# Patient Record
Sex: Female | Born: 1945 | Race: White | Hispanic: No | Marital: Married | Smoking: Former smoker
Health system: Southern US, Community
[De-identification: ages and names within clinical notes are randomized; demographics above are authoritative.]

## PROBLEM LIST (undated history)

## (undated) DIAGNOSIS — Z8639 Personal history of other endocrine, nutritional and metabolic disease: Secondary | ICD-10-CM

## (undated) DIAGNOSIS — N289 Disorder of kidney and ureter, unspecified: Secondary | ICD-10-CM

## (undated) DIAGNOSIS — I219 Acute myocardial infarction, unspecified: Secondary | ICD-10-CM

## (undated) DIAGNOSIS — E119 Type 2 diabetes mellitus without complications: Secondary | ICD-10-CM

## (undated) DIAGNOSIS — D649 Anemia, unspecified: Secondary | ICD-10-CM

## (undated) DIAGNOSIS — J45909 Unspecified asthma, uncomplicated: Secondary | ICD-10-CM

## (undated) DIAGNOSIS — I5022 Chronic systolic (congestive) heart failure: Secondary | ICD-10-CM

## (undated) DIAGNOSIS — Z8679 Personal history of other diseases of the circulatory system: Secondary | ICD-10-CM

## (undated) DIAGNOSIS — I2510 Atherosclerotic heart disease of native coronary artery without angina pectoris: Secondary | ICD-10-CM

## (undated) DIAGNOSIS — Z860100 Personal history of colon polyps, unspecified: Secondary | ICD-10-CM

## (undated) DIAGNOSIS — K069 Disorder of gingiva and edentulous alveolar ridge, unspecified: Secondary | ICD-10-CM

## (undated) DIAGNOSIS — I5042 Chronic combined systolic (congestive) and diastolic (congestive) heart failure: Secondary | ICD-10-CM

## (undated) DIAGNOSIS — K219 Gastro-esophageal reflux disease without esophagitis: Secondary | ICD-10-CM

## (undated) DIAGNOSIS — I255 Ischemic cardiomyopathy: Secondary | ICD-10-CM

## (undated) DIAGNOSIS — I10 Essential (primary) hypertension: Secondary | ICD-10-CM

## (undated) DIAGNOSIS — I739 Peripheral vascular disease, unspecified: Secondary | ICD-10-CM

## (undated) DIAGNOSIS — E785 Hyperlipidemia, unspecified: Secondary | ICD-10-CM

## (undated) DIAGNOSIS — E039 Hypothyroidism, unspecified: Secondary | ICD-10-CM

## (undated) HISTORY — DX: Anemia, unspecified: D64.9

## (undated) HISTORY — PX: PARATHYROIDECTOMY: SHX19

## (undated) HISTORY — DX: Chronic systolic (congestive) heart failure: I50.22

## (undated) HISTORY — DX: Personal history of colon polyps, unspecified: Z86.0100

## (undated) HISTORY — DX: Personal history of other endocrine, nutritional and metabolic disease: Z86.39

## (undated) HISTORY — DX: Peripheral vascular disease, unspecified: I73.9

## (undated) HISTORY — PX: TONSILLECTOMY: SUR1361

## (undated) HISTORY — PX: CARDIAC CATHETERIZATION: SHX172

## (undated) HISTORY — DX: Hyperlipidemia, unspecified: E78.5

## (undated) HISTORY — DX: Personal history of colonic polyps: Z86.010

## (undated) HISTORY — DX: Atherosclerotic heart disease of native coronary artery without angina pectoris: I25.10

## (undated) HISTORY — DX: Acute myocardial infarction, unspecified: I21.9

## (undated) HISTORY — DX: Gastro-esophageal reflux disease without esophagitis: K21.9

## (undated) HISTORY — DX: Disorder of gingiva and edentulous alveolar ridge, unspecified: K06.9

## (undated) HISTORY — PX: THYROIDECTOMY: SHX17

## (undated) HISTORY — DX: Essential (primary) hypertension: I10

## (undated) HISTORY — DX: Personal history of other diseases of the circulatory system: Z86.79

## (undated) HISTORY — PX: CORONARY ARTERY BYPASS GRAFT: SHX141

## (undated) HISTORY — DX: Type 2 diabetes mellitus without complications: E11.9

## (undated) HISTORY — DX: Hypothyroidism, unspecified: E03.9

## (undated) HISTORY — PX: CHOLECYSTECTOMY: SHX55

---

## 2016-03-04 ENCOUNTER — Encounter: Payer: Self-pay | Admitting: Cardiology

## 2016-03-04 ENCOUNTER — Telehealth: Payer: Self-pay | Admitting: Cardiology

## 2016-03-04 ENCOUNTER — Ambulatory Visit (INDEPENDENT_AMBULATORY_CARE_PROVIDER_SITE_OTHER): Payer: Medicare Other | Admitting: Cardiology

## 2016-03-04 ENCOUNTER — Other Ambulatory Visit: Payer: Self-pay | Admitting: Cardiology

## 2016-03-04 VITALS — BP 92/62 | HR 109 | Ht 63.0 in | Wt 172.0 lb

## 2016-03-04 DIAGNOSIS — I4819 Other persistent atrial fibrillation: Secondary | ICD-10-CM

## 2016-03-04 DIAGNOSIS — I739 Peripheral vascular disease, unspecified: Secondary | ICD-10-CM

## 2016-03-04 DIAGNOSIS — I2510 Atherosclerotic heart disease of native coronary artery without angina pectoris: Secondary | ICD-10-CM

## 2016-03-04 DIAGNOSIS — I481 Persistent atrial fibrillation: Secondary | ICD-10-CM

## 2016-03-04 DIAGNOSIS — I255 Ischemic cardiomyopathy: Secondary | ICD-10-CM | POA: Diagnosis not present

## 2016-03-04 DIAGNOSIS — I959 Hypotension, unspecified: Secondary | ICD-10-CM

## 2016-03-04 MED ORDER — AMIODARONE HCL 200 MG PO TABS: ORAL_TABLET | ORAL | 3 refills | Status: DC

## 2016-03-04 NOTE — Telephone Encounter (Signed)
Concerned that side effect was blindness,Dr Diona BrownerMcDowell said it was very remote.Pt agreed to take medication

## 2016-03-04 NOTE — Patient Instructions (Signed)
Your physician recommends that you schedule a follow-up appointment in: 3 weeks with Dr Diona BrownerMcDowell   STOP Norvasc   STOP Aspirin   START Amiodarone  200 mg twice a day for 2 weeks, THEN DECREASE to 200 mg daily      Your physician has requested that you have an echocardiogram. Echocardiography is a painless test that uses sound waves to create images of your heart. It provides your doctor with information about the size and shape of your heart and how well your heart's chambers and valves are working. This procedure takes approximately one hour. There are no restrictions for this procedure.     Thank you for choosing [facility] !

## 2016-03-04 NOTE — Progress Notes (Signed)
Cardiology Office Note  Date: 03/04/2016   ID: [name], DOB Dec 08, 1945, MRN [id number]  PCP: Juliette Alcide, MD  Consulting Cardiologist: Nona Dell, MD   Chief Complaint  Patient presents with  . Palpitations    History of Present Illness: [name] is a medically complex 70 y.o. female referred for cardiology consultation by Dr. Leandrew Koyanagi. She recently established care with him in late July having moved to [place] from [place] to be with family. I do not have complete cardiac records.  She states that over the last 4 or 5 days she has been experiencing palpitations in the mornings and has also noticed that her heart rate is faster than normal, generally around 90-100 range. She reports compliance with her medications. Most recent cardiac regimen includes Norvasc, Coreg, Lasix, and Eliquis. She states that her blood pressure typically runs on the low side.  She has a history of multivessel CAD status post CABG in 2003 after myocardial infarction, and what sounds like an ischemic cardiomyopathy that has been present for years. I do not have information about LVEF at this point. She tells me that she was evaluated for atrial flutter last October at which time she was successfully cardioverted. There was also some discussion about defibrillator placement, but she did not pursue this. CHADSVASC score is 6.  I reviewed her ECG today which shows atrial fibrillation at 102 bpm with PVCs versus aberrantly conducted complexes, also probable lateral infarct pattern and nonspecific ST-T wave changes.  Past Medical History:  Diagnosis Date  . CAD (coronary artery disease)    Multivessel status post CABG in 2003  . Chronic systolic heart failure (HCC)   . Essential hypertension   . GERD (gastroesophageal reflux disease)   . Gum disease   . History of atrial flutter    Cardioversion 2016 in [place]  . History of colonic polyps   . History of goiter   .  Hyperlipidemia   . Hypothyroidism   . Myocardial infarction (HCC)    2003  . Peripheral arterial disease (HCC)    Stent revascularization 2015 - details not clear  . Type 2 diabetes mellitus (HCC)     Past Surgical History:  Procedure Laterality Date  . CHOLECYSTECTOMY     2012  . CORONARY ARTERY BYPASS GRAFT     2003  . PARATHYROIDECTOMY     2015  . THYROIDECTOMY    . TONSILLECTOMY     1967    Current Outpatient Prescriptions  Medication Sig Dispense Refill  . apixaban (ELIQUIS) 5 MG TABS tablet Take 5 mg by mouth 2 (two) times daily.     . carvedilol (COREG) 12.5 MG tablet Take 12.5 mg by mouth 2 (two) times daily with a meal.    . furosemide (LASIX) 20 MG tablet Take 20 mg by mouth daily.     Marland Kitchen levothyroxine (SYNTHROID, LEVOTHROID) 88 MCG tablet Take 88 mcg by mouth daily before breakfast.     . omeprazole (PRILOSEC) 40 MG capsule Take 40 mg by mouth daily.     Marland Kitchen amiodarone (PACERONE) 200 MG tablet Take 200 mg twice a day for 2 weeks, THEN DECREASE to 200 mg daily 90 tablet 3  . nitroGLYCERIN (NITRODUR - DOSED IN MG/24 HR) 0.2 mg/hr patch nitroglycerin 0.2 mg/hr transdermal 24 hour patch transdermal     No current facility-administered medications for this visit.    Allergies:  Oxycodone and Pollen extract   Social History: The patient  reports  that she has quit smoking. Her smoking use included Cigarettes. She has never used smokeless tobacco. She reports that she does not drink alcohol or use drugs.   Family History: The patient's family history includes Heart attack in her father; Stroke in her mother.   ROS:  Please see the history of present illness. Otherwise, complete review of systems is positive for history of claudication, none recently.  All other systems are reviewed and negative.   Physical Exam: VS:  BP 92/62 (BP Location: Left Arm)   Pulse (!) 109   Ht 5\' 3"  (1.6 m)   Wt 172 lb (78 kg)   SpO2 97%   BMI 30.47 kg/m , BMI Body mass index is 30.47  kg/m.  Wt Readings from Last 3 Encounters:  03/04/16 172 lb (78 kg)    General: Overweight woman, appears comfortable at rest. HEENT: Conjunctiva and lids normal, oropharynx clear. Neck: Supple, no elevated JVP or carotid bruits, no thyromegaly. Lungs: Clear to auscultation, nonlabored breathing at rest. Cardiac: Irregularly irregular, no S3, soft systolic murmur, no pericardial rub. Abdomen: Soft, nontender, bowel sounds present, no guarding or rebound. Extremities: No pitting edema, distal pulses 1-2+. Skin: Warm and dry. Musculoskeletal: No kyphosis. Neuropsychiatric: Alert and oriented x3, affect grossly appropriate.  ECG: There is no old tracing for comparison.  Assessment and Plan:  1. Persistent atrial fibrillation, onset uncertain and potentially paroxysmal with symptoms over the last 4 or 5 days. She has a prior history of atrial flutter with cardioversion in October 2016 while in [place]. CHADSVASC score is 6 and she is on Eliquis. We discussed a number of treatment options, and at this point plan will be to initiate amiodarone at 200 mg twice daily for 2 week load and then reduce to once daily. If this does not stabilize her rhythm and atrial fibrillation persists, we can always follow-up with an elective cardioversion. We discussed potential side effects and plan for follow-up down the road. We will also request recent lab work from Dr. Leandrew KoyanagiBurdine. Also asked her to stop aspirin.  2. Multivessel CAD status post CABG in 2003. I do not have details. Patient states that she had records from [place] requested through Dr. Cato MulliganBurdine's office. We will see if these can be obtained.  3. Ischemic cardiomyopathy, not certain about LVEF at this time. Since ICD had been discussed with her previously, I presume that her LVEF was at least in the 35% range. We will obtain an echocardiogram.  4. Hypotension, asymptomatic today. This will likely interfere with efforts to control her rhythm and  augment therapy for her ischemic cardiomyopathy. Plan to stop Norvasc at this point.  5. PAD status post prior reported stent intervention on the left leg. I do not have details. Hopefully this will be in the records obtained from Dr. Leandrew KoyanagiBurdine. We will eventually need to consider lower extremity ABIs and Dopplers.  Current medicines were reviewed with the patient today.   Orders Placed This Encounter  Procedures  . EKG 12-Lead  . ECHOCARDIOGRAM COMPLETE    Disposition: Follow-up with me in 3 weeks.  Signed, Jonelle SidleSamuel G. McDowell, MD, [facility] 03/04/2016 10:20 AM    [facility] at [facility] [address], [place], [place] [address] Phone: [phone]) [phone]; Fax: [phone]

## 2016-03-04 NOTE — Telephone Encounter (Signed)
Mrs. Gasaway is calling because she has some questions and concerns about the medication Amiodarone that was just prescribe for her . Please call   Thanks

## 2016-03-05 ENCOUNTER — Telehealth: Payer: Self-pay

## 2016-03-05 NOTE — Telephone Encounter (Signed)
I called patient to tell her we received her records from [place] and she stated she has not taken Amiodarone. Last nightshe felt she is back in sinus rhythm HR 60's and regular BP normal. Do you need patient to come in for EKG ?

## 2016-03-07 NOTE — Telephone Encounter (Signed)
I would have her do a 7 day monitor to see how frequently she is going in and out of AF. This might help us further decide about antiarrhythmic therapy.

## 2016-03-08 ENCOUNTER — Other Ambulatory Visit: Payer: Self-pay

## 2016-03-08 DIAGNOSIS — I4891 Unspecified atrial fibrillation: Secondary | ICD-10-CM

## 2016-03-08 NOTE — Telephone Encounter (Signed)
I placed order for 7 day event, for afib.I will have [place] enter in e-cardio and call pt

## 2016-03-11 ENCOUNTER — Ambulatory Visit: Payer: Self-pay | Admitting: Cardiology

## 2016-03-15 ENCOUNTER — Telehealth: Payer: Self-pay | Admitting: Cardiology

## 2016-03-15 NOTE — Telephone Encounter (Signed)
Pt called and lvm stating she's received a heart monitor in the mail and she doesn't remember being told anything about having to wear one.

## 2016-03-15 NOTE — Telephone Encounter (Signed)
See previous notes,pt was to wear 7 day event monitor,I was unable to reach her but left voicemail to reassure this is what Dr Diona BrownerMcDowell needs

## 2016-03-16 ENCOUNTER — Ambulatory Visit (HOSPITAL_COMMUNITY)
Admission: RE | Admit: 2016-03-16 | Discharge: 2016-03-16 | Disposition: A | Discharge status: Home / Self Care | Payer: Medicare Other | Source: Ambulatory Visit | Attending: Cardiology | Admitting: Cardiology

## 2016-03-16 ENCOUNTER — Ambulatory Visit (INDEPENDENT_AMBULATORY_CARE_PROVIDER_SITE_OTHER): Payer: Medicare Other

## 2016-03-16 DIAGNOSIS — I252 Old myocardial infarction: Secondary | ICD-10-CM | POA: Diagnosis not present

## 2016-03-16 DIAGNOSIS — I255 Ischemic cardiomyopathy: Secondary | ICD-10-CM | POA: Insufficient documentation

## 2016-03-16 DIAGNOSIS — I481 Persistent atrial fibrillation: Secondary | ICD-10-CM | POA: Diagnosis not present

## 2016-03-16 DIAGNOSIS — I081 Rheumatic disorders of both mitral and tricuspid valves: Secondary | ICD-10-CM | POA: Insufficient documentation

## 2016-03-16 DIAGNOSIS — I10 Essential (primary) hypertension: Secondary | ICD-10-CM | POA: Insufficient documentation

## 2016-03-16 DIAGNOSIS — K219 Gastro-esophageal reflux disease without esophagitis: Secondary | ICD-10-CM | POA: Insufficient documentation

## 2016-03-16 DIAGNOSIS — E785 Hyperlipidemia, unspecified: Secondary | ICD-10-CM | POA: Insufficient documentation

## 2016-03-16 DIAGNOSIS — I4891 Unspecified atrial fibrillation: Secondary | ICD-10-CM

## 2016-03-16 DIAGNOSIS — E119 Type 2 diabetes mellitus without complications: Secondary | ICD-10-CM | POA: Insufficient documentation

## 2016-03-16 DIAGNOSIS — I2510 Atherosclerotic heart disease of native coronary artery without angina pectoris: Secondary | ICD-10-CM | POA: Insufficient documentation

## 2016-03-16 DIAGNOSIS — I4819 Other persistent atrial fibrillation: Secondary | ICD-10-CM

## 2016-03-16 LAB — ECHOCARDIOGRAM COMPLETE
AVLVOTPG: 3 mmHg
CHL CUP DOP CALC LVOT VTI: 21.1 cm
CHL CUP MV DEC (S): 204
CHL CUP TV REG PEAK VELOCITY: 345 cm/s
E decel time: 204 msec
EERAT: 27.6
FS: 20 % — AB (ref 28–44)
IVS/LV PW RATIO, ED: 1.06
LA ID, A-P, ES: 49 mm
LA diam index: 2.59 cm/m2
LA vol index: 35.5 mL/m2
LA vol: 67 mL
LAVOLA4C: 75.8 mL
LEFT ATRIUM END SYS DIAM: 49 mm
LV PW d: 10.5 mm — AB (ref 0.6–1.1)
LV TDI E'LATERAL: 4.13
LV TDI E'MEDIAL: 3.37
LV dias vol index: 50 mL/m2
LV dias vol: 94 mL (ref 46–106)
LV e' LATERAL: 4.13 cm/s
LV sys vol index: 28 mL/m2
LV sys vol: 54 mL — AB (ref 14–42)
LVEEAVG: 27.6
LVEEMED: 27.6
LVOT SV: 66 mL
LVOT area: 3.14 cm2
LVOT diameter: 20 mm
LVOT peak vel: 91.6 cm/s
Lateral S' vel: 8.38 cm/s
MV pk E vel: 114 m/s
MVPG: 5 mmHg
MVPKAVEL: 70.8 m/s
RV sys press: 51 mmHg
Simpson's disk: 43
Stroke v: 40 ml
TAPSE: 16.6 mm
TRMAXVEL: 345 cm/s
VTI: 228 cm

## 2016-03-16 NOTE — Progress Notes (Signed)
*  PRELIMINARY RESULTS* Echocardiogram 2D Echocardiogram has been performed.  Stacey DrainWhite, Jarrell Armond J 03/16/2016, 10:56 AM

## 2016-03-19 ENCOUNTER — Telehealth: Payer: Self-pay

## 2016-03-19 ENCOUNTER — Encounter (HOSPITAL_COMMUNITY): Payer: Self-pay | Admitting: *Deleted

## 2016-03-19 ENCOUNTER — Emergency Department (HOSPITAL_COMMUNITY)
Admission: EM | Admit: 2016-03-19 | Discharge: 2016-03-19 | Disposition: A | Discharge status: Home / Self Care | Payer: Medicare Other | Attending: Emergency Medicine | Admitting: Emergency Medicine

## 2016-03-19 DIAGNOSIS — Z79899 Other long term (current) drug therapy: Secondary | ICD-10-CM | POA: Diagnosis not present

## 2016-03-19 DIAGNOSIS — Z87891 Personal history of nicotine dependence: Secondary | ICD-10-CM | POA: Insufficient documentation

## 2016-03-19 DIAGNOSIS — I110 Hypertensive heart disease with heart failure: Secondary | ICD-10-CM | POA: Diagnosis not present

## 2016-03-19 DIAGNOSIS — I5022 Chronic systolic (congestive) heart failure: Secondary | ICD-10-CM | POA: Insufficient documentation

## 2016-03-19 DIAGNOSIS — M79605 Pain in left leg: Secondary | ICD-10-CM | POA: Insufficient documentation

## 2016-03-19 DIAGNOSIS — I2510 Atherosclerotic heart disease of native coronary artery without angina pectoris: Secondary | ICD-10-CM | POA: Insufficient documentation

## 2016-03-19 DIAGNOSIS — E119 Type 2 diabetes mellitus without complications: Secondary | ICD-10-CM | POA: Diagnosis not present

## 2016-03-19 DIAGNOSIS — E039 Hypothyroidism, unspecified: Secondary | ICD-10-CM | POA: Insufficient documentation

## 2016-03-19 DIAGNOSIS — M79604 Pain in right leg: Secondary | ICD-10-CM | POA: Insufficient documentation

## 2016-03-19 LAB — COMPREHENSIVE METABOLIC PANEL
ALBUMIN: 4.2 g/dL (ref 3.5–5.0)
ALK PHOS: 94 U/L (ref 38–126)
ALT: 13 U/L — ABNORMAL LOW (ref 14–54)
AST: 18 U/L (ref 15–41)
Anion gap: 10 (ref 5–15)
BILIRUBIN TOTAL: 0.8 mg/dL (ref 0.3–1.2)
BUN: 21 mg/dL — AB (ref 6–20)
CALCIUM: 7.7 mg/dL — AB (ref 8.9–10.3)
CO2: 24 mmol/L (ref 22–32)
CREATININE: 0.88 mg/dL (ref 0.44–1.00)
Chloride: 107 mmol/L (ref 101–111)
GFR calc Af Amer: >60 mL/min (ref >60)
GFR calc non Af Amer: >60 mL/min (ref >60)
GLUCOSE: 131 mg/dL — AB (ref 65–99)
Potassium: 3.4 mmol/L — ABNORMAL LOW (ref 3.5–5.1)
Sodium: 141 mmol/L (ref 135–145)
TOTAL PROTEIN: 6.8 g/dL (ref 6.5–8.1)

## 2016-03-19 LAB — MAGNESIUM
Magnesium: 0.8 mg/dL — CL (ref 1.7–2.4)
Magnesium: 1.6 mg/dL — ABNORMAL LOW (ref 1.7–2.4)

## 2016-03-19 LAB — BASIC METABOLIC PANEL
Anion gap: 3 — ABNORMAL LOW (ref 5–15)
BUN: 19 mg/dL (ref 6–20)
CALCIUM: 7.6 mg/dL — AB (ref 8.9–10.3)
CO2: 27 mmol/L (ref 22–32)
CREATININE: 0.81 mg/dL (ref 0.44–1.00)
Chloride: 110 mmol/L (ref 101–111)
GFR calc Af Amer: >60 mL/min (ref >60)
GFR calc non Af Amer: >60 mL/min (ref >60)
GLUCOSE: 113 mg/dL — AB (ref 65–99)
Potassium: 4.4 mmol/L (ref 3.5–5.1)
Sodium: 140 mmol/L (ref 135–145)

## 2016-03-19 LAB — CK: Total CK: 92 U/L (ref 38–234)

## 2016-03-19 MED ORDER — MAGNESIUM SULFATE 2 GM/50ML IV SOLN
2.0000 g | Freq: Once | INTRAVENOUS | Status: AC
  Administered 2016-03-19: 2 g via INTRAVENOUS
  Filled 2016-03-19: qty 50

## 2016-03-19 MED ORDER — POTASSIUM CHLORIDE CRYS ER 20 MEQ PO TBCR
40.0000 meq | EXTENDED_RELEASE_TABLET | Freq: Once | ORAL | Status: AC
  Administered 2016-03-19: 40 meq via ORAL
  Filled 2016-03-19: qty 2

## 2016-03-19 NOTE — ED Provider Notes (Signed)
  [name] is a 70 y.o. female with a PMHx of A-fib, HTN, DM, who presents to the Emergency Department complaining of intermittent bilateral leg and hand cramping onset 2 days. Pt notes that she intermittently takes either 20 or 40 mg of lasix daily. Pt also reports that she is wearing a heart monitor due to palpations that she has been experiencing recently. Pt is having associated symptoms of resolved abdominal pain and resolved diarrhea. She notes that she has not tried any medications for the relief of her symptoms. She denies CP, palpitations, SOB, nausea, dizziness, and any other symptoms. Pt notes that she has had a bypass and leg stent placement. Pt states that she has been on Eliquis since October 2016. Pt [place] the [place]. Denies DVT.    Physical Exam:  Cardiac: RRR Pulm/chest: CTAB Musculoskeletal: No LE edema. No calf tenderness.  Intact DP and PT pulses  Cramps in setting of lasix use.  Check electrolytes. Judicious hydration.  I personally performed the services described in this documentation, which was scribed in my presence. The recorded information has been reviewed and is accurate.      Glynn OctaveStephen Waymond Meador, MD 03/19/16 1755

## 2016-03-19 NOTE — ED Triage Notes (Signed)
Bilateral leg cramping times 2 days.

## 2016-03-19 NOTE — Telephone Encounter (Signed)
Pt called c/o severe cramps in legs and arms,has happened before when she has hypokalemia due to her lasix therapy.Advised to go to ED for evaluation for possible IV replacement,pt agrees

## 2016-03-19 NOTE — ED Provider Notes (Signed)
[facility]-EMER[facility]ENCY DEPT Provider Note   CSN: [id number] Arrival date & time: 03/19/16  1006     History   Chief Complaint Chief Complaint  Patient presents with  . Leg Pain    HPI [name] is a 70 y.o. female.  Patient is a 70 year old female who presents to the emergency department with a complaint of cramping.  The patient states that she takes Lasix on a regular basis. She says that she's been having some discomfort for a couple of weeks, but in the last 2 days she's been having cramping of both legs and at times in both hands. She says that she has had this problem before and her potassium was low. She spoke with her primary physician and they gave her instructions over the phone to come to the emergency department for evaluation. The patient has a history of atrial flutter she has a history of coronary artery disease requiring coronary artery bypass grafting in 2003. The patient also has peripheral artery disease and has required stenting. She is currently on Eliquis. She presents to the emergency department for evaluation concerning her electrolytes, and the cause of her cramping. This problem is moderate worse by stretching and certain movements. It gets some better with rest.      Past Medical History:  Diagnosis Date  . CAD (coronary artery disease)    Multivessel status post CABG in 2003  . Chronic systolic heart failure (HCC)   . Essential hypertension   . GERD (gastroesophageal reflux disease)   . Gum disease   . History of atrial flutter    Cardioversion 2016 in [place]  . History of colonic polyps   . History of goiter   . Hyperlipidemia   . Hypothyroidism   . Myocardial infarction (HCC)    2003  . Peripheral arterial disease (HCC)    Stent revascularization 2015 - details not clear  . Type 2 diabetes mellitus (HCC)     There are no active problems to display for this patient.   Past Surgical History:  Procedure Laterality Date  . CHOLECYSTECTOMY       2012  . CORONARY ARTERY BYPASS GRAFT     2003  . PARATHYROIDECTOMY     2015  . THYROIDECTOMY    . TONSILLECTOMY     1967    OB History    No data available       Home Medications    Prior to Admission medications   Medication Sig Start Date End Date Taking? Authorizing Provider  amiodarone (PACERONE) 200 MG tablet TAKE 1 TABLET BY MOUTH TWICE DAILY FOR 2 WEEKS, THEN DECREASE TO 1 TABLET BY MOUTH DAILY 03/04/16   Jonelle SidleSamuel G McDowell, MD  apixaban (ELIQUIS) 5 MG TABS tablet Take 5 mg by mouth 2 (two) times daily.  01/22/16 04/20/16  Historical Provider, MD  carvedilol (COREG) 12.5 MG tablet Take 12.5 mg by mouth 2 (two) times daily with a meal.    Historical Provider, MD  furosemide (LASIX) 20 MG tablet Take 20 mg by mouth daily.  01/22/16 04/20/16  Historical Provider, MD  levothyroxine (SYNTHROID, LEVOTHROID) 88 MCG tablet Take 88 mcg by mouth daily before breakfast.  01/22/16 04/20/16  Historical Provider, MD  nitroGLYCERIN (NITRODUR - DOSED IN MG/24 HR) 0.2 mg/hr patch nitroglycerin 0.2 mg/hr transdermal 24 hour patch transdermal    Historical Provider, MD  omeprazole (PRILOSEC) 40 MG capsule Take 40 mg by mouth daily.  01/22/16 04/20/16  Historical Provider, MD  Family History Family History  Problem Relation Age of Onset  . Stroke Mother   . Heart attack Father     Social History Social History  Substance Use Topics  . Smoking status: Former Smoker    Types: Cigarettes  . Smokeless tobacco: Never Used  . Alcohol use No     Allergies   Oxycodone; Pollen extract; and Statins   Review of Systems Review of Systems  Constitutional: Negative for activity change.       All ROS Neg except as noted in HPI  HENT: Negative for nosebleeds.   Eyes: Negative for photophobia and discharge.  Respiratory: Negative for cough, shortness of breath and wheezing.   Cardiovascular: Positive for palpitations. Negative for chest pain.  Gastrointestinal: Negative for abdominal pain  and blood in stool.  Genitourinary: Negative for dysuria, frequency and hematuria.  Musculoskeletal: Negative for arthralgias, back pain and neck pain.       Cramping  Skin: Negative.   Neurological: Negative for dizziness, seizures and speech difficulty.  Psychiatric/Behavioral: Negative for confusion and hallucinations.     Physical Exam Updated Vital Signs BP 148/56 (BP Location: Left Arm)   Pulse 71   Temp 98 F (36.7 C) (Oral)   Resp 16   Ht 5\' 3"  (1.6 m)   Wt 78.9 kg   SpO2 93%   BMI 30.82 kg/m   Physical Exam  Constitutional: She is oriented to person, place, and time. She appears well-developed and well-nourished.  Non-toxic appearance.  HENT:  Head: Normocephalic.  Right Ear: Tympanic membrane and external ear normal.  Left Ear: Tympanic membrane and external ear normal.  Eyes: EOM and lids are normal. Pupils are equal, round, and reactive to light.  Neck: Normal range of motion. Neck supple. Carotid bruit is not present.  Cardiovascular: Normal rate, regular rhythm, normal heart sounds, intact distal pulses and normal pulses.   Pulmonary/Chest: Breath sounds normal. No respiratory distress.  Abdominal: Soft. Bowel sounds are normal. There is no tenderness. There is no guarding.  Musculoskeletal: Normal range of motion. She exhibits no edema, tenderness or deformity.  Lymphadenopathy:       Head (right side): No submandibular adenopathy present.       Head (left side): No submandibular adenopathy present.    She has no cervical adenopathy.  Neurological: She is alert and oriented to person, place, and time. She has normal strength. No cranial nerve deficit or sensory deficit.  Skin: Skin is warm and dry.  Psychiatric: She has a normal mood and affect. Her speech is normal.  Nursing note and vitals reviewed.    ED Treatments / Results  Labs (all labs ordered are listed, but only abnormal results are displayed) Labs Reviewed - No data to display  EKG  EKG  Interpretation None       Radiology No results found.  Procedures Procedures (including critical care time)  Medications Ordered in ED Medications - No data to display   Initial Impression / Assessment and Plan / ED Course  Pt seen with me by Dr Manus Gunning.  I have reviewed the triage vital signs and the nursing notes.  Pertinent labs & imaging results that were available during my care of the patient were reviewed by me and considered in my medical decision making (see chart for details).  Clinical Course    **I have reviewed nursing notes, vital signs, and all appropriate lab and imaging results for this patient.*  Final Clinical Impressions(s) / ED Diagnoses  Vital  signs within normal limits.  Magnesium is low at 0.8. Potassium 3.4.  IV magnesium given.  Pt tolerated IV magnesium without problem. Pt ambulatory without problem. Repeat magnesium and bmet ordered  Repeat magnesium is up to 1.6. Patient will be discharged home with magnesium supplement and instructions on foods high in magnesium. I've also discussed with the patient foods that are high in potassium. The patient is to follow-up with her primary physician next week for recheck. Patient is in agreement with this discharge plan.    Final diagnoses:  None    New Prescriptions New Prescriptions   No medications on file     Ivery Quale, PA-C 03/19/16 1503    Glynn Octave, MD 03/19/16 1755

## 2016-03-19 NOTE — Discharge Instructions (Signed)
Your potassium was just barely below normal, your magnesium however was very low. You have received IV magnesium in the emergency department, and your magnesium level is improving. Please observe the information on foods high in magnesium. Please have your magnesium and your potassium rechecked next week by your primary physician. Please return to the emergency department if any unusual cramping, or other problems or concerns.

## 2016-03-19 NOTE — ED Notes (Signed)
CRITICAL VALUE ALERT  Critical value received:  Magnesium 0.8  Date of notification: 03/19/2016  Time of notification: 1102    Nurse who received alert:  Gaetano HawthorneJJ Anise Harbin  MD notified (1st page): Dr. Manus Gunningancour

## 2016-03-19 NOTE — ED Notes (Signed)
Pt ambulated to BR

## 2016-03-19 NOTE — ED Notes (Signed)
Pedal pulses and posterior tibilas pulses ausculated via doppler

## 2016-04-01 ENCOUNTER — Encounter: Payer: Self-pay | Admitting: Cardiology

## 2016-04-01 ENCOUNTER — Ambulatory Visit (INDEPENDENT_AMBULATORY_CARE_PROVIDER_SITE_OTHER): Payer: Medicare Other | Admitting: Cardiology

## 2016-04-01 VITALS — BP 136/68 | HR 56 | Ht 63.0 in | Wt 174.8 lb

## 2016-04-01 DIAGNOSIS — I2510 Atherosclerotic heart disease of native coronary artery without angina pectoris: Secondary | ICD-10-CM

## 2016-04-01 DIAGNOSIS — I255 Ischemic cardiomyopathy: Secondary | ICD-10-CM

## 2016-04-01 DIAGNOSIS — I480 Paroxysmal atrial fibrillation: Secondary | ICD-10-CM | POA: Diagnosis not present

## 2016-04-01 NOTE — Patient Instructions (Signed)
Your physician wants you to follow-up in: 6 months Dr McDowell You will receive a reminder letter in the mail two months in advance. If you don't receive a letter, please call our office to schedule the follow-up appointment.  Your physician recommends that you continue on your current medications as directed. Please refer to the Current Medication list given to you today.    If you need a refill on your cardiac medications before your next appointment, please call your pharmacy.       Thank you for choosing [facility] !         

## 2016-04-01 NOTE — Progress Notes (Signed)
Cardiology Office Note  Date: 04/01/2016   ID: [name], DOB 01-Jul-1945, MRN [id number]  PCP: Curlene Labrum, MD  Primary Cardiologist: Rozann Lesches, MD   Chief Complaint  Patient presents with  . PAF  . Cardiomyopathy    History of Present Illness: [name] is a 70 y.o. female seen as a new patient recently in early September. She presents for a follow-up visit. I did have the opportunity to review her records from [place]. She was in atrial fibrillation when we met, discussed initiation of amiodarone, however she never did actually take the medication. She states that her palpitations ultimately resolved, she was also found to be hypomagnesemic and placed on supplements.  I had her wear a seven-day cardiac monitor to better document frequency of atrial fibrillation. She did not have any sustained atrial fibrillation during the monitoring period and was in sinus rhythm with occasional PACs and rare PVC.  CHADSVASC score is 6 and she continues on Eliquis. He does not report any bleeding problems.  She does state that she feels better at this point back in sinus rhythm. No angina symptoms. We discussed her echocardiogram findings and indications for ICD, she continues to decline this.  Past Medical History:  Diagnosis Date  . CAD (coronary artery disease)    Multivessel status post CABG in 2003  . Chronic systolic heart failure ([place])   . Essential hypertension   . GERD (gastroesophageal reflux disease)   . Gum disease   . History of atrial flutter    Cardioversion 2016 in [place]  . History of colonic polyps   . History of goiter   . Hyperlipidemia   . Hypothyroidism   . Myocardial infarction    2003  . Peripheral arterial disease ([place])    Stent revascularization 2015 - details not clear  . Type 2 diabetes mellitus ([place])     Current Outpatient Prescriptions  Medication Sig Dispense Refill  . acetaminophen (TYLENOL) 500 MG tablet Take 1,000 mg by  mouth every 6 (six) hours as needed for moderate pain.    Marland Kitchen apixaban (ELIQUIS) 5 MG TABS tablet Take 5 mg by mouth 2 (two) times daily.     . carvedilol (COREG) 12.5 MG tablet Take 12.5 mg by mouth 2 (two) times daily with a meal.    . furosemide (LASIX) 20 MG tablet Take 20 mg by mouth daily.     Marland Kitchen levothyroxine (SYNTHROID, LEVOTHROID) 100 MCG tablet TK 1 T PO D ALONG WITH 88MCG T FOR A TOTAL DOSE OF 188MCG D  3  . levothyroxine (SYNTHROID, LEVOTHROID) 88 MCG tablet Take 88 mcg by mouth daily before breakfast.     . nitroGLYCERIN (NITRODUR - DOSED IN MG/24 HR) 0.2 mg/hr patch nitroglycerin 0.2 mg/hr transdermal 24 hour patch transdermal    . omeprazole (PRILOSEC) 40 MG capsule Take 40 mg by mouth at bedtime.      No current facility-administered medications for this visit.    Allergies:  Oxycodone; Pollen extract; and Statins   Social History: The patient  reports that she has quit smoking. Her smoking use included Cigarettes. She has never used smokeless tobacco. She reports that she does not drink alcohol or use drugs.   ROS:  Please see the history of present illness. Otherwise, complete review of systems is positive for occasional leg cramps.  All other systems are reviewed and negative.   Physical Exam: VS:  BP 136/68   Pulse (!) 56   Ht 5'  3" (1.6 m)   Wt 174 lb 12.8 oz (79.3 kg)   SpO2 94%   BMI 30.96 kg/m , BMI Body mass index is 30.96 kg/m.  Wt Readings from Last 3 Encounters:  04/01/16 174 lb 12.8 oz (79.3 kg)  03/19/16 174 lb (78.9 kg)  03/04/16 172 lb (78 kg)    General: Overweight woman, appears comfortable at rest. HEENT: Conjunctiva and lids normal, oropharynx clear. Neck: Supple, no elevated JVP or carotid bruits, no thyromegaly. Lungs: Clear to auscultation, nonlabored breathing at rest. Cardiac: RRR, no S3, soft systolic murmur, no pericardial rub. Abdomen: Soft, nontender, bowel sounds present, no guarding or rebound. Extremities: No pitting edema, distal  pulses 1-2+.  ECG: I personally reviewed the tracing from 03/22/2016 which showed sinus rhythm with anteroseptal Q waves and prolonged QTc.  Recent Labwork: 03/19/2016: ALT 13; AST 18; BUN 19; Creatinine, Ser 0.81; Magnesium 1.6; Potassium 4.4; Sodium 140   Other Studies Reviewed Today:  Echocardiogram 03/16/2016: Study Conclusions  - Left ventricle: The cavity size was normal. Wall thickness was   increased in a pattern of mild LVH. Systolic function was   moderately reduced. The estimated ejection fraction was in the   range of 35% to 40%. The apex is poorly visualized in the apical   views, but appears to be hypokinetic. Can consider contrast study   if further evaluation needed. Features are consistent with a   pseudonormal left ventricular filling pattern, with concomitant   abnormal relaxation and increased filling pressure (grade 2   diastolic dysfunction). Doppler parameters are consistent with   high ventricular filling pressure. - Aortic valve: Valve area (VTI): 1.76 cm^2. Valve area (Vmax):   1.95 cm^2. Valve area (Vmean): 1.84 cm^2. - Mitral valve: There was mild regurgitation. - Left atrium: The atrium was severely dilated. - Right ventricle: Systolic function was mildly reduced. - Right atrium: The atrium was mildly dilated. - Pulmonary arteries: Systolic pressure was moderately increased.   PA peak pressure: 56 mm Hg (S). - Technically adequate study.  Lexiscan Myoview 11/30/2014 ([place]): Medium to large region of scar in the apical septal, apical inferior, apical lateral, and apex with mild peri-infarct ischemia. Medium region of scar with mild amount of peri-infarct ischemia also noted in the basal inferolateral and mid inferolateral walls. LVEF 35%.  Lower extremity ABIs 09/12/2014 ([place]): Left side 1.16 posterior tibial and 1.05 dorsalis pedis. Right side 1.0 posterior tibial and 0.97 dorsalis pedis. After exercise left side decreases  to 0.91 and right side decreases to 0.83.  Assessment and Plan:  1. Paroxysmal atrial fibrillation, currently in sinus rhythm. She has a CHADSVASC score of 6, she continues on Eliquis for stroke prophylaxis. She was not comfortable starting amiodarone and never did. We will continue with observation on Coreg.  2. Ischemic cardiomyopathy with LVEF 35-40%. She has declined ICD. Weight is stable, NYHA class II dyspnea. Currently on Coreg, consider addition of ARB (she was taken off Norvasc at the last visit due to low blood pressure however).  3. Multivessel CAD status post CABG in 2003. Myoview from last year is outlined above. She is not reporting any active angina at this time.  Current medicines were reviewed with the patient today.  Disposition: Follow-up with me in 6 months.  Signed, Satira Sark, MD, [facility] 04/01/2016 2:13 PM    [facility] at [facility] [address] [place], [place] [address] Phone: [phone]; Fax: [phone]

## 2016-08-18 ENCOUNTER — Emergency Department (HOSPITAL_COMMUNITY)
Admission: EM | Admit: 2016-08-18 | Discharge: 2016-08-18 | Disposition: A | Discharge status: Home / Self Care | Payer: Medicare Other | Attending: Emergency Medicine | Admitting: Emergency Medicine

## 2016-08-18 ENCOUNTER — Encounter (HOSPITAL_COMMUNITY): Payer: Self-pay | Admitting: Emergency Medicine

## 2016-08-18 DIAGNOSIS — I25810 Atherosclerosis of coronary artery bypass graft(s) without angina pectoris: Secondary | ICD-10-CM | POA: Insufficient documentation

## 2016-08-18 DIAGNOSIS — I5022 Chronic systolic (congestive) heart failure: Secondary | ICD-10-CM | POA: Insufficient documentation

## 2016-08-18 DIAGNOSIS — E119 Type 2 diabetes mellitus without complications: Secondary | ICD-10-CM | POA: Insufficient documentation

## 2016-08-18 DIAGNOSIS — Z951 Presence of aortocoronary bypass graft: Secondary | ICD-10-CM | POA: Diagnosis not present

## 2016-08-18 DIAGNOSIS — E039 Hypothyroidism, unspecified: Secondary | ICD-10-CM | POA: Diagnosis not present

## 2016-08-18 DIAGNOSIS — Z79899 Other long term (current) drug therapy: Secondary | ICD-10-CM | POA: Insufficient documentation

## 2016-08-18 DIAGNOSIS — I110 Hypertensive heart disease with heart failure: Secondary | ICD-10-CM | POA: Insufficient documentation

## 2016-08-18 DIAGNOSIS — Z87891 Personal history of nicotine dependence: Secondary | ICD-10-CM | POA: Diagnosis not present

## 2016-08-18 DIAGNOSIS — R252 Cramp and spasm: Secondary | ICD-10-CM | POA: Diagnosis present

## 2016-08-18 LAB — BASIC METABOLIC PANEL
Anion gap: 10 (ref 5–15)
BUN: 17 mg/dL (ref 6–20)
CALCIUM: 7.4 mg/dL — AB (ref 8.9–10.3)
CO2: 24 mmol/L (ref 22–32)
CREATININE: 0.93 mg/dL (ref 0.44–1.00)
Chloride: 107 mmol/L (ref 101–111)
GFR calc non Af Amer: >60 mL/min (ref >60)
Glucose, Bld: 102 mg/dL — ABNORMAL HIGH (ref 65–99)
Potassium: 3.5 mmol/L (ref 3.5–5.1)
SODIUM: 141 mmol/L (ref 135–145)

## 2016-08-18 LAB — CBC WITH DIFFERENTIAL/PLATELET
BASOS ABS: 0 10*3/uL (ref 0.0–0.1)
Basophils Relative: 0 %
EOS ABS: 0.1 10*3/uL (ref 0.0–0.7)
Eosinophils Relative: 1 %
HCT: 34.4 % — ABNORMAL LOW (ref 36.0–46.0)
Hemoglobin: 11.5 g/dL — ABNORMAL LOW (ref 12.0–15.0)
LYMPHS ABS: 1.8 10*3/uL (ref 0.7–4.0)
Lymphocytes Relative: 29 %
MCH: 30.3 pg (ref 26.0–34.0)
MCHC: 33.4 g/dL (ref 30.0–36.0)
MCV: 90.5 fL (ref 78.0–100.0)
Monocytes Absolute: 0.4 10*3/uL (ref 0.1–1.0)
Monocytes Relative: 7 %
NEUTROS ABS: 3.8 10*3/uL (ref 1.7–7.7)
Neutrophils Relative %: 63 %
Platelets: 223 10*3/uL (ref 150–400)
RBC: 3.8 MIL/uL — ABNORMAL LOW (ref 3.87–5.11)
RDW: 15.6 % — AB (ref 11.5–15.5)
WBC: 6.1 10*3/uL (ref 4.0–10.5)

## 2016-08-18 LAB — MAGNESIUM: MAGNESIUM: 0.5 mg/dL — AB (ref 1.7–2.4)

## 2016-08-18 MED ORDER — MAGNESIUM CHLORIDE 64 MG PO TBEC: 2.0000 | DELAYED_RELEASE_TABLET | Freq: Two times a day (BID) | ORAL | 0 refills | Status: DC

## 2016-08-18 MED ORDER — MAGNESIUM OXIDE 400 (241.3 MG) MG PO TABS
400.0000 mg | ORAL_TABLET | Freq: Once | ORAL | Status: AC
  Administered 2016-08-18: 400 mg via ORAL
  Filled 2016-08-18: qty 1

## 2016-08-18 MED ORDER — MAGNESIUM OXIDE 400 (241.3 MG) MG PO TABS
ORAL_TABLET | ORAL | Status: AC
  Filled 2016-08-18: qty 1

## 2016-08-18 MED ORDER — MAGNESIUM SULFATE 2 GM/50ML IV SOLN
2.0000 g | Freq: Once | INTRAVENOUS | Status: AC
  Administered 2016-08-18: 2 g via INTRAVENOUS
  Filled 2016-08-18: qty 50

## 2016-08-18 NOTE — ED Notes (Addendum)
CRITICAL VALUE ALERT  Critical value received:  Magnesium 0.5  Date of notification: 08/18/16    Time of notification:  1521  Critical value read back:Yes.    Nurse who received alert:  Tilman NeatJennifer Saintclair Schroader,RN  MD notified (1st page):    Time of first page:  1521  MD notified (2nd page):  Time of second page:1521  Responding MD:  Dr. Effie ShyWentz  Time MD responded:  [phone]

## 2016-08-18 NOTE — ED Triage Notes (Signed)
Patient complaining of cramping to bilateral hands, feet, and leg x 1 week. States "last time this happened my magnesium was really low so I called Dr Leandrew [place]Burdine [place] and he told me to come to the ER."

## 2016-08-18 NOTE — Discharge Instructions (Signed)
Plenty of rest and drink a lot of fluids.  See your doctor in 1 week for repeat magnesium test.

## 2016-08-18 NOTE — ED Provider Notes (Signed)
[facility]-EMERGENCY DEPT Provider Note   CSN: [id number] Arrival date & time: 08/18/16  1337     History   Chief Complaint Chief Complaint  Patient presents with  . Joint Pain    HPI [name] is a 71 y.o. female.  She presents for evaluation of cramping in hands intermittently.  This is been present for several days.  It followed an episode of "upper respiratory infection" characterized by coughing and fever for several days last week.  She has had some diarrhea since then as well.  She is taking her usual medications.  She had a similar episode several years ago but does not chronically take magnesium.  There are no other known modifying factors.  HPI  Past Medical History:  Diagnosis Date  . CAD (coronary artery disease)    Multivessel status post CABG in 2003  . Chronic systolic heart failure (H[facility])   . Essential hypertension   . GERD (gastroesophageal reflux disease)   . Gum disease   . History of atrial flutter    Cardioversion 2016 in [place]  . History of colonic polyps   . History of goiter   . Hyperlipidemia   . Hypothyroidism   . Myocardial infarction    2003  . Peripheral arterial disease (H[facility])    Stent revascularization 2015 - details not clear  . Type 2 diabetes mellitus (H[facility])     There are no active problems to display for this patient.   Past Surgical History:  Procedure Laterality Date  . CHOLECYSTECTOMY     2012  . CORONARY ARTERY BYPASS GRAFT     2003  . PARATHYROIDECTOMY     2015  . THYROIDECTOMY    . TONSILLECTOMY     1967    OB History    No data available       Home Medications    Prior to Admission medications   Medication Sig Start Date End Date Taking? Authorizing Provider  acetaminophen (TYLENOL) 500 MG tablet Take 1,000 mg by mouth every 6 (six) hours as needed for moderate pain.   Yes Historical Provider, MD  apixaban (ELIQUIS) 5 MG TABS tablet Take 5 mg by mouth 2 (two) times daily.  01/22/16 08/18/16 Yes Historical  Provider, MD  carvedilol (COREG) 12.5 MG tablet Take 12.5 mg by mouth 2 (two) times daily with a meal.   Yes Historical Provider, MD  Cholecalciferol (VITAMIN D3 PO) Take 1 tablet by mouth daily.   Yes Historical Provider, MD  furosemide (LASIX) 20 MG tablet Take 20-40 mg by mouth daily. Alternating 20 mg and 40 mg every other day. 01/22/16 08/18/16 Yes Historical Provider, MD  levothyroxine (SYNTHROID, LEVOTHROID) 100 MCG tablet TK 1 T PO D ALONG WITH T FOR A TOTAL DOSE OF D 03/24/16  Yes Historical Provider, MD  nitroGLYCERIN (NITRODUR - DOSED IN MG/24 HR) 0.2 mg/hr patch Place 0.2 mg onto the skin daily as needed (chest pains).   Yes Historical Provider, MD  omeprazole (PRILOSEC) 40 MG capsule Take 40 mg by mouth daily.  01/22/16 08/18/16 Yes Historical Provider, MD  magnesium chloride (SLOW-MAG) 64 MG TBEC SR tablet Take 2 tablets (128 mg total) by mouth 2 (two) times daily. 08/18/16   Mancel Bale, MD    Family History Family History  Problem Relation Age of Onset  . Stroke Mother   . Heart attack Father     Social History Social History  Substance Use Topics  . Smoking status: Former Smoker  Types: Cigarettes  . Smokeless tobacco: Never Used  . Alcohol use No     Allergies   Oxycodone; Pollen extract; and Statins   Review of Systems Review of Systems  All other systems reviewed and are negative.    Physical Exam Updated Vital Signs BP 128/60 (BP Location: Right Arm)   Pulse 60   Temp 98.4 F (36.9 C) (Oral)   Resp 18   Ht 5\' 3"  (1.6 m)   Wt 183 lb (83 kg)   SpO2 99%   BMI 32.42 kg/m   Physical Exam  Constitutional: She is oriented to person, place, and time. She appears well-developed and well-nourished. No distress.  HENT:  Head: Normocephalic and atraumatic.  Eyes: Conjunctivae and EOM are normal. Pupils are equal, round, and reactive to light.  Neck: Normal range of motion and phonation normal. Neck supple.  Cardiovascular: Normal rate and  regular rhythm.   Pulmonary/Chest: Effort normal and breath sounds normal. She exhibits no tenderness.  Abdominal: Soft. She exhibits no distension. There is no tenderness. There is no guarding.  Musculoskeletal: Normal range of motion. She exhibits no tenderness or deformity.  Neurological: She is alert and oriented to person, place, and time. She exhibits normal muscle tone.  No dysarthria or aphasia or nystagmus.  No pronator drift.  Skin: Skin is warm and dry.  Psychiatric: She has a normal mood and affect. Her behavior is normal. Judgment and thought content normal.  Nursing note and vitals reviewed.    ED Treatments / Results  Labs (all labs ordered are listed, but only abnormal results are displayed) Labs Reviewed  CBC WITH DIFFERENTIAL/PLATELET - Abnormal; Notable for the following:       Result Value   RBC 3.80 (*)    Hemoglobin 11.5 (*)    HCT 34.4 (*)    RDW 15.6 (*)    All other components within normal limits  BASIC METABOLIC PANEL - Abnormal; Notable for the following:    Glucose, Bld 102 (*)    Calcium 7.4 (*)    All other components within normal limits  MAGNESIUM - Abnormal; Notable for the following:    Magnesium 0.5 (*)    All other components within normal limits    EKG  EKG Interpretation None       Radiology No results found.  Procedures Procedures (including critical care time)  Medications Ordered in ED Medications  magnesium sulfate IVPB 2 g 50 mL (0 g Intravenous Stopped 08/18/16 1817)  magnesium oxide (MAG-OX) tablet 400 mg (400 mg Oral Given 08/18/16 1809)     Initial Impression / Assessment and Plan / ED Course  I have reviewed the triage vital signs and the nursing notes.  Pertinent labs & imaging results that were available during my care of the patient were reviewed by me and considered in my medical decision making (see chart for details).     Medications  magnesium sulfate IVPB 2 g 50 mL (0 g Intravenous Stopped 08/18/16 1817)   magnesium oxide (MAG-OX) tablet 400 mg (400 mg Oral Given 08/18/16 1809)    No data found.   At D/C Reevaluation with update and discussion. After initial assessment and treatment, an updated evaluation reveals cramping resolved. Ambulates easily. Findings discussed. Breeanna Galgano L    EKG Interpretation None       Final Clinical Impressions(s) / ED Diagnoses   Final diagnoses:  Hypomagnesemia   Low Mag like;y multifactorial, meds and nutrition.Doubt metabolic instability or impending vascular collapse.  Nursing Notes Reviewed/ Care Coordinated Applicable Imaging Reviewed Interpretation of Laboratory Data incorporated into ED treatment  The patient appears reasonably screened and/or stabilized for discharge and I doubt any other medical condition or other [facility] requiring further screening, evaluation, or treatment in the ED at this time prior to discharge.  Plan: Home Medications- continue; Home Treatments- rest; return here if the recommended treatment, does not improve the symptoms; Recommended follow up- PCP prn  New Prescriptions Discharge Medication List as of 08/18/2016  7:17 PM    START taking these medications   Details  magnesium chloride (SLOW-MAG) 64 MG TBEC SR tablet Take 2 tablets (128 mg total) by mouth 2 (two) times daily., Starting Wed 08/18/2016, Print         Mancel BaleElliott Gilma Bessette, MD 08/19/16 1950

## 2016-08-20 ENCOUNTER — Encounter: Payer: Self-pay | Admitting: Cardiology

## 2016-10-07 ENCOUNTER — Ambulatory Visit: Payer: Medicare Other | Admitting: Cardiology

## 2016-10-07 NOTE — Progress Notes (Deleted)
Cardiology Office Note  Date: 10/07/2016   ID: [name], DOB 07-10-1945, MRN [id number]  PCP: Juliette Alcide, MD  Primary Cardiologist: Nona Dell, MD   No chief complaint on file.   History of Present Illness: [name] is a 71 y.o. female last seen in October 2017.  CHADSVASC score is 6, she remains on Eliquis.  Past Medical History:  Diagnosis Date  . CAD (coronary artery disease)    Multivessel status post CABG in 2003  . Chronic systolic heart failure (HCC)   . Essential hypertension   . GERD (gastroesophageal reflux disease)   . Gum disease   . History of atrial flutter    Cardioversion 2016 in [place]  . History of colonic polyps   . History of goiter   . Hyperlipidemia   . Hypothyroidism   . Myocardial infarction    2003  . Peripheral arterial disease (H[facility])    Stent revascularization 2015 - details not clear  . Type 2 diabetes mellitus (H[facility])     Past Surgical History:  Procedure Laterality Date  . CHOLECYSTECTOMY     2012  . CORONARY ARTERY BYPASS GRAFT     2003  . PARATHYROIDECTOMY     2015  . THYROIDECTOMY    . TONSILLECTOMY     1967    Current Outpatient Prescriptions  Medication Sig Dispense Refill  . acetaminophen (TYLENOL) 500 MG tablet Take 1,000 mg by mouth every 6 (six) hours as needed for moderate pain.    Marland Kitchen apixaban (ELIQUIS) 5 MG TABS tablet Take 5 mg by mouth 2 (two) times daily.     . carvedilol (COREG) 12.5 MG tablet Take 12.5 mg by mouth 2 (two) times daily with a meal.    . Cholecalciferol (VITAMIN D3 PO) Take 1 tablet by mouth daily.    . furosemide (LASIX) 20 MG tablet Take 20-40 mg by mouth daily. Alternating 20 mg and 40 mg every other day.    . levothyroxine (SYNTHROID, LEVOTHROID) 100 MCG tablet TK 1 T PO D ALONG WITH T FOR A TOTAL DOSE OF D  3  . magnesium chloride (SLOW-MAG) 64 MG TBEC SR tablet Take 2 tablets (128 mg total) by mouth 2 (two) times daily. 60 tablet 0  . nitroGLYCERIN  (NITRODUR - DOSED IN MG/24 HR) 0.2 mg/hr patch Place 0.2 mg onto the skin daily as needed (chest pains).    Marland Kitchen omeprazole (PRILOSEC) 40 MG capsule Take 40 mg by mouth daily.      No current facility-administered medications for this visit.    Allergies:  Oxycodone; Pollen extract; and Statins   Social History: The patient  reports that she has quit smoking. Her smoking use included Cigarettes. She has never used smokeless tobacco. She reports that she does not drink alcohol or use drugs.   Family History: The patient's family history includes Heart attack in her father; Stroke in her mother.   ROS:  Please see the history of present illness. Otherwise, complete review of systems is positive for {NONE DEFAULTED:18576::"none"}.  All other systems are reviewed and negative.   Physical Exam: VS:  There were no vitals taken for this visit., BMI There is no height or weight on file to calculate BMI.  Wt Readings from Last 3 Encounters:  08/18/16 183 lb (83 kg)  04/01/16 174 lb 12.8 oz (79.3 kg)  03/19/16 174 lb (78.9 kg)    General: Overweight woman,appears comfortable at rest. HEENT: Conjunctiva and lids  normal, oropharynx clear. Neck: Supple, no elevated JVP or carotid bruits, no thyromegaly. Lungs: Clear to auscultation, nonlabored breathing at rest. Cardiac: RRR,no S3, softsystolic murmur, no pericardial rub. Abdomen: Soft, nontender, bowel sounds present, no guarding or rebound. Extremities: No pitting edema, distal pulses 1-2+.  ECG: I personally reviewed the tracing from 03/19/2016 which showed sinus rhythm with nonspecific ST-T changes.  Recent Labwork: 03/19/2016: ALT 13; AST 18 08/18/2016: BUN 17; Creatinine, Ser 0.93; Hemoglobin 11.5; Magnesium 0.5; Platelets 223; Potassium 3.5; Sodium 141   Other Studies Reviewed Today:  Echocardiogram 03/16/2016: Study Conclusions  - Left ventricle: The cavity size was normal. Wall thickness was increased in a pattern of mild LVH.  Systolic function was moderately reduced. The estimated ejection fraction was in the range of 35% to 40%. The apex is poorly visualized in the apical views, but appears to be hypokinetic. Can consider contrast study if further evaluation needed. Features are consistent with a pseudonormal left ventricular filling pattern, with concomitant abnormal relaxation and increased filling pressure (grade 2 diastolic dysfunction). Doppler parameters are consistent with high ventricular filling pressure. - Aortic valve: Valve area (VTI): 1.76 cm^2. Valve area (Vmax): 1.95 cm^2. Valve area (Vmean): 1.84 cm^2. - Mitral valve: There was mild regurgitation. - Left atrium: The atrium was severely dilated. - Right ventricle: Systolic function was mildly reduced. - Right atrium: The atrium was mildly dilated. - Pulmonary arteries: Systolic pressure was moderately increased. PA peak pressure: 56 mm Hg (S). - Technically adequate study.  Lexiscan Myoview 11/30/2014 [facility] [place]): Medium to large region of scar in the apical septal, apical inferior, apical lateral, and apex with mild peri-infarct ischemia. Medium region of scar with mild amount of peri-infarct ischemia also noted in the basal inferolateral and mid inferolateral walls. LVEF 35%.  Assessment and Plan:    Current medicines were reviewed with the patient today.  No orders of the defined types were placed in this encounter.   Disposition:  Signed, Jonelle Sidle, MD, [facility] 10/07/2016 7:52 AM    [facility] at [facility] [address] [place], [place], [place] [address] Phone: [phone]; Fax: [phone]

## 2016-10-08 ENCOUNTER — Ambulatory Visit (INDEPENDENT_AMBULATORY_CARE_PROVIDER_SITE_OTHER): Payer: Medicare Other | Admitting: Cardiology

## 2016-10-08 ENCOUNTER — Encounter: Payer: Self-pay | Admitting: Cardiology

## 2016-10-08 VITALS — BP 142/70 | HR 72 | Ht 63.0 in | Wt 186.0 lb

## 2016-10-08 DIAGNOSIS — I2510 Atherosclerotic heart disease of native coronary artery without angina pectoris: Secondary | ICD-10-CM

## 2016-10-08 DIAGNOSIS — I10 Essential (primary) hypertension: Secondary | ICD-10-CM | POA: Diagnosis not present

## 2016-10-08 DIAGNOSIS — I255 Ischemic cardiomyopathy: Secondary | ICD-10-CM | POA: Diagnosis not present

## 2016-10-08 DIAGNOSIS — I480 Paroxysmal atrial fibrillation: Secondary | ICD-10-CM

## 2016-10-08 MED ORDER — APIXABAN 5 MG PO TABS: 5.0000 mg | ORAL_TABLET | Freq: Two times a day (BID) | ORAL | 8 refills | Status: AC

## 2016-10-08 NOTE — Progress Notes (Signed)
Cardiology Office Note  Date: 10/08/2016   ID: [name], DOB 05-Aug-1945, MRN [id number]  PCP: Juliette Alcide, MD  Primary Cardiologist: Nona Dell, MD   Chief Complaint  Patient presents with  . PAF    History of Present Illness: [name] is a 71 y.o. female last seen in October 2017. She presents for a routine follow-up visit. Reports no significant palpitations or angina progression since last encounter. She states that she has been compliant with her medications.  CHADSVASC score is 6 and she continues on Eliquis. Reports some difficulty affording the medication. We have checked on samples for her.  LVEF 35-40% by most recent assessment. She has declined ICD and continues on medical therapy. No evidence of volume overload. She reports NYHA class II dyspnea with most activities.  I did review her most recent lab work from February which is outlined below.  Past Medical History:  Diagnosis Date  . CAD (coronary artery disease)    Multivessel status post CABG in 2003  . Chronic systolic heart failure (HCC)   . Essential hypertension   . GERD (gastroesophageal reflux disease)   . Gum disease   . History of atrial flutter    Cardioversion 2016 in [place]  . History of colonic polyps   . History of goiter   . Hyperlipidemia   . Hypothyroidism   . Myocardial infarction    2003  . Peripheral arterial disease (HCC)    Stent revascularization 2015 - details not clear  . Type 2 diabetes mellitus (HCC)     Past Surgical History:  Procedure Laterality Date  . CHOLECYSTECTOMY     2012  . CORONARY ARTERY BYPASS GRAFT     2003  . PARATHYROIDECTOMY     2015  . THYROIDECTOMY    . TONSILLECTOMY     1967    Current Outpatient Prescriptions  Medication Sig Dispense Refill  . acetaminophen (TYLENOL) 500 MG tablet Take 1,000 mg by mouth every 6 (six) hours as needed for moderate pain.    Marland Kitchen albuterol (PROVENTIL HFA;VENTOLIN HFA) 108 (90 Base) MCG/ACT  inhaler Inhale 1 puff into the lungs every 6 (six) hours as needed for wheezing or shortness of breath.    Marland Kitchen apixaban (ELIQUIS) 5 MG TABS tablet Take 5 mg by mouth 2 (two) times daily.     . carvedilol (COREG) 12.5 MG tablet Take 12.5 mg by mouth 2 (two) times daily with a meal.    . Cholecalciferol (VITAMIN D3 PO) Take 1 tablet by mouth daily.    . furosemide (LASIX) 20 MG tablet Take 20-40 mg by mouth daily. Alternating 20 mg and 40 mg every other day.    . levothyroxine (SYNTHROID, LEVOTHROID) 125 MCG tablet Take 125 mcg by mouth daily before breakfast.    . Magnesium 250 MG TABS Take 250 mg by mouth 2 (two) times daily.    . nitroGLYCERIN (NITRODUR - DOSED IN MG/24 HR) 0.2 mg/hr patch Place 0.2 mg onto the skin daily as needed (chest pains).    Marland Kitchen omeprazole (PRILOSEC) 40 MG capsule Take 40 mg by mouth daily.      No current facility-administered medications for this visit.    Allergies:  Oxycodone; Pollen extract; and Statins   Social History: The patient  reports that she has quit smoking. Her smoking use included Cigarettes. She has never used smokeless tobacco. She reports that she does not drink alcohol or use drugs.   ROS:  Please see the  history of present illness. Otherwise, complete review of systems is positive for none.  All other systems are reviewed and negative.   Physical Exam: VS:  BP (!) 142/70   Pulse 72   Ht  (1.6 m)   Wt 186 lb (84.4 kg)   SpO2 96%   BMI 32.95 kg/m , BMI Body mass index is 32.95 kg/m.  Wt Readings from Last 3 Encounters:  10/08/16 186 lb (84.4 kg)  08/18/16 183 lb (83 kg)  04/01/16 174 lb 12.8 oz (79.3 kg)    General: Overweight woman,appears comfortable at rest. HEENT: Conjunctiva and lids normal, oropharynx clear. Neck: Supple, no elevated JVP or carotid bruits, no thyromegaly. Lungs: Clear to auscultation, nonlabored breathing at rest. Cardiac: RRR,no S3, softsystolic murmur, no pericardial rub. Abdomen: Soft, nontender, bowel  sounds present, no guarding or rebound. Extremities: No pitting edema, distal pulses 1-2+. Skin: Warm and dry. Musculoskeletal: No kyphosis. Neuropsychiatric: Alert and oriented 3, affect appropriate.  ECG: I personally reviewed the tracing from 03/22/2016 which showed sinus rhythm with anteroseptal Q waves and prolonged QTc.  Recent Labwork: 03/19/2016: ALT 13; AST 18 08/18/2016: BUN 17; Creatinine, Ser 0.93; Hemoglobin 11.5; Magnesium 0.5; Platelets 223; Potassium 3.5; Sodium 141   Other Studies Reviewed Today:  Echocardiogram 03/16/2016: Study Conclusions  - Left ventricle: The cavity size was normal. Wall thickness was increased in a pattern of mild LVH. Systolic function was moderately reduced. The estimated ejection fraction was in the range of 35% to 40%. The apex is poorly visualized in the apical views, but appears to be hypokinetic. Can consider contrast study if further evaluation needed. Features are consistent with a pseudonormal left ventricular filling pattern, with concomitant abnormal relaxation and increased filling pressure (grade 2 diastolic dysfunction). Doppler parameters are consistent with high ventricular filling pressure. - Aortic valve: Valve area (VTI): 1.76 cm^2. Valve area (Vmax): 1.95 cm^2. Valve area (Vmean): 1.84 cm^2. - Mitral valve: There was mild regurgitation. - Left atrium: The atrium was severely dilated. - Right ventricle: Systolic function was mildly reduced. - Right atrium: The atrium was mildly dilated. - Pulmonary arteries: Systolic pressure was moderately increased. PA peak pressure: 56 mm Hg (S). - Technically adequate study.  Lexiscan Myoview 11/30/2014 [facility] [place]): Medium to large region of scar in the apical septal, apical inferior, apical lateral, and apex with mild peri-infarct ischemia. Medium region of scar with mild amount of peri-infarct ischemia also noted in the basal inferolateral and mid  inferolateral walls. LVEF 35%.  Assessment and Plan:  1.  Multivessel CAD status post CABG in 2003. She had a follow-up Myoview study in June 2016 back in [place] as detailed above. In the absence of progressive angina we will continue with medical therapy and observation.  2. Paroxysmal atrial fibrillation, symptomatically stable in terms of recurrent palpitations. She declines antiarrhythmic therapy and we will continue with Eliquis for stroke prophylaxis and Coreg.  3. Ischemic cardiomyopathy with LVEF 35-40% by follow-up echocardiogram last year. She has declined ICD. No evidence of volume overload. No syncope.  4. Essential hypertension, systolic blood pressure 140s today. Keep follow-up with Dr. Leandrew Koyanagi.  Current medicines were reviewed with the patient today.  Disposition: Follow-up in 6 months.  Signed, Jonelle Sidle, MD, [facility] 10/08/2016 1:31 PM    [facility] [address] [place], [place], [place] [address] Phone: [phone]; Fax: [phone]

## 2016-10-08 NOTE — Patient Instructions (Signed)
Medication Instructions:  Your physician recommends that you continue on your current medications as directed. Please refer to the Current Medication list given to you today.  Labwork: REQUESTING RECENT LAB RESULTS FROM YOUR PRIMARY   Testing/Procedures: NONE  Follow-Up: Your physician wants you to follow-up in: 6 MONTHS WITH DR. MCDOWELL. You will receive a reminder letter in the mail two months in advance. If you don't receive a letter, please call our office to schedule the follow-up appointment.  Any Other Special Instructions Will Be Listed Below (If Applicable).  If you need a refill on your cardiac medications before your next appointment, please call your pharmacy.

## 2016-12-23 ENCOUNTER — Telehealth: Payer: Self-pay | Admitting: Cardiology

## 2016-12-23 ENCOUNTER — Other Ambulatory Visit: Payer: Self-pay | Admitting: Cardiology

## 2016-12-23 MED ORDER — NITROGLYCERIN 0.2 MG/HR TD PT24: 0.2000 mg | MEDICATED_PATCH | Freq: Every day | TRANSDERMAL | 3 refills | Status: DC | PRN

## 2016-12-23 NOTE — Telephone Encounter (Signed)
° ° ° °  1. Which medications need to be refilled? (please list name of each medication and dose if known)  nitroGLYCERIN (NITRODUR - DOSED IN MG/24    2. Which pharmacy/location (including street and city if local pharmacy) is medication to be sent to? [facility]    3. Do they need a 30 day or 90 day supply?   Patient would like a call back in regards to RX  being filled.

## 2016-12-23 NOTE — Telephone Encounter (Signed)
Mrs. [name] called in regards to setting up an appointment due to having shortness of breath for 2 weeks. States that she saw her PCP last week. I gave her an appointment for 12/30/16.  She was concerned that This appointment might be too long to wait.

## 2016-12-23 NOTE — Telephone Encounter (Signed)
Refilled NTG 

## 2016-12-23 NOTE — Telephone Encounter (Signed)
Reached vm,[id number]

## 2016-12-24 ENCOUNTER — Observation Stay (HOSPITAL_COMMUNITY)
Admission: EM | Admit: 2016-12-24 | Discharge: 2016-12-26 | Disposition: A | Discharge status: Home / Self Care | Payer: Medicare Other | Attending: Internal Medicine | Admitting: Internal Medicine

## 2016-12-24 ENCOUNTER — Emergency Department (HOSPITAL_COMMUNITY): Payer: Medicare Other

## 2016-12-24 ENCOUNTER — Encounter (HOSPITAL_COMMUNITY): Payer: Self-pay | Admitting: *Deleted

## 2016-12-24 DIAGNOSIS — R748 Abnormal levels of other serum enzymes: Secondary | ICD-10-CM | POA: Insufficient documentation

## 2016-12-24 DIAGNOSIS — I2510 Atherosclerotic heart disease of native coronary artery without angina pectoris: Secondary | ICD-10-CM | POA: Diagnosis not present

## 2016-12-24 DIAGNOSIS — I255 Ischemic cardiomyopathy: Secondary | ICD-10-CM | POA: Diagnosis present

## 2016-12-24 DIAGNOSIS — R778 Other specified abnormalities of plasma proteins: Secondary | ICD-10-CM | POA: Diagnosis present

## 2016-12-24 DIAGNOSIS — E039 Hypothyroidism, unspecified: Secondary | ICD-10-CM | POA: Diagnosis not present

## 2016-12-24 DIAGNOSIS — Z87891 Personal history of nicotine dependence: Secondary | ICD-10-CM | POA: Diagnosis not present

## 2016-12-24 DIAGNOSIS — I4892 Unspecified atrial flutter: Secondary | ICD-10-CM | POA: Diagnosis present

## 2016-12-24 DIAGNOSIS — I484 Atypical atrial flutter: Secondary | ICD-10-CM | POA: Diagnosis not present

## 2016-12-24 DIAGNOSIS — R7989 Other specified abnormal findings of blood chemistry: Secondary | ICD-10-CM | POA: Diagnosis present

## 2016-12-24 DIAGNOSIS — R0609 Other forms of dyspnea: Secondary | ICD-10-CM

## 2016-12-24 DIAGNOSIS — Z951 Presence of aortocoronary bypass graft: Secondary | ICD-10-CM | POA: Insufficient documentation

## 2016-12-24 DIAGNOSIS — I110 Hypertensive heart disease with heart failure: Secondary | ICD-10-CM | POA: Insufficient documentation

## 2016-12-24 DIAGNOSIS — I5023 Acute on chronic systolic (congestive) heart failure: Secondary | ICD-10-CM | POA: Diagnosis not present

## 2016-12-24 DIAGNOSIS — K219 Gastro-esophageal reflux disease without esophagitis: Secondary | ICD-10-CM | POA: Diagnosis present

## 2016-12-24 DIAGNOSIS — Z79899 Other long term (current) drug therapy: Secondary | ICD-10-CM | POA: Insufficient documentation

## 2016-12-24 DIAGNOSIS — I5043 Acute on chronic combined systolic (congestive) and diastolic (congestive) heart failure: Secondary | ICD-10-CM

## 2016-12-24 DIAGNOSIS — I5042 Chronic combined systolic (congestive) and diastolic (congestive) heart failure: Secondary | ICD-10-CM | POA: Diagnosis present

## 2016-12-24 DIAGNOSIS — I25119 Atherosclerotic heart disease of native coronary artery with unspecified angina pectoris: Secondary | ICD-10-CM | POA: Diagnosis not present

## 2016-12-24 DIAGNOSIS — E119 Type 2 diabetes mellitus without complications: Secondary | ICD-10-CM | POA: Diagnosis not present

## 2016-12-24 DIAGNOSIS — E1169 Type 2 diabetes mellitus with other specified complication: Secondary | ICD-10-CM | POA: Diagnosis present

## 2016-12-24 DIAGNOSIS — R0602 Shortness of breath: Secondary | ICD-10-CM | POA: Diagnosis present

## 2016-12-24 HISTORY — DX: Ischemic cardiomyopathy: I25.5

## 2016-12-24 HISTORY — DX: Chronic combined systolic (congestive) and diastolic (congestive) heart failure: I50.42

## 2016-12-24 LAB — TROPONIN I
TROPONIN I: 0.03 ng/mL — AB (ref <0.03)
TROPONIN I: 0.03 ng/mL — AB (ref <0.03)
TROPONIN I: 0.03 ng/mL — AB (ref <0.03)

## 2016-12-24 LAB — BASIC METABOLIC PANEL
Anion gap: 11 (ref 5–15)
BUN: 34 mg/dL — AB (ref 6–20)
CALCIUM: 9 mg/dL (ref 8.9–10.3)
CO2: 22 mmol/L (ref 22–32)
CREATININE: 0.86 mg/dL (ref 0.44–1.00)
Chloride: 104 mmol/L (ref 101–111)
GFR calc non Af Amer: >60 mL/min (ref >60)
Glucose, Bld: 182 mg/dL — ABNORMAL HIGH (ref 65–99)
Potassium: 3.3 mmol/L — ABNORMAL LOW (ref 3.5–5.1)
Sodium: 137 mmol/L (ref 135–145)

## 2016-12-24 LAB — PROCALCITONIN

## 2016-12-24 LAB — CBC
HCT: 37.5 % (ref 36.0–46.0)
Hemoglobin: 12.2 g/dL (ref 12.0–15.0)
MCH: 28.9 pg (ref 26.0–34.0)
MCHC: 32.5 g/dL (ref 30.0–36.0)
MCV: 88.9 fL (ref 78.0–100.0)
PLATELETS: 278 10*3/uL (ref 150–400)
RBC: 4.22 MIL/uL (ref 3.87–5.11)
RDW: 14.5 % (ref 11.5–15.5)
WBC: 12.6 10*3/uL — AB (ref 4.0–10.5)

## 2016-12-24 LAB — GLUCOSE, CAPILLARY
GLUCOSE-CAPILLARY: 147 mg/dL — AB (ref 65–99)
Glucose-Capillary: 193 mg/dL — ABNORMAL HIGH (ref 65–99)

## 2016-12-24 LAB — TSH: TSH: 15.569 u[IU]/mL — AB (ref 0.350–4.500)

## 2016-12-24 LAB — BRAIN NATRIURETIC PEPTIDE: B Natriuretic Peptide: 385 pg/mL — ABNORMAL HIGH (ref 0.0–100.0)

## 2016-12-24 MED ORDER — SODIUM CHLORIDE 0.9% FLUSH: 3.0000 mL | INTRAVENOUS | Status: DC | PRN

## 2016-12-24 MED ORDER — ACETAMINOPHEN 500 MG PO TABS
1000.0000 mg | ORAL_TABLET | Freq: Four times a day (QID) | ORAL | Status: DC | PRN
  Administered 2016-12-25: 1000 mg via ORAL
  Filled 2016-12-24: qty 2

## 2016-12-24 MED ORDER — LOSARTAN POTASSIUM 25 MG PO TABS
12.5000 mg | ORAL_TABLET | Freq: Every day | ORAL | Status: DC
  Administered 2016-12-25: 12.5 mg via ORAL
  Filled 2016-12-24: qty 0.5

## 2016-12-24 MED ORDER — FUROSEMIDE 10 MG/ML IJ SOLN
40.0000 mg | Freq: Once | INTRAMUSCULAR | Status: AC
  Administered 2016-12-24: 40 mg via INTRAVENOUS
  Filled 2016-12-24: qty 4

## 2016-12-24 MED ORDER — ONDANSETRON HCL 4 MG/2ML IJ SOLN: 4.0000 mg | Freq: Four times a day (QID) | INTRAMUSCULAR | Status: DC | PRN

## 2016-12-24 MED ORDER — ALBUTEROL SULFATE (2.5 MG/3ML) 0.083% IN NEBU: 2.5000 mg | INHALATION_SOLUTION | Freq: Four times a day (QID) | RESPIRATORY_TRACT | Status: DC | PRN

## 2016-12-24 MED ORDER — SODIUM CHLORIDE 0.9 % IV SOLN: 250.0000 mL | INTRAVENOUS | Status: DC | PRN

## 2016-12-24 MED ORDER — LEVOTHYROXINE SODIUM 75 MCG PO TABS
150.0000 ug | ORAL_TABLET | Freq: Every day | ORAL | Status: DC
  Administered 2016-12-24 – 2016-12-25 (×2): 150 ug via ORAL
  Filled 2016-12-24 (×2): qty 2

## 2016-12-24 MED ORDER — POTASSIUM CHLORIDE CRYS ER 20 MEQ PO TBCR
40.0000 meq | EXTENDED_RELEASE_TABLET | Freq: Once | ORAL | Status: AC
  Administered 2016-12-24: 40 meq via ORAL
  Filled 2016-12-24: qty 2

## 2016-12-24 MED ORDER — CARVEDILOL 12.5 MG PO TABS
12.5000 mg | ORAL_TABLET | Freq: Two times a day (BID) | ORAL | Status: DC
  Administered 2016-12-24 – 2016-12-26 (×4): 12.5 mg via ORAL
  Filled 2016-12-24 (×4): qty 1

## 2016-12-24 MED ORDER — LEVOTHYROXINE SODIUM 75 MCG PO TABS: 150.0000 ug | ORAL_TABLET | Freq: Every day | ORAL | Status: DC

## 2016-12-24 MED ORDER — PANTOPRAZOLE SODIUM 40 MG PO TBEC
40.0000 mg | DELAYED_RELEASE_TABLET | Freq: Every day | ORAL | Status: DC
  Administered 2016-12-25 – 2016-12-26 (×2): 40 mg via ORAL
  Filled 2016-12-24 (×2): qty 1

## 2016-12-24 MED ORDER — SODIUM CHLORIDE 0.9% FLUSH
3.0000 mL | Freq: Two times a day (BID) | INTRAVENOUS | Status: DC
  Administered 2016-12-24 – 2016-12-25 (×3): 3 mL via INTRAVENOUS

## 2016-12-24 MED ORDER — INSULIN ASPART 100 UNIT/ML ~~LOC~~ SOLN
0.0000 [IU] | Freq: Three times a day (TID) | SUBCUTANEOUS | Status: DC
  Administered 2016-12-25: 2 [IU] via SUBCUTANEOUS
  Administered 2016-12-25 – 2016-12-26 (×4): 1 [IU] via SUBCUTANEOUS

## 2016-12-24 MED ORDER — MAGNESIUM OXIDE 400 (241.3 MG) MG PO TABS
200.0000 mg | ORAL_TABLET | Freq: Two times a day (BID) | ORAL | Status: DC
  Administered 2016-12-24 – 2016-12-26 (×4): 200 mg via ORAL
  Filled 2016-12-24 (×4): qty 1

## 2016-12-24 MED ORDER — ALBUTEROL SULFATE HFA 108 (90 BASE) MCG/ACT IN AERS: 1.0000 | INHALATION_SPRAY | Freq: Four times a day (QID) | RESPIRATORY_TRACT | Status: DC | PRN

## 2016-12-24 MED ORDER — APIXABAN 5 MG PO TABS
5.0000 mg | ORAL_TABLET | Freq: Two times a day (BID) | ORAL | Status: DC
  Administered 2016-12-24 – 2016-12-26 (×4): 5 mg via ORAL
  Filled 2016-12-24 (×4): qty 1

## 2016-12-24 NOTE — ED Provider Notes (Signed)
[facility]-EMERGENCY DEPT Provider Note   CSN: [id number] Arrival date & time: 12/24/16  [id number]     History   Chief Complaint Chief Complaint  Patient presents with  . Chest Pain    HPI [name] is a 71 y.o. female.  HPI  71 y.o. female with a hx of CAD, HTN, Chronic Systolic CHF, HLD, PAD, DM2, presents to the Emergency Department today due to chest pressure and shortness of breath that began last night. Noted symptoms x 1-2 weeks. Denies chest pain currently. Described as heaviness. Notes dyspnea with exertion. Improves with rest. No abdominal pain. No N/V. No diaphoresis. No fevers. No cough/congestion or URI symptoms. Noted hx CABG in 2003 with triple bypass. Also noted hx AFib on Eliquis. No hx DVT/PE. No other symptoms noted. Pt also requesting to have her thyroid checked as she does not currently have one. Taken out surgically x 3 years ago. Pt on Levothyroxine. Of note, pt states she believes these symptoms are related to her husband with recent heart surgery over the past 2 weeks and attributes to anxiety.   Cardiology- Dr. Diona BrownerMcDowell  Past Medical History:  Diagnosis Date  . CAD (coronary artery disease)    Multivessel status post CABG in 2003  . Chronic systolic heart failure (HCC)   . Essential hypertension   . GERD (gastroesophageal reflux disease)   . Gum disease   . History of atrial flutter    [place]  . History of colonic polyps   . History of goiter   . Hyperlipidemia   . Hypothyroidism   . Myocardial infarction (HCC)    2003  . Peripheral arterial disease (HCC)    Stent revascularization 2015 - details not clear  . Type 2 diabetes mellitus (HCC)     There are no active problems to display for this patient.   Past Surgical History:  Procedure Laterality Date  . CHOLECYSTECTOMY     2012  . CORONARY ARTERY BYPASS GRAFT     2003  . PARATHYROIDECTOMY     2015  . THYROIDECTOMY    . TONSILLECTOMY     1967    OB History    No  data available       Home Medications    Prior to Admission medications   Medication Sig Start Date End Date Taking? Authorizing Provider  acetaminophen (TYLENOL) 500 MG tablet Take 1,000 mg by mouth every 6 (six) hours as needed for moderate pain.    [provider]  albuterol (PROVENTIL HFA;VENTOLIN HFA) 108 (90 Base) MCG/ACT inhaler Inhale 1 puff into the lungs every 6 (six) hours as needed for wheezing or shortness of breath.    [provider]  apixaban (ELIQUIS) 5 MG TABS tablet Take 1 tablet (5 mg total) by mouth 2 (two) times daily. 10/08/16 06/25/17  Jonelle SidleMcDowell, Samuel G, MD  carvedilol (COREG) 12.5 MG tablet Take 12.5 mg by mouth 2 (two) times daily with a meal.    [provider]  Cholecalciferol (VITAMIN D3 PO) Take 1 tablet by mouth daily.    [provider]  furosemide (LASIX) 20 MG tablet Take 20-40 mg by mouth daily. Alternating 20 mg and 40 mg every other day. 01/22/16 10/08/16  [provider]  levothyroxine (SYNTHROID, LEVOTHROID) 125 MCG tablet Take 125 mcg by mouth daily before breakfast.    [provider]  Magnesium 250 MG TABS Take 250 mg by mouth 2 (two) times daily.    [provider]  nitroGLYCERIN (NITRODUR - DOSED IN MG/24 HR) 0.2 mg/hr patch PLACE 1 PATCH ONTO THE SKIN DAILY AS NEEDED FOR CHEST PAINS 12/23/16   Jonelle Sidle, MD  omeprazole (PRILOSEC) 40 MG capsule Take 40 mg by mouth daily.  01/22/16 08/18/16  [provider]    Family History Family History  Problem Relation Age of Onset  . Stroke Mother   . Heart attack Father     Social History Social History  Substance Use Topics  . Smoking status: Former Smoker    Types: Cigarettes  . Smokeless tobacco: Never Used  . Alcohol use No     Allergies   Oxycodone; Pollen extract; and Statins   Review of Systems Review of Systems ROS reviewed and all are negative for acute change except as noted in the HPI.  Physical  Exam Updated Vital Signs BP 121/62   Pulse 94   Temp 98 F (36.7 C) (Oral)   Resp 15   Ht 5\' 3"  (1.6 m)   Wt 82.6 kg (182 lb)   SpO2 96%   BMI 32.24 kg/m   Physical Exam  Constitutional: She is oriented to person, place, and time. Vital signs are normal. She appears well-developed and well-nourished.  HENT:  Head: Normocephalic and atraumatic.  Right Ear: Hearing normal.  Left Ear: Hearing normal.  Eyes: Conjunctivae and EOM are normal. Pupils are equal, round, and reactive to light.  Neck: Normal range of motion. Neck supple.  Cardiovascular: Normal rate, regular rhythm, normal heart sounds and intact distal pulses.   Pulmonary/Chest: Effort normal and breath sounds normal. She has no decreased breath sounds. She has no wheezes. She has no rhonchi. She has no rales.  Abdominal: Soft. There is no tenderness.  Musculoskeletal: Normal range of motion.  Neurological: She is alert and oriented to person, place, and time.  Skin: Skin is warm and dry.  Psychiatric: She has a normal mood and affect. Her speech is normal and behavior is normal. Thought content normal.  Nursing note and vitals reviewed.    ED Treatments / Results  Labs (all labs ordered are listed, but only abnormal results are displayed) Labs Reviewed  BASIC METABOLIC PANEL - Abnormal; Notable for the following:       Result Value   Potassium 3.3 (*)    Glucose, Bld 182 (*)    BUN 34 (*)    All other components within normal limits  CBC - Abnormal; Notable for the following:    WBC 12.6 (*)    All other components within normal limits  TROPONIN I - Abnormal; Notable for the following:    Troponin I 0.03 (*)    All other components within normal limits  BRAIN NATRIURETIC PEPTIDE - Abnormal; Notable for the following:    B Natriuretic Peptide 385.0 (*)    All other components within normal limits  TSH - Abnormal; Notable for the following:    TSH 15.569 (*)    All other components within normal limits  T4     EKG  EKG Interpretation  Date/Time:  Friday December 24 2016 08:22:51 EDT Ventricular Rate:  94 PR Interval:    QRS Duration: 92 QT Interval:  442 QTC Calculation: 553 R Axis:   128 Text Interpretation:  Atrial flutter Probable lateral infarct, age indeterminate Probable anteroseptal infarct, old Prolonged QT interval Artifact When compared with ECG of 03/19/2016 Atrial flutter has replaced Normal sinus rhythm Confirmed by [facility]  MD, Nicholos Johns [phone]) on 12/24/2016 8:34:39  AM       Radiology Dg Chest 2 View  Result Date: 12/24/2016 CLINICAL DATA:  Chest pain and shortness of breath EXAM: CHEST  2 VIEW COMPARISON:  None available FINDINGS: Remote median sternotomy. Moderate cardiomegaly with central vascular congestion and chronic bronchitic change. No focal pneumonia, collapse or consolidation. Negative for edema, effusion or pneumothorax. Trachea is midline. Atherosclerosis of the aorta. Degenerative changes of the spine. Bones are osteopenic. IMPRESSION: Cardiomegaly with vascular congestion and chronic bronchitic change centrally. No superimposed CHF or pneumonia. Thoracic aortic atherosclerosis Electronically Signed   By: Judie Petit.  Shick M.D.   On: 12/24/2016 09:30    Procedures Procedures (including critical care time)  Medications Ordered in ED Medications - No data to display   Initial Impression / Assessment and Plan / ED Course  I have reviewed the triage vital signs and the nursing notes.  Pertinent labs & imaging results that were available during my care of the patient were reviewed by me and considered in my medical decision making (see chart for details).  Final Clinical Impressions(s) / ED Diagnoses  {I have reviewed and evaluated the relevant laboratory values. {I have reviewed and evaluated the relevant imaging studies. {I have interpreted the relevant EKG. {I have reviewed the relevant previous healthcare records.  {I obtained HPI from historian. {Patient discussed with  supervising physician.  ED Course:  Assessment: Pt is a 71 y.o. female with hx CAD, HTN, Chronic Systolic CHF, HLD, PAD, DM2 who presents with presents to the Emergency Department today due to chest pressure and shortness of breath that began last night. Noted symptoms x 1-2 weeks. Denies chest pain currently. Described as heaviness. Notes dyspnea with exertion. Improves with rest. No abdominal pain. No N/V. No diaphoresis. No fevers. No cough/congestion or URI symptoms. Noted hx CABG in 2003 with triple bypass. Also noted hx AFib on Eliquis. CHADSVASC score is 6. LVEF 35-40% .No hx DVT/PE. No other symptoms noted. Pt also requesting to have her thyroid checked as she does not currently have one. Taken out surgically x 3 years ago. Pt on Levothyroxine. Of note, pt states she believes these symptoms are related to her husband with recent heart surgery over the past 2 weeks and attributes to anxiety.   On exam, pt in NAD. Nontoxic/nonseptic appearing. VSS. Afebrile. Lungs CTA. Heart RRR. Abdomen nontender soft. CBC unremarkable. BMP unremarkable Trop 0.03. EKG with possible atrial flutter. TSH 15.57. Currently on Levothyroxine 125 mcg. CXR with vascular congestion. Seen by supervising physician. Consult to Cardiology (Dr. Diona Browner) due to elevated Troponin and Dyspnea with exertion. Did not recommend any further intervention. Serial Troponins. Dyspnea either related to A Flutter vs. Mild volume overload. Plan is to Admit to medicine.   Disposition/Plan:  Admit Pt acknowledges and agrees with plan  Supervising Physician Samuel Jester, DO  Final diagnoses:  Dyspnea on exertion  Elevated troponin  Acute on chronic systolic congestive heart failure (HCC)  Atrial flutter by electrocardiogram (HCC)  Hypothyroidism, unspecified type    New Prescriptions New Prescriptions   No medications on file     Audry Pili, PA-C 12/24/16 1054    Samuel Jester, DO 12/25/16 1243

## 2016-12-24 NOTE — Consult Note (Signed)
Cardiology Consultation:   Patient ID: [name]; [id number]; 12/29/1945   Admit date: 12/24/2016 Date of Consult: 12/24/2016  Primary Care Provider: Juliette Alcide, MD Primary Cardiologist: Dr. Jonelle Sidle   Patient Profile:   [name] is a 71 y.o. female with a hx of CAD status post CABG in 2003, chronic systolic heart failure with LVEF 35-40%, type 2 diabetes mellitus, hyperlipidemia, hypertension, and paroxysmal atrial flutter on Eliquis who is being seen today for the evaluation of worsening shortness of breath at the request of Mr. [name].  History of Present Illness:   [name] presents to the ER describing approximately 2 week history of worsening dyspnea on exertion and fatigue. She has been under a lot of stress, her husband is currently hospitalized after having undergone open heart surgery with aortic valve replacement. She does not report any chest pain or palpitations, has had fluctuating weight gain and has taken additional diuretic doses at home. She is noted to be in atrial flutter by ECG and telemetry today, onset uncertain but potentially related to her symptoms as well. She was in sinus rhythm at her last office visit in April. She reports compliance with her medications which are outlined below.  Past Medical History:  Diagnosis Date  . CAD (coronary artery disease)    Multivessel status post CABG in 2003  . Chronic systolic heart failure (HCC)   . Essential hypertension   . GERD (gastroesophageal reflux disease)   . Gum disease   . History of atrial flutter    Cardioversion 2016 in [place]  . History of colonic polyps   . History of goiter   . Hyperlipidemia   . Hypothyroidism   . Myocardial infarction (HCC)    2003  . Peripheral arterial disease (HCC)    Stent revascularization 2015 - details not clear  . Type 2 diabetes mellitus (HCC)     Past Surgical History:  Procedure Laterality Date  . CHOLECYSTECTOMY     2012  .  CORONARY ARTERY BYPASS GRAFT     2003  . PARATHYROIDECTOMY     2015  . THYROIDECTOMY    . TONSILLECTOMY     1967     Outpatient Medications: No current facility-administered medications on file prior to encounter.    Current Outpatient Prescriptions on File Prior to Encounter  Medication Sig Dispense Refill  . acetaminophen (TYLENOL) 500 MG tablet Take 1,000 mg by mouth every 6 (six) hours as needed for moderate pain.    Marland Kitchen albuterol (PROVENTIL HFA;VENTOLIN HFA) 108 (90 Base) MCG/ACT inhaler Inhale 1 puff into the lungs every 6 (six) hours as needed for wheezing or shortness of breath.    Marland Kitchen apixaban (ELIQUIS) 5 MG TABS tablet Take 1 tablet (5 mg total) by mouth 2 (two) times daily. 60 tablet 8  . carvedilol (COREG) 12.5 MG tablet Take 12.5 mg by mouth 2 (two) times daily with a meal.    . Cholecalciferol (VITAMIN D3 PO) Take 1 tablet by mouth daily.    . furosemide (LASIX) 20 MG tablet Take 20-40 mg by mouth daily. Alternating 20 mg and 40 mg every other day.    . Magnesium 250 MG TABS Take 250 mg by mouth 2 (two) times daily.    . nitroGLYCERIN (NITRODUR - DOSED IN MG/24 HR) 0.2 mg/hr patch PLACE 1 PATCH ONTO THE SKIN DAILY AS NEEDED FOR CHEST PAINS 90 patch 3  . omeprazole (PRILOSEC) 40 MG capsule Take 40 mg by  mouth daily.       Allergies:    Allergies  Allergen Reactions  . Oxycodone Other (See Comments)    hallucinations   . Pollen Extract Other (See Comments)    Sneezing, running nose.  . Statins Other (See Comments)    Cramping     Social History:   Social History   Social History  . Marital status: Married    Spouse name: N/A  . Number of children: N/A  . Years of education: N/A   Occupational History  . Not on file.   Social History Main Topics  . Smoking status: Former Smoker    Types: Cigarettes  . Smokeless tobacco: Never Used  . Alcohol use No  . Drug use: No  . Sexual activity: Not on file   Other Topics Concern  . Not on file   Social History  Narrative  . No narrative on file    Family History:   The patient's family history includes Heart attack in her father; Stroke in her mother.  ROS:  Please see the history of present illness.  ROS  Anxiety, some insomnia and stress recently. All other ROS reviewed and negative.     Physical Exam/Data:   Vitals:   12/24/16 0820 12/24/16 0823 12/24/16 0900  BP:  129/80 121/62  Pulse:  95 94  Resp:  18 15  Temp:  98 F (36.7 C)   TempSrc:  Oral   SpO2:  97% 96%  Weight: 182 lb (82.6 kg)    Height: 5\' 3"  (1.6 m)     No intake or output data in the 24 hours ending 12/24/16 1016 Filed Weights   12/24/16 0820  Weight: 182 lb (82.6 kg)   Body mass index is 32.24 kg/m.  Gen.: Overweight woman in no distress. HEENT: Conjunctiva and lids normal, oropharynx clear. Neck: Supple, no elevated JVP or carotid bruits, no thyromegaly. Lungs: Clear to auscultation, nonlabored breathing at rest. Cardiac: Irregular rhythm, no S3, soft systolic murmur, no pericardial rub. Abdomen: Soft, nontender, bowel sounds present, no guarding or rebound. Extremities: Trace lower leg edema, distal pulses 2+. Skin: Warm and dry. Musculoskeletal: No kyphosis. Neuropsychiatric: Alert and oriented x3, affect grossly appropriate.   EKG:  The EKG was personally reviewed and demonstrates:  Atrial flutter with variable conduction, possible old anteroseptal and lateral infarct pattern, nonspecific ST changes. Telemetry:  Telemetry was personally reviewed and demonstrates:  Rate-controlled atrial flutter.  Relevant CV Studies:  Echocardiogram 03/16/2016: Study Conclusions  - Left ventricle: The cavity size was normal. Wall thickness was   increased in a pattern of mild LVH. Systolic function was   moderately reduced. The estimated ejection fraction was in the   range of 35% to 40%. The apex is poorly visualized in the apical   views, but appears to be hypokinetic. Can consider contrast study   if further  evaluation needed. Features are consistent with a   pseudonormal left ventricular filling pattern, with concomitant   abnormal relaxation and increased filling pressure (grade 2   diastolic dysfunction). Doppler parameters are consistent with   high ventricular filling pressure. - Aortic valve: Valve area (VTI): 1.76 cm^2. Valve area (Vmax):   1.95 cm^2. Valve area (Vmean): 1.84 cm^2. - Mitral valve: There was mild regurgitation. - Left atrium: The atrium was severely dilated. - Right ventricle: Systolic function was mildly reduced. - Right atrium: The atrium was mildly dilated. - Pulmonary arteries: Systolic pressure was moderately increased.   PA peak pressure:  56 mm Hg (S). - Technically adequate study.  Laboratory Data:  Chemistry  Recent Labs Lab 12/24/16 0829  NA 137  K 3.3*  CL 104  CO2 22  GLUCOSE 182*  BUN 34*  CREATININE 0.86  CALCIUM 9.0  GFRNONAA >60  GFRAA >60  ANIONGAP 11    No results for input(s): PROT, ALBUMIN, AST, ALT, ALKPHOS, BILITOT in the last 168 hours. Hematology  Recent Labs Lab 12/24/16 0829  WBC 12.6*  RBC 4.22  HGB 12.2  HCT 37.5  MCV 88.9  MCH 28.9  MCHC 32.5  RDW 14.5  PLT 278   Cardiac Enzymes  Recent Labs Lab 12/24/16 0829  TROPONINI 0.03*   No results for input(s): TROPIPOC in the last 168 hours.  BNP  Recent Labs Lab 12/24/16 0824  BNP 385.0*     Radiology/Studies:  Dg Chest 2 View  Result Date: 12/24/2016 CLINICAL DATA:  Chest pain and shortness of breath EXAM: CHEST  2 VIEW COMPARISON:  None available FINDINGS: Remote median sternotomy. Moderate cardiomegaly with central vascular congestion and chronic bronchitic change. No focal pneumonia, collapse or consolidation. Negative for edema, effusion or pneumothorax. Trachea is midline. Atherosclerosis of the aorta. Degenerative changes of the spine. Bones are osteopenic. IMPRESSION: Cardiomegaly with vascular congestion and chronic bronchitic change centrally. No  superimposed CHF or pneumonia. Thoracic aortic atherosclerosis Electronically Signed   By: Judie Petit.  Shick M.D.   On: 12/24/2016 09:30    Assessment and Plan:   1. Recent worsening shortness of breath, suspect mild component of acute on chronic combined heart failure in the setting of recurrent atrial flutter of uncertain duration. Patient is been under a lot of emotional stress recently, her husband currently hospitalized after heart surgery. She states that she has been taking her medications regularly, has used additional doses of Lasix due to fluctuating weight gain.  2. History of paroxysmal atrial flutter, on Eliquis as an outpatient. She was in sinus rhythm as of April. She has declined antiarrhythmic therapy based on prior workup.  3. Ischemic cardiomyopathy with LVEF 35-40% and moderate diastolic dysfunction. She has declined ICD.  4. CAD status post CABG in 2003. No obvious angina symptoms. Troponin I levels are minimally increased and not suggestive of ACS at this time.  5. History of hypothyroidism, TSH 15 on Synthroid but apparently improving significantly per discussion with patient (I am not certain about current dose of Synthroid, patient states that her TSH had been in the 80s, following with PCP).  Discussed situation with patient. She is being admitted to the hospitalist team here at [facility] for observation. Would cycle cardiac markers, place her on IV Lasix for gentle diuresis. Continue Eliquis and Coreg. Will try and add low-dose ARB and also Lanoxin. It may be the primary issue here is recurring atrial flutter. If no clear evidence of ACS and she improves with medication adjustments, would anticipate arranging outpatient follow-up with EP to discuss rhythm treatment options, potentially ablation.   Signed, Nona Dell, MD  12/24/2016 10:16 AM

## 2016-12-24 NOTE — ED Notes (Signed)
Ambulated to restroom independently  

## 2016-12-24 NOTE — ED Notes (Signed)
CRITICAL VALUE ALERT  Critical Value:  Troponin 0.03  Date & Time Notied:  12/24/16 at 0915  Provider Notified: [place]  Orders Received/Actions taken: MD made aware.

## 2016-12-24 NOTE — Progress Notes (Signed)
Patient IV removed, Telemetry removed, tolerated well. Discharge instructions given at bedside.

## 2016-12-24 NOTE — ED Triage Notes (Signed)
Pt states she began having chest pressure and sob last night. Denies any chest pain, only heaviness. Hx of CABG. States she has felt her heart racing but it went away. Pt has had a lot of anxiety lately, thinks this could be related to that.

## 2016-12-24 NOTE — Telephone Encounter (Signed)
Noted to today, pt is admitted [facility]

## 2016-12-24 NOTE — H&P (Signed)
History and Physical    [name]:[id number] DOB: 1945-12-02 DOA: 12/24/2016  Referring MD/NP/PA: Audry Pili PCP: Juliette Alcide, MD   Patient coming from: home  Chief Complaint: SOB, palpitations, chest pressure  HPI: [name] is a 71 y.o. female with history of CAD status post CABG in 2003, chronic systolic heart failure with LVEF of 35-40 percent, diabetes mellitus type 2, hypertension, hyperlipidemia, paroxysmal atrial flutter on anticoagulation who presented to the emergency department with palpitations, shortness of breath, and chest pressure.  Patient states she has been under a lot of stress over the last few weeks because her husband has been hospitalized for open heart surgery at [facility] and has been in the ICU for a prolonged stay. Ever since her husband got sick, she has noticed increased dyspnea on exertion. She saw her primary care doctor gave her six-day course of prednisone and an antibiotic. She completed her prednisone yesterday. After starting her prednisone, she gained 10 or 11 pounds of water weight so she increased her Lasix and was able to diuresed back to her base weight of 182 pounds over the course of 2 days. Her lower extremity edema improved but she continued to have some orthopnea. Overnight, she woke up with some chest pressure and increased shortness of breath and palpitations similar to when she previously had atrial flutter 2 years ago. She put on a nitroglycerin patch which did not help. She remained resting in bed for several hours but her symptoms continued and so she eventually presented to the emergency department. By the time she got to the emergency department, her palpitations had resolved. They were not associated with nausea, diaphoresis, lightheadedness.  Denies recent fevers, chills, productive cough.    ED Course: Vital signs stable.  Labs:  WBC 12.6, BUN 34 (recent steroids), creatinine 0.86, initial troponin 0.03.   ECG with atrial flutter,  2:1 conduction. Old anteroseptal infarct. Prolonged QTc 553 ms.  BNP 385.  CXR with vascular congestion.    Review of Systems:  General:  Denies fevers, chills, weight change HEENT:  Denies changes to hearing and vision, sinus congestion, sore throat CV:  Per HPI.  PULM:  Denies cough.  Positive SOB GI:  Denies nausea, vomiting, constipation, diarrhea.   GU:  Denies dysuria, frequency, urgency ENDO:  Denies polyuria, polydipsia.   HEME:  Denies blood in stools, abnormal bruising or bleeding.  LYMPH:  Denies lymphadenopathy.   MSK:  Denies arthralgias, myalgias.   DERM:  Denies skin rash or ulcer.   NEURO:  Denies new focal numbness or weakness, slurred speech, confusion PSYCH:  Positive anxiety.  Denies SI    Past Medical History:  Diagnosis Date  . CAD (coronary artery disease)    Multivessel status post CABG in 2003  . Chronic systolic heart failure (HCC)   . Essential hypertension   . GERD (gastroesophageal reflux disease)   . Gum disease   . History of atrial flutter    Cardioversion 2016 in [place]  . History of colonic polyps   . History of goiter   . Hyperlipidemia   . Hypothyroidism   . Myocardial infarction (HCC)    2003  . Peripheral arterial disease (HCC)    Stent revascularization 2015 - details not clear  . Type 2 diabetes mellitus (HCC)     Past Surgical History:  Procedure Laterality Date  . CHOLECYSTECTOMY     2012  . CORONARY ARTERY BYPASS GRAFT     2003  . PARATHYROIDECTOMY  2015  . THYROIDECTOMY    . TONSILLECTOMY     1967     reports that she has quit smoking. Her smoking use included Cigarettes. She has never used smokeless tobacco. She reports that she does not drink alcohol or use drugs.  Allergies  Allergen Reactions  . Oxycodone Other (See Comments)    hallucinations   . Pollen Extract Other (See Comments)    Sneezing, running nose.  . Statins Other (See Comments)    Cramping     Family History  Problem Relation Age of Onset    . Stroke Mother   . Heart attack Father   . Breast cancer Maternal Aunt   . Breast cancer Paternal Aunt     Prior to Admission medications   Medication Sig Start Date End Date Taking? Authorizing Provider  acetaminophen (TYLENOL) 500 MG tablet Take 1,000 mg by mouth every 6 (six) hours as needed for moderate pain.   Yes [provider]  albuterol (PROVENTIL HFA;VENTOLIN HFA) 108 (90 Base) MCG/ACT inhaler Inhale 1 puff into the lungs every 6 (six) hours as needed for wheezing or shortness of breath.   Yes [provider]  apixaban (ELIQUIS) 5 MG TABS tablet Take 1 tablet (5 mg total) by mouth 2 (two) times daily. 10/08/16 06/25/17 Yes Jonelle Sidle, MD  carvedilol (COREG) 12.5 MG tablet Take 12.5 mg by mouth 2 (two) times daily with a meal.   Yes [provider]  Cholecalciferol (VITAMIN D3 PO) Take 1 tablet by mouth daily.   Yes [provider]  furosemide (LASIX) 20 MG tablet Take 20-40 mg by mouth daily. Alternating 20 mg and 40 mg every other day. 01/22/16 12/24/16 Yes [provider]  levothyroxine (SYNTHROID, LEVOTHROID) 137 MCG tablet TK 1 T PO D 11/17/16  Yes [provider]  Magnesium 250 MG TABS Take 250 mg by mouth 2 (two) times daily.   Yes [provider]  nitroGLYCERIN (NITRODUR - DOSED IN MG/24 HR) 0.2 mg/hr patch PLACE 1 PATCH ONTO THE SKIN DAILY AS NEEDED FOR CHEST PAINS 12/23/16  Yes Jonelle Sidle, MD  omeprazole (PRILOSEC) 40 MG capsule Take 40 mg by mouth daily.  01/22/16 12/24/16 Yes [provider]    Physical Exam: Vitals:   12/24/16 1100 12/24/16 1130 12/24/16 1200 12/24/16 1230  BP: 128/80 128/89 (!) 108/55 121/66  Pulse: 94 98 89 88  Resp: (!) 25 (!) 21 (!) 24 (!) 22  Temp:      TempSrc:      SpO2: 94% 95% 94% 96%  Weight:      Height:        Constitutional:  NAD, calm, comfortable Eyes: PERRL, lids and conjunctivae normal ENMT:  Moist mucous membranes.  Oropharynx nonerythematous,  no exudates.   Neck:  No nuchal rigidity, no masses Respiratory:  No wheezes, rales, or rhonchi Cardiovascular: Regular rate and rhythm, no murmurs / rubs / gallops.  2+ radial pulses. Abdomen:  Normal active bowel sounds, soft, nondistended, nontender Musculoskeletal: Normal muscle tone and bulk.  No contractures.  Skin:  no rashes, abrasions, or ulcers Neurologic:  CN 2-12 grossly intact. Sensation intact to light touch, strength 5/5 throughout Psychiatric:  Alert and oriented x 3. Normal affect.   Labs on Admission: I have personally reviewed following labs and imaging studies  CBC:  Recent Labs Lab 12/24/16 0829  WBC 12.6*  HGB 12.2  HCT 37.5  MCV 88.9  PLT 278   Basic Metabolic Panel:  Recent Labs Lab 12/24/16 0829  NA 137  K 3.3*  CL 104  CO2 22  GLUCOSE 182*  BUN 34*  CREATININE 0.86  CALCIUM 9.0   GFR: Estimated Creatinine Clearance: 62.9 mL/min (by C-G formula based on SCr of 0.86 mg/dL). Liver Function Tests: No results for input(s): AST, ALT, ALKPHOS, BILITOT, PROT, ALBUMIN in the last 168 hours. No results for input(s): LIPASE, AMYLASE in the last 168 hours. No results for input(s): AMMONIA in the last 168 hours. Coagulation Profile: No results for input(s): INR, PROTIME in the last 168 hours. Cardiac Enzymes:  Recent Labs Lab 12/24/16 0829  TROPONINI 0.03*   BNP (last 3 results) No results for input(s): PROBNP in the last 8760 hours. HbA1C: No results for input(s): HGBA1C in the last 72 hours. CBG: No results for input(s): GLUCAP in the last 168 hours. Lipid Profile: No results for input(s): CHOL, HDL, LDLCALC, TRIG, CHOLHDL, LDLDIRECT in the last 72 hours. Thyroid Function Tests:  Recent Labs  12/24/16 0824  TSH 15.569*   Anemia Panel: No results for input(s): VITAMINB12, FOLATE, FERRITIN, TIBC, IRON, RETICCTPCT in the last 72 hours. Urine analysis: No results found for: COLORURINE, APPEARANCEUR, LABSPEC, PHURINE, GLUCOSEU, HGBUR,  BILIRUBINUR, KETONESUR, PROTEINUR, UROBILINOGEN, NITRITE, LEUKOCYTESUR Sepsis Labs: @LABRCNTIP (procalcitonin:4,lacticidven:4) )No results found for this or any previous visit (from the past 240 hour(s)).   Radiological Exams on Admission: Dg Chest 2 View  Result Date: 12/24/2016 CLINICAL DATA:  Chest pain and shortness of breath EXAM: CHEST  2 VIEW COMPARISON:  None available FINDINGS: Remote median sternotomy. Moderate cardiomegaly with central vascular congestion and chronic bronchitic change. No focal pneumonia, collapse or consolidation. Negative for edema, effusion or pneumothorax. Trachea is midline. Atherosclerosis of the aorta. Degenerative changes of the spine. Bones are osteopenic. IMPRESSION: Cardiomegaly with vascular congestion and chronic bronchitic change centrally. No superimposed CHF or pneumonia. Thoracic aortic atherosclerosis Electronically Signed   By: Judie PetitM.  Shick M.D.   On: 12/24/2016 09:30    EKG: Independently reviewed. ECG with atrial flutter, 2:1 conduction. Old anteroseptal infarct. Prolonged QTc 553 ms.  No acute ischemic changes  Assessment/Plan Principal Problem:   Acute on chronic systolic heart failure (HCC) Active Problems:   Paroxysmal atrial flutter (HCC)   Coronary artery disease   Hypothyroidism   GERD (gastroesophageal reflux disease)   Diabetes mellitus type 2 in nonobese (HCC)   Dyspnea with chest pressure and palpitations, possibly due to an episode of atrial flutter this morning.  Rule out ACS, but story is atypical and initial troponin is negative.  Her vascular congestion on CXR may be related to her tachycardia overnight as she is currently at her dry weight.   - Rate currently controlled -  Tele -  Cycle troponins - CHADs2vasc = 6, needs anticoagulation.  Continue apixaban  Acute on chronic systolic heart failure, last LVEF 35-40% on 02/2017, possibly exacerbated due to arrhythmia overnight -  Daily weights -  Strict I/O -  Lasix 40mg  IV  once -  Continue beta blocker -  Start low dose ARB (BP is low so will monitor closely) -  ECHO  CAD s/p CABG 2003.   -  Continue eliquis, BB -  intolerant to statin  Prolonged QTc may be artifact secondary to atrial flutter on ECG -  Repeat ECG in AM -  Check magnesium -  Avoid QTc prolonging medications  Hypothyroidism, TSH trending down but still elevated.   -  Increase to levothyroxine 150mcg  GERD, stable, continue PPI  Diabetes  mellitus type 2 on problem list and initial CBG elevated -  SSI -  Check A1c  DVT prophylaxis: Apixaban  Code Status: Full code Family Communication:  Patient alone  Disposition Plan: To home  Consults called: Cardiology  Admission status: Observation, telemetry. Patient is at risk of decompensation secondary to her coronary artery disease, chronic systolic heart failure with dyspnea. Anticipate discharge to home in 1-2 days.  Renae Fickle MD [facility] Pager [phone]  If 7PM-7AM, please contact night-coverage [email or web address] Password [email or web address]  12/24/2016, 3:22 PM

## 2016-12-25 ENCOUNTER — Observation Stay (HOSPITAL_BASED_OUTPATIENT_CLINIC_OR_DEPARTMENT_OTHER): Payer: Medicare Other

## 2016-12-25 ENCOUNTER — Encounter (HOSPITAL_COMMUNITY): Payer: Self-pay | Admitting: Internal Medicine

## 2016-12-25 DIAGNOSIS — R0600 Dyspnea, unspecified: Secondary | ICD-10-CM | POA: Diagnosis not present

## 2016-12-25 DIAGNOSIS — E039 Hypothyroidism, unspecified: Secondary | ICD-10-CM | POA: Diagnosis not present

## 2016-12-25 DIAGNOSIS — R748 Abnormal levels of other serum enzymes: Secondary | ICD-10-CM

## 2016-12-25 DIAGNOSIS — R7989 Other specified abnormal findings of blood chemistry: Secondary | ICD-10-CM

## 2016-12-25 DIAGNOSIS — I5023 Acute on chronic systolic (congestive) heart failure: Secondary | ICD-10-CM | POA: Diagnosis not present

## 2016-12-25 DIAGNOSIS — I4892 Unspecified atrial flutter: Secondary | ICD-10-CM | POA: Diagnosis not present

## 2016-12-25 DIAGNOSIS — E1169 Type 2 diabetes mellitus with other specified complication: Secondary | ICD-10-CM | POA: Diagnosis not present

## 2016-12-25 DIAGNOSIS — R778 Other specified abnormalities of plasma proteins: Secondary | ICD-10-CM | POA: Diagnosis present

## 2016-12-25 DIAGNOSIS — I5042 Chronic combined systolic (congestive) and diastolic (congestive) heart failure: Secondary | ICD-10-CM | POA: Diagnosis present

## 2016-12-25 DIAGNOSIS — I255 Ischemic cardiomyopathy: Secondary | ICD-10-CM | POA: Diagnosis not present

## 2016-12-25 HISTORY — DX: Chronic combined systolic (congestive) and diastolic (congestive) heart failure: I50.42

## 2016-12-25 HISTORY — DX: Ischemic cardiomyopathy: I25.5

## 2016-12-25 LAB — HEMOGLOBIN A1C
Hgb A1c MFr Bld: 7 % — ABNORMAL HIGH (ref 4.8–5.6)
MEAN PLASMA GLUCOSE: 154 mg/dL

## 2016-12-25 LAB — GLUCOSE, CAPILLARY
GLUCOSE-CAPILLARY: 135 mg/dL — AB (ref 65–99)
Glucose-Capillary: 194 mg/dL — ABNORMAL HIGH (ref 65–99)
Glucose-Capillary: 130 mg/dL — ABNORMAL HIGH (ref 65–99)
Glucose-Capillary: 167 mg/dL — ABNORMAL HIGH (ref 65–99)

## 2016-12-25 LAB — BASIC METABOLIC PANEL
ANION GAP: 11 (ref 5–15)
BUN: 25 mg/dL — ABNORMAL HIGH (ref 6–20)
CO2: 24 mmol/L (ref 22–32)
CREATININE: 0.98 mg/dL (ref 0.44–1.00)
Calcium: 9 mg/dL (ref 8.9–10.3)
Chloride: 106 mmol/L (ref 101–111)
GFR calc Af Amer: >60 mL/min (ref >60)
GFR, EST NON AFRICAN AMERICAN: 58 mL/min — AB (ref >60)
Glucose, Bld: 155 mg/dL — ABNORMAL HIGH (ref 65–99)
Potassium: 3.6 mmol/L (ref 3.5–5.1)
SODIUM: 141 mmol/L (ref 135–145)

## 2016-12-25 LAB — ECHOCARDIOGRAM COMPLETE
Height: 63 in
Weight: 2932.8 oz

## 2016-12-25 LAB — T4: T4, Total: 8.6 ug/dL (ref 4.5–12.0)

## 2016-12-25 LAB — TROPONIN I: TROPONIN I: 0.05 ng/mL — AB (ref <0.03)

## 2016-12-25 MED ORDER — LOSARTAN POTASSIUM 25 MG PO TABS: 12.5000 mg | ORAL_TABLET | Freq: Every day | ORAL | Status: DC

## 2016-12-25 MED ORDER — POTASSIUM CHLORIDE CRYS ER 20 MEQ PO TBCR
20.0000 meq | EXTENDED_RELEASE_TABLET | Freq: Two times a day (BID) | ORAL | Status: DC
  Administered 2016-12-25 – 2016-12-26 (×2): 20 meq via ORAL
  Filled 2016-12-25 (×2): qty 1

## 2016-12-25 MED ORDER — INSULIN GLARGINE 100 UNIT/ML ~~LOC~~ SOLN
12.0000 [IU] | Freq: Every day | SUBCUTANEOUS | Status: DC
  Administered 2016-12-25: 12 [IU] via SUBCUTANEOUS
  Filled 2016-12-25 (×2): qty 0.12

## 2016-12-25 MED ORDER — POTASSIUM CHLORIDE CRYS ER 20 MEQ PO TBCR: 30.0000 meq | EXTENDED_RELEASE_TABLET | Freq: Two times a day (BID) | ORAL | Status: DC

## 2016-12-25 MED ORDER — FUROSEMIDE 10 MG/ML IJ SOLN
40.0000 mg | Freq: Every day | INTRAMUSCULAR | Status: DC
  Administered 2016-12-25: 40 mg via INTRAVENOUS
  Filled 2016-12-25: qty 4

## 2016-12-25 MED ORDER — DIGOXIN 125 MCG PO TABS
0.1250 mg | ORAL_TABLET | Freq: Every day | ORAL | Status: DC
Start: 2016-12-25 — End: 2016-12-26
  Administered 2016-12-25 – 2016-12-26 (×2): 0.125 mg via ORAL
  Filled 2016-12-25 (×2): qty 1

## 2016-12-25 NOTE — Progress Notes (Signed)
PROGRESS NOTE    [name]  ZOX:[id number] DOB: Dec 26, 1945 DOA: 12/24/2016 PCP: Juliette Alcide, MD  Cardiologist Dr. 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 She was recently treated with a prednisone taper due to presumed COPD exacerbation or bronchitis. Following the prednisone, she gained 10 or 11 pounds of water rate, so she increased her Lasix and was able to diuresed back to her base weight of 182 pounds. In the ED, she was afebrile and hemodynamically stable. Her lab data were significant for a troponin I of 0.03, BNP of 385, potassium of 3.3, glucose of 182, WBC of 12.6, TSH of 15.56. Chest x-ray revealed vascular congestion and chronic bronchitic change. EKG revealed a heart rate of 94, atrial flutter, and prolonged QT interval. She was admitted for further evaluation and management.   Assessment & Plan:   Principal Problem:   Acute on chronic systolic heart failure (HCC) Active Problems:   Paroxysmal atrial flutter (HCC)   Elevated troponin I level   Coronary artery disease   Hypothyroidism   GERD (gastroesophageal reflux disease)   Type 2 diabetes mellitus with other specified complication (HCC)   Ischemic cardiomyopathy   Chronic combined systolic (congestive) and diastolic (congestive) heart failure (HCC)   1. Acute on chronic systolic/diastolic heart failure/ischemic cardiomyopathy/CAD/history CABG 2003. The patient is treated chronically with carvedilol and Lasix (20 mg alternating with 40 mg). Her echo 9/17 revealed an EF of 35-40% and moderate diastolic dysfunction. -Lasix was started at 40 mg IV daily. Carvedilol was continued. -Cardiology was consulted. Dr. Diona Browner added  low-dose ARB with losartan 12.5 mg daily and Lanoxin to help with rate control for recurring age or flutter. -The patient has diuresed 840 cc (likely more accounting for low was diuresed in the ED). -Her troponin I is marginal and virtually flat at 0.03-0.05. She denies outright anginal type chest pain. -Repeat echo results pending.  Paroxysmal atrial flutter. Per cardiology, her rate was in sinus rhythm in April 2018. She declined antiarrhythmic therapy based on prior workup. -She is treated chronically with carvedilol for rate control and Eliquis for anticoagulation. Both were continued. -Cardiology added Lanoxin as Dr. Diona Browner stated that the primary issue could be recurring atrial flutter. He anticipated arranging outpatient follow-up with EP to discuss rhythm treatment options, potentially ablation. -We'll order a follow-up EKG 7/1.  Type 2 diabetes mellitus. -Patient reports a history of prediabetes. She reported that her A1c was 5.5-5.8. Her diabetes had been treated without insulin or oral agents. -Patient's venous glucose/CBGs have been elevated in the 150s to the 190s range. -A1c on 6/29 was 7.0. -SSI started in the hospital. Will add bedtime Lantus. Change diet to carb modified. -Consider outpatient treatment with Lantus.  Hypothyroidism. Per admitting physician, the patient has a history of hypothyroidism and apparently, her TSH had been in the 80s. She is followed by her PCP. -On admission, her TSH was 15.56. Total T4 was 8.6. The dose of Synthroid was increased to 150 g. -The patient will need a follow-up TSH and free T4 in 4-8 weeks.   DVT prophylaxis: Eliquis Code Status: Full code Family Communication: Family not available Disposition Plan: Discharge when clinically appropriate, likely in the next 24-48 hours.   Consultants:  Cardiology  Procedures:  Echo pending 6/30:  Antimicrobials:  None   Subjective: Patient denies chest pain, but she says that her  breathing feels "heavy". She doesn't feel as if she is wheezing as much. She denies chest pain or palpitations currently.  Objective: Vitals:   12/25/16 0432 12/25/16 0837 12/25/16 0848 12/25/16 1035  BP: 115/83   115/63  Pulse: 94 94  95  Resp: 18     Temp: 98.3 F (36.8 C)     TempSrc: Oral     SpO2: 100%   97%  Weight: 83 kg (182 lb 15.7 oz)  83.1 kg (183 lb 4.8 oz)   Height:        Intake/Output Summary (Last 24 hours) at 12/25/16 1255 Last data filed at 12/25/16 1229  Gross per 24 hour  Intake                0 ml  Output             1000 ml  Net            -1000 ml   Filed Weights   12/24/16 1441 12/25/16 0432 12/25/16 0848  Weight: 83 kg (183 lb 1 oz) 83 kg (182 lb 15.7 oz) 83.1 kg (183 lb 4.8 oz)    Examination:  General exam: Appears calm and comfortable Respiratory system: Clear to auscultation. Respiratory effort normal. Cardiovascular system: S1, S2, with ectopy. No pedal edema. Gastrointestinal system: Abdomen is nondistended, soft and nontender. No organomegaly or masses felt. Normal bowel sounds heard. Central nervous system: Alert and oriented. No focal neurological deficits. Extremities: No acute Hydrea joints. Skin: No rashes, lesions or ulcers Psychiatry: Judgement and insight appear normal. Mood & affect appropriate.     Data Reviewed: I have personally reviewed following labs and imaging studies  CBC:  Recent Labs Lab 12/24/16 0829  WBC 12.6*  HGB 12.2  HCT 37.5  MCV 88.9  PLT 278   Basic Metabolic Panel:  Recent Labs Lab 12/24/16 0829 12/25/16 0434  NA 137 141  K 3.3* 3.6  CL 104 106  CO2 22 24  GLUCOSE 182* 155*  BUN 34* 25*  CREATININE 0.86 0.98  CALCIUM 9.0 9.0   GFR: Estimated Creatinine Clearance: 55.3 mL/min (by C-G formula based on SCr of 0.98 mg/dL). Liver Function Tests: No results for input(s): AST, ALT, ALKPHOS, BILITOT, PROT, ALBUMIN in the last 168 hours. No results for input(s): LIPASE, AMYLASE in the last 168  hours. No results for input(s): AMMONIA in the last 168 hours. Coagulation Profile: No results for input(s): INR, PROTIME in the last 168 hours. Cardiac Enzymes:  Recent Labs Lab 12/24/16 0829 12/24/16 1522 12/24/16 2018 12/25/16 0434  TROPONINI 0.03* 0.03* 0.03* 0.05*   BNP (last 3 results) No results for input(s): PROBNP in the last 8760 hours. HbA1C:  Recent Labs  12/24/16 0829  HGBA1C 7.0*   CBG:  Recent Labs Lab 12/24/16 1639 12/24/16 2238 12/25/16 0748 12/25/16 1148  GLUCAP 193* 147* 135* 194*   Lipid Profile: No results for input(s): CHOL, HDL, LDLCALC, TRIG, CHOLHDL, LDLDIRECT in the last 72 hours. Thyroid Function Tests:  Recent Labs  12/24/16 0824  TSH 15.569*  T4TOTAL 8.6   Anemia Panel: No results for input(s): VITAMINB12, FOLATE, FERRITIN, TIBC, IRON, RETICCTPCT in the last 72 hours. Sepsis Labs:  Recent Labs Lab 12/24/16 0829  PROCALCITON <0.10    No results found for this or any previous visit (from the past 240 hour(s)).  Radiology Studies: Dg Chest 2 View  Result Date: 12/24/2016 CLINICAL DATA:  Chest pain and shortness of breath EXAM: CHEST  2 VIEW COMPARISON:  None available FINDINGS: Remote median sternotomy. Moderate cardiomegaly with central vascular congestion and chronic bronchitic change. No focal pneumonia, collapse or consolidation. Negative for edema, effusion or pneumothorax. Trachea is midline. Atherosclerosis of the aorta. Degenerative changes of the spine. Bones are osteopenic. IMPRESSION: Cardiomegaly with vascular congestion and chronic bronchitic change centrally. No superimposed CHF or pneumonia. Thoracic aortic atherosclerosis Electronically Signed   By: Judie Petit.  Shick M.D.   On: 12/24/2016 09:30        Scheduled Meds: . apixaban  5 mg Oral BID  . carvedilol  12.5 mg Oral BID WC  . digoxin  0.125 mg Oral Daily  . furosemide  40 mg Intravenous Daily  . insulin aspart  0-9 Units Subcutaneous TID WC  .  levothyroxine  150 mcg Oral QHS  . losartan  12.5 mg Oral Daily  . magnesium oxide  200 mg Oral BID  . pantoprazole  40 mg Oral Daily  . sodium chloride flush  3 mL Intravenous Q12H   Continuous Infusions: . sodium chloride       LOS: 0 days    Time spent: 35 minutes    Elliot Cousin, MD [facility] Pager [phone]  If 7PM-7AM, please contact night-coverage [email or web address] Password [email or web address] 12/25/2016, 12:55 PM

## 2016-12-25 NOTE — Progress Notes (Signed)
  Echocardiogram 2D Echocardiogram has been performed.  [name], [name] M 12/25/2016, 12:31 PM

## 2016-12-26 DIAGNOSIS — R748 Abnormal levels of other serum enzymes: Secondary | ICD-10-CM | POA: Diagnosis not present

## 2016-12-26 DIAGNOSIS — I2510 Atherosclerotic heart disease of native coronary artery without angina pectoris: Secondary | ICD-10-CM | POA: Diagnosis not present

## 2016-12-26 DIAGNOSIS — E039 Hypothyroidism, unspecified: Secondary | ICD-10-CM | POA: Diagnosis not present

## 2016-12-26 DIAGNOSIS — I5042 Chronic combined systolic (congestive) and diastolic (congestive) heart failure: Secondary | ICD-10-CM | POA: Diagnosis not present

## 2016-12-26 DIAGNOSIS — I5023 Acute on chronic systolic (congestive) heart failure: Secondary | ICD-10-CM | POA: Diagnosis not present

## 2016-12-26 DIAGNOSIS — I4892 Unspecified atrial flutter: Secondary | ICD-10-CM | POA: Diagnosis not present

## 2016-12-26 LAB — BASIC METABOLIC PANEL
Anion gap: 8 (ref 5–15)
BUN: 26 mg/dL — AB (ref 6–20)
CO2: 27 mmol/L (ref 22–32)
Calcium: 9 mg/dL (ref 8.9–10.3)
Chloride: 102 mmol/L (ref 101–111)
Creatinine, Ser: 0.98 mg/dL (ref 0.44–1.00)
GFR calc Af Amer: >60 mL/min (ref >60)
GFR, EST NON AFRICAN AMERICAN: 58 mL/min — AB (ref >60)
GLUCOSE: 143 mg/dL — AB (ref 65–99)
POTASSIUM: 4.3 mmol/L (ref 3.5–5.1)
Sodium: 137 mmol/L (ref 135–145)

## 2016-12-26 LAB — PROCALCITONIN

## 2016-12-26 LAB — CBC
HEMATOCRIT: 37.2 % (ref 36.0–46.0)
Hemoglobin: 12.1 g/dL (ref 12.0–15.0)
MCH: 29.4 pg (ref 26.0–34.0)
MCHC: 32.5 g/dL (ref 30.0–36.0)
MCV: 90.5 fL (ref 78.0–100.0)
Platelets: 277 10*3/uL (ref 150–400)
RBC: 4.11 MIL/uL (ref 3.87–5.11)
RDW: 14.7 % (ref 11.5–15.5)
WBC: 10.5 10*3/uL (ref 4.0–10.5)

## 2016-12-26 LAB — TROPONIN I: Troponin I: <0.03 ng/mL (ref <0.03)

## 2016-12-26 LAB — GLUCOSE, CAPILLARY
GLUCOSE-CAPILLARY: 124 mg/dL — AB (ref 65–99)
Glucose-Capillary: 147 mg/dL — ABNORMAL HIGH (ref 65–99)

## 2016-12-26 MED ORDER — LEVOTHYROXINE SODIUM 150 MCG PO TABS: 150.0000 ug | ORAL_TABLET | Freq: Every day | ORAL | 2 refills | Status: DC

## 2016-12-26 MED ORDER — DIGOXIN 125 MCG PO TABS: 0.1250 mg | ORAL_TABLET | Freq: Every day | ORAL | 2 refills | Status: DC

## 2016-12-26 MED ORDER — METFORMIN HCL 500 MG PO TABS: 500.0000 mg | ORAL_TABLET | Freq: Every day | ORAL | 3 refills | Status: DC

## 2016-12-26 MED ORDER — LOSARTAN POTASSIUM 25 MG PO TABS: 12.5000 mg | ORAL_TABLET | Freq: Every day | ORAL | 2 refills | Status: DC

## 2016-12-26 MED ORDER — POTASSIUM CHLORIDE CRYS ER 10 MEQ PO TBCR: 10.0000 meq | EXTENDED_RELEASE_TABLET | Freq: Every day | ORAL | 2 refills | Status: DC

## 2016-12-26 MED ORDER — FUROSEMIDE 20 MG PO TABS
20.0000 mg | ORAL_TABLET | Freq: Every day | ORAL | Status: DC
  Administered 2016-12-26: 20 mg via ORAL
  Filled 2016-12-26: qty 1

## 2016-12-26 NOTE — Plan of Care (Signed)
Problem: Pain Managment: Goal: General experience of comfort will improve Outcome: Progressing Pt has no c/o pain this shift; pt up ad lib in room. Pt walked hallway with no c/o shortness of breath. Will continue to monitor pt

## 2016-12-26 NOTE — Discharge Summary (Signed)
Physician Discharge Summary  [name] BJY:[id number] DOB: Feb 26, 1946 DOA: 12/24/2016  PCP: Juliette Alcide, MD  Admit date: 12/24/2016 Discharge date: 12/26/2016  Time spent: Greater than 30 minutes  Recommendations for Outpatient Follow-up:  1. Patient will follow-up with cardiology as scheduled in a few days.  2. Recommend follow-up TSH and free T4 in 4-8 weeks to evaluate for adjustment of recent increase in Synthroid dose.   Discharge Diagnoses:  Principal Problem:   Acute on chronic systolic heart failure (HCC) Active Problems:   Paroxysmal atrial flutter (HCC)   Elevated troponin I level   Coronary artery disease   Hypothyroidism   GERD (gastroesophageal reflux disease)   Type 2 diabetes mellitus with other specified complication (HCC)   Ischemic cardiomyopathy   Chronic combined systolic (congestive) and diastolic (congestive) heart failure (HCC)   Atrial flutter by electrocardiogram [facility])   Discharge Condition: Improved  Diet recommendation: Carbohydrate modified/heart healthy  Filed Weights   12/25/16 0432 12/25/16 0848 12/26/16 0454  Weight: 83 kg (182 lb 15.7 oz) 83.1 kg (183 lb 4.8 oz) 83.2 kg (183 lb 6.4 oz)    History of present illness:  Patient is a 71 year old woman with a history of CAD, status post CABG in 2003, 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 She was recently treated with a prednisone taper due to presumed COPD exacerbation or bronchitis. Following the prednisone, she gained 10 or 11 pounds of water rate, so she increased her Lasix and was able to diuresed back to her base weight of 182 pounds. In the ED, she was afebrile and hemodynamically stable. Her lab data were significant for a troponin I of 0.03, BNP of 385, potassium of 3.3, glucose of 182, WBC of 12.6,  TSH of 15.56. Chest x-ray revealed vascular congestion and chronic bronchitic change. EKG revealed a heart rate of 94, atrial flutter, and prolonged QT interval. She was admitted for further evaluation and management.  Hospital Course:  1. Acute on chronic systolic/diastolic heart failure/ischemic cardiomyopathy/CAD/history CABG 2003. The patient is treated chronically with carvedilol and Lasix (20 mg alternating with 40 mg). Her echo 9/17 revealed an EF of 35-40% and moderate diastolic dysfunction. -Lasix was started at 40 mg IV daily. Carvedilol was continued. -Cardiology was consulted. Dr. Diona Browner added low-dose ARB with losartan 12.5 mg daily and Lanoxin to help with rate control for recurring atrial flutter. -Her troponin I was marginal and remained virtually flat at 0.03-0.05. She denied outright anginal type chest pain. -She diuresed more than 1 L, but likely more due to it accurate measurements. -Repeat echo ordered and revealed a slightly worse EF of 25-30% compared to the previous EF. -She will follow-up with cardiology in a few days as are rescheduled. She was discharged on her home dose of Lasix, carvedilol, losartan, and Lanoxin.  Paroxysmal atrial flutter. Per cardiology, her rate was in sinus rhythm in April 2018. She declined antiarrhythmic therapy based on prior workup. -She is treated chronically with carvedilol for rate control and Eliquis for anticoagulation. Both were continued. -Cardiology added Lanoxin as Dr. Diona Browner stated that the primary issue could be recurring atrial flutter. He anticipated arranging outpatient follow-up with EP to discuss rhythm treatment options, potentially ablation. -Follow-up EKG revealed atrial flutter with a heart rate of 94 bpm.  Type 2 diabetes mellitus. -Patient reported a history of prediabetes.  She reported that her A1c was 5.5-5.8. Her diabetes had been treated without insulin or oral agents recently, although she had been treated with  metformin in the past. -Patient's venous glucose/CBGs were elevated in the 150s to the 190s range. -Another hemoglobin A1c was ordered for evaluation and was 7.0. She was informed of this and agreed to restart metformin. Therefore she was discharged on metformin 500 mg daily.  Hypothyroidism. The patient has a history of hypothyroidism and apparently her TSH had been in the 80s per her account. She is followed by her PCP for management. -On admission, her TSH was 15.56. Total T4 was 8.6. The dose of Synthroid was increased to 150 g. she was discharged on this dose. -The patient will need a follow-up TSH and free T4 in 4-8 weeks.     Procedures: 2-D echocardiogram 12/25/16: Study Conclusions - Left ventricle: The cavity size was mildly dilated. Wall   thickness was normal. Systolic function was severely reduced. The   estimated ejection fraction was in the range of 25% to 30%. There   is akinesis of the mid-apicalanteroseptal and apical myocardium.   There is severe hypokinesis of the apicalinferior myocardium. The   study is not technically sufficient to allow evaluation of LV   diastolic function. - Aortic valve: Trileaflet; mildly calcified leaflets. - Mitral valve: Mildly thickened leaflets . There was mild   regurgitation. - Right ventricle: Systolic function was moderately reduced. - Tricuspid valve: There was mild regurgitation. - Pulmonary arteries: PA peak pressure: 40 mm Hg (S). - Pericardium, extracardiac: There was no pericardial effusion Impressions: - Normal LV wall thickness with mild chamber dilatation and LVEF   approximately 25-30%. There is mid to apical anteroseptal and   apical akinesis with severe hypokinesis of the apical inferior   wall. Indeterminate diastolic function. Mildly thickened mitral   leaflets with mild mitral regurgitation. Mildly sclerotic aortic   valve. Moderately reduced right ventricular contraction. Mild    tricuspid regurgitation with  PASP estimated 40 mmHg.  Consultations:  Cardiology  Discharge Exam: Vitals:   12/26/16 0821 12/26/16 1114  BP:    Pulse: 94 94  Resp:    Temp:    Temperature 97.9. Pulse 98. Respiratory rate 18. Blood pressure 118/87. Oxygen saturation 99% on room air.  General: Pleasant alert 71 year old woman in no acute distress. Cardiovascular: S1, S2, with occasional ectopy. No pedal edema. Respiratory: Lungs clear to auscultation bilaterally.  Discharge Instructions   Discharge Instructions    Activity as tolerated - No restrictions    Complete by:  As directed    Diet - low sodium heart healthy    Complete by:  As directed    Diet Carb Modified    Complete by:  As directed    Increase activity slowly    Complete by:  As directed      Current Discharge Medication List    START taking these medications   Details  digoxin (LANOXIN) 0.125 MG tablet Take 1 tablet (0.125 mg total) by mouth daily. Qty: 30 tablet, Refills: 2    losartan (COZAAR) 25 MG tablet Take 0.5 tablets (12.5 mg total) by mouth daily. Qty: 15 tablet, Refills: 2    metFORMIN (GLUCOPHAGE) 500 MG tablet Take 1 tablet (500 mg total) by mouth daily with breakfast. Qty: 30 tablet, Refills: 3    potassium chloride SA (K-DUR,KLOR-CON) 10 MEQ tablet Take 1 tablet (10 mEq total) by mouth daily. Qty: 30 tablet, Refills: 2  CONTINUE these medications which have CHANGED   Details  levothyroxine (SYNTHROID, LEVOTHROID) 150 MCG tablet Take 1 tablet (150 mcg total) by mouth at bedtime. Qty: 30 tablet, Refills: 2      CONTINUE these medications which have NOT CHANGED   Details  acetaminophen (TYLENOL) 500 MG tablet Take 1,000 mg by mouth every 6 (six) hours as needed for moderate pain.    albuterol (PROVENTIL HFA;VENTOLIN HFA) 108 (90 Base) MCG/ACT inhaler Inhale 1 puff into the lungs every 6 (six) hours as needed for wheezing or shortness of breath.    apixaban (ELIQUIS) 5 MG TABS tablet Take 1 tablet (5 mg  total) by mouth 2 (two) times daily. Qty: 60 tablet, Refills: 8    carvedilol (COREG) 12.5 MG tablet Take 12.5 mg by mouth 2 (two) times daily with a meal.    Cholecalciferol (VITAMIN D3 PO) Take 1 tablet by mouth daily.    furosemide (LASIX) 20 MG tablet Take 20-40 mg by mouth daily. Alternating 20 mg and 40 mg every other day.    Magnesium 250 MG TABS Take 250 mg by mouth 2 (two) times daily.    nitroGLYCERIN (NITRODUR - DOSED IN MG/24 HR) 0.2 mg/hr patch PLACE 1 PATCH ONTO THE SKIN DAILY AS NEEDED FOR CHEST PAINS Qty: 90 patch, Refills: 3    omeprazole (PRILOSEC) 40 MG capsule Take 40 mg by mouth daily.        Allergies  Allergen Reactions  . Oxycodone Other (See Comments)    hallucinations   . Pollen Extract Other (See Comments)    Sneezing, running nose.  . Statins Other (See Comments)    Cramping    Follow-up Information    Jodelle GrossLawrence, Kathryn M, NP Follow up today.   Specialties:  Nurse Practitioner, Radiology, Cardiology Why:  FOLLOW UP AS SCHEDULED Contact information: [address] [place] [id number] [phone]        Juliette AlcideBurdine, Steven E, MD. Schedule an appointment as soon as possible for a visit in 2 week(s).   Specialty:  Family Medicine Contact information: [address] [place] [address] [phone]            The results of significant diagnostics from this hospitalization (including imaging, microbiology, ancillary and laboratory) are listed below for reference.    Significant Diagnostic Studies: Dg Chest 2 View  Result Date: 12/24/2016 CLINICAL DATA:  Chest pain and shortness of breath EXAM: CHEST  2 VIEW COMPARISON:  None available FINDINGS: Remote median sternotomy. Moderate cardiomegaly with central vascular congestion and chronic bronchitic change. No focal pneumonia, collapse or consolidation. Negative for edema, effusion or pneumothorax. Trachea is midline. Atherosclerosis of the aorta. Degenerative changes of the spine. Bones are  osteopenic. IMPRESSION: Cardiomegaly with vascular congestion and chronic bronchitic change centrally. No superimposed CHF or pneumonia. Thoracic aortic atherosclerosis Electronically Signed   By: Judie PetitM.  Shick M.D.   On: 12/24/2016 09:30    Microbiology: No results found for this or any previous visit (from the past 240 hour(s)).   Labs: Basic Metabolic Panel:  Recent Labs Lab 12/24/16 0829 12/25/16 0434 12/26/16 0634  NA 137 141 137  K 3.3* 3.6 4.3  CL 104 106 102  CO2 22 24 27   GLUCOSE 182* 155* 143*  BUN 34* 25* 26*  CREATININE 0.86 0.98 0.98  CALCIUM 9.0 9.0 9.0   Liver Function Tests: No results for input(s): AST, ALT, ALKPHOS, BILITOT, PROT, ALBUMIN in the last 168 hours. No results for input(s): LIPASE, AMYLASE in  the last 168 hours. No results for input(s): AMMONIA in the last 168 hours. CBC:  Recent Labs Lab 12/24/16 0829 12/26/16 0634  WBC 12.6* 10.5  HGB 12.2 12.1  HCT 37.5 37.2  MCV 88.9 90.5  PLT 278 277   Cardiac Enzymes:  Recent Labs Lab 12/24/16 0829 12/24/16 1522 12/24/16 2018 12/25/16 0434 12/26/16 0634  TROPONINI 0.03* 0.03* 0.03* 0.05* <0.03   BNP: BNP (last 3 results)  Recent Labs  12/24/16 0824  BNP 385.0*    ProBNP (last 3 results) No results for input(s): PROBNP in the last 8760 hours.  CBG:  Recent Labs Lab 12/25/16 1148 12/25/16 1618 12/25/16 2047 12/26/16 0750 12/26/16 1145  GLUCAP 194* 130* 167* 124* 147*       Signed:  Aneyah Lortz MD.  [facility] 12/26/2016, 12:40 PM

## 2016-12-26 NOTE — Progress Notes (Signed)
Patient's IV removed.  Site WNL.  AVS reviewed with patient.  Verbalized understanding of discharge instructions, MD follow-up, medications. Prescriptions given to patient at discharge. Patient transported by NT via wheelchair to entrance at discharge.  Patient stable at time of discharge.

## 2016-12-28 ENCOUNTER — Ambulatory Visit (INDEPENDENT_AMBULATORY_CARE_PROVIDER_SITE_OTHER): Payer: Medicare Other | Admitting: Adult Health

## 2016-12-28 ENCOUNTER — Encounter: Payer: Self-pay | Admitting: Adult Health

## 2016-12-28 VITALS — BP 118/68 | HR 81 | Ht 63.0 in | Wt 185.0 lb

## 2016-12-28 DIAGNOSIS — I420 Dilated cardiomyopathy: Secondary | ICD-10-CM

## 2016-12-28 DIAGNOSIS — I480 Paroxysmal atrial fibrillation: Secondary | ICD-10-CM

## 2016-12-28 DIAGNOSIS — I255 Ischemic cardiomyopathy: Secondary | ICD-10-CM | POA: Diagnosis not present

## 2016-12-28 DIAGNOSIS — I10 Essential (primary) hypertension: Secondary | ICD-10-CM

## 2016-12-28 DIAGNOSIS — I739 Peripheral vascular disease, unspecified: Secondary | ICD-10-CM | POA: Diagnosis not present

## 2016-12-28 MED ORDER — CARVEDILOL 12.5 MG PO TABS: 18.7500 mg | ORAL_TABLET | Freq: Two times a day (BID) | ORAL | 3 refills | Status: DC

## 2016-12-28 MED ORDER — LOSARTAN POTASSIUM 25 MG PO TABS: 25.0000 mg | ORAL_TABLET | Freq: Every day | ORAL | 3 refills | Status: DC

## 2016-12-28 NOTE — Progress Notes (Signed)
Cardiology Office Note   Date:  12/28/2016   ID:  [name] LoaMary G Shadle, DOB July 01, 1945, MRN [id number]  PCP:  Juliette AlcideBurdine, Steven E, MD  Cardiologist:  Diona BrownerMcDowell  Chief Complaint  Patient presents with  . Hospitalization Follow-up  . Cardiomyopathy  . Atrial Fibrillation  . Congestive Heart Failure      History of Present Illness: [name] is a 71 y.o. female who presents for posthospitalization follow-up after we saw the patient on consultation in the setting of dyspnea on exertion and fatigue. The patient has a history of coronary artery disease status post coronary artery bypass grafting in 2003 chronic systolic heart failure with EF of 35-40%, type 2 diabetes, hyperlipidemia, hypertension, paroxysmal atrial flutter on ELIQUIS.  During hospitalization she was found to be in atrial flutter, which was symptomatic. She was ruled out for ACS, and treated for decompensated heart failure. After diuresis the patient was started on losartan and Lanoxin. Repeat echocardiogram revealed slightly worse EF of 25% to 30% compared to previous EF. She was continued on anticoagulation. Consideration for referral to EP will be made to discuss possibility of ablation therapy. She was in atrial flutter with a heart rate of 94 bpm on discharge.  Today she states that she did have rapid heart rate when she first awakened in the morning. She states that she rested and it went away on its own within a few and its. She denies dizziness, near syncope, dyspnea, weight gain. Her energy level remains low but she continues to do her best with ambulation, visiting family member with a cardiomyopathy as well. Walking up and down the hallway to visit him. She denies any bleeding symptoms from anticoagulation therapy.  Of note, she has been taking her losartan at a higher dose and originally prescribed. She was prescribed losartan 12.5 mg daily but has been taking a full 25 mg daily.  Past Medical History:  Diagnosis Date  .  CAD (coronary artery disease)    Multivessel status post CABG in 2003  . Chronic combined systolic (congestive) and diastolic (congestive) heart failure (HCC) 12/25/2016  . Chronic systolic heart failure (HCC)   . Essential hypertension   . GERD (gastroesophageal reflux disease)   . Gum disease   . History of atrial flutter    [place]  . History of colonic polyps   . History of goiter   . Hyperlipidemia   . Hypothyroidism   . Ischemic cardiomyopathy 12/25/2016  . Myocardial infarction (HCC)    2003  . Peripheral arterial disease (HCC)    Stent revascularization 2015 - details not clear  . Type 2 diabetes mellitus (HCC)     Past Surgical History:  Procedure Laterality Date  . CHOLECYSTECTOMY     2012  . CORONARY ARTERY BYPASS GRAFT     2003  . PARATHYROIDECTOMY     2015  . THYROIDECTOMY    . TONSILLECTOMY     1967     Current Outpatient Prescriptions  Medication Sig Dispense Refill  . acetaminophen (TYLENOL) 500 MG tablet Take 1,000 mg by mouth every 6 (six) hours as needed for moderate pain.    Marland Kitchen. albuterol (PROVENTIL HFA;VENTOLIN HFA) 108 (90 Base) MCG/ACT inhaler Inhale 1 puff into the lungs every 6 (six) hours as needed for wheezing or shortness of breath.    Marland Kitchen. apixaban (ELIQUIS) 5 MG TABS tablet Take 1 tablet (5 mg total) by mouth 2 (two) times daily. 60 tablet 8  . Cholecalciferol (VITAMIN D3  PO) Take 1 tablet by mouth daily.    . digoxin (LANOXIN) 0.125 MG tablet Take 1 tablet (0.125 mg total) by mouth daily. 30 tablet 2  . furosemide (LASIX) 20 MG tablet Take 20-40 mg by mouth daily. Alternating 20 mg and 40 mg every other day.    . levothyroxine (SYNTHROID, LEVOTHROID) 150 MCG tablet Take 1 tablet (150 mcg total) by mouth at bedtime. 30 tablet 2  . Magnesium 250 MG TABS Take 250 mg by mouth 2 (two) times daily.    . metFORMIN (GLUCOPHAGE) 500 MG tablet Take 1 tablet (500 mg total) by mouth daily with breakfast. 30 tablet 3  . nitroGLYCERIN  (NITRODUR - DOSED IN MG/24 HR) 0.2 mg/hr patch PLACE 1 PATCH ONTO THE SKIN DAILY AS NEEDED FOR CHEST PAINS 90 patch 3  . omeprazole (PRILOSEC) 40 MG capsule Take 40 mg by mouth daily.     . potassium chloride SA (K-DUR,KLOR-CON) 10 MEQ tablet Take 1 tablet (10 mEq total) by mouth daily. 30 tablet 2  . carvedilol (COREG) 12.5 MG tablet Take 1.5 tablets (18.75 mg total) by mouth 2 (two) times daily. 270 tablet 3  . losartan (COZAAR) 25 MG tablet Take 1 tablet (25 mg total) by mouth daily. 90 tablet 3   No current facility-administered medications for this visit.     Allergies:   Oxycodone; Pollen extract; and Statins    Social History:  The patient  reports that she has quit smoking. Her smoking use included Cigarettes. She has never used smokeless tobacco. She reports that she does not drink alcohol or use drugs.   Family History:  The patient's family history includes Breast cancer in her maternal aunt and paternal aunt; Heart attack in her father; Stroke in her mother.    ROS: All other systems are reviewed and negative. Unless otherwise mentioned in H&P    PHYSICAL EXAM: VS:  BP 118/68   Pulse 81   Ht 5\' 3"  (1.6 m)   Wt 185 lb (83.9 kg)   SpO2 94% Comment: on room air  BMI 32.77 kg/m  , BMI Body mass index is 32.77 kg/m. GEN: Well nourished, well developed, in no acute distress  HEENT: normal  Neck: no JVD, carotid bruits, or masses Cardiac: RRR, tachycardic; no murmurs, rubs, or gallops, 1+ ankle edema  Respiratory:  clear to auscultation bilaterally, normal work of breathing GI: soft, nontender, nondistended, + BS MS: no deformity or atrophy  Skin: warm and dry, no rash Neuro:  Strength and sensation are intact Psych: euthymic mood, full affect  Recent Labs: 03/19/2016: ALT 13 08/18/2016: Magnesium 0.5 12/24/2016: B Natriuretic Peptide 385.0; TSH 15.569 12/26/2016: BUN 26; Creatinine, Ser 0.98; Hemoglobin 12.1; Platelets 277; Potassium 4.3; Sodium 137     Echocardiogram  12/25/2016 Left ventricle: The cavity size was mildly dilated. Wall   thickness was normal. Systolic function was severely reduced. The   estimated ejection fraction was in the range of 25% to 30%. There   is akinesis of the mid-apicalanteroseptal and apical myocardium.   There is severe hypokinesis of the apicalinferior myocardium. The   study is not technically sufficient to allow evaluation of LV   diastolic function. - Aortic valve: Trileaflet; mildly calcified leaflets. - Mitral valve: Mildly thickened leaflets . There was mild   regurgitation. - Right ventricle: Systolic function was moderately reduced. - Tricuspid valve: There was mild regurgitation. - Pulmonary arteries: PA peak pressure: 40 mm Hg (S). - Pericardium, extracardiac: There was no pericardial effusion.  Lexiscan Myoview 11/30/2014 [facility] [place]): Medium to large region of scar in the apical septal, apical inferior, apical lateral, and apex with mild peri-infarct ischemia. Medium region of scar with mild amount of peri-infarct ischemia also noted in the basal inferolateral and mid inferolateral walls. LVEF 35%.   ASSESSMENT AND PLAN:  1. Paroxysmal atrial fibrillation: Heart rate is not currently well-controlled especially in the setting of systolic dysfunction. I will increase her carvedilol to 18.75 mg twice a day (one and one half tablet of the 12.5 mg she is already taking). She will come back in a couple weeks for blood pressure and heart rate check with nurses and see [place] in the office in one month. She will continue ELIQUIS as directed. If she continues to have rapid heart rhythm breakthroughs which are symptomatic, and not resolved on their own, she is to call us. She may be referred to EP as suggested by Dr. Diona Browner, her primary cardiologist, if heart rate is not well controlled with increased dose of carvedilol.CHADS VASC Score of 6.  2. Systolic dysfunction with ischemic cardiomyopathy: Most recent  echocardiogram revealed an EF of 25-30%, (reduced from 35-40% per echocardiogram September 2017). Would like to optimize her medical management concerning this. Consider Entresto, and discontinue ARB. However will not want to make a lot of changes at once. We'll go up on the carvedilol for now. She is to continue her Lasix as directed. Continue daily weights and avoidance of salt. I have counseled her on her heart function, and need to have heart rate control and blood pressure control in the setting of reduced EF. When she is on all 4 medications, can consider repeating her echocardiogram for improvement in her LV function. May also need to consider ICD implantation, if she is referred to EP for atrial fibrillation. She has declined ICD in the past.  3. Hypertension: The patient's blood pressure is low normal. With reduced EF will try to lower the blood pressure little more increased dose of carvedilol which is also helping heart rate. Close follow-up to evaluate her response to medication. Can consider adding Entresto as discussed above. We'll leave her at higher dose of losartan 25 mg daily that she has been taking in air are as her blood pressure is well-controlled at this higher dose and she is asymptomatic.  4. Coronary artery disease: History of multivessel coronary artery disease status post coronary artery bypass grafting in 2003. She denies recurrent symptoms of angina. Most recent Lexiscan Myoview was completed in June 2016 in [place], with medium to large region of scar in apical segment, apical inferior, apical lateral, and apex with mild peri-infarct ischemia. LVEF is 35%. Continue risk management.  5. Peripheral arterial disease: Most recent ABIs were documented by Dr. Diona Browner on office visit in October 2017, please see above results. No plans to refer her to vascular, as she is asymptomatic currently for symptoms of intermittent claudication.     Current medicines are  reviewed at length with the patient today.    Labs/ tests ordered today include: None.  Bettey Mare. Liborio Nixon, ANP, AACC   12/28/2016 4:44 PM    [facility] [address] [place], [place] [address] Phone: [phone]; Fax: [phone]

## 2016-12-28 NOTE — Patient Instructions (Addendum)
Your physician recommends that you schedule a follow-up appointment in: 2 Weeks for a Blood Pressure check   Your physician recommends that you schedule a follow-up appointment in: 1 Month   Your physician has recommended you make the following change in your medication:  Increase Losartan to 25 mg Daily   Increase Coreg to 18.75 mg ( 1 1/2 Tablet) Two Times Daily.   Call office if you become dizzy or increased heart rate   If you need a refill on your cardiac medications before your next appointment, please call your pharmacy.  Thank you for choosing [facility]!

## 2017-01-03 ENCOUNTER — Telehealth: Payer: Self-pay | Admitting: Adult Health

## 2017-01-03 DIAGNOSIS — I499 Cardiac arrhythmia, unspecified: Secondary | ICD-10-CM

## 2017-01-03 NOTE — Telephone Encounter (Addendum)
Order entered for holter, pt aware,will have front staff schedule with pt  LM offering her an 8 am or 1 pm apt tomorrow 01/04/17

## 2017-01-03 NOTE — Telephone Encounter (Signed)
She does not want to take over her suggested does of 37. 5 mg for 24 hr period, she just wanted to take 12.5 mg TID, vs 18.75 BID

## 2017-01-03 NOTE — Telephone Encounter (Signed)
Patient states pcp is on vacation and does not want to see anyone else.  She will continue to keep BP and HR recordings

## 2017-01-03 NOTE — Telephone Encounter (Signed)
Please give pt a call about her Afib

## 2017-01-03 NOTE — Telephone Encounter (Signed)
Coreg cannot be taken TID. This would be an overdose and cause significant hypotension. May need to consider seeing PCO for assistance with anxiety medications. Can discuss increasing dose on next appointment based upon HR and BP recordings.

## 2017-01-03 NOTE — Telephone Encounter (Addendum)
Pt states that she is still break through fluttering esp at night.She wants to take Coreg 12.5 mg TID vs 37.5 mg BID. She feels that would give her better coverage. Her spouse is going to rehab now and she has her hands full. Feels she is stressed,and gets SOB with little walking

## 2017-01-03 NOTE — Telephone Encounter (Signed)
She needs to be seen to make decision on her medication dosage and evaluate HR and BP. Have 48 hour Holter monitor placed first and then will be able to see what HR is doing before changing dose, please.

## 2017-01-03 NOTE — Addendum Note (Signed)
Addended by: Marlyn CorporalARLTON, CATHERINE A on: 01/03/2017 04:30 PM   Modules accepted: Orders

## 2017-01-04 ENCOUNTER — Ambulatory Visit (HOSPITAL_COMMUNITY)
Admission: RE | Admit: 2017-01-04 | Discharge: 2017-01-04 | Disposition: A | Discharge status: Home / Self Care | Payer: Medicare Other | Source: Ambulatory Visit | Attending: Adult Health | Admitting: Adult Health

## 2017-01-04 DIAGNOSIS — I493 Ventricular premature depolarization: Secondary | ICD-10-CM | POA: Insufficient documentation

## 2017-01-04 DIAGNOSIS — I499 Cardiac arrhythmia, unspecified: Secondary | ICD-10-CM

## 2017-01-04 DIAGNOSIS — I4891 Unspecified atrial fibrillation: Secondary | ICD-10-CM | POA: Diagnosis not present

## 2017-01-11 ENCOUNTER — Telehealth: Payer: Self-pay | Admitting: *Deleted

## 2017-01-11 ENCOUNTER — Ambulatory Visit: Payer: Medicare Other | Admitting: *Deleted

## 2017-01-11 VITALS — BP 106/64 | HR 60 | Ht 63.0 in

## 2017-01-11 DIAGNOSIS — I5089 Other heart failure: Secondary | ICD-10-CM

## 2017-01-11 NOTE — Telephone Encounter (Signed)
-----   Message from Jodelle GrossKathryn M Lawrence, NP sent at 01/11/2017  2:53 PM EDT ----- Still in atrial fib, but HR average is 72 bpm.  Some elevations. She is having lots of PVC's Increase carvedilol to 18.75 mg BID, please.

## 2017-01-11 NOTE — Progress Notes (Signed)
Thank you,

## 2017-01-11 NOTE — Telephone Encounter (Signed)
Called patient with test results. No answer. Left message to call back.  

## 2017-01-11 NOTE — Progress Notes (Signed)
Patent states that she is feeling better. 106/64

## 2017-01-18 NOTE — Progress Notes (Signed)
Cardiology Office Note  Date: 01/19/2017   ID: [name], DOB 07-Mar-1946, MRN [id number]  PCP: Juliette Alcide, MD  Primary Cardiologist: Nona Dell, MD   Chief Complaint  Patient presents with  . Cardiomyopathy  . Atrial fibrillation/flutter    History of Present Illness: [name] is a 71 y.o. female seen recently in the office by Ms. Liborio Nixon in early July. She had been recently hospitalized with acute on chronic systolic heart failure, LVEF 25-30%, complicated by recurrent atrial flutter. She presents today for a follow-up visit. States that she does feel better in general. She has had less stress since her husband came home from the hospital following heart surgery.  CHADSVASC score is 6 and she continues on Eliquis. She has a history of recurrent atrial flutter and atrial fibrillation, and I had already mentioned the possibility of EP referral to discuss treatment options including ablation. She has declined antiarrhythmic therapy with amiodarone. Ms. Lyman Bishop DNP ordered a recent 48 hour Holter monitor which shows persistent atrial flutter with overall controlled heart rate (average 72, minimum 43, maximum 123), also occasional PVCs versus aberrantly conducted complexes. Her heart rate is actually regular today.  Recent follow-up echocardiogram in June revealed LVEF 25-30%. She has already declined ICD and this remains the case.  I reviewed her most recent lab work per Dr. Leandrew Koyanagi. She is trying to get her thyroid status under control, recent TSH 12, down from 15. This was apparently as high as 88 back in February.  Past Medical History:  Diagnosis Date  . CAD (coronary artery disease)    Multivessel status post CABG in 2003  . Chronic combined systolic (congestive) and diastolic (congestive) heart failure (HCC) 12/25/2016  . Chronic systolic heart failure (HCC)   . Essential hypertension   . GERD (gastroesophageal reflux disease)   . Gum disease   .  History of atrial flutter    Cardioversion 2016 in [place]  . History of colonic polyps   . History of goiter   . Hyperlipidemia   . Hypothyroidism   . Ischemic cardiomyopathy 12/25/2016  . Myocardial infarction (HCC)    2003  . Peripheral arterial disease (HCC)    Stent revascularization 2015 - details not clear  . Type 2 diabetes mellitus (HCC)     Past Surgical History:  Procedure Laterality Date  . CHOLECYSTECTOMY     2012  . CORONARY ARTERY BYPASS GRAFT     2003  . PARATHYROIDECTOMY     2015  . THYROIDECTOMY    . TONSILLECTOMY     1967    Current Outpatient Prescriptions  Medication Sig Dispense Refill  . acetaminophen (TYLENOL) 500 MG tablet Take 1,000 mg by mouth every 6 (six) hours as needed for moderate pain.    Marland Kitchen albuterol (PROVENTIL HFA;VENTOLIN HFA) 108 (90 Base) MCG/ACT inhaler Inhale 1 puff into the lungs every 6 (six) hours as needed for wheezing or shortness of breath.    Marland Kitchen apixaban (ELIQUIS) 5 MG TABS tablet Take 1 tablet (5 mg total) by mouth 2 (two) times daily. 60 tablet 8  . carvedilol (COREG) 12.5 MG tablet Take 1.5 tablets (18.75 mg total) by mouth 2 (two) times daily. 270 tablet 3  . Cholecalciferol (VITAMIN D3 PO) Take 1 tablet by mouth daily.    . digoxin (LANOXIN) 0.125 MG tablet Take 1 tablet (0.125 mg total) by mouth daily. 30 tablet 2  . furosemide (LASIX) 20 MG tablet Take 20-40 mg by mouth  daily. Alternating 20 mg and 40 mg every other day.    . levothyroxine (SYNTHROID, LEVOTHROID) 175 MCG tablet Take 175 mcg by mouth daily before breakfast.    . losartan (COZAAR) 25 MG tablet Take 1 tablet (25 mg total) by mouth daily. 90 tablet 3  . Magnesium 250 MG TABS Take 250 mg by mouth 2 (two) times daily.    . nitroGLYCERIN (NITRODUR - DOSED IN MG/24 HR) 0.2 mg/hr patch PLACE 1 PATCH ONTO THE SKIN DAILY AS NEEDED FOR CHEST PAINS 90 patch 3  . omeprazole (PRILOSEC) 40 MG capsule Take 40 mg by mouth daily.     . potassium chloride SA (K-DUR,KLOR-CON) 10  MEQ tablet Take 1 tablet (10 mEq total) by mouth daily. 30 tablet 2   No current facility-administered medications for this visit.    Allergies:  Oxycodone; Pollen extract; and Statins   Social History: The patient  reports that she has quit smoking. Her smoking use included Cigarettes. She has never used smokeless tobacco. She reports that she does not drink alcohol or use drugs.   ROS:  Please see the history of present illness. Otherwise, complete review of systems is positive for fatigue, improving insomnia.  All other systems are reviewed and negative.   Physical Exam: VS:  BP 120/64   Pulse (!) 58   Ht 5\' 3"  (1.6 m)   Wt 184 lb 3.2 oz (83.6 kg)   SpO2 99% Comment: on room air  BMI 32.63 kg/m , BMI Body mass index is 32.63 kg/m.  Wt Readings from Last 3 Encounters:  01/19/17 184 lb 3.2 oz (83.6 kg)  12/28/16 185 lb (83.9 kg)  12/26/16 183 lb 6.4 oz (83.2 kg)    General: Obese woman, appears comfortable at rest. HEENT: Conjunctiva and lids normal, oropharynx clear. Neck: Supple, no elevated JVP or carotid bruits, no thyromegaly. Lungs: Clear to auscultation, nonlabored breathing at rest. Cardiac: Regular rate and rhythm, no S3, soft systolic murmur, no pericardial rub. Abdomen: Soft, nontender, bowel sounds present. Extremities: No pitting edema, distal pulses 2+. Skin: Warm and dry. Musculoskeletal: No kyphosis. Neuropsychiatric: Alert and oriented x3, affect grossly appropriate.  ECG: I personally reviewed the tracing from 12/24/2016 which showed atrial flutter with variable conduction, possible old lateral infarct pattern.  Recent Labwork: 03/19/2016: ALT 13; AST 18 08/18/2016: Magnesium 0.5 12/24/2016: B Natriuretic Peptide 385.0; TSH 15.569 12/26/2016: BUN 26; Creatinine, Ser 0.98; Hemoglobin 12.1; Platelets 277; Potassium 4.3; Sodium 137  July 2018: BUN 21, creatinine 0.91, potassium 4.2, AST 18, ALT 12, TSH 12.27, magnesium 2.0  Other Studies Reviewed  Today:  Eugenie BirksLexiscan Myoview 11/30/2014 [facility]): Medium to large region of scar in the apical septal, apical inferior, apical lateral, and apex with mild peri-infarct ischemia. Medium region of scar with mild amount of peri-infarct ischemia also noted in the basal inferolateral and mid inferolateral walls. LVEF 35%.  Echocardiogram 12/25/2016: Study Conclusions  - Left ventricle: The cavity size was mildly dilated. Wall   thickness was normal. Systolic function was severely reduced. The   estimated ejection fraction was in the range of 25% to 30%. There   is akinesis of the mid-apicalanteroseptal and apical myocardium.   There is severe hypokinesis of the apicalinferior myocardium. The   study is not technically sufficient to allow evaluation of LV   diastolic function. - Aortic valve: Trileaflet; mildly calcified leaflets. - Mitral valve: Mildly thickened leaflets . There was mild   regurgitation. - Right ventricle: Systolic function was moderately reduced. -  Tricuspid valve: There was mild regurgitation. - Pulmonary arteries: PA peak pressure: 40 mm Hg (S). - Pericardium, extracardiac: There was no pericardial effusion.  Impressions:  - Normal LV wall thickness with mild chamber dilatation and LVEF   approximately 25-30%. There is mid to apical anteroseptal and   apical akinesis with severe hypokinesis of the apical inferior   wall. Indeterminate diastolic function. Mildly thickened mitral   leaflets with mild mitral regurgitation. Mildly sclerotic aortic   valve. Moderately reduced right ventricular contraction. Mild   tricuspid regurgitation with PASP estimated 40 mmHg.  Assessment and Plan:  1. Paroxysmal to persistent atrial fibrillation/flutter, most recently atrial flutter. Heart rate is regular today and she does feel better, plan to continue current heart rate control strategy as well as eloquent for stroke prophylaxis. At this point she is not interested and  EP referral, perhaps rhythm will settle down once her thyroid status improves. We will continue to discuss this.  2. Ischemic cardiomyopathy, most recent LVEF 25-30%. Further reduction in LVEF could be complicated by recurring arrhythmia. She does not report any angina symptoms at this time. No volume overload at present. She has declined ICD.  3. Essential hypertension, blood pressure well controlled today.  4. Hypothyroidism, undergoing adjustments with Synthroid per Dr. Leandrew Koyanagi, most recent TSH 12.  Current medicines were reviewed with the patient today.  Disposition: Follow-up in 3 months.  Signed, Jonelle Sidle, MD, [facility] 01/19/2017 11:56 AM    [facility] [address] [place], [place], [place] [address] Phone: [phone]; Fax: [phone]

## 2017-01-19 ENCOUNTER — Encounter: Payer: Self-pay | Admitting: Cardiology

## 2017-01-19 ENCOUNTER — Ambulatory Visit (INDEPENDENT_AMBULATORY_CARE_PROVIDER_SITE_OTHER): Payer: Medicare Other | Admitting: Cardiology

## 2017-01-19 VITALS — BP 120/64 | HR 58 | Ht 63.0 in | Wt 184.2 lb

## 2017-01-19 DIAGNOSIS — I483 Typical atrial flutter: Secondary | ICD-10-CM

## 2017-01-19 DIAGNOSIS — I255 Ischemic cardiomyopathy: Secondary | ICD-10-CM

## 2017-01-19 DIAGNOSIS — I10 Essential (primary) hypertension: Secondary | ICD-10-CM

## 2017-01-19 DIAGNOSIS — E039 Hypothyroidism, unspecified: Secondary | ICD-10-CM

## 2017-01-19 NOTE — Patient Instructions (Signed)
Medication Instructions:  Your physician recommends that you continue on your current medications as directed. Please refer to the Current Medication list given to you today.  Labwork: NONE  Testing/Procedures: NONE  Follow-Up: Your physician recommends that you schedule a follow-up appointment in: 3 MONTHS WITH DR. MCDOWELL.  Any Other Special Instructions Will Be Listed Below (If Applicable).  If you need a refill on your cardiac medications before your next appointment, please call your pharmacy. 

## 2017-02-01 ENCOUNTER — Ambulatory Visit: Payer: Medicare Other | Admitting: Adult Health

## 2017-03-10 ENCOUNTER — Telehealth: Payer: Self-pay | Admitting: *Deleted

## 2017-03-10 MED ORDER — DIGOXIN 125 MCG PO TABS: 0.1250 mg | ORAL_TABLET | Freq: Every day | ORAL | Status: DC

## 2017-03-10 NOTE — Telephone Encounter (Signed)
For now she can stop the digoxin and then we can see her back in the office to talk about other changes. The losartan is there for her cardiomyopathy and generally is well tolerated if followed closely. If she stops this, she is taking away part of the regimen that is there to try to improve her heart function. The symptoms that she reports could also be due to heart failure or recurring arrhythmia. If we can get her scheduled earlier in the office, we can discuss her symptoms further.

## 2017-03-10 NOTE — Telephone Encounter (Signed)
Patient notified and verbalized understanding.  Appointment moved up to 03/28/2017 with Dr. Diona BrownerMcDowell in the [place] office.

## 2017-03-10 NOTE — Telephone Encounter (Signed)
Patient calling with c/o SOB & troubled breathing.  Stated she notices wheezing & gives out with the least amount of activity.  She thinks that two of her medications may be contributing to her problems.  Stated that she was having these issues in July when she saw you last, but did not mention.  Stated that he husband was in the hospital at that time having open heart surgery.  Stated that she usually does research her medications, but things has just calmed down enough to take care of herself.  Stated that both of these medications have SOB & breathing issues as side effects.  Also, are not supposed to take Losartan if over age 70.  For the Digoxin, stated that research she read said not to take with Coreg or if you have thyroid issues.  Said she has no thyroid.  Stated that her symptoms have gradually gotten worse.  Stated that the medications may not be the problem, but feels like she really wants to be off of these two medications.  Stated that she may be willing to retry them at some point, but one at a time.  Her next scheduled appointment is 04/20/2017 in the [place] office.

## 2017-03-25 NOTE — Progress Notes (Signed)
Cardiology Office Note  Date: 03/28/2017   ID: TRINKA KESHISHYAN, DOB 09/06/1945, MRN [id number]  PCP: Juliette Alcide, MD  Primary Cardiologist: Nona Dell, MD   Chief Complaint  Patient presents with  . Atrial Flutter  . Cardiomyopathy    History of Present Illness: [name] is a 71 y.o. female last seen in July. She presents with a friend today for a follow-up visit. She complains of dyspnea on exertion, also shortness of breath at nighttime, not consistently orthopnea, sometimes more of a wheezing. She does use occasional MDIs. Typically notices that her heart rate is up when she has these symptoms. She does not report chest tightness or pressure.  She states that she stopped Cozaar for about a week because she could not sleep, indicates that she felt better. She has had a number of other drug intolerances and concerns about medications in general.  We have discussed her recurring atrial arrhythmias over time. She has declined EP referral, did not want to take amiodarone for rhythm suppression, and has declined ICD with cardiomyopathy.  She states that she had recent blood work with Dr. Leandrew Koyanagi. She did note some blood after bowel movement on one occasion, this is not been consistent. Her last hemoglobin was 12.1.  Current cardiac regimen includes Eliquis, Coreg, Lasix, potassium supplements, as needed nitroglycerin, and losartan.  Past Medical History:  Diagnosis Date  . CAD (coronary artery disease)    Multivessel status post CABG in 2003  . Chronic combined systolic (congestive) and diastolic (congestive) heart failure (HCC) 12/25/2016  . Chronic systolic heart failure (HCC)   . Essential hypertension   . GERD (gastroesophageal reflux disease)   . Gum disease   . History of atrial flutter    Cardioversion 2016 in [place]  . History of colonic polyps   . History of goiter   . Hyperlipidemia   . Hypothyroidism   . Ischemic cardiomyopathy 12/25/2016  .  Myocardial infarction (HCC)    2003  . Peripheral arterial disease (HCC)    Stent revascularization 2015 - details not clear  . Type 2 diabetes mellitus (HCC)     Past Surgical History:  Procedure Laterality Date  . CHOLECYSTECTOMY     2012  . CORONARY ARTERY BYPASS GRAFT     2003  . PARATHYROIDECTOMY     2015  . THYROIDECTOMY    . TONSILLECTOMY     1967    Current Outpatient Prescriptions  Medication Sig Dispense Refill  . acetaminophen (TYLENOL) 500 MG tablet Take 1,000 mg by mouth every 6 (six) hours as needed for moderate pain.    Marland Kitchen albuterol (PROVENTIL HFA;VENTOLIN HFA) 108 (90 Base) MCG/ACT inhaler Inhale 1 puff into the lungs every 6 (six) hours as needed for wheezing or shortness of breath.    Marland Kitchen apixaban (ELIQUIS) 5 MG TABS tablet Take 1 tablet (5 mg total) by mouth 2 (two) times daily. 60 tablet 8  . carvedilol (COREG) 12.5 MG tablet Take 1.5 tablets (18.75 mg total) by mouth 2 (two) times daily. 270 tablet 3  . Cholecalciferol (VITAMIN D3 PO) Take 1 tablet by mouth daily.    . furosemide (LASIX) 20 MG tablet Take 20-40 mg by mouth daily. Alternating 20 mg and 40 mg every other day.    . levothyroxine (SYNTHROID, LEVOTHROID) 175 MCG tablet Take 175 mcg by mouth daily before breakfast.    . Magnesium 250 MG TABS Take 250 mg by mouth 2 (two) times daily.    Marland Kitchen  nitroGLYCERIN (NITRODUR - DOSED IN MG/24 HR) 0.2 mg/hr patch PLACE 1 PATCH ONTO THE SKIN DAILY AS NEEDED FOR CHEST PAINS 90 patch 3  . omeprazole (PRILOSEC) 40 MG capsule Take 40 mg by mouth daily.     . potassium chloride SA (K-DUR,KLOR-CON) 10 MEQ tablet Take 1 tablet (10 mEq total) by mouth daily. 30 tablet 2   No current facility-administered medications for this visit.    Allergies:  Oxycodone; Pollen extract; and Statins   Social History: The patient  reports that she has quit smoking. Her smoking use included Cigarettes. She has never used smokeless tobacco. She reports that she does not drink alcohol or use  drugs.   ROS:  Please see the history of present illness. Otherwise, complete review of systems is positive for NYHA class 2-3 dyspnea intermittently area no PND. No leg edema..  All other systems are reviewed and negative.   Physical Exam: VS:  BP 124/68 (BP Location: Right Arm)   Pulse 74   Ht  (1.6 m)   Wt 180 lb (81.6 kg)   SpO2 97%   BMI 31.89 kg/m , BMI Body mass index is 31.89 kg/m.  Wt Readings from Last 3 Encounters:  03/28/17 180 lb (81.6 kg)  01/19/17 184 lb 3.2 oz (83.6 kg)  12/28/16 185 lb (83.9 kg)    General: Overweight woman, appears comfortable at rest. HEENT: Conjunctiva and lids normal, oropharynx clear. Neck: Supple, no elevated JVP or carotid bruits, no thyromegaly. Lungs: Clear to auscultation, nonlabored breathing at rest. Cardiac: Irregularly irregular, no S3 or significant systolic murmur, no pericardial rub. Abdomen: Soft, nontender, bowel sounds present, no guarding or rebound. Extremities: No pitting edema, distal pulses 2+. Skin: Warm and dry. Musculoskeletal: No kyphosis. Neuropsychiatric: Alert and oriented x3, affect grossly appropriate.   ECG: I personally reviewed the tracing from 12/24/2016 which showed atrial flutter with variable conduction, decreased R wave progression, nonspecific ST changes.  Recent Labwork: 08/18/2016: Magnesium 0.5 12/24/2016: B Natriuretic Peptide 385.0; TSH 15.569 12/26/2016: BUN 26; Creatinine, Ser 0.98; Hemoglobin 12.1; Platelets 277; Potassium 4.3; Sodium 137   Other Studies Reviewed Today:  Echocardiogram 12/25/2016: Study Conclusions  - Left ventricle: The cavity size was mildly dilated. Wall   thickness was normal. Systolic function was severely reduced. The   estimated ejection fraction was in the range of 25% to 30%. There   is akinesis of the mid-apicalanteroseptal and apical myocardium.   There is severe hypokinesis of the apicalinferior myocardium. The   study is not technically sufficient to allow  evaluation of LV   diastolic function. - Aortic valve: Trileaflet; mildly calcified leaflets. - Mitral valve: Mildly thickened leaflets . There was mild   regurgitation. - Right ventricle: Systolic function was moderately reduced. - Tricuspid valve: There was mild regurgitation. - Pulmonary arteries: PA peak pressure: 40 mm Hg (S). - Pericardium, extracardiac: There was no pericardial effusion.  Impressions:  - Normal LV wall thickness with mild chamber dilatation and LVEF   approximately 25-30%. There is mid to apical anteroseptal and   apical akinesis with severe hypokinesis of the apical inferior   wall. Indeterminate diastolic function. Mildly thickened mitral   leaflets with mild mitral regurgitation. Mildly sclerotic aortic   valve. Moderately reduced right ventricular contraction. Mild   tricuspid regurgitation with PASP estimated 40 mmHg.  Assessment and Plan:  1. Dyspnea on exertion, also some shortness of breath at nighttime with wheezing. Suspect that symptoms are multifactorial. She has a known cardiomyopathy and also  recurring atrial flutter. She has not wanted to pursue further evaluation for treatment options from an EP perspective, has already declined ICD. She states that she feels better off of Cozaar which we will stop for now. At this point plan is to obtain PFTs with 6 minute walk, also a Lexiscan Myoview to assess ischemic burden. We will continue with Coreg, Lasix, and potassium supplements, can consider alternatives to Coreg when she returns.  2. Paroxysmal to persistent atrial flutter, irregular heart beat noted today with controlled rate. She continues on Eliquis for stroke prophylaxis. Requesting recent lab work from Dr. Leandrew Koyanagi.  3. Ischemic cardiomyopathy with LVEF 25-30% as noted above.  4. Essential hypertension, blood pressure well controlled today.  Current medicines were reviewed with the patient today.   Orders Placed This Encounter  Procedures    . NM Myocar Multi W/Spect W/Wall Motion / EF  . Pulmonary function test    Disposition: Follow-up in one month.  Signed, Jonelle Sidle, MD, [facility] 03/28/2017 9:09 AM    [facility] at [facility] [address], [place], [place] [address] Phone: [phone]; Fax: [phone]

## 2017-03-28 ENCOUNTER — Encounter: Payer: Self-pay | Admitting: Cardiology

## 2017-03-28 ENCOUNTER — Ambulatory Visit (INDEPENDENT_AMBULATORY_CARE_PROVIDER_SITE_OTHER): Payer: Medicare Other | Admitting: Cardiology

## 2017-03-28 VITALS — BP 124/68 | HR 74 | Ht 63.0 in | Wt 180.0 lb

## 2017-03-28 DIAGNOSIS — I10 Essential (primary) hypertension: Secondary | ICD-10-CM

## 2017-03-28 DIAGNOSIS — R0602 Shortness of breath: Secondary | ICD-10-CM

## 2017-03-28 DIAGNOSIS — I255 Ischemic cardiomyopathy: Secondary | ICD-10-CM | POA: Diagnosis not present

## 2017-03-28 DIAGNOSIS — I483 Typical atrial flutter: Secondary | ICD-10-CM | POA: Diagnosis not present

## 2017-03-28 NOTE — Patient Instructions (Signed)
Medication Instructions: STOP Cozaar  Labwork: None  Procedures/Testing: Your physician has requested that you have a lexiscan myoview. For further information please visit [email or web address] Please follow instruction sheet, as given.  Your physician has recommended that you have a pulmonary function test. Pulmonary Function Tests are a group of tests that measure how well air moves in and out of your lungs.   Follow-Up: 1 month with Dr Diona Browner  Any Additional Special Instructions Will Be Listed Below (If Applicable).     If you need a refill on your cardiac medications before your next appointment, please call your pharmacy.     Thank you for choosing [facility] !

## 2017-03-29 ENCOUNTER — Other Ambulatory Visit: Payer: Self-pay

## 2017-03-29 DIAGNOSIS — I255 Ischemic cardiomyopathy: Secondary | ICD-10-CM

## 2017-03-30 ENCOUNTER — Ambulatory Visit (HOSPITAL_COMMUNITY)
Admission: RE | Admit: 2017-03-30 | Discharge: 2017-03-30 | Disposition: A | Discharge status: Home / Self Care | Payer: Medicare Other | Source: Ambulatory Visit | Attending: Cardiology | Admitting: Cardiology

## 2017-03-30 DIAGNOSIS — R0602 Shortness of breath: Secondary | ICD-10-CM | POA: Insufficient documentation

## 2017-03-30 LAB — PULMONARY FUNCTION TEST
DL/VA % PRED: 53 %
DL/VA: 2.52 ml/min/mmHg/L
DLCO COR % PRED: 37 %
DLCO COR: 8.54 ml/min/mmHg
DLCO UNC: 8.06 ml/min/mmHg
DLCO unc % pred: 35 %
FEF 25-75 POST: 2.04 L/s
FEF 25-75 Pre: 1.72 L/sec
FEF2575-%Change-Post: 18 %
FEF2575-%PRED-POST: 110 %
FEF2575-%Pred-Pre: 92 %
FEV1-%CHANGE-POST: 1 %
FEV1-%Pred-Post: 74 %
FEV1-%Pred-Pre: 73 %
FEV1-Post: 1.63 L
FEV1-Pre: 1.6 L
FEV1FVC-%CHANGE-POST: 1 %
FEV1FVC-%PRED-PRE: 109 %
FEV6-%Change-Post: 0 %
FEV6-%PRED-PRE: 70 %
FEV6-%Pred-Post: 70 %
FEV6-POST: 1.94 L
FEV6-Pre: 1.93 L
FEV6FVC-%PRED-POST: 105 %
FEV6FVC-%Pred-Pre: 105 %
FVC-%Change-Post: 0 %
FVC-%PRED-POST: 67 %
FVC-%PRED-PRE: 66 %
FVC-POST: 1.94 L
FVC-PRE: 1.93 L
POST FEV6/FVC RATIO: 100 %
PRE FEV6/FVC RATIO: 100 %
Post FEV1/FVC ratio: 84 %
Pre FEV1/FVC ratio: 83 %
RV % PRED: 109 %
RV: 2.33 L
TLC % pred: 91 %
TLC: 4.48 L

## 2017-03-30 LAB — BLOOD GAS, ARTERIAL
Acid-base deficit: 5.1 mmol/L — ABNORMAL HIGH (ref 0.0–2.0)
BICARBONATE: 20.8 mmol/L (ref 20.0–28.0)
DRAWN BY: 27733
FIO2: 0.21
O2 SAT: 95.7 %
PATIENT TEMPERATURE: 98.6
PO2 ART: 84.8 mmHg (ref 83.0–108.0)
pCO2 arterial: 28.5 mmHg — ABNORMAL LOW (ref 32.0–48.0)
pH, Arterial: 7.428 (ref 7.350–7.450)

## 2017-03-30 MED ORDER — ALBUTEROL SULFATE (2.5 MG/3ML) 0.083% IN NEBU
2.5000 mg | INHALATION_SOLUTION | Freq: Once | RESPIRATORY_TRACT | Status: AC
  Administered 2017-03-30: 2.5 mg via RESPIRATORY_TRACT

## 2017-03-31 ENCOUNTER — Other Ambulatory Visit (HOSPITAL_COMMUNITY)
Admission: RE | Admit: 2017-03-31 | Discharge: 2017-03-31 | Disposition: A | Discharge status: Home / Self Care | Payer: Medicare Other | Source: Ambulatory Visit | Attending: Cardiology | Admitting: Cardiology

## 2017-03-31 DIAGNOSIS — I255 Ischemic cardiomyopathy: Secondary | ICD-10-CM | POA: Insufficient documentation

## 2017-03-31 LAB — CBC
HEMATOCRIT: 38.1 % (ref 36.0–46.0)
HEMOGLOBIN: 12.1 g/dL (ref 12.0–15.0)
MCH: 27.9 pg (ref 26.0–34.0)
MCHC: 31.8 g/dL (ref 30.0–36.0)
MCV: 88 fL (ref 78.0–100.0)
Platelets: 199 10*3/uL (ref 150–400)
RBC: 4.33 MIL/uL (ref 3.87–5.11)
RDW: 16.4 % — ABNORMAL HIGH (ref 11.5–15.5)
WBC: 7.6 10*3/uL (ref 4.0–10.5)

## 2017-04-04 ENCOUNTER — Encounter (HOSPITAL_BASED_OUTPATIENT_CLINIC_OR_DEPARTMENT_OTHER)
Admission: RE | Admit: 2017-04-04 | Discharge: 2017-04-04 | Disposition: A | Discharge status: Home / Self Care | Payer: Medicare Other | Source: Ambulatory Visit | Attending: Cardiology | Admitting: Cardiology

## 2017-04-04 ENCOUNTER — Encounter (HOSPITAL_COMMUNITY)
Admission: RE | Admit: 2017-04-04 | Discharge: 2017-04-04 | Disposition: A | Discharge status: Home / Self Care | Payer: Medicare Other | Source: Ambulatory Visit | Attending: Cardiology | Admitting: Cardiology

## 2017-04-04 ENCOUNTER — Encounter (HOSPITAL_COMMUNITY): Payer: Self-pay

## 2017-04-04 DIAGNOSIS — R0602 Shortness of breath: Secondary | ICD-10-CM

## 2017-04-04 DIAGNOSIS — I255 Ischemic cardiomyopathy: Secondary | ICD-10-CM | POA: Diagnosis not present

## 2017-04-04 HISTORY — DX: Disorder of kidney and ureter, unspecified: N28.9

## 2017-04-04 HISTORY — DX: Unspecified asthma, uncomplicated: J45.909

## 2017-04-04 LAB — NM MYOCAR MULTI W/SPECT W/WALL MOTION / EF
CHL CUP NUCLEAR SSS: 19
CSEPPHR: 103 {beats}/min
LV dias vol: 150 mL (ref 46–106)
LVSYSVOL: 119 mL
RATE: 0.45
Rest HR: 77 {beats}/min
SDS: 3
SRS: 16
TID: 1.07

## 2017-04-04 MED ORDER — TECHNETIUM TC 99M TETROFOSMIN IV KIT
10.0000 | PACK | Freq: Once | INTRAVENOUS | Status: AC | PRN
  Administered 2017-04-04: 9.7 via INTRAVENOUS

## 2017-04-04 MED ORDER — SODIUM CHLORIDE 0.9% FLUSH
INTRAVENOUS | Status: AC
  Administered 2017-04-04: 10 mL via INTRAVENOUS
  Filled 2017-04-04: qty 10

## 2017-04-04 MED ORDER — REGADENOSON 0.4 MG/5ML IV SOLN
INTRAVENOUS | Status: AC
  Administered 2017-04-04: 0.4 mg via INTRAVENOUS
  Filled 2017-04-04: qty 5

## 2017-04-04 MED ORDER — TECHNETIUM TC 99M TETROFOSMIN IV KIT
30.0000 | PACK | Freq: Once | INTRAVENOUS | Status: AC | PRN
  Administered 2017-04-04: 31 via INTRAVENOUS

## 2017-04-07 ENCOUNTER — Other Ambulatory Visit: Payer: Self-pay

## 2017-04-07 ENCOUNTER — Inpatient Hospital Stay (HOSPITAL_COMMUNITY)
Admission: EM | Admit: 2017-04-07 | Discharge: 2017-04-13 | DRG: 287 | Disposition: A | Discharge status: Home / Self Care | Payer: Medicare Other | Attending: Cardiovascular Disease | Admitting: Cardiovascular Disease

## 2017-04-07 ENCOUNTER — Encounter (HOSPITAL_COMMUNITY): Payer: Self-pay

## 2017-04-07 ENCOUNTER — Emergency Department (HOSPITAL_COMMUNITY): Payer: Medicare Other

## 2017-04-07 DIAGNOSIS — J310 Chronic rhinitis: Secondary | ICD-10-CM | POA: Diagnosis not present

## 2017-04-07 DIAGNOSIS — I5043 Acute on chronic combined systolic (congestive) and diastolic (congestive) heart failure: Secondary | ICD-10-CM | POA: Diagnosis not present

## 2017-04-07 DIAGNOSIS — I5023 Acute on chronic systolic (congestive) heart failure: Secondary | ICD-10-CM | POA: Diagnosis present

## 2017-04-07 DIAGNOSIS — Z87891 Personal history of nicotine dependence: Secondary | ICD-10-CM

## 2017-04-07 DIAGNOSIS — I272 Pulmonary hypertension, unspecified: Secondary | ICD-10-CM | POA: Diagnosis not present

## 2017-04-07 DIAGNOSIS — I739 Peripheral vascular disease, unspecified: Secondary | ICD-10-CM | POA: Diagnosis not present

## 2017-04-07 DIAGNOSIS — I081 Rheumatic disorders of both mitral and tricuspid valves: Secondary | ICD-10-CM | POA: Diagnosis not present

## 2017-04-07 DIAGNOSIS — E039 Hypothyroidism, unspecified: Secondary | ICD-10-CM | POA: Diagnosis present

## 2017-04-07 DIAGNOSIS — E1169 Type 2 diabetes mellitus with other specified complication: Secondary | ICD-10-CM | POA: Diagnosis present

## 2017-04-07 DIAGNOSIS — Z888 Allergy status to other drugs, medicaments and biological substances status: Secondary | ICD-10-CM

## 2017-04-07 DIAGNOSIS — I5042 Chronic combined systolic (congestive) and diastolic (congestive) heart failure: Secondary | ICD-10-CM

## 2017-04-07 DIAGNOSIS — I110 Hypertensive heart disease with heart failure: Principal | ICD-10-CM | POA: Diagnosis present

## 2017-04-07 DIAGNOSIS — G4733 Obstructive sleep apnea (adult) (pediatric): Secondary | ICD-10-CM | POA: Diagnosis present

## 2017-04-07 DIAGNOSIS — R9439 Abnormal result of other cardiovascular function study: Secondary | ICD-10-CM | POA: Diagnosis not present

## 2017-04-07 DIAGNOSIS — E1151 Type 2 diabetes mellitus with diabetic peripheral angiopathy without gangrene: Secondary | ICD-10-CM | POA: Diagnosis not present

## 2017-04-07 DIAGNOSIS — I252 Old myocardial infarction: Secondary | ICD-10-CM

## 2017-04-07 DIAGNOSIS — Z6832 Body mass index (BMI) 32.0-32.9, adult: Secondary | ICD-10-CM

## 2017-04-07 DIAGNOSIS — Z7901 Long term (current) use of anticoagulants: Secondary | ICD-10-CM

## 2017-04-07 DIAGNOSIS — E669 Obesity, unspecified: Secondary | ICD-10-CM | POA: Diagnosis not present

## 2017-04-07 DIAGNOSIS — E785 Hyperlipidemia, unspecified: Secondary | ICD-10-CM | POA: Diagnosis present

## 2017-04-07 DIAGNOSIS — I25708 Atherosclerosis of coronary artery bypass graft(s), unspecified, with other forms of angina pectoris: Secondary | ICD-10-CM | POA: Diagnosis not present

## 2017-04-07 DIAGNOSIS — E876 Hypokalemia: Secondary | ICD-10-CM | POA: Diagnosis present

## 2017-04-07 DIAGNOSIS — I200 Unstable angina: Secondary | ICD-10-CM | POA: Diagnosis not present

## 2017-04-07 DIAGNOSIS — I480 Paroxysmal atrial fibrillation: Secondary | ICD-10-CM | POA: Diagnosis not present

## 2017-04-07 DIAGNOSIS — I4892 Unspecified atrial flutter: Secondary | ICD-10-CM | POA: Diagnosis not present

## 2017-04-07 DIAGNOSIS — I493 Ventricular premature depolarization: Secondary | ICD-10-CM | POA: Diagnosis not present

## 2017-04-07 DIAGNOSIS — N289 Disorder of kidney and ureter, unspecified: Secondary | ICD-10-CM | POA: Diagnosis present

## 2017-04-07 DIAGNOSIS — K219 Gastro-esophageal reflux disease without esophagitis: Secondary | ICD-10-CM | POA: Diagnosis not present

## 2017-04-07 DIAGNOSIS — Z951 Presence of aortocoronary bypass graft: Secondary | ICD-10-CM

## 2017-04-07 DIAGNOSIS — J449 Chronic obstructive pulmonary disease, unspecified: Secondary | ICD-10-CM | POA: Diagnosis not present

## 2017-04-07 DIAGNOSIS — K5900 Constipation, unspecified: Secondary | ICD-10-CM | POA: Diagnosis not present

## 2017-04-07 DIAGNOSIS — R072 Precordial pain: Secondary | ICD-10-CM

## 2017-04-07 DIAGNOSIS — I255 Ischemic cardiomyopathy: Secondary | ICD-10-CM | POA: Diagnosis not present

## 2017-04-07 DIAGNOSIS — E890 Postprocedural hypothyroidism: Secondary | ICD-10-CM | POA: Diagnosis present

## 2017-04-07 DIAGNOSIS — I481 Persistent atrial fibrillation: Secondary | ICD-10-CM | POA: Diagnosis not present

## 2017-04-07 DIAGNOSIS — Z91048 Other nonmedicinal substance allergy status: Secondary | ICD-10-CM

## 2017-04-07 DIAGNOSIS — Z79899 Other long term (current) drug therapy: Secondary | ICD-10-CM

## 2017-04-07 DIAGNOSIS — Z885 Allergy status to narcotic agent status: Secondary | ICD-10-CM

## 2017-04-07 DIAGNOSIS — R079 Chest pain, unspecified: Secondary | ICD-10-CM | POA: Diagnosis present

## 2017-04-07 LAB — BASIC METABOLIC PANEL
Anion gap: 12 (ref 5–15)
BUN: 21 mg/dL — AB (ref 6–20)
CALCIUM: 8 mg/dL — AB (ref 8.9–10.3)
CO2: 20 mmol/L — ABNORMAL LOW (ref 22–32)
CREATININE: 1.06 mg/dL — AB (ref 0.44–1.00)
Chloride: 105 mmol/L (ref 101–111)
GFR calc Af Amer: >60 mL/min (ref >60)
GFR, EST NON AFRICAN AMERICAN: 52 mL/min — AB (ref >60)
GLUCOSE: 157 mg/dL — AB (ref 65–99)
POTASSIUM: 3.3 mmol/L — AB (ref 3.5–5.1)
Sodium: 137 mmol/L (ref 135–145)

## 2017-04-07 LAB — PROTIME-INR
INR: 1.54
Prothrombin Time: 18.4 seconds — ABNORMAL HIGH (ref 11.4–15.2)

## 2017-04-07 LAB — CBC
HEMATOCRIT: 34.6 % — AB (ref 36.0–46.0)
Hemoglobin: 11.2 g/dL — ABNORMAL LOW (ref 12.0–15.0)
MCH: 28.1 pg (ref 26.0–34.0)
MCHC: 32.4 g/dL (ref 30.0–36.0)
MCV: 86.7 fL (ref 78.0–100.0)
PLATELETS: 210 10*3/uL (ref 150–400)
RBC: 3.99 MIL/uL (ref 3.87–5.11)
RDW: 16 % — AB (ref 11.5–15.5)
WBC: 8.9 10*3/uL (ref 4.0–10.5)

## 2017-04-07 LAB — I-STAT TROPONIN, ED: Troponin i, poc: 0 ng/mL (ref 0.00–0.08)

## 2017-04-07 MED ORDER — SODIUM CHLORIDE 0.9 % IV SOLN: 250.0000 mL | INTRAVENOUS | Status: DC | PRN

## 2017-04-07 MED ORDER — SODIUM CHLORIDE 0.9% FLUSH: 3.0000 mL | INTRAVENOUS | Status: DC | PRN

## 2017-04-07 MED ORDER — CARVEDILOL 12.5 MG PO TABS
12.5000 mg | ORAL_TABLET | Freq: Two times a day (BID) | ORAL | Status: DC
  Administered 2017-04-07: 12.5 mg via ORAL
  Filled 2017-04-07 (×2): qty 1

## 2017-04-07 MED ORDER — DIPHENHYDRAMINE HCL 50 MG/ML IJ SOLN
12.5000 mg | Freq: Once | INTRAMUSCULAR | Status: AC
  Administered 2017-04-07: 12.5 mg via INTRAVENOUS
  Filled 2017-04-07: qty 1

## 2017-04-07 MED ORDER — MAGNESIUM OXIDE 400 (241.3 MG) MG PO TABS
200.0000 mg | ORAL_TABLET | Freq: Every day | ORAL | Status: DC
  Administered 2017-04-07 – 2017-04-09 (×3): 200 mg via ORAL
  Filled 2017-04-07 (×3): qty 1

## 2017-04-07 MED ORDER — POTASSIUM CHLORIDE CRYS ER 20 MEQ PO TBCR
40.0000 meq | EXTENDED_RELEASE_TABLET | Freq: Once | ORAL | Status: AC
  Administered 2017-04-07: 40 meq via ORAL
  Filled 2017-04-07: qty 2

## 2017-04-07 MED ORDER — LEVOTHYROXINE SODIUM 75 MCG PO TABS
175.0000 ug | ORAL_TABLET | Freq: Every day | ORAL | Status: DC
  Administered 2017-04-07 – 2017-04-13 (×5): 175 ug via ORAL
  Filled 2017-04-07: qty 4
  Filled 2017-04-07 (×5): qty 1

## 2017-04-07 MED ORDER — SODIUM CHLORIDE 0.9 % IV SOLN
INTRAVENOUS | Status: DC
  Administered 2017-04-07 – 2017-04-08 (×2): via INTRAVENOUS

## 2017-04-07 MED ORDER — NITROGLYCERIN IN D5W 200-5 MCG/ML-% IV SOLN: 5.0000 ug/min | Freq: Once | INTRAVENOUS | Status: AC

## 2017-04-07 MED ORDER — MORPHINE SULFATE (PF) 4 MG/ML IV SOLN
4.0000 mg | Freq: Once | INTRAVENOUS | Status: AC
  Administered 2017-04-07: 4 mg via INTRAVENOUS
  Filled 2017-04-07: qty 1

## 2017-04-07 MED ORDER — FUROSEMIDE 20 MG PO TABS
20.0000 mg | ORAL_TABLET | Freq: Every day | ORAL | Status: DC
  Administered 2017-04-07: 20 mg via ORAL
  Filled 2017-04-07: qty 1

## 2017-04-07 MED ORDER — ASPIRIN 81 MG PO CHEW
324.0000 mg | CHEWABLE_TABLET | Freq: Once | ORAL | Status: DC
  Filled 2017-04-07: qty 4

## 2017-04-07 MED ORDER — NITROGLYCERIN IN D5W 200-5 MCG/ML-% IV SOLN
5.0000 ug/min | Freq: Once | INTRAVENOUS | Status: AC
  Administered 2017-04-07: 5 ug/min via INTRAVENOUS
  Filled 2017-04-07: qty 250

## 2017-04-07 MED ORDER — SODIUM CHLORIDE 0.9% FLUSH: 3.0000 mL | Freq: Two times a day (BID) | INTRAVENOUS | Status: DC

## 2017-04-07 MED ORDER — ASPIRIN 81 MG PO CHEW
81.0000 mg | CHEWABLE_TABLET | ORAL | Status: AC
  Administered 2017-04-08: 81 mg via ORAL
  Filled 2017-04-07: qty 1

## 2017-04-07 NOTE — ED Notes (Signed)
Patient is resting comfortably. 

## 2017-04-07 NOTE — ED Provider Notes (Signed)
[facility]-EMERGENCY DEPT Provider Note   CSN: [id number] Arrival date & time: 04/07/17  0020     History   Chief Complaint Chief Complaint  Patient presents with  . Chest Pain    HPI [name] is a 71 y.o. female.  HPI  This is a 71 year old female with a history of coronary artery disease, heart failure, diabetes, renal insufficiency who presents with chest and back pain. Patient reports onset of symptoms at 11 PM. It is constant. She reports having stabbing pain in the right scapula. Currently 7 out of 10. Previously had similar pain to this when she had a heart attack. She reports that nitroglycerin helps her pain some. She denies any diaphoresis. She does report persistent worsening shortness breath over the last several months. No recent fevers or cough. She reports having a stress test on Monday but is unsure of what the results were.  Of note, have reviewed the patient's chart. I reviewed her stress test. It was a high risk test. Patient's primary cardiologist is Dr. Diona Browner.  Past Medical History:  Diagnosis Date  . Asthma   . CAD (coronary artery disease)    Multivessel status post CABG in 2003  . Chronic combined systolic (congestive) and diastolic (congestive) heart failure (HCC) 12/25/2016  . Chronic systolic heart failure (HCC)   . Essential hypertension   . GERD (gastroesophageal reflux disease)   . Gum disease   . History of atrial flutter    Cardioversion 2016 in [place]  . History of colonic polyps   . History of goiter   . Hyperlipidemia   . Hypothyroidism   . Ischemic cardiomyopathy 12/25/2016  . Myocardial infarction (HCC)    2003  . Peripheral arterial disease (HCC)    Stent revascularization 2015 - details not clear  . Renal insufficiency   . Type 2 diabetes mellitus [facility])     Patient Active Problem List   Diagnosis Date Noted  . Elevated troponin 12/25/2016  . Ischemic cardiomyopathy 12/25/2016  . Chronic combined systolic (congestive) and  diastolic (congestive) heart failure (HCC) 12/25/2016  . Atrial flutter by electrocardiogram (HCC)   . Acute on chronic systolic heart failure (HCC) 12/24/2016  . Paroxysmal atrial flutter (HCC) 12/24/2016  . Coronary artery disease 12/24/2016  . Hypothyroidism 12/24/2016  . GERD (gastroesophageal reflux disease) 12/24/2016  . Type 2 diabetes mellitus with other specified complication (HCC) 12/24/2016    Past Surgical History:  Procedure Laterality Date  . CHOLECYSTECTOMY     2012  . CORONARY ARTERY BYPASS GRAFT     2003  . PARATHYROIDECTOMY     2015  . THYROIDECTOMY    . TONSILLECTOMY     1967    OB History    No data available       Home Medications    Prior to Admission medications   Medication Sig Start Date End Date Taking? Authorizing Provider  acetaminophen (TYLENOL) 500 MG tablet Take 1,000 mg by mouth every 6 (six) hours as needed for moderate pain.    [provider]  albuterol (PROVENTIL HFA;VENTOLIN HFA) 108 (90 Base) MCG/ACT inhaler Inhale 1 puff into the lungs every 6 (six) hours as needed for wheezing or shortness of breath.    [provider]  apixaban (ELIQUIS) 5 MG TABS tablet Take 1 tablet (5 mg total) by mouth 2 (two) times daily. 10/08/16 06/25/17  Jonelle Sidle, MD  carvedilol (COREG) 12.5 MG tablet Take 1.5 tablets (18.75 mg total) by mouth 2 (two)  times daily. 12/28/16 03/28/17  Jodelle Gross, NP  Cholecalciferol (VITAMIN D3 PO) Take 1 tablet by mouth daily.    [provider]  furosemide (LASIX) 20 MG tablet Take 20-40 mg by mouth daily. Alternating 20 mg and 40 mg every other day. 01/22/16 03/28/17  [provider]  levothyroxine (SYNTHROID, LEVOTHROID) 175 MCG tablet Take 175 mcg by mouth daily before breakfast.    [provider]  Magnesium 250 MG TABS Take 250 mg by mouth 2 (two) times daily.    [provider]  nitroGLYCERIN (NITRODUR - DOSED IN MG/24 HR) 0.2 mg/hr patch PLACE 1 PATCH  ONTO THE SKIN DAILY AS NEEDED FOR CHEST PAINS 12/23/16   Jonelle Sidle, MD  omeprazole (PRILOSEC) 40 MG capsule Take 40 mg by mouth daily.  01/22/16 03/28/17  [provider]  potassium chloride SA (K-DUR,KLOR-CON) 10 MEQ tablet Take 1 tablet (10 mEq total) by mouth daily. 12/26/16   Elliot Cousin, MD    Family History Family History  Problem Relation Age of Onset  . Stroke Mother   . Heart attack Father   . Breast cancer Maternal Aunt   . Breast cancer Paternal Aunt     Social History Social History  Substance Use Topics  . Smoking status: Former Smoker    Types: Cigarettes  . Smokeless tobacco: Never Used  . Alcohol use No     Allergies   Oxycodone; Pollen extract; and Statins   Review of Systems Review of Systems  Constitutional: Negative for fever.  Respiratory: Positive for chest tightness and shortness of breath. Negative for cough.   Cardiovascular: Positive for chest pain.  Gastrointestinal: Negative for abdominal pain, nausea and vomiting.  Genitourinary: Negative for dysuria.  All other systems reviewed and are negative.    Physical Exam Updated Vital Signs BP (!) 108/58   Pulse 78   Temp 98.3 F (36.8 C) (Oral)   Resp 18   Ht  (1.6 m)   Wt 81.6 kg (180 lb)   SpO2 97%   BMI 31.89 kg/m   Physical Exam  Constitutional: She is oriented to person, place, and time. She appears well-developed and well-nourished.  HENT:  Head: Normocephalic and atraumatic.  Cardiovascular: Normal rate, regular rhythm and normal heart sounds.   Pulmonary/Chest: Effort normal and breath sounds normal. No respiratory distress. She has no wheezes.  Well-healing midline sternotomy scar  Abdominal: Soft. There is no tenderness.  Musculoskeletal: She exhibits edema.  Trace lower extremity edema  Neurological: She is alert and oriented to person, place, and time.  Skin: Skin is warm and dry.  Psychiatric: She has a normal mood and affect.  Nursing note and  vitals reviewed.    ED Treatments / Results  Labs (all labs ordered are listed, but only abnormal results are displayed) Labs Reviewed  BASIC METABOLIC PANEL - Abnormal; Notable for the following:       Result Value   Potassium 3.3 (*)    CO2 20 (*)    Glucose, Bld 157 (*)    BUN 21 (*)    Creatinine, Ser 1.06 (*)    Calcium 8.0 (*)    GFR calc non Af Amer 52 (*)    All other components within normal limits  CBC - Abnormal; Notable for the following:    Hemoglobin 11.2 (*)    HCT 34.6 (*)    RDW 16.0 (*)    All other components within normal limits  I-STAT TROPONIN, ED  EKG  EKG Interpretation  Date/Time:  Thursday April 07 2017 00:29:46 EDT Ventricular Rate:  87 PR Interval:    QRS Duration: 88 QT Interval:  454 QTC Calculation: 547 R Axis:   124 Text Interpretation:  Atrial fibrillation Probable lateral infarct, age indeterminate Prolonged QT interval No significant change since last tracing Confirmed by Ross Marcus ([id number]) on 04/07/2017 12:40:07 AM       Radiology Dg Chest Portable 1 View  Result Date: 04/07/2017 CLINICAL DATA:  Acute onset of shortness of breath and cough. Left lower posterior chest pain. Initial encounter. EXAM: PORTABLE CHEST 1 VIEW COMPARISON:  Chest radiograph performed 12/24/2016 FINDINGS: The lungs are well-aerated. Mild vascular congestion is noted. There is no evidence of focal opacification, pleural effusion or pneumothorax. The cardiomediastinal silhouette is mildly enlarged. The patient is status post median sternotomy. No acute osseous abnormalities are seen. IMPRESSION: Mild vascular congestion and mild cardiomegaly. Lungs remain grossly clear. Electronically Signed   By: Roanna Raider M.D.   On: 04/07/2017 01:14    Procedures Procedures (including critical care time)  CRITICAL CARE Performed by: Shon Baton   Total critical care time: 30 minutes  Critical care time was exclusive of separately billable  procedures and treating other patients.  Critical care was necessary to treat or prevent imminent or life-threatening deterioration.  Critical care was time spent personally by me on the following activities: development of treatment plan with patient and/or surrogate as well as nursing, discussions with consultants, evaluation of patient's response to treatment, examination of patient, obtaining history from patient or surrogate, ordering and performing treatments and interventions, ordering and review of laboratory studies, ordering and review of radiographic studies, pulse oximetry and re-evaluation of patient's condition.   Medications Ordered in ED Medications  aspirin chewable tablet 324 mg (0 mg Oral Hold 04/07/17 0122)  nitroGLYCERIN 50 mg in dextrose 5 % 250 mL (0.2 mg/mL) infusion (5 mcg/min Intravenous New Bag/Given 04/07/17 0123)  morphine 4 MG/ML injection 4 mg (4 mg Intravenous Given 04/07/17 0154)  diphenhydrAMINE (BENADRYL) injection 12.5 mg (12.5 mg Intravenous Given 04/07/17 0204)     Initial Impression / Assessment and Plan / ED Course  I have reviewed the triage vital signs and the nursing notes.  Pertinent labs & imaging results that were available during my care of the patient were reviewed by me and considered in my medical decision making (see chart for details).     Patient presents with chest pain and scapular pain. Reports this is similar to prior MI. Does report recent history of shortness of breath. Had a stress test on Monday is categorized as high risk. Her EKG shows atrial fibrillation that is rate controlled. Initial troponin is negative. Patient was given full dose aspirin. She started on a nitroglycerin drip. After 15 minutes on the nitroglycerin drip she had minimal improvement of her pain. Morphine was added. I discussed the patient with on-call cardiology Dr. Allena Katz.  Given recent high-risk stress test, feel patient's next step is likely a cardiac  catheterization. He has reviewed the stress test results. Recommended hospitalist admission with likely transfer to [facility] for further evaluation. There are no stepdown beds at [facility] or [facility].  Plan will be to place patient in queue for a stepdown bed at [facility]. If patient is still in the ED in the morning, she can be evaluated by cardiology at [facility].  This was discussed with Dr. Robb Matar. Will place bed request.  Final Clinical Impressions(s) /  ED Diagnoses   Final diagnoses:  Precordial pain    New Prescriptions New Prescriptions   No medications on file     Shon Baton, MD 04/07/17 [id number]

## 2017-04-07 NOTE — Progress Notes (Signed)
Pt arrived from [facility]. VSS. Telemetry applied, CCMD notified. CHG bath completed. Lunch ordered per pt request. Spoke with Geoffry Paradise cardiology PA - updated order for nitro drip.   Leonidas Romberg, RN

## 2017-04-07 NOTE — Consult Note (Addendum)
 Cardiology Consultation:   Patient ID: [name]; [id number]; 08/24/1945   Admit date: 04/07/2017 Date of Consult: 04/07/2017  Primary Care Provider: Burdine, Steven E, MD Primary Cardiologist: McDowell   Patient Profile:   [name] is a 70 y.o. female with a hx of Chronic dyspnea on exertion, CAD, multivessel status post coronary artery bypass grafting in 2003, chronic combined systolic and diastolic heart failure, hypertension, history of atrial flutter with cardioversion in 2016, hyperlipidemia, hypothyroidism, and ischemic cardiomyopathy who is being seen today for the evaluation of recurrent chest pain, recent abnormal Lexiscan Myoview at the request of Dr. Lama,Hospitalist service  History of Present Illness:   [name] presented to the emergency room with complaints of chest pain and back pain described as stabbing pain in the left scapula, reminiscent of pain she experienced prior to MI in the past. No associated diaphoresis. States her breathing status has been tenuous for months, but worsening over the last few few weeks. Has had episodes of dizziness, and heart racing.  The patient was recently seen by Dr. McDowell on 03/28/2017 with symptoms of dyspnea and discomfort, was ordered a Lexiscan Myoview. The test was found to be high risk:   Study Result     Blood pressure demonstrated a normal response to exercise.  There was no ST segment deviation noted during stress.  Findings consistent with prior myocardial infarction with peri-infarct ischemia.  This is a high risk study.  The left ventricular ejection fraction is severely decreased (<30%).   Large anterior , apical and lateral wall infarct with mild peri infarct ischemia involving the anterolateral wall at mid and  Basal level Diffuse hypokinesis with anterior and apical akinesis EF 21% but patient in afib with PVC;s   On arrival to the emergency room the patient's blood pressure was 124/68, heart  rate 74, O2 sat 97%, she was afebrile. EKG revealed atrial fibrillation with T-wave flattening in the lateral, and inferior leads, heart rate of 87 bpm. Essentially unchanged from prior EKG in July 2018.  Pertinent labs, sodium 137, potassium 3.3, chloride 105, CO2 20, glucose 127, creatinine 1.06. Hemoglobin 11.2 hematocrit 34.6, white blood cells 8.9, platelets 210. Troponin negative 1. Chest x-ray revealed mild vascular congestion and mild cardiomegaly, no evidence of CHF or pneumonia. She was treated with aspirin, nitroglycerin, and Benadryl  ER physician, Dr. Horton, spoke with cardiology fellow on-call overnight, who recommended transfer to [facility] for cardiac catheterization in light of symptoms and abnormal stress test. Apparently no beds are available or stepdown. She is wiling to be transferred to [facility] for cath if necessary.   Past Medical History:  Diagnosis Date  . Asthma   . CAD (coronary artery disease)    Multivessel status post CABG in 2003  . Chronic combined systolic (congestive) and diastolic (congestive) heart failure (HCC) 12/25/2016  . Chronic systolic heart failure (HCC)   . Essential hypertension   . GERD (gastroesophageal reflux disease)   . Gum disease   . History of atrial flutter    Cardioversion 2016 in [place]  . History of colonic polyps   . History of goiter   . Hyperlipidemia   . Hypothyroidism   . Ischemic cardiomyopathy 12/25/2016  . Myocardial infarction (HCC)    2003  . Peripheral arterial disease (HCC)    Stent revascularization 2015 - details not clear  . Renal insufficiency   . Type 2 diabetes mellitus (HCC)     Past Surgical History:  Procedure Laterality Date  .   CHOLECYSTECTOMY     2012  . CORONARY ARTERY BYPASS GRAFT     2003  . PARATHYROIDECTOMY     2015  . THYROIDECTOMY    . TONSILLECTOMY     1967     Home Medications:  Prior to Admission medications   Medication Sig Start Date End Date Taking? Authorizing Provider  acetaminophen  (TYLENOL) 500 MG tablet Take 1,000 mg by mouth every 6 (six) hours as needed for moderate pain.   Yes [provider]  albuterol (PROVENTIL HFA;VENTOLIN HFA) 108 (90 Base) MCG/ACT inhaler Inhale 1 puff into the lungs every 6 (six) hours as needed for wheezing or shortness of breath.   Yes [provider]  apixaban (ELIQUIS) 5 MG TABS tablet Take 1 tablet (5 mg total) by mouth 2 (two) times daily. 10/08/16 06/25/17 Yes McDowell, Samuel G, MD  carvedilol (COREG) 12.5 MG tablet Take 1.5 tablets (18.75 mg total) by mouth 2 (two) times daily. 12/28/16 04/07/17 Yes Lawrence, Kathryn M, NP  Cholecalciferol (VITAMIN D3 PO) Take 1 tablet by mouth daily.   Yes [provider]  furosemide (LASIX) 20 MG tablet Take 20-40 mg by mouth daily. Alternating 20 mg and 40 mg every other day. 01/22/16 04/07/17 Yes [provider]  levothyroxine (SYNTHROID, LEVOTHROID) 175 MCG tablet Take 175 mcg by mouth daily before breakfast.   Yes [provider]  Magnesium 250 MG TABS Take 250 mg by mouth daily.    Yes [provider]  nitroGLYCERIN (NITRODUR - DOSED IN MG/24 HR) 0.2 mg/hr patch PLACE 1 PATCH ONTO THE SKIN DAILY AS NEEDED FOR CHEST PAINS 12/23/16  Yes McDowell, Samuel G, MD  omeprazole (PRILOSEC) 40 MG capsule Take 40 mg by mouth daily.  01/22/16 04/07/17 Yes [provider]  potassium chloride SA (K-DUR,KLOR-CON) 10 MEQ tablet Take 1 tablet (10 mEq total) by mouth daily. 12/26/16  Yes Fisher, Denise, MD    Inpatient Medications: Scheduled Meds: . aspirin  324 mg Oral Once   Continuous Infusions:  PRN Meds:   Allergies:    Allergies  Allergen Reactions  . Oxycodone Other (See Comments)    hallucinations   . Pollen Extract Other (See Comments)    Sneezing, running nose.  . Statins Other (See Comments)    Cramping     Social History:   Social History   Social History  . Marital status: Married    Spouse name: N/A  . Number of children: N/A    . Years of education: N/A   Occupational History  . Not on file.   Social History Main Topics  . Smoking status: Former Smoker    Types: Cigarettes  . Smokeless tobacco: Never Used  . Alcohol use No  . Drug use: No  . Sexual activity: Not on file   Other Topics Concern  . Not on file   Social History Narrative  . No narrative on file    Family History:    Family History  Problem Relation Age of Onset  . Stroke Mother   . Heart attack Father   . Breast cancer Maternal Aunt   . Breast cancer Paternal Aunt      ROS:  Please see the history of present illness.  ROS  All other ROS reviewed and negative.     Physical Exam/Data:   Vitals:   04/07/17 0530 04/07/17 0638 04/07/17 0700 04/07/17 0800  BP: 106/68 130/87 105/76 136/67  Pulse:  87 81 96  Resp: 16 18 17   16  Temp:      TempSrc:      SpO2:  97% 100% 100%  Weight:      Height:       No intake or output data in the 24 hours ending 04/07/17 0934 Filed Weights   04/07/17 0028  Weight: 180 lb (81.6 kg)   Body mass index is 31.89 kg/m.  General:  Well nourished, well developed, in no acute distress HEENT: normal Lymph: no adenopathy Neck: no JVD Endocrine:  No thryomegaly Vascular: No carotid bruits; FA pulses 2+ bilaterally without bruits  Cardiac:  normal S1, S2; RRR; no murmur  Lungs:Some inspiratory wheezes, no coughing. Wearing O2  Abd: soft, nontender, no hepatomegaly  Ext: no edema Musculoskeletal:  No deformities, BUE and BLE strength normal and equal Skin: warm and dry  Neuro:  CNs 2-12 intact, no focal abnormalities noted Psych:  Normal affect   EKG:  The EKG was personally reviewed and demonstrates:  Atrial fib with inferior/lateral T-wave flattening.  Telemetry:  Telemetry was personally reviewed and demonstrates:  Atrial fib   Relevant CV Studies: Stress test as above  Echocardiogram 12/05/2016 Left ventricle: The cavity size was mildly dilated. Wall   thickness was normal. Systolic  function was severely reduced. The   estimated ejection fraction was in the range of 25% to 30%. There   is akinesis of the mid-apicalanteroseptal and apical myocardium.   There is severe hypokinesis of the apicalinferior myocardium. The   study is not technically sufficient to allow evaluation of LV   diastolic function. - Aortic valve: Trileaflet; mildly calcified leaflets. - Mitral valve: Mildly thickened leaflets . There was mild   regurgitation. - Right ventricle: Systolic function was moderately reduced. - Tricuspid valve: There was mild regurgitation. - Pulmonary arteries: PA peak pressure: 40 mm Hg (S). - Pericardium, extracardiac: There was no pericardial effusion.  Holter Monitor 01/04/2017 Notes recorded by Lawrence, Kathryn M, NP on 01/11/2017 at 2:53 PM EDT Still in atrial fib, but HR average is 72 bpm. Some elevations. She is having lots of PVC's Increase carvedilol to 18.75 mg BID,   Laboratory Data:  Chemistry Recent Labs Lab 04/07/17 0052  NA 137  K 3.3*  CL 105  CO2 20*  GLUCOSE 157*  BUN 21*  CREATININE 1.06*  CALCIUM 8.0*  GFRNONAA 52*  GFRAA >60  ANIONGAP 12    Hematology Recent Labs Lab 03/31/17 1016 04/07/17 0052  WBC 7.6 8.9  RBC 4.33 3.99  HGB 12.1 11.2*  HCT 38.1 34.6*  MCV 88.0 86.7  MCH 27.9 28.1  MCHC 31.8 32.4  RDW 16.4* 16.0*  PLT 199 210   Cardiac EnzymesNo results for input(s): TROPONINI in the last 168 hours.  Recent Labs Lab 04/07/17 0056  TROPIPOC 0.00     Radiology/Studies:  Nm Myocar Multi W/spect W/wall Motion / Ef  Result Date: 04/04/2017  Blood pressure demonstrated a normal response to exercise.  There was no ST segment deviation noted during stress.  Findings consistent with prior myocardial infarction with peri-infarct ischemia.  This is a high risk study.  The left ventricular ejection fraction is severely decreased (<30%).  Large anterior , apical and lateral wall infarct with mild peri infarct ischemia  involving the anterolateral wall at mid and Basal level Diffuse hypokinesis with anterior and apical akinesis EF 21% but patient in afib with PVC;s   Dg Chest Portable 1 View  Result Date: 04/07/2017 CLINICAL DATA:  Acute onset of shortness of breath and cough.   Left lower posterior chest pain. Initial encounter. EXAM: PORTABLE CHEST 1 VIEW COMPARISON:  Chest radiograph performed 12/24/2016 FINDINGS: The lungs are well-aerated. Mild vascular congestion is noted. There is no evidence of focal opacification, pleural effusion or pneumothorax. The cardiomediastinal silhouette is mildly enlarged. The patient is status post median sternotomy. No acute osseous abnormalities are seen. IMPRESSION: Mild vascular congestion and mild cardiomegaly. Lungs remain grossly clear. Electronically Signed   By: Jeffery  Chang M.D.   On: 04/07/2017 01:14    Assessment and Plan:   1. Chest Pain: Typical and atypical. but reminiscent of prior MI symptoms for patient. She is currently a nitroglycerin drip and oxygen and pain-free. Initial troponin negative. EKG is unchanged from prior EKG July 2018. She did have abnormal Lexiscan Myoview on 03/28/2017 which was described as high-risk likely due to reduced EF, large defect from prior MI, and mild peri-infarct ischemia.   Uncertain that she needs to be transferred for cardiac cath at this time as there is no evidence for ACS, however with recurrent symptoms she may need to be evaluated with angiography of her grafts. She remains on ELIQUIS which can be held for 24 hours prior to cath. We'll discuss with Dr. Destyn Schuyler timing and need for transfer for catheterization. The patient is willing to proceed.  The patient understands that risks include but are not limited to stroke (1 in 1000), death (1 in 1000), kidney failure [usually temporary] (1 in 500), bleeding (1 in 200), allergic reaction [possibly serious] (1 in 200), and agrees to proceed.  She is currently intolerant to statins  causing severe myalgia pain.  2. Ischemic cardiomyopathy: Most recent echocardiogram revealing an EF of 25%-30% She did have a discussion with Dr. McDowell concerning ICD implantation. She is willing to proceed with this only if this is necessary and will be beneficial to her. She is having some dizziness and rapid heart rhythm. Holter monitor did not reveal ventricular tachycardia one placed in July 2018. For now would continue carvedilol 12.75 mg twice a day (lower dose as she is on NTG gtt), She remains on Lasix. Will restart her medications in ER while awaiting need for transfer or admission to [facility].   3. Atrial fibrillation: Heart rate is currently well-controlled. She remains on ELIQUIS. Will need to hold this prior to cath.CHADS VASC Score of 6.   4. Hypertension: Blood pressure is not optimal for current EF. Would like to begin lisinopril 2.5 mg daily. She  did not tolerate ARB (Cozaar), due to worsening breathing status. She stopped taking it on her own.  5. Chronic dyspnea: She was scheduled for PFTs on last office visit. This can be pursued after discharge.  6. Hypokalemia: Potassium 3.3 on admission. Will replete this. Should keep at 4.0 for cardiac patients.   7.Hypothyroidism: Restart Levothyroxine at home dose.    For questions or updates, please contact [facility] Please consult [email or web address] for contact info under Cardiology/STEMI.   Signed, Kathryn Lawrence DNP, ANP, AACC  04/07/2017 9:34 AM   Patient examined chart reviewed. Discussed care with NP and patient SSCP with LBBB and negative troponin Old CABG She is class 3 CHF With fairly marked dyspnea on exertion. She is pain free now on nitro  Discussed options and favor transfer to [facility] for right and left cath Friday Holding eliquis for a day should be fine. Can assess further Rx afib after cath And consider [facility]. Patient willing to go to [facility] Continue coreg for rate control and CAD start entresto   post cath for CHF  depending on filling pressures  Exam with  Overweight white female basilar crackles no murmur trace LE edema   Tangee Marszalek  

## 2017-04-07 NOTE — ED Notes (Signed)
ED Provider at bedside. 

## 2017-04-07 NOTE — ED Notes (Signed)
Pt c/o itching while morphine was being given, Dr Wilkie Aye notified, additional orders given

## 2017-04-07 NOTE — ED Notes (Signed)
Patient assisted to bedside reclining chair. Patient wanted to get out of the bed. Patient states she does not need anything else at this time.

## 2017-04-07 NOTE — ED Triage Notes (Signed)
Pt reports stabbing pain under left shoulder blade, states the last time she had a heart attack the pain was in the same place.   Pt states she took 2 nitro at home with some improvement.  Pt states she also took 2 regular strength aspirin.

## 2017-04-07 NOTE — ED Notes (Signed)
Pt reports that she is pain free at present,

## 2017-04-08 ENCOUNTER — Encounter (HOSPITAL_COMMUNITY): Payer: Self-pay | Admitting: Interventional Cardiology

## 2017-04-08 ENCOUNTER — Encounter (HOSPITAL_COMMUNITY)
Admission: EM | Disposition: A | Discharge status: Home / Self Care | Payer: Self-pay | Source: Home / Self Care | Attending: Cardiovascular Disease

## 2017-04-08 DIAGNOSIS — I2510 Atherosclerotic heart disease of native coronary artery without angina pectoris: Secondary | ICD-10-CM | POA: Diagnosis not present

## 2017-04-08 DIAGNOSIS — R9439 Abnormal result of other cardiovascular function study: Secondary | ICD-10-CM | POA: Diagnosis not present

## 2017-04-08 DIAGNOSIS — I480 Paroxysmal atrial fibrillation: Secondary | ICD-10-CM | POA: Diagnosis not present

## 2017-04-08 DIAGNOSIS — I5023 Acute on chronic systolic (congestive) heart failure: Secondary | ICD-10-CM

## 2017-04-08 HISTORY — PX: ULTRASOUND GUIDANCE FOR VASCULAR ACCESS: SHX6516

## 2017-04-08 HISTORY — PX: RIGHT/LEFT HEART CATH AND CORONARY/GRAFT ANGIOGRAPHY: CATH118267

## 2017-04-08 LAB — POCT I-STAT 3, VENOUS BLOOD GAS (G3P V)
Acid-base deficit: 3 mmol/L — ABNORMAL HIGH (ref 0.0–2.0)
Bicarbonate: 22.4 mmol/L (ref 20.0–28.0)
O2 Saturation: 61 %
TCO2: 24 mmol/L (ref 22–32)
pCO2, Ven: 38.6 mmHg — ABNORMAL LOW (ref 44.0–60.0)
pH, Ven: 7.371 (ref 7.250–7.430)
pO2, Ven: 32 mmHg (ref 32.0–45.0)

## 2017-04-08 LAB — BASIC METABOLIC PANEL
ANION GAP: 9 (ref 5–15)
BUN: 17 mg/dL (ref 6–20)
CALCIUM: 8.4 mg/dL — AB (ref 8.9–10.3)
CO2: 22 mmol/L (ref 22–32)
Chloride: 109 mmol/L (ref 101–111)
Creatinine, Ser: 0.83 mg/dL (ref 0.44–1.00)
GFR calc Af Amer: >60 mL/min (ref >60)
GLUCOSE: 139 mg/dL — AB (ref 65–99)
Potassium: 3.8 mmol/L (ref 3.5–5.1)
SODIUM: 140 mmol/L (ref 135–145)

## 2017-04-08 LAB — POCT I-STAT 3, ART BLOOD GAS (G3+)
Acid-base deficit: 4 mmol/L — ABNORMAL HIGH (ref 0.0–2.0)
Bicarbonate: 21.6 mmol/L (ref 20.0–28.0)
O2 Saturation: 95 %
TCO2: 23 mmol/L (ref 22–32)
pCO2 arterial: 40.3 mmHg (ref 32.0–48.0)
pH, Arterial: 7.337 — ABNORMAL LOW (ref 7.350–7.450)
pO2, Arterial: 82 mmHg — ABNORMAL LOW (ref 83.0–108.0)

## 2017-04-08 SURGERY — ULTRASOUND GUIDANCE, FOR VASCULAR ACCESS

## 2017-04-08 MED ORDER — ACETAMINOPHEN 325 MG PO TABS
650.0000 mg | ORAL_TABLET | ORAL | Status: DC | PRN
  Administered 2017-04-11: 650 mg via ORAL
  Filled 2017-04-08: qty 2

## 2017-04-08 MED ORDER — VERAPAMIL HCL 2.5 MG/ML IV SOLN
INTRAVENOUS | Status: AC
  Filled 2017-04-08: qty 2

## 2017-04-08 MED ORDER — LEVOTHYROXINE SODIUM 75 MCG PO TABS: 175.0000 ug | ORAL_TABLET | Freq: Every day | ORAL | Status: DC

## 2017-04-08 MED ORDER — HEPARIN (PORCINE) IN NACL 2-0.9 UNIT/ML-% IJ SOLN
INTRAMUSCULAR | Status: AC
  Filled 2017-04-08: qty 1000

## 2017-04-08 MED ORDER — MIDAZOLAM HCL 2 MG/2ML IJ SOLN
INTRAMUSCULAR | Status: DC | PRN
  Administered 2017-04-08: 1 mg via INTRAVENOUS

## 2017-04-08 MED ORDER — POTASSIUM CHLORIDE CRYS ER 20 MEQ PO TBCR
40.0000 meq | EXTENDED_RELEASE_TABLET | Freq: Two times a day (BID) | ORAL | Status: DC
  Administered 2017-04-08 – 2017-04-11 (×7): 40 meq via ORAL
  Filled 2017-04-08 (×7): qty 2

## 2017-04-08 MED ORDER — SODIUM CHLORIDE 0.9 % IV SOLN: 250.0000 mL | INTRAVENOUS | Status: DC

## 2017-04-08 MED ORDER — SODIUM CHLORIDE 0.9% FLUSH: 3.0000 mL | INTRAVENOUS | Status: DC | PRN

## 2017-04-08 MED ORDER — SODIUM CHLORIDE 0.9% FLUSH
3.0000 mL | Freq: Two times a day (BID) | INTRAVENOUS | Status: DC
  Administered 2017-04-08 – 2017-04-12 (×7): 3 mL via INTRAVENOUS

## 2017-04-08 MED ORDER — SODIUM CHLORIDE 0.9 % IV SOLN: 250.0000 mL | INTRAVENOUS | Status: DC | PRN

## 2017-04-08 MED ORDER — FENTANYL CITRATE (PF) 100 MCG/2ML IJ SOLN
INTRAMUSCULAR | Status: DC | PRN
Start: 2017-04-08 — End: 2017-04-08
  Administered 2017-04-08: 25 ug via INTRAVENOUS

## 2017-04-08 MED ORDER — ALBUTEROL SULFATE (2.5 MG/3ML) 0.083% IN NEBU: 3.0000 mL | INHALATION_SOLUTION | Freq: Four times a day (QID) | RESPIRATORY_TRACT | Status: DC | PRN

## 2017-04-08 MED ORDER — AMIODARONE HCL IN DEXTROSE 360-4.14 MG/200ML-% IV SOLN
30.0000 mg/h | INTRAVENOUS | Status: DC
  Administered 2017-04-08 – 2017-04-10 (×4): 30 mg/h via INTRAVENOUS
  Filled 2017-04-08 (×4): qty 200

## 2017-04-08 MED ORDER — FUROSEMIDE 10 MG/ML IJ SOLN
80.0000 mg | Freq: Two times a day (BID) | INTRAMUSCULAR | Status: DC
  Administered 2017-04-08 – 2017-04-11 (×7): 80 mg via INTRAVENOUS
  Filled 2017-04-08 (×7): qty 8

## 2017-04-08 MED ORDER — APIXABAN 5 MG PO TABS
5.0000 mg | ORAL_TABLET | Freq: Two times a day (BID) | ORAL | Status: DC
  Administered 2017-04-08 – 2017-04-13 (×10): 5 mg via ORAL
  Filled 2017-04-08 (×10): qty 1

## 2017-04-08 MED ORDER — LIDOCAINE HCL (PF) 1 % IJ SOLN
INTRAMUSCULAR | Status: DC | PRN
  Administered 2017-04-08: 20 mL

## 2017-04-08 MED ORDER — ACETAMINOPHEN 325 MG PO TABS
650.0000 mg | ORAL_TABLET | Freq: Four times a day (QID) | ORAL | Status: DC | PRN
  Administered 2017-04-08: 650 mg via ORAL
  Filled 2017-04-08: qty 2

## 2017-04-08 MED ORDER — HEPARIN SODIUM (PORCINE) 1000 UNIT/ML IJ SOLN
INTRAMUSCULAR | Status: AC
  Filled 2017-04-08: qty 1

## 2017-04-08 MED ORDER — LIDOCAINE HCL 2 % IJ SOLN
INTRAMUSCULAR | Status: AC
  Filled 2017-04-08: qty 10

## 2017-04-08 MED ORDER — IOPAMIDOL (ISOVUE-370) INJECTION 76%
INTRAVENOUS | Status: AC
  Filled 2017-04-08: qty 125

## 2017-04-08 MED ORDER — HEPARIN (PORCINE) IN NACL 2-0.9 UNIT/ML-% IJ SOLN
INTRAMUSCULAR | Status: AC | PRN
  Administered 2017-04-08: 1000 mL

## 2017-04-08 MED ORDER — MIDAZOLAM HCL 2 MG/2ML IJ SOLN
INTRAMUSCULAR | Status: AC
  Filled 2017-04-08: qty 2

## 2017-04-08 MED ORDER — LORATADINE 10 MG PO TABS
10.0000 mg | ORAL_TABLET | Freq: Every day | ORAL | Status: DC
  Administered 2017-04-08 – 2017-04-12 (×5): 10 mg via ORAL
  Filled 2017-04-08 (×6): qty 1

## 2017-04-08 MED ORDER — AMIODARONE HCL IN DEXTROSE 360-4.14 MG/200ML-% IV SOLN
60.0000 mg/h | INTRAVENOUS | Status: AC
  Administered 2017-04-08 (×2): 60 mg/h via INTRAVENOUS
  Filled 2017-04-08: qty 200

## 2017-04-08 MED ORDER — PANTOPRAZOLE SODIUM 40 MG PO TBEC
40.0000 mg | DELAYED_RELEASE_TABLET | Freq: Every day | ORAL | Status: DC
  Administered 2017-04-08 – 2017-04-13 (×7): 40 mg via ORAL
  Filled 2017-04-08 (×7): qty 1

## 2017-04-08 MED ORDER — ASPIRIN EC 81 MG PO TBEC
81.0000 mg | DELAYED_RELEASE_TABLET | Freq: Every day | ORAL | Status: DC
  Administered 2017-04-09 – 2017-04-13 (×5): 81 mg via ORAL
  Filled 2017-04-08 (×5): qty 1

## 2017-04-08 MED ORDER — ONDANSETRON HCL 4 MG/2ML IJ SOLN
4.0000 mg | Freq: Four times a day (QID) | INTRAMUSCULAR | Status: DC | PRN
  Administered 2017-04-11 (×2): 4 mg via INTRAVENOUS
  Filled 2017-04-08 (×2): qty 2

## 2017-04-08 MED ORDER — SODIUM CHLORIDE 0.9% FLUSH
3.0000 mL | Freq: Two times a day (BID) | INTRAVENOUS | Status: DC
  Administered 2017-04-09 – 2017-04-12 (×5): 3 mL via INTRAVENOUS

## 2017-04-08 MED ORDER — IOPAMIDOL (ISOVUE-370) INJECTION 76%
INTRAVENOUS | Status: DC | PRN
  Administered 2017-04-08: 95 mL via INTRAVENOUS

## 2017-04-08 MED ORDER — FENTANYL CITRATE (PF) 100 MCG/2ML IJ SOLN
INTRAMUSCULAR | Status: AC
  Filled 2017-04-08: qty 2

## 2017-04-08 SURGICAL SUPPLY — 12 items
CATH INFINITI 5FR MULTPACK ANG (CATHETERS) ×4 IMPLANT
CATH SWAN GANZ 7F STRAIGHT (CATHETERS) ×4 IMPLANT
COVER PRB 48X5XTLSCP FOLD TPE (BAG) ×2 IMPLANT
COVER PROBE 5X48 (BAG) ×2
HOVERMATT SINGLE USE (MISCELLANEOUS) ×4 IMPLANT
KIT HEART LEFT (KITS) ×4 IMPLANT
PACK CARDIAC CATHETERIZATION (CUSTOM PROCEDURE TRAY) ×4 IMPLANT
SHEATH PINNACLE 5F 10CM (SHEATH) ×4 IMPLANT
SHEATH PINNACLE 7F 10CM (SHEATH) ×4 IMPLANT
TRANSDUCER W/STOPCOCK (MISCELLANEOUS) ×4 IMPLANT
WIRE EMERALD 3MM-J .025X260CM (WIRE) ×4 IMPLANT
WIRE EMERALD 3MM-J .035X150CM (WIRE) ×4 IMPLANT

## 2017-04-08 NOTE — Interval H&P Note (Signed)
Cath Lab Visit (complete for each Cath Lab visit)  Clinical Evaluation Leading to the Procedure:   ACS: Yes.    Non-ACS:    Anginal Classification: CCS IV ; worsening shortness of breath  Anti-ischemic medical therapy: Minimal Therapy (1 class of medications)  Non-Invasive Test Results: High-risk stress test findings: cardiac mortality >3%/year  Prior CABG: Previous CABG      History and Physical Interval Note:  04/08/2017 7:34 AM  [name]  has presented today for surgery, with the diagnosis of cp  The various methods of treatment have been discussed with the patient and family. After consideration of risks, benefits and other options for treatment, the patient has consented to  Procedure(s): LEFT HEART CATH AND CORONARY ANGIOGRAPHY (N/A) as a surgical intervention .  The patient's history has been reviewed, patient examined, no change in status, stable for surgery.  I have reviewed the patient's chart and labs.  Questions were answered to the patient's satisfaction.     Lance Muss

## 2017-04-08 NOTE — Progress Notes (Signed)
Site area: Right groin a 5 french arterial and a 7 french venous sheath was removed  Site Prior to Removal:  Level 0  Pressure Applied For 20 MINUTES    Bedrest Beginning at 0930am  Manual:   Yes.    Patient Status During Pull:  stable  Post Pull Groin Site:  Level 0  Post Pull Instructions Given:  Yes.    Post Pull Pulses Present:  Yes.    Dressing Applied:  Yes.    Comments:  BP 80/60-96/58 upon arrival

## 2017-04-08 NOTE — Progress Notes (Signed)
CARDIAC REHAB PHASE I   Attempted to walk with pt x2, pt declined due to having just received IV lasix. Discussed plan of care with pt and husband, discussed goals for activity progression. Encouraged ambulation as tolerated. Pt in recliner call bell within reach. Will follow.  [id number] Joylene Grapes, RN, BSN 04/08/2017 3:24 PM

## 2017-04-08 NOTE — Consult Note (Signed)
Advanced Heart Failure Team Consult Note   Primary Physician: Dr Leandrew Koyanagi Primary Cardiologist:  Dr Diona Browner   Reason for Consultation: Heart Fialure   HPI:    [name] is seen today for evaluation of heart failure at the request of Dr Allyson Sabal .  [name] is a 71 year old with a history of CAD, S/P CABG 2003, HTN, PAF, hyperlipidemia, hypothyroidism, ICM, and chronic systolic/diastolic heart failure.  She has been short of breath with exertion over the last year. She says it has been getting worse over the last year. She has been taking lasix 20 mg daily with extra laisx 20 mg as needed. +Orthopnea sleeping 3 pillows.    Followed by Dr Diona Browner and was last seen 10/1. At that time she did not want to be referred to EP for ICD.    Admitted with chest pain on 10/11. Prior to admit she had myoview with high risk results. Chest pain was similar to pain from previous MI. Reported increased dyspnea with exertion.CXR with vascular congestion. Pertinent admission labs include:  K 3.3, Creatinine 1.06, Hgb 11.2, troponin 0.0. Today she underwent LHC/RHC as noted below.   SOB with exertion. + Orthopnea.   LHC 04/08/2017   Ost LM to LM lesion, 25 %stenosed.  Prox Cx lesion, 25 %stenosed.  2nd Mrg lesion, 99 %stenosed. SVG to OM is occluded.  Ost 2nd Mrg to 2nd Mrg lesion, 75 %stenosed.  Mid Cx lesion, 25 %stenosed.  Prox RCA lesion, 80 %stenosed.  Dist RCA lesion, 100 %stenosed. SVG to PLA is patent.  Mid LAD lesion, 100 %stenosed. LIMA to Lad is occluded  LV end diastolic pressure is moderately elevated.  There is no aortic valve stenosis.  Hemodynamic findings consistent with severe pulmonary hypertension.  Ao sat: 95%, PA sat 61%, CO 4.7 L/min; CI 2.57   RHC RA 21, PA 74/36 Mean  57, PCWP 28, CO 4.75, PVR 6.1  Severe three vessel CAD.  1/3 grafts patent.  Severe pulmonary hypertension. Could consider PCI of the RCA that feeds the PDA.  I think her DOE is related to  her pulmonary HTN, volume overload rather than her RCA stenosis.  PFTs 03/30/2017 FEV 1Pre 73% FEV1 Post 74% PEV 1 Post 1.3  FEV1FVC Ratio 84%     Myoview 03/2017 Large anterior , apical and lateral wall infarct with mild peri infarct ischemia involving the anterolateral wall at mid and basal level Diffuse hypokinesis with anterior and apical akinesis EF 21%.  ECHO 11/2016  Normal LV wall thickness with mild chamber dilatation and LVEF   approximately 25-30%. There is mid to apical anteroseptal and   apical akinesis with severe hypokinesis of the apical inferior   wall. Indeterminate diastolic function. Mildly thickened mitral   leaflets with mild mitral regurgitation. Mildly sclerotic aortic   valve. Moderately reduced right ventricular contraction. Mild   tricuspid regurgitation with PASP estimated 40 mmHg.   Review of Systems: [y] = yes,  = no   General: Weight gain [Y ]; Weight loss ; Anorexia ; Fatigue ; Fever ; Chills ; Weakness   Cardiac: Chest pain/pressure ; Resting SOB ; Exertional SOB [Y ]; Orthopnea ; Pedal Edema ; Palpitations ; Syncope ; Presyncope ; Paroxysmal nocturnal dyspnea[ ]   Pulmonary: Cough ; Wheezing[ ] ; Hemoptysis[ ] ; Sputum ; Snoring   GI: Vomiting[ ] ; Dysphagia[ ] ; Melena[ ] ; Hematochezia ;  Heartburn[ ] ; Abdominal pain ; Constipation ; Diarrhea ; BRBPR   GU: Hematuria[ ] ; Dysuria ; Nocturia[ ]   Vascular: Pain in legs with walking ; Pain in feet with lying flat ; Non-healing sores ; Stroke ; TIA ; Slurred speech ;  Neuro: Headaches[ ] ; Vertigo[ ] ; Seizures[ ] ; Paresthesias[ ] ;Blurred vision ; Diplopia ; Vision changes   Ortho/Skin: Arthritis ; Joint pain [ Y]; Muscle pain ; Joint swelling ; Back Pain ; Rash   Psych: Depression[ ] ; Anxiety[ ]   Heme: Bleeding problems ; Clotting disorders ; Anemia   Endocrine: Diabetes ; Thyroid  dysfunction[Y ]  Home Medications Prior to Admission medications   Medication Sig Start Date End Date Taking? Authorizing Provider  acetaminophen (TYLENOL) 500 MG tablet Take 1,000 mg by mouth every 6 (six) hours as needed for moderate pain.   Yes [provider]  albuterol (PROVENTIL HFA;VENTOLIN HFA) 108 (90 Base) MCG/ACT inhaler Inhale 1 puff into the lungs every 6 (six) hours as needed for wheezing or shortness of breath.   Yes [provider]  apixaban (ELIQUIS) 5 MG TABS tablet Take 1 tablet (5 mg total) by mouth 2 (two) times daily. 10/08/16 06/25/17 Yes Jonelle Sidle, MD  carvedilol (COREG) 12.5 MG tablet Take 1.5 tablets (18.75 mg total) by mouth 2 (two) times daily. 12/28/16 04/07/17 Yes Jodelle Gross, NP  Cholecalciferol (VITAMIN D3 PO) Take 1 tablet by mouth daily.   Yes [provider]  furosemide (LASIX) 20 MG tablet Take 20-40 mg by mouth daily. Alternating 20 mg and 40 mg every other day. 01/22/16 04/07/17 Yes [provider]  levothyroxine (SYNTHROID, LEVOTHROID) 175 MCG tablet Take 175 mcg by mouth daily before breakfast.   Yes [provider]  Magnesium 250 MG TABS Take 250 mg by mouth daily.    Yes [provider]  nitroGLYCERIN (NITRODUR - DOSED IN MG/24 HR) 0.2 mg/hr patch PLACE 1 PATCH ONTO THE SKIN DAILY AS NEEDED FOR CHEST PAINS 12/23/16  Yes Jonelle Sidle, MD  omeprazole (PRILOSEC) 40 MG capsule Take 40 mg by mouth daily.  01/22/16 04/07/17 Yes [provider]  potassium chloride SA (K-DUR,KLOR-CON) 10 MEQ tablet Take 1 tablet (10 mEq total) by mouth daily. 12/26/16  Yes Elliot Cousin, MD    Past Medical History: Past Medical History:  Diagnosis Date  . Asthma   . CAD (coronary artery disease)    Multivessel status post CABG in 2003  . Chronic combined systolic (congestive) and diastolic (congestive) heart failure (HCC) 12/25/2016  . Chronic systolic heart failure (HCC)   . Essential  hypertension   . GERD (gastroesophageal reflux disease)   . Gum disease   . History of atrial flutter    Cardioversion 2016 in [place]  . History of colonic polyps   . History of goiter   . Hyperlipidemia   . Hypothyroidism   . Ischemic cardiomyopathy 12/25/2016  . Myocardial infarction (HCC)    2003  . Peripheral arterial disease (HCC)    Stent revascularization 2015 - details not clear  . Renal insufficiency   . Type 2 diabetes mellitus (HCC)     Past Surgical History: Past Surgical History:  Procedure Laterality Date  . CHOLECYSTECTOMY     2012  . CORONARY ARTERY BYPASS GRAFT     2003  . PARATHYROIDECTOMY     2015  .  THYROIDECTOMY    . TONSILLECTOMY     1967    Family History: Family History  Problem Relation Age of Onset  . Stroke Mother   . Heart attack Father   . Breast cancer Maternal Aunt   . Breast cancer Paternal Aunt     Social History: Social History   Social History  . Marital status: Married    Spouse name: N/A  . Number of children: N/A  . Years of education: N/A   Social History Main Topics  . Smoking status: Former Smoker    Types: Cigarettes  . Smokeless tobacco: Never Used  . Alcohol use No  . Drug use: No  . Sexual activity: Not Asked   Other Topics Concern  . None   Social History Narrative  . None    Allergies:  Allergies  Allergen Reactions  . Oxycodone Other (See Comments)    hallucinations   . Pollen Extract Other (See Comments)    Sneezing, running nose.  . Statins Other (See Comments)    Cramping     Objective:    Vital Signs:   Temp:  [97.5 F (36.4 C)-98.2 F (36.8 C)] 97.5 F (36.4 C) (10/12 0455) Pulse Rate:  [0-109] 101 (10/12 1100) Resp:  [0-79] 18 (10/12 1130) BP: (60-143)/(19-101) 115/69 (10/12 1130) SpO2:  [0 %-100 %] 96 % (10/12 1130) Weight:  [182 lb 14.4 oz (83 kg)] 182 lb 14.4 oz (83 kg) (10/12 0455) Last BM Date: 04/06/17  Weight change: Filed Weights   04/07/17 0028 04/08/17 0455    Weight: 180 lb (81.6 kg) 182 lb 14.4 oz (83 kg)    Intake/Output:   Intake/Output Summary (Last 24 hours) at 04/08/17 1205 Last data filed at 04/07/17 1734  Gross per 24 hour  Intake              279 ml  Output                0 ml  Net              279 ml      Physical Exam    General:  Well appearing. No resp difficulty. In bed.  HEENT: normal Neck: supple. JVP to ear . Carotids 2+ bilat; no bruits. No lymphadenopathy or thyromegaly appreciated. Cor: PMI nondisplaced. Irregular rate & rhythm. No rubs, gallops or murmurs. Lungs: clear Abdomen: obese, soft, nontender, nondistended. No hepatosplenomegaly. No bruits or masses. Good bowel sounds. Extremities: no cyanosis, clubbing, rash, edema. R groin dressing intact.  Neuro: alert & orientedx3, cranial nerves grossly intact. moves all 4 extremities w/o difficulty. Affect pleasant   Telemetry    Afib 100s   EKG     Afib 87 bpm   Labs   Basic Metabolic Panel:  Recent Labs Lab 04/07/17 0052 04/08/17 0951  NA 137 140  K 3.3* 3.8  CL 105 109  CO2 20* 22  GLUCOSE 157* 139*  BUN 21* 17  CREATININE 1.06* 0.83  CALCIUM 8.0* 8.4*    Liver Function Tests: No results for input(s): AST, ALT, ALKPHOS, BILITOT, PROT, ALBUMIN in the last 168 hours. No results for input(s): LIPASE, AMYLASE in the last 168 hours. No results for input(s): AMMONIA in the last 168 hours.  CBC:  Recent Labs Lab 04/07/17 0052  WBC 8.9  HGB 11.2*  HCT 34.6*  MCV 86.7  PLT 210    Cardiac Enzymes: No results for input(s): CKTOTAL, CKMB, CKMBINDEX, TROPONINI in the last 168 hours.  BNP: BNP (last 3 results)  Recent Labs  12/24/16 0824  BNP 385.0*    ProBNP (last 3 results) No results for input(s): PROBNP in the last 8760 hours.   CBG: No results for input(s): GLUCAP in the last 168 hours.  Coagulation Studies:  Recent Labs  04/07/17 1156  LABPROT 18.4*  INR 1.54     Imaging    No results  found.   Medications:     Current Medications: . aspirin  324 mg Oral Once  . carvedilol  12.5 mg Oral BID WC  . furosemide  20 mg Oral Daily  . levothyroxine  175 mcg Oral QAC breakfast  . magnesium oxide  200 mg Oral Daily  . pantoprazole  40 mg Oral Daily  . sodium chloride flush  3 mL Intravenous Q12H     Infusions: . sodium chloride         Patient Profile  [name] is a 71 year old with history of CAD, CABG 2003, PAF, hypothyroidism, and chronic systolic heart failure admitted with increased dyspnea/chest pain.    Assessment/Plan   1. Dyspnea- In the setting of volume overload. ? COPD. PFTs obtained earlier this month. No obstruction noted.   2. CAD- CABG in 2003. S/P LHC 1/3 grafts patent. Per Dr Jobie Quaker could consider PCI to RCA. Will ask Dr Gala Romney to review.  Not statin due to cramping. Pharmacy will investigate. Check lipid panel in am. Offered zetia however she declined.   Cut back aspirin 81 mg daily.  3. A/C Systolic/Diastolic Heart Failure - ECHO 10/6211 EF 25-30% and has been low for years. Refused ICD. Can revisit later.   Volume status elevated. Elevated filling pressures on RHC. Start lasix 80 mg twice a day.  Stop bb for now. SBP has been soft.  Intolerant losartan due to dyspnea in the past ? If this was related volume overload.  Consider entresto later.  4. PAF - had successful cardioversion 2016 in [place]. Would like to try and restore NSR. Start amio drip.  Set DC-CV for next week.  Restart eliquis 5 mg twice a day. First dose tonight.  5. Hypothyroidsim- on synthroid. Check TSH 6. Obesity: Body mass index is 32.4 kg/m.  7. Suspected OSA- set up sleep study as an outpatient.    Consult cardiac rehab.  Length of Stay: 0  Tonye Becket, NP  04/08/2017, 12:05 PM  Advanced Heart Failure Team Pager [phone] (M-F; 7a - 4p)  Please contact [facility] for night-coverage after hours (4p -7a ) and weekends on [email or web address]  Patient seen and  examined with Tonye Becket, NP. We discussed all aspects of the encounter. I agree with the assessment and plan as stated above.   Cath films and echo images reviewed personally. 72 y/o woman as above with h/o ischemic CM with previous anterior MI and CABG in 2003. EF previous 30-35%. More recently 20-25% with moderate to severe RV dysfunction. Admitted with volume overload and class NYHA III-IIIB symptoms. Cath with no good targets for revascularization. Filling pressures elevated. CI 2.6. Agree with continuing diuresis and optimizing HF meds. Suspect AF also contributing to symptoms. Will start amio (I discussed with her) and plan TEE/DC-CV early next week. Will also need referral to CR and f/u in HF Clinic.   We discussed advanced therapies and likely not candidate for VAD (poor RV function) or transplant (age and BMI). Continue Eliquis.   Will also need outpatient sleeps study.   [name], MD  4:12 PM

## 2017-04-08 NOTE — Care Management Note (Signed)
Case Management Note  Patient Details  Name: [name] MRN: [id number] Date of Birth: 24-Nov-1945  Subjective/Objective:                 Patient coming from [facility], originally from home w spouse son DIL, two kids. Patient denies using DME non oxygen dependent, independent for ADLs, drives.  In obs for CP, s/p cath.    Action/Plan:  CM will continue to follow for DC planning.  Expected Discharge Date:                  Expected Discharge Plan:  Home/Self Care  In-House Referral:     Discharge planning Services  CM Consult  Post Acute Care Choice:    Choice offered to:     DME Arranged:    DME Agency:     HH Arranged:    HH Agency:     Status of Service:  In process, will continue to follow  If discussed at Long Length of Stay Meetings, dates discussed:    Additional Comments:  Lawerance Sabal, RN 04/08/2017, 11:58 AM

## 2017-04-08 NOTE — H&P (View-Only) (Signed)
Cardiology Consultation:   Patient ID: [name]; [id number]; [birth year]   Admit date: 04/07/2017 Date of Consult: 04/07/2017  Primary Care Provider: Juliette Alcide, MD Primary Cardiologist: Diona Browner   Patient Profile:   [name] is a 71 y.o. female with a hx of Chronic dyspnea on exertion, CAD, multivessel status post coronary artery bypass grafting in 2003, chronic combined systolic and diastolic heart failure, hypertension, history of atrial flutter with cardioversion in 2016, hyperlipidemia, hypothyroidism, and ischemic cardiomyopathy who is being seen today for the evaluation of recurrent chest pain, recent abnormal Lexiscan Myoview at the request of Dr. Trinda Pascal service  History of Present Illness:   [name] presented to the emergency room with complaints of chest pain and back pain described as stabbing pain in the left scapula, reminiscent of pain [name] experienced prior to MI in the past. No associated diaphoresis. States her breathing status has been tenuous for months, but worsening over the last few few weeks. Has had episodes of dizziness, and heart racing.  The patient was recently seen by Dr. Diona Browner on 03/28/2017 with symptoms of dyspnea and discomfort, was ordered a [employer]. The test was found to be high risk:   Study Result     Blood pressure demonstrated a normal response to exercise.  There was no ST segment deviation noted during stress.  Findings consistent with prior myocardial infarction with peri-infarct ischemia.  This is a high risk study.  The left ventricular ejection fraction is severely decreased (<30%).   Large anterior , apical and lateral wall infarct with mild peri infarct ischemia involving the anterolateral wall at mid and  Basal level Diffuse hypokinesis with anterior and apical akinesis EF 21% but patient in afib with PVC;s   On arrival to the emergency room the patient's blood pressure was 124/68, heart  rate 74, O2 sat 97%, [name] was afebrile. EKG revealed atrial fibrillation with T-wave flattening in the lateral, and inferior leads, heart rate of 87 bpm. Essentially unchanged from prior EKG in July 2018.  Pertinent labs, sodium 137, potassium 3.3, chloride 105, CO2 20, glucose 127, creatinine 1.06. Hemoglobin 11.2 hematocrit 34.6, white blood cells 8.9, platelets 210. Troponin negative 1. Chest x-ray revealed mild vascular congestion and mild cardiomegaly, no evidence of CHF or pneumonia. [name] was treated with aspirin, nitroglycerin, and Benadryl  ER physician, Dr. Wilkie Aye, spoke with cardiology fellow on-call overnight, who recommended transfer to [facility] for cardiac catheterization in light of symptoms and abnormal stress test. Apparently no beds are available or stepdown. [name] is wiling to be transferred to [facility] for cath if necessary.   Past Medical History:  Diagnosis Date  . Asthma   . CAD (coronary artery disease)    Multivessel status post CABG in 2003  . Chronic combined systolic (congestive) and diastolic (congestive) heart failure (HCC) 12/25/2016  . Chronic systolic heart failure (HCC)   . Essential hypertension   . GERD (gastroesophageal reflux disease)   . Gum disease   . History of atrial flutter    Cardioversion 2016 in [place]  . History of colonic polyps   . History of goiter   . Hyperlipidemia   . Hypothyroidism   . Ischemic cardiomyopathy 12/25/2016  . Myocardial infarction (HCC)    2003  . Peripheral arterial disease (HCC)    Stent revascularization 2015 - details not clear  . Renal insufficiency   . Type 2 diabetes mellitus (HCC)     Past Surgical History:  Procedure Laterality Date  .  CHOLECYSTECTOMY     2012  . CORONARY ARTERY BYPASS GRAFT     2003  . PARATHYROIDECTOMY     2015  . THYROIDECTOMY    . TONSILLECTOMY     1967     Home Medications:  Prior to Admission medications   Medication Sig Start Date End Date Taking? Authorizing Provider  acetaminophen  (TYLENOL) 500 MG tablet Take 1,000 mg by mouth every 6 (six) hours as needed for moderate pain.   Yes [provider]  albuterol (PROVENTIL HFA;VENTOLIN HFA) 108 (90 Base) MCG/ACT inhaler Inhale 1 puff into the lungs every 6 (six) hours as needed for wheezing or shortness of breath.   Yes [provider]  apixaban (ELIQUIS) 5 MG TABS tablet Take 1 tablet (5 mg total) by mouth 2 (two) times daily. 10/08/16 06/25/17 Yes Jonelle Sidle, MD  carvedilol (COREG) 12.5 MG tablet Take 1.5 tablets (18.75 mg total) by mouth 2 (two) times daily. 12/28/16 04/07/17 Yes Jodelle Gross, NP  Cholecalciferol (VITAMIN D3 PO) Take 1 tablet by mouth daily.   Yes [provider]  furosemide (LASIX) 20 MG tablet Take 20-40 mg by mouth daily. Alternating 20 mg and 40 mg every other day. 01/22/16 04/07/17 Yes [provider]  levothyroxine (SYNTHROID, LEVOTHROID) 175 MCG tablet Take 175 mcg by mouth daily before breakfast.   Yes [provider]  Magnesium 250 MG TABS Take 250 mg by mouth daily.    Yes [provider]  nitroGLYCERIN (NITRODUR - DOSED IN MG/24 HR) 0.2 mg/hr patch PLACE 1 PATCH ONTO THE SKIN DAILY AS NEEDED FOR CHEST PAINS 12/23/16  Yes Jonelle Sidle, MD  omeprazole (PRILOSEC) 40 MG capsule Take 40 mg by mouth daily.  01/22/16 04/07/17 Yes [provider]  potassium chloride SA (K-DUR,KLOR-CON) 10 MEQ tablet Take 1 tablet (10 mEq total) by mouth daily. 12/26/16  Yes Elliot Cousin, MD    Inpatient Medications: Scheduled Meds: . aspirin  324 mg Oral Once   Continuous Infusions:  PRN Meds:   Allergies:    Allergies  Allergen Reactions  . Oxycodone Other (See Comments)    hallucinations   . Pollen Extract Other (See Comments)    Sneezing, running nose.  . Statins Other (See Comments)    Cramping     Social History:   Social History   Social History  . Marital status: Married    Spouse name: N/A  . Number of children: N/A    . Years of education: N/A   Occupational History  . Not on file.   Social History Main Topics  . Smoking status: Former Smoker    Types: Cigarettes  . Smokeless tobacco: Never Used  . Alcohol use No  . Drug use: No  . Sexual activity: Not on file   Other Topics Concern  . Not on file   Social History Narrative  . No narrative on file    Family History:    Family History  Problem Relation Age of Onset  . Stroke Mother   . Heart attack Father   . Breast cancer Maternal Aunt   . Breast cancer Paternal Aunt      ROS:  Please see the history of present illness.  ROS  All other ROS reviewed and negative.     Physical Exam/Data:   Vitals:   04/07/17 0530 04/07/17 0638 04/07/17 0700 04/07/17 0800  BP: 106/68 130/87 105/76 136/67  Pulse:  87 81 96  Resp: 16 18 17  16  Temp:      TempSrc:      SpO2:  97% 100% 100%  Weight:      Height:       No intake or output data in the 24 hours ending 04/07/17 0934 Filed Weights   04/07/17 0028  Weight: 180 lb (81.6 kg)   Body mass index is 31.89 kg/m.  General:  Well nourished, well developed, in no acute distress HEENT: normal Lymph: no adenopathy Neck: no JVD Endocrine:  No thryomegaly Vascular: No carotid bruits; FA pulses 2+ bilaterally without bruits  Cardiac:  normal S1, S2; RRR; no murmur  Lungs:Some inspiratory wheezes, no coughing. Wearing O2  Abd: soft, nontender, no hepatomegaly  Ext: no edema Musculoskeletal:  No deformities, BUE and BLE strength normal and equal Skin: warm and dry  Neuro:  CNs 2-12 intact, no focal abnormalities noted Psych:  Normal affect   EKG:  The EKG was personally reviewed and demonstrates:  Atrial fib with inferior/lateral T-wave flattening.  Telemetry:  Telemetry was personally reviewed and demonstrates:  Atrial fib   Relevant CV Studies: Stress test as above  Echocardiogram 12/05/2016 Left ventricle: The cavity size was mildly dilated. Wall   thickness was normal. Systolic  function was severely reduced. The   estimated ejection fraction was in the range of 25% to 30%. There   is akinesis of the mid-apicalanteroseptal and apical myocardium.   There is severe hypokinesis of the apicalinferior myocardium. The   study is not technically sufficient to allow evaluation of LV   diastolic function. - Aortic valve: Trileaflet; mildly calcified leaflets. - Mitral valve: Mildly thickened leaflets . There was mild   regurgitation. - Right ventricle: Systolic function was moderately reduced. - Tricuspid valve: There was mild regurgitation. - Pulmonary arteries: PA peak pressure: 40 mm Hg (S). - Pericardium, extracardiac: There was no pericardial effusion.  Holter Monitor 01/04/2017 Notes recorded by Jodelle Gross, NP on 01/11/2017 at 2:53 PM EDT Still in atrial fib, but HR average is 72 bpm. Some elevations. [name] is having lots of PVC's Increase carvedilol to 18.75 mg BID,   Laboratory Data:  Chemistry Recent Labs Lab 04/07/17 0052  NA 137  K 3.3*  CL 105  CO2 20*  GLUCOSE 157*  BUN 21*  CREATININE 1.06*  CALCIUM 8.0*  GFRNONAA 52*  GFRAA >60  ANIONGAP 12    Hematology Recent Labs Lab 03/31/17 1016 04/07/17 0052  WBC 7.6 8.9  RBC 4.33 3.99  HGB 12.1 11.2*  HCT 38.1 34.6*  MCV 88.0 86.7  MCH 27.9 28.1  MCHC 31.8 32.4  RDW 16.4* 16.0*  PLT 199 210   Cardiac EnzymesNo results for input(s): TROPONINI in the last 168 hours.  Recent Labs Lab 04/07/17 0056  TROPIPOC 0.00     Radiology/Studies:  Nm Myocar Multi W/spect W/wall Motion / Ef  Result Date: 04/04/2017  Blood pressure demonstrated a normal response to exercise.  There was no ST segment deviation noted during stress.  Findings consistent with prior myocardial infarction with peri-infarct ischemia.  This is a high risk study.  The left ventricular ejection fraction is severely decreased (<30%).  Large anterior , apical and lateral wall infarct with mild peri infarct ischemia  involving the anterolateral wall at mid and Basal level Diffuse hypokinesis with anterior and apical akinesis EF 21% but patient in afib with PVC;s   Dg Chest Portable 1 View  Result Date: 04/07/2017 CLINICAL DATA:  Acute onset of shortness of breath and cough.  Left lower posterior chest pain. Initial encounter. EXAM: PORTABLE CHEST 1 VIEW COMPARISON:  Chest radiograph performed 12/24/2016 FINDINGS: The lungs are well-aerated. Mild vascular congestion is noted. There is no evidence of focal opacification, pleural effusion or pneumothorax. The cardiomediastinal silhouette is mildly enlarged. The patient is status post median sternotomy. No acute osseous abnormalities are seen. IMPRESSION: Mild vascular congestion and mild cardiomegaly. Lungs remain grossly clear. Electronically Signed   By: Roanna Raider M.D.   On: 04/07/2017 01:14    Assessment and Plan:   1. Chest Pain: Typical and atypical. but reminiscent of prior MI symptoms for patient. [name] is currently a nitroglycerin drip and oxygen and pain-free. Initial troponin negative. EKG is unchanged from prior EKG July 2018. [name] did have abnormal Lexiscan Myoview on 03/28/2017 which was described as high-risk likely due to reduced EF, large defect from prior MI, and mild peri-infarct ischemia.   Uncertain that [name] needs to be transferred for cardiac cath at this time as there is no evidence for ACS, however with recurrent symptoms [name] may need to be evaluated with angiography of her grafts. [name] remains on ELIQUIS which can be held for 24 hours prior to cath. We'll discuss with Dr. Eden Emms timing and need for transfer for catheterization. The patient is willing to proceed.  The patient understands that risks include but are not limited to stroke (1 in 1000), death (1 in 1000), kidney failure [usually temporary] (1 in 500), bleeding (1 in 200), allergic reaction [possibly serious] (1 in 200), and agrees to proceed.  [name] is currently intolerant to statins  causing severe myalgia pain.  2. Ischemic cardiomyopathy: Most recent echocardiogram revealing an EF of 25%-30% [name] did have a discussion with Dr. Diona Browner concerning ICD implantation. [name] is willing to proceed with this only if this is necessary and will be beneficial to her. [name] is having some dizziness and rapid heart rhythm. Holter monitor did not reveal ventricular tachycardia one placed in July 2018. For now would continue carvedilol 12.75 mg twice a day (lower dose as [name] is on NTG gtt), [name] remains on Lasix. Will restart her medications in ER while awaiting need for transfer or admission to [facility].   3. Atrial fibrillation: Heart rate is currently well-controlled. [name] remains on ELIQUIS. Will need to hold this prior to cath.CHADS VASC Score of 6.   4. Hypertension: Blood pressure is not optimal for current EF. Would like to begin lisinopril 2.5 mg daily. [name]  did not tolerate ARB (Cozaar), due to worsening breathing status. [name] stopped taking it on her own.  5. Chronic dyspnea: [name] was scheduled for PFTs on last office visit. This can be pursued after discharge.  6. Hypokalemia: Potassium 3.3 on admission. Will replete this. Should keep at 4.0 for cardiac patients.   7.Hypothyroidism: Restart Levothyroxine at home dose.    For questions or updates, please contact [facility] Please consult [email or web address] for contact info under Cardiology/STEMI.   Signed, Joni Reining DNP, ANP, AACC  04/07/2017 9:34 AM   Patient examined chart reviewed. Discussed care with NP and patient SSCP with LBBB and negative troponin Old CABG [name] is class 3 CHF With fairly marked dyspnea on exertion. [name] is pain free now on nitro  Discussed options and favor transfer to [facility] for right and left cath Friday Holding eliquis for a day should be fine. Can assess further Rx afib after cath And consider [facility]. Patient willing to go to [facility] Continue coreg for rate control and CAD start entresto  post cath for CHF  depending on filling pressures  Exam with  Overweight white female basilar crackles no murmur trace LE edema   Charlton Haws

## 2017-04-08 NOTE — Care Management Obs Status (Signed)
MEDICARE OBSERVATION STATUS NOTIFICATION   Patient Details  Name: [name] MRN: [id number] Date of Birth: 1946-04-03   Medicare Observation Status Notification Given:  Yes    Lawerance Sabal, RN 04/08/2017, 11:58 AM

## 2017-04-09 DIAGNOSIS — I739 Peripheral vascular disease, unspecified: Secondary | ICD-10-CM | POA: Diagnosis present

## 2017-04-09 DIAGNOSIS — J449 Chronic obstructive pulmonary disease, unspecified: Secondary | ICD-10-CM | POA: Diagnosis present

## 2017-04-09 DIAGNOSIS — K219 Gastro-esophageal reflux disease without esophagitis: Secondary | ICD-10-CM | POA: Diagnosis present

## 2017-04-09 DIAGNOSIS — I4891 Unspecified atrial fibrillation: Secondary | ICD-10-CM | POA: Diagnosis not present

## 2017-04-09 DIAGNOSIS — I340 Nonrheumatic mitral (valve) insufficiency: Secondary | ICD-10-CM | POA: Diagnosis not present

## 2017-04-09 DIAGNOSIS — I200 Unstable angina: Secondary | ICD-10-CM | POA: Diagnosis not present

## 2017-04-09 DIAGNOSIS — E039 Hypothyroidism, unspecified: Secondary | ICD-10-CM

## 2017-04-09 DIAGNOSIS — G4733 Obstructive sleep apnea (adult) (pediatric): Secondary | ICD-10-CM | POA: Diagnosis present

## 2017-04-09 DIAGNOSIS — I481 Persistent atrial fibrillation: Secondary | ICD-10-CM | POA: Diagnosis present

## 2017-04-09 DIAGNOSIS — E669 Obesity, unspecified: Secondary | ICD-10-CM | POA: Diagnosis present

## 2017-04-09 DIAGNOSIS — I480 Paroxysmal atrial fibrillation: Secondary | ICD-10-CM | POA: Diagnosis present

## 2017-04-09 DIAGNOSIS — I081 Rheumatic disorders of both mitral and tricuspid valves: Secondary | ICD-10-CM | POA: Diagnosis present

## 2017-04-09 DIAGNOSIS — I272 Pulmonary hypertension, unspecified: Secondary | ICD-10-CM | POA: Diagnosis present

## 2017-04-09 DIAGNOSIS — E876 Hypokalemia: Secondary | ICD-10-CM | POA: Diagnosis present

## 2017-04-09 DIAGNOSIS — I25708 Atherosclerosis of coronary artery bypass graft(s), unspecified, with other forms of angina pectoris: Secondary | ICD-10-CM | POA: Diagnosis present

## 2017-04-09 DIAGNOSIS — I110 Hypertensive heart disease with heart failure: Secondary | ICD-10-CM | POA: Diagnosis present

## 2017-04-09 DIAGNOSIS — I255 Ischemic cardiomyopathy: Secondary | ICD-10-CM | POA: Diagnosis present

## 2017-04-09 DIAGNOSIS — I493 Ventricular premature depolarization: Secondary | ICD-10-CM | POA: Diagnosis present

## 2017-04-09 DIAGNOSIS — K5900 Constipation, unspecified: Secondary | ICD-10-CM | POA: Diagnosis present

## 2017-04-09 DIAGNOSIS — E1169 Type 2 diabetes mellitus with other specified complication: Secondary | ICD-10-CM

## 2017-04-09 DIAGNOSIS — E890 Postprocedural hypothyroidism: Secondary | ICD-10-CM | POA: Diagnosis present

## 2017-04-09 DIAGNOSIS — R9439 Abnormal result of other cardiovascular function study: Secondary | ICD-10-CM | POA: Diagnosis present

## 2017-04-09 DIAGNOSIS — N289 Disorder of kidney and ureter, unspecified: Secondary | ICD-10-CM | POA: Diagnosis present

## 2017-04-09 DIAGNOSIS — I5043 Acute on chronic combined systolic (congestive) and diastolic (congestive) heart failure: Secondary | ICD-10-CM | POA: Diagnosis present

## 2017-04-09 DIAGNOSIS — I4892 Unspecified atrial flutter: Secondary | ICD-10-CM | POA: Diagnosis present

## 2017-04-09 DIAGNOSIS — E785 Hyperlipidemia, unspecified: Secondary | ICD-10-CM | POA: Diagnosis present

## 2017-04-09 DIAGNOSIS — J310 Chronic rhinitis: Secondary | ICD-10-CM | POA: Diagnosis present

## 2017-04-09 DIAGNOSIS — R072 Precordial pain: Secondary | ICD-10-CM | POA: Diagnosis present

## 2017-04-09 DIAGNOSIS — I5023 Acute on chronic systolic (congestive) heart failure: Secondary | ICD-10-CM | POA: Diagnosis not present

## 2017-04-09 DIAGNOSIS — E1151 Type 2 diabetes mellitus with diabetic peripheral angiopathy without gangrene: Secondary | ICD-10-CM | POA: Diagnosis present

## 2017-04-09 LAB — HEPATIC FUNCTION PANEL
ALBUMIN: 4.1 g/dL (ref 3.5–5.0)
ALK PHOS: 105 U/L (ref 38–126)
ALT: 23 U/L (ref 14–54)
AST: 26 U/L (ref 15–41)
BILIRUBIN DIRECT: 0.2 mg/dL (ref 0.1–0.5)
BILIRUBIN TOTAL: 0.8 mg/dL (ref 0.3–1.2)
Indirect Bilirubin: 0.6 mg/dL (ref 0.3–0.9)
Total Protein: 6.3 g/dL — ABNORMAL LOW (ref 6.5–8.1)

## 2017-04-09 LAB — BASIC METABOLIC PANEL
ANION GAP: 11 (ref 5–15)
BUN: 19 mg/dL (ref 6–20)
CHLORIDE: 107 mmol/L (ref 101–111)
CO2: 21 mmol/L — AB (ref 22–32)
Calcium: 8.7 mg/dL — ABNORMAL LOW (ref 8.9–10.3)
Creatinine, Ser: 0.98 mg/dL (ref 0.44–1.00)
GFR calc non Af Amer: 58 mL/min — ABNORMAL LOW (ref >60)
GLUCOSE: 152 mg/dL — AB (ref 65–99)
Potassium: 4.3 mmol/L (ref 3.5–5.1)
Sodium: 139 mmol/L (ref 135–145)

## 2017-04-09 LAB — TSH: TSH: 4.502 u[IU]/mL — ABNORMAL HIGH (ref 0.350–4.500)

## 2017-04-09 LAB — LIPID PANEL
Cholesterol: 153 mg/dL (ref 0–200)
HDL: 31 mg/dL — AB (ref >40)
LDL CALC: 98 mg/dL (ref 0–99)
Total CHOL/HDL Ratio: 4.9 RATIO
Triglycerides: 122 mg/dL (ref <150)
VLDL: 24 mg/dL (ref 0–40)

## 2017-04-09 LAB — MAGNESIUM: Magnesium: 1 mg/dL — ABNORMAL LOW (ref 1.7–2.4)

## 2017-04-09 MED ORDER — MAGNESIUM SULFATE 4 GM/100ML IV SOLN
4.0000 g | Freq: Once | INTRAVENOUS | Status: AC
  Administered 2017-04-09: 4 g via INTRAVENOUS
  Filled 2017-04-09: qty 100

## 2017-04-09 MED ORDER — OXYMETAZOLINE HCL 0.05 % NA SOLN
1.0000 | Freq: Two times a day (BID) | NASAL | Status: DC | PRN
  Administered 2017-04-09: 1 via NASAL
  Filled 2017-04-09: qty 15

## 2017-04-09 MED ORDER — MAGNESIUM OXIDE 400 (241.3 MG) MG PO TABS
400.0000 mg | ORAL_TABLET | Freq: Two times a day (BID) | ORAL | Status: DC
  Administered 2017-04-09 – 2017-04-13 (×8): 400 mg via ORAL
  Filled 2017-04-09 (×8): qty 1

## 2017-04-09 MED ORDER — FLUTICASONE PROPIONATE 50 MCG/ACT NA SUSP
1.0000 | Freq: Every day | NASAL | Status: DC
  Administered 2017-04-09 – 2017-04-13 (×5): 1 via NASAL
  Filled 2017-04-09: qty 16

## 2017-04-09 MED ORDER — BACITRACIN-NEOMYCIN-POLYMYXIN OINTMENT TUBE
TOPICAL_OINTMENT | Freq: Every day | CUTANEOUS | Status: DC
  Administered 2017-04-09 – 2017-04-13 (×4): via TOPICAL
  Filled 2017-04-09: qty 14.17

## 2017-04-09 MED ORDER — TRAZODONE HCL 100 MG PO TABS
100.0000 mg | ORAL_TABLET | Freq: Every day | ORAL | Status: DC
  Administered 2017-04-09 – 2017-04-12 (×4): 100 mg via ORAL
  Filled 2017-04-09 (×4): qty 1

## 2017-04-09 NOTE — Progress Notes (Signed)
Pt C/O insomnia and post nasal drip and congestion. Pt voice that she was scared and anxioous from the cath result that she had received yesterday. Dr. Berdine Dance was notified. Pt was given trazadone and afrin nasal spray as ordered.

## 2017-04-09 NOTE — Progress Notes (Signed)
DAILY PROGRESS NOTE   Patient Name: [name] Date of Encounter: 04/09/2017  Hospital Problem List   Principal Problem:   Acute on chronic systolic heart failure [facility]) Active Problems:   Hypothyroidism   Type 2 diabetes mellitus with other specified complication (HCC)   Chest pain   Abnormal stress test    Chief Complaint   Breathing better, runny nose  Subjective   Diuresed well overnight - 2.7L negative.  Breathing a little better. Runny nose overnight - box of tissues on the floor. Given afrin with some relief, but symptoms have returned. Remains in a-fib. Cardioversion scheduled for Tuesday. Started on Eliquis.  Objective   Vitals:   04/08/17 2003 04/08/17 2356 04/09/17 0500 04/09/17 0732  BP: (!) 130/93 (!) 157/69 (!) 122/91   Pulse: (!) 112 87 90 89  Resp: (!) 21 (!) 24 (!) 24 15  Temp: 98.7 F (37.1 C) 97.6 F (36.4 C) 97.8 F (36.6 C)   TempSrc: Oral Oral Oral   SpO2: 92% 99% 100%   Weight:   180 lb 1.6 oz (81.7 kg)   Height:        Intake/Output Summary (Last 24 hours) at 04/09/17 1251 Last data filed at 04/09/17 1017  Gross per 24 hour  Intake            946.5 ml  Output             3750 ml  Net          -2803.5 ml   Filed Weights   04/07/17 0028 04/08/17 0455 04/09/17 0500  Weight: 180 lb (81.6 kg) 182 lb 14.4 oz (83 kg) 180 lb 1.6 oz (81.7 kg)    Physical Exam   General appearance: alert and no distress Neck: no carotid bruit, no JVD and thyroid not enlarged, symmetric, no tenderness/mass/nodules Lungs: clear to auscultation bilaterally Heart: irregularly irregular rhythm Abdomen: soft, non-tender; bowel sounds normal; no masses,  no organomegaly and obese Extremities: extremities normal, atraumatic, no cyanosis or edema Pulses: 2+ and symmetric Skin: Skin color, texture, turgor normal. No rashes or lesions Neurologic: Grossly normal Psych: Pleasant  Inpatient Medications    Scheduled Meds: . apixaban  5 mg Oral BID  . aspirin  EC  81 mg Oral Daily  . furosemide  80 mg Intravenous BID  . levothyroxine  175 mcg Oral QAC breakfast  . loratadine  10 mg Oral Daily  . magnesium oxide  200 mg Oral Daily  . pantoprazole  40 mg Oral Daily  . potassium chloride  40 mEq Oral BID  . sodium chloride flush  3 mL Intravenous Q12H  . sodium chloride flush  3 mL Intravenous Q12H  . traZODone  100 mg Oral QHS    Continuous Infusions: . sodium chloride    . sodium chloride    . amiodarone 30 mg/hr (04/09/17 0555)    PRN Meds: sodium chloride, acetaminophen, albuterol, ondansetron (ZOFRAN) IV, oxymetazoline, sodium chloride flush, sodium chloride flush   Labs   Results for orders placed or performed during the hospital encounter of 04/07/17 (from the past 48 hour(s))  I-STAT 3, arterial blood gas (G3+)     Status: Abnormal   Collection Time: 04/08/17  8:09 AM  Result Value Ref Range   pH, Arterial 7.337 (L) 7.350 - 7.450   pCO2 arterial 40.3 32.0 - 48.0 mmHg   pO2, Arterial 82.0 (L) 83.0 - 108.0 mmHg   Bicarbonate 21.6 20.0 - 28.0 mmol/L   TCO2  23 22 - 32 mmol/L   O2 Saturation 95.0 %   Acid-base deficit 4.0 (H) 0.0 - 2.0 mmol/L   Patient temperature HIDE    Sample type ARTERIAL   I-STAT 3, venous blood gas (G3P V)     Status: Abnormal   Collection Time: 04/08/17  8:13 AM  Result Value Ref Range   pH, Ven 7.371 7.250 - 7.430   pCO2, Ven 38.6 (L) 44.0 - 60.0 mmHg   pO2, Ven 32.0 32.0 - 45.0 mmHg   Bicarbonate 22.4 20.0 - 28.0 mmol/L   TCO2 24 22 - 32 mmol/L   O2 Saturation 61.0 %   Acid-base deficit 3.0 (H) 0.0 - 2.0 mmol/L   Patient temperature HIDE    Sample type VENOUS    Comment NOTIFIED PHYSICIAN   Basic metabolic panel     Status: Abnormal   Collection Time: 04/08/17  9:51 AM  Result Value Ref Range   Sodium 140 135 - 145 mmol/L   Potassium 3.8 3.5 - 5.1 mmol/L   Chloride 109 101 - 111 mmol/L   CO2 22 22 - 32 mmol/L   Glucose, Bld 139 (H) 65 - 99 mg/dL   BUN 17 6 - 20 mg/dL   Creatinine, Ser 0.83  0.44 - 1.00 mg/dL   Calcium 8.4 (L) 8.9 - 10.3 mg/dL   GFR calc non Af Amer >60 >60 mL/min   GFR calc Af Amer >60 >60 mL/min    Comment: (NOTE) The eGFR has been calculated using the CKD EPI equation. This calculation has not been validated in all clinical situations. eGFR's persistently <60 mL/min signify possible Chronic Kidney Disease.    Anion gap 9 5 - 15  TSH     Status: Abnormal   Collection Time: 04/09/17  3:31 AM  Result Value Ref Range   TSH 4.502 (H) 0.350 - 4.500 uIU/mL    Comment: Performed by a 3rd Generation assay with a functional sensitivity of <=0.01 uIU/mL.  Basic metabolic panel     Status: Abnormal   Collection Time: 04/09/17  3:31 AM  Result Value Ref Range   Sodium 139 135 - 145 mmol/L   Potassium 4.3 3.5 - 5.1 mmol/L   Chloride 107 101 - 111 mmol/L   CO2 21 (L) 22 - 32 mmol/L   Glucose, Bld 152 (H) 65 - 99 mg/dL   BUN 19 6 - 20 mg/dL   Creatinine, Ser 0.98 0.44 - 1.00 mg/dL   Calcium 8.7 (L) 8.9 - 10.3 mg/dL   GFR calc non Af Amer 58 (L) >60 mL/min   GFR calc Af Amer >60 >60 mL/min    Comment: (NOTE) The eGFR has been calculated using the CKD EPI equation. This calculation has not been validated in all clinical situations. eGFR's persistently <60 mL/min signify possible Chronic Kidney Disease.    Anion gap 11 5 - 15  Hepatic function panel     Status: Abnormal   Collection Time: 04/09/17  3:31 AM  Result Value Ref Range   Total Protein 6.3 (L) 6.5 - 8.1 g/dL   Albumin 4.1 3.5 - 5.0 g/dL   AST 26 15 - 41 U/L   ALT 23 14 - 54 U/L   Alkaline Phosphatase 105 38 - 126 U/L   Total Bilirubin 0.8 0.3 - 1.2 mg/dL   Bilirubin, Direct 0.2 0.1 - 0.5 mg/dL   Indirect Bilirubin 0.6 0.3 - 0.9 mg/dL  Magnesium     Status: Abnormal   Collection Time: 04/09/17  3:31 AM  Result Value Ref Range   Magnesium 1.0 (L) 1.7 - 2.4 mg/dL  Lipid panel     Status: Abnormal   Collection Time: 04/09/17  3:31 AM  Result Value Ref Range   Cholesterol 153 0 - 200 mg/dL    Triglycerides 122 <150 mg/dL   HDL 31 (L) >40 mg/dL   Total CHOL/HDL Ratio 4.9 RATIO   VLDL 24 0 - 40 mg/dL   LDL Cholesterol 98 0 - 99 mg/dL    Comment:        Total Cholesterol/HDL:CHD Risk Coronary Heart Disease Risk Table                     Men   Women  1/2 Average Risk   3.4   3.3  Average Risk       5.0   4.4  2 X Average Risk   9.6   7.1  3 X Average Risk  23.4   11.0        Use the calculated Patient Ratio above and the CHD Risk Table to determine the patient's CHD Risk.        ATP III CLASSIFICATION (LDL):  <100     mg/dL   Optimal  100-129  mg/dL   Near or Above                    Optimal  130-159  mg/dL   Borderline  160-189  mg/dL   High  >190     mg/dL   Very High     ECG   N/A  Telemetry   A_fib with CVR - Personally Reviewed  Radiology    No results found.  Cardiac Studies   Procedures   RIGHT/LEFT HEART CATH AND CORONARY/GRAFT ANGIOGRAPHY   Conclusion     Ost LM to LM lesion, 25 %stenosed.  Prox Cx lesion, 25 %stenosed.  2nd Mrg lesion, 99 %stenosed. SVG to OM is occluded.  Ost 2nd Mrg to 2nd Mrg lesion, 75 %stenosed.  Mid Cx lesion, 25 %stenosed.  Prox RCA lesion, 80 %stenosed.  Dist RCA lesion, 100 %stenosed. SVG to PLA is patent.  Mid LAD lesion, 100 %stenosed. LIMA to Lad is occluded  LV end diastolic pressure is moderately elevated.  There is no aortic valve stenosis.  Hemodynamic findings consistent with severe pulmonary hypertension.  Ao sat: 95%, PA sat 61%, CO 4.7 L/min; CI 2.57   Severe three vessel CAD.  1/3 grafts patent.  Severe pulmonary hypertension.   Plan diuresis and management from the heart failure team.  EF known to be severely reduced.  Could consider PCI of the RCA that feeds the PDA.  I think her DOE is related to her pulmonary HTN, volume overload rather than her RCA stenosis.    Assessment   Principal Problem:   Acute on chronic systolic heart failure (HCC) Active Problems:    Hypothyroidism   Type 2 diabetes mellitus with other specified complication (HCC)   Chest pain   Abnormal stress test   Plan   1. Diuresed well overnight- will continue diuresis today. Weight may not be accurate. Breathing has improved. Will switch to nasal steroid for rhinitis. On IV amiodarone and Eliquis. Plan for TEE-DCCV on Tuesday.  Time Spent Directly with Patient:  I have spent a total of 25 minutes with the patient reviewing hospital notes, telemetry, EKGs, labs and examining the patient as well as establishing an assessment and plan that was  discussed personally with the patient. > 50% of time was spent in direct patient care.  Length of Stay:  LOS: 0 days   Pixie Casino, MD, [place]  Attending Cardiologist  Direct Dial: [phone]  Fax: [phone]  Website:  [email or web address] [email or web address] Burnell Hurta 04/09/2017, 12:51 PM

## 2017-04-09 NOTE — Progress Notes (Signed)
CARDIAC REHAB PHASE I   PRE:  Rate/Rhythm: atrial fibrillation  BP:    Sitting: 125/96     SaO2: 100%  MODE:  Ambulation: 150 ft   POST:  Rate/Rhythem: atrial fibrillation 95  BP:    Sitting: 116/94     SaO2: 96% Room Air  [phone] Patient ambulated in the hallway using a rolling walker. Patient stopped to rest twice. Patient complaining of post nasal drip. Patient assisted back to recliner with call light within reach.   Thayer Headings RN BSB

## 2017-04-10 LAB — BASIC METABOLIC PANEL
Anion gap: 11 (ref 5–15)
BUN: 18 mg/dL (ref 6–20)
CHLORIDE: 105 mmol/L (ref 101–111)
CO2: 23 mmol/L (ref 22–32)
CREATININE: 0.94 mg/dL (ref 0.44–1.00)
Calcium: 8.7 mg/dL — ABNORMAL LOW (ref 8.9–10.3)
GFR calc Af Amer: >60 mL/min (ref >60)
GFR calc non Af Amer: >60 mL/min (ref >60)
GLUCOSE: 161 mg/dL — AB (ref 65–99)
Potassium: 4.1 mmol/L (ref 3.5–5.1)
Sodium: 139 mmol/L (ref 135–145)

## 2017-04-10 LAB — MAGNESIUM: Magnesium: 2.4 mg/dL (ref 1.7–2.4)

## 2017-04-10 MED ORDER — AMIODARONE HCL 200 MG PO TABS
400.0000 mg | ORAL_TABLET | Freq: Every day | ORAL | Status: DC
  Administered 2017-04-10 – 2017-04-11 (×2): 400 mg via ORAL
  Filled 2017-04-10 (×2): qty 2

## 2017-04-10 NOTE — Progress Notes (Signed)
DAILY PROGRESS NOTE   Patient Name: [name] Date of Encounter: 04/10/2017  Hospital Problem List   Principal Problem:   Acute on chronic systolic heart failure [facility]) Active Problems:   Hypothyroidism   Type 2 diabetes mellitus with other specified complication (HCC)   Chest pain   Abnormal stress test    Chief Complaint   Feels better today  Subjective   Breathing has further improved overnight. Heart rate is well-controlled on IV amiodarone. She's been sufficiently loaded I feel we could switch her to oral amiodarone today. Diuresed additional 2 L negative overnight now 4.5 L negative. Creatinine remained stable.  Objective   Vitals:   04/09/17 0732 04/09/17 1951 04/10/17 0012 04/10/17 0514  BP:  112/79 113/74 (!) 133/94  Pulse: 89 82 83 74  Resp: 15 (!) 23 14 (!) 25  Temp:  98.1 F (36.7 C) 98 F (36.7 C) 97.8 F (36.6 C)  TempSrc:  Oral Oral Oral  SpO2:  96% 98% 99%  Weight:      Height:        Intake/Output Summary (Last 24 hours) at 04/10/17 1157 Last data filed at 04/10/17 0844  Gross per 24 hour  Intake           459.54 ml  Output             4050 ml  Net         -3590.46 ml   Filed Weights   04/07/17 0028 04/08/17 0455 04/09/17 0500  Weight: 180 lb (81.6 kg) 182 lb 14.4 oz (83 kg) 180 lb 1.6 oz (81.7 kg)    Physical Exam   General appearance: alert and no distress Lungs: clear to auscultation bilaterally Heart: irregularly irregular rhythm Extremities: extremities normal, atraumatic, no cyanosis or edema Pulses: 2+ and symmetric Neurologic: Grossly normal  Inpatient Medications    Scheduled Meds: . apixaban  5 mg Oral BID  . aspirin EC  81 mg Oral Daily  . fluticasone  1 spray Each Nare Daily  . furosemide  80 mg Intravenous BID  . levothyroxine  175 mcg Oral QAC breakfast  . loratadine  10 mg Oral Daily  . magnesium oxide  400 mg Oral BID  . neomycin-bacitracin-polymyxin   Topical Daily  . pantoprazole  40 mg Oral Daily  .  potassium chloride  40 mEq Oral BID  . sodium chloride flush  3 mL Intravenous Q12H  . sodium chloride flush  3 mL Intravenous Q12H  . traZODone  100 mg Oral QHS    Continuous Infusions: . sodium chloride    . sodium chloride    . amiodarone 30 mg/hr (04/10/17 0523)    PRN Meds: sodium chloride, acetaminophen, albuterol, ondansetron (ZOFRAN) IV, sodium chloride flush, sodium chloride flush   Labs   Results for orders placed or performed during the hospital encounter of 04/07/17 (from the past 48 hour(s))  TSH     Status: Abnormal   Collection Time: 04/09/17  3:31 AM  Result Value Ref Range   TSH 4.502 (H) 0.350 - 4.500 uIU/mL    Comment: Performed by a 3rd Generation assay with a functional sensitivity of <=0.01 uIU/mL.  Basic metabolic panel     Status: Abnormal   Collection Time: 04/09/17  3:31 AM  Result Value Ref Range   Sodium 139 135 - 145 mmol/L   Potassium 4.3 3.5 - 5.1 mmol/L   Chloride 107 101 - 111 mmol/L   CO2 21 (L) 22 -  32 mmol/L   Glucose, Bld 152 (H) 65 - 99 mg/dL   BUN 19 6 - 20 mg/dL   Creatinine, Ser 0.98 0.44 - 1.00 mg/dL   Calcium 8.7 (L) 8.9 - 10.3 mg/dL   GFR calc non Af Amer 58 (L) >60 mL/min   GFR calc Af Amer >60 >60 mL/min    Comment: (NOTE) The eGFR has been calculated using the CKD EPI equation. This calculation has not been validated in all clinical situations. eGFR's persistently <60 mL/min signify possible Chronic Kidney Disease.    Anion gap 11 5 - 15  Hepatic function panel     Status: Abnormal   Collection Time: 04/09/17  3:31 AM  Result Value Ref Range   Total Protein 6.3 (L) 6.5 - 8.1 g/dL   Albumin 4.1 3.5 - 5.0 g/dL   AST 26 15 - 41 U/L   ALT 23 14 - 54 U/L   Alkaline Phosphatase 105 38 - 126 U/L   Total Bilirubin 0.8 0.3 - 1.2 mg/dL   Bilirubin, Direct 0.2 0.1 - 0.5 mg/dL   Indirect Bilirubin 0.6 0.3 - 0.9 mg/dL  Magnesium     Status: Abnormal   Collection Time: 04/09/17  3:31 AM  Result Value Ref Range   Magnesium 1.0  (L) 1.7 - 2.4 mg/dL  Lipid panel     Status: Abnormal   Collection Time: 04/09/17  3:31 AM  Result Value Ref Range   Cholesterol 153 0 - 200 mg/dL   Triglycerides 122 <150 mg/dL   HDL 31 (L) >40 mg/dL   Total CHOL/HDL Ratio 4.9 RATIO   VLDL 24 0 - 40 mg/dL   LDL Cholesterol 98 0 - 99 mg/dL    Comment:        Total Cholesterol/HDL:CHD Risk Coronary Heart Disease Risk Table                     Men   Women  1/2 Average Risk   3.4   3.3  Average Risk       5.0   4.4  2 X Average Risk   9.6   7.1  3 X Average Risk  23.4   11.0        Use the calculated Patient Ratio above and the CHD Risk Table to determine the patient's CHD Risk.        ATP III CLASSIFICATION (LDL):  <100     mg/dL   Optimal  100-129  mg/dL   Near or Above                    Optimal  130-159  mg/dL   Borderline  160-189  mg/dL   High  >190     mg/dL   Very High   Basic metabolic panel     Status: Abnormal   Collection Time: 04/10/17  2:35 AM  Result Value Ref Range   Sodium 139 135 - 145 mmol/L   Potassium 4.1 3.5 - 5.1 mmol/L   Chloride 105 101 - 111 mmol/L   CO2 23 22 - 32 mmol/L   Glucose, Bld 161 (H) 65 - 99 mg/dL   BUN 18 6 - 20 mg/dL   Creatinine, Ser 0.94 0.44 - 1.00 mg/dL   Calcium 8.7 (L) 8.9 - 10.3 mg/dL   GFR calc non Af Amer >60 >60 mL/min   GFR calc Af Amer >60 >60 mL/min    Comment: (NOTE) The eGFR has been calculated using  the CKD EPI equation. This calculation has not been validated in all clinical situations. eGFR's persistently <60 mL/min signify possible Chronic Kidney Disease.    Anion gap 11 5 - 15  Magnesium     Status: None   Collection Time: 04/10/17  2:35 AM  Result Value Ref Range   Magnesium 2.4 1.7 - 2.4 mg/dL    ECG   N/A  Telemetry   Atrial fibrillation with controlled ventricular response - Personally Reviewed  Radiology    No results found.  Cardiac Studies   N/A  Assessment   1. Principal Problem: 2.   Acute on chronic systolic heart failure  ([place]) 3. Active Problems: 4.   Hypothyroidism 5.   Type 2 diabetes mellitus with other specified complication (HCC) 6.   Chest pain 7.   Abnormal stress test 8.   Plan   1. Mr. Hegner diuresed additionally overnight. She remains in rate-controlled A. fib. She is scheduled for cardioversion on Tuesday morning with Dr. Haroldine Laws. We'll switch to oral amiodarone today. Continue IV twice a day diuresis today and consider transition to oral diuretics possibly tomorrow or if creatinine starts to increase.  Time Spent Directly with Patient:  I have spent a total of 15 minutes with the patient reviewing hospital notes, telemetry, EKGs, labs and examining the patient as well as establishing an assessment and plan that was discussed personally with the patient. > 50% of time was spent in direct patient care.  Length of Stay:  LOS: 1 day   Pixie Casino, MD, [place]  Attending Cardiologist  Direct Dial: [phone]  Fax: [phone]  Website:  [email or web address] Garramone 04/10/2017, 11:57 AM

## 2017-04-11 DIAGNOSIS — I480 Paroxysmal atrial fibrillation: Secondary | ICD-10-CM

## 2017-04-11 LAB — BASIC METABOLIC PANEL
ANION GAP: 11 (ref 5–15)
BUN: 16 mg/dL (ref 6–20)
CO2: 24 mmol/L (ref 22–32)
Calcium: 9.1 mg/dL (ref 8.9–10.3)
Chloride: 102 mmol/L (ref 101–111)
Creatinine, Ser: 0.98 mg/dL (ref 0.44–1.00)
GFR, EST NON AFRICAN AMERICAN: 58 mL/min — AB (ref >60)
GLUCOSE: 135 mg/dL — AB (ref 65–99)
POTASSIUM: 4.6 mmol/L (ref 3.5–5.1)
Sodium: 137 mmol/L (ref 135–145)

## 2017-04-11 MED ORDER — AMIODARONE IV BOLUS ONLY 150 MG/100ML
150.0000 mg | Freq: Once | INTRAVENOUS | Status: AC
  Administered 2017-04-11: 150 mg via INTRAVENOUS
  Filled 2017-04-11: qty 100

## 2017-04-11 MED ORDER — AMIODARONE HCL 200 MG PO TABS
200.0000 mg | ORAL_TABLET | Freq: Two times a day (BID) | ORAL | Status: DC
  Administered 2017-04-12 – 2017-04-13 (×3): 200 mg via ORAL
  Filled 2017-04-11 (×3): qty 1

## 2017-04-11 MED ORDER — FUROSEMIDE 10 MG/ML IJ SOLN
80.0000 mg | Freq: Once | INTRAMUSCULAR | Status: AC
  Administered 2017-04-11: 80 mg via INTRAVENOUS
  Filled 2017-04-11: qty 8

## 2017-04-11 MED ORDER — POLYETHYLENE GLYCOL 3350 17 G PO PACK
17.0000 g | PACK | Freq: Every day | ORAL | Status: DC
  Administered 2017-04-11 – 2017-04-12 (×2): 17 g via ORAL
  Filled 2017-04-11 (×4): qty 1

## 2017-04-11 MED ORDER — AMIODARONE HCL 200 MG PO TABS: 400.0000 mg | ORAL_TABLET | Freq: Two times a day (BID) | ORAL | Status: DC

## 2017-04-11 MED ORDER — POTASSIUM CHLORIDE CRYS ER 20 MEQ PO TBCR
20.0000 meq | EXTENDED_RELEASE_TABLET | Freq: Two times a day (BID) | ORAL | Status: DC
  Administered 2017-04-12 – 2017-04-13 (×3): 20 meq via ORAL
  Filled 2017-04-11 (×4): qty 1

## 2017-04-11 MED ORDER — METOLAZONE 5 MG PO TABS
5.0000 mg | ORAL_TABLET | Freq: Once | ORAL | Status: AC
  Administered 2017-04-11: 5 mg via ORAL
  Filled 2017-04-11: qty 1

## 2017-04-11 MED ORDER — SPIRONOLACTONE 25 MG PO TABS
12.5000 mg | ORAL_TABLET | Freq: Every day | ORAL | Status: DC
  Administered 2017-04-11 – 2017-04-13 (×3): 12.5 mg via ORAL
  Filled 2017-04-11 (×3): qty 1

## 2017-04-11 MED ORDER — FUROSEMIDE 10 MG/ML IJ SOLN
80.0000 mg | Freq: Three times a day (TID) | INTRAMUSCULAR | Status: DC
  Administered 2017-04-11: 80 mg via INTRAVENOUS
  Filled 2017-04-11: qty 8

## 2017-04-11 MED ORDER — PROMETHAZINE HCL 25 MG/ML IJ SOLN
6.2500 mg | Freq: Four times a day (QID) | INTRAMUSCULAR | Status: DC | PRN
  Administered 2017-04-11: 6.25 mg via INTRAVENOUS
  Filled 2017-04-11: qty 1

## 2017-04-11 MED FILL — Lidocaine HCl Local Inj 2%: INTRAMUSCULAR | Qty: 10 | Status: AC

## 2017-04-11 NOTE — Progress Notes (Signed)
CARDIAC REHAB PHASE I   PRE:  Rate/Rhythm: 115 afib    BP: sitting 148/79    SaO2: 98 RA  MODE:  Ambulation: 390 ft   POST:  Rate/Rhythm: 125 afib    BP: sitting 159/100     SaO2: 96 RA   Pt eager to walk and learn. Able to walk without RW, steady. Increased distance but did need x3 rest stops due to SOB. HR in 120s afib. Return to recliner and elevated feet. Discussed HF booklet in depth. She is very receptive. Understands low sodium as she has been trying to keep to 1500 mg daily. Will refer to [facility] CRPII. Gave videos to watch on HF and Afib. Encouraged more walking in hospital. [phone]   Harriet Masson CES, ACSM 04/11/2017 11:32 AM

## 2017-04-11 NOTE — Progress Notes (Signed)
Progress Note  Patient Name: [name] Date of Encounter: 04/11/2017  Primary Cardiologist: Dr Diona Browner  Subjective   Still short of breath but breathing improved. Did not require oxygen overnight. No chest pain. Feels constipated - took miralax this am.   Inpatient Medications    Scheduled Meds: . amiodarone  400 mg Oral Daily  . apixaban  5 mg Oral BID  . aspirin EC  81 mg Oral Daily  . fluticasone  1 spray Each Nare Daily  . furosemide  80 mg Intravenous BID  . levothyroxine  175 mcg Oral QAC breakfast  . loratadine  10 mg Oral Daily  . magnesium oxide  400 mg Oral BID  . neomycin-bacitracin-polymyxin   Topical Daily  . pantoprazole  40 mg Oral Daily  . polyethylene glycol  17 g Oral Daily  . potassium chloride  40 mEq Oral BID  . sodium chloride flush  3 mL Intravenous Q12H  . sodium chloride flush  3 mL Intravenous Q12H  . traZODone  100 mg Oral QHS   Continuous Infusions: . sodium chloride    . sodium chloride     PRN Meds: sodium chloride, acetaminophen, albuterol, ondansetron (ZOFRAN) IV, sodium chloride flush, sodium chloride flush   Vital Signs    Vitals:   04/10/17 2337 04/11/17 0000 04/11/17 0400 04/11/17 0852  BP: 102/68  126/86 114/75  Pulse: 82 83  93  Resp: (!) (!) 43  Temp: 97.7 F (36.5 C)  97.7 F (36.5 C) 97.9 F (36.6 C)  TempSrc: Oral  Oral Oral  SpO2: 98% 98% 99% 93%  Weight:   178 lb 9.6 oz (81 kg)   Height:        Intake/Output Summary (Last 24 hours) at 04/11/17 1031 Last data filed at 04/11/17 0500  Gross per 24 hour  Intake              480 ml  Output             2040 ml  Net            -1560 ml   Filed Weights   04/08/17 0455 04/09/17 0500 04/11/17 0400  Weight: 182 lb 14.4 oz (83 kg) 180 lb 1.6 oz (81.7 kg) 178 lb 9.6 oz (81 kg)    Telemetry    Atrial fibrillation HR 105 bpm - Personally Reviewed   Physical Exam  Pleasant woman in NAD, sitting up in chair GEN: No acute distress.   Neck: moderate  JVD Cardiac: irregularly irregular, no murmurs, rubs, or gallops.  Respiratory: Clear to auscultation bilaterally. GI: Soft, nontender, mildly distended  MS: 2+ bilateral pretibial edema; No deformity. Neuro:  Nonfocal  Psych: Normal affect   Labs    Chemistry Recent Labs Lab 04/09/17 0331 04/10/17 0235 04/11/17 0622  NA 139 139 137  K 4.3 4.1 4.6  CL 107 105 102  CO2 21* 23 24  GLUCOSE 152* 161* 135*  BUN CREATININE 0.98 0.94 0.98  CALCIUM 8.7* 8.7* 9.1  PROT 6.3*  --   --   ALBUMIN 4.1  --   --   AST 26  --   --   ALT 23  --   --   ALKPHOS 105  --   --   BILITOT 0.8  --   --   GFRNONAA 58* >60 58*  GFRAA >60 >60 >60  ANIONGAP Hematology Recent  Labs Lab 04/07/17 0052  WBC 8.9  RBC 3.99  HGB 11.2*  HCT 34.6*  MCV 86.7  MCH 28.1  MCHC 32.4  RDW 16.0*  PLT 210    Cardiac EnzymesNo results for input(s): TROPONINI in the last 168 hours.  Recent Labs Lab 04/07/17 0056  TROPIPOC 0.00     BNPNo results for input(s): BNP, PROBNP in the last 168 hours.   DDimer No results for input(s): DDIMER in the last 168 hours.   Radiology    No results found.  Cardiac Studies   Cardiac Cath: Conclusion     Ost LM to LM lesion, 25 %stenosed.  Prox Cx lesion, 25 %stenosed.  2nd Mrg lesion, 99 %stenosed. SVG to OM is occluded.  Ost 2nd Mrg to 2nd Mrg lesion, 75 %stenosed.  Mid Cx lesion, 25 %stenosed.  Prox RCA lesion, 80 %stenosed.  Dist RCA lesion, 100 %stenosed. SVG to PLA is patent.  Mid LAD lesion, 100 %stenosed. LIMA to Lad is occluded  LV end diastolic pressure is moderately elevated.  There is no aortic valve stenosis.  Hemodynamic findings consistent with severe pulmonary hypertension.  Ao sat: 95%, PA sat 61%, CO 4.7 L/min; CI 2.57   Severe three vessel CAD.  1/3 grafts patent.  Severe pulmonary hypertension.   Plan diuresis and management from the heart failure team.  EF known to be severely  reduced.  Could consider PCI of the RCA that feeds the PDA.  I think her DOE is related to her pulmonary HTN, volume overload rather than her RCA stenosis.     Indications   Abnormal stress test [R94.39 (ICD-10-CM)]  Procedural Details/Technique   Technical Details The risks, benefits, and details of the procedure were explained to the patient. The patient verbalized understanding and wanted to proceed. Informed written consent was obtained.  PROCEDURE TECHNIQUE: After Xylocaine anesthesia, a 7 French sheath was placed in the right common femoral vein. A 7 French balloontipped Swan-Ganz catheter was advanced to the pulmonary artery under fluoroscopic guidance. Access was obtained using ultrasound guidance. Hemodynamic pressures were obtained. Oxygen saturations were obtained. After Xylocaine anesthesia, a 51F sheath was placed in the right femoral artery with a single anterior needle wall stick. Left coronary angiography was done using a Judkins L4 guide catheter. Right coronary angiography was done using a Judkins R4 guide catheter. Left heart cath was done using a pigtail catheter.     Contrast: 95 cc     Estimated blood loss <50 mL.  During this procedure the patient was administered the following to achieve and maintain moderate conscious sedation: Versed 1 mg, Fentanyl 25 mcg, while the patient's heart rate, blood pressure, and oxygen saturation were continuously monitored. The period of conscious sedation was 45 minutes, of which I was present face-to-face 100% of this time.    Complications   Complications documented before study signed (04/08/2017 9:12 AM EDT)    No complications were associated with this study.  Documented by Corky Crafts, MD - 04/08/2017 9:11 AM EDT    Coronary Findings   Dominance: Right  Left Main  Ost LM to LM lesion, 25% stenosed.  Left Anterior Descending  Ost LAD to Prox LAD lesion, 25% stenosed.  Mid LAD lesion, 100% stenosed.  Left  Circumflex  Prox Cx lesion, 25% stenosed.  Mid Cx lesion, 25% stenosed.  First Obtuse Marginal Branch  Vessel is small in size.  Second Obtuse Marginal Branch  Ost 2nd Mrg to 2nd Mrg lesion, 75% stenosed.  2nd Mrg lesion, 99% stenosed.  Right Coronary Artery  Prox RCA lesion, 80% stenosed.  Dist RCA lesion, 100% stenosed.  Graft Angiography  saphenous Graft to 2nd Mrg  SVG.  Prox Graft lesion, 100% stenosed.  saphenous Graft to Dist RCA  SVG.  LIMA Graft to Dist LAD  Prox Graft lesion, 100% stenosed.  Right Heart   Right Heart Pressures Hemodynamic findings consistent with severe pulmonary hypertension. Elevated LV EDP consistent with volume overload. Ao sat: 95%, PA sat 61%, CO 4.7 L/min; CI 2.57    Left Heart   Left Ventricle LV end diastolic pressure is moderately elevated.    Aortic Valve There is no aortic valve stenosis.    Coronary Diagrams   Diagnostic Diagram          Patient Profile     71 y.o. female with advanced ischemic cardiomyopathy, acute on chronic systolic heart failure, and recurrent persistent AF  Assessment & Plan    1. Acute on chronic systolic heart failure: pt has diuresed well and is 7.6L negative since admission (3L negative yesterday). Improved but still volume overloaded on exam. Continue IV lasix 80 mg BID. Add aldactone. Reduce K-Dur. Avoid beta-blocker with persistent volume overload. Has not tolerated ARB in past - would consider trial of entresto once aggressive diuresis is finished and she is transitioned to oral diuretics.   2. Persistent atrial fibrillation: on IV amiodarone, anticoagulated with apixaban, plans for TEE cardioversion Tuesday with Dr Gala Romney.  3. HTN: BP controlled.   4. Type II DM: CBG's controlled (124-167)  5. CAD With severe ischemic cardiomyopathy: unfortunately pt has no targets for revascularization after CABG with multiple grafts down. Continue low-dose ASA 81 mg.   For questions or updates, please  contact [facility] Please consult [email or web address] for contact info under Cardiology/STEMI.   Enzo Bi, MD  04/11/2017, 10:31 AM

## 2017-04-11 NOTE — Progress Notes (Signed)
Advanced Heart Failure Rounding Note  PCP: Dr. Leandrew Koyanagi Primary Cardiologist: Dr. Diona Browner HF: Dr. Gala Romney   Subjective:    Feels weak and nauseated. Denies lightheadedness or dizziness. Had a runny nose over the weekend. IV amio switched to po. Breathing better but weight no changed much.   Negative 7.6 L this admission. Weight shows only down 2 lbs total.   Creatinine stable at 0.98. K 4.6.  Objective:   Weight Range: 178 lb 9.6 oz (81 kg) Body mass index is 31.64 kg/m.   Vital Signs:   Temp:  [97.7 F (36.5 C)-98.4 F (36.9 C)] 97.9 F (36.6 C) (10/15 0852) Pulse Rate:  [82-94] 93 (10/15 0852) Resp:  [16-43] 43 (10/15 0852) BP: (102-126)/(68-86) 114/75 (10/15 0852) SpO2:  [91 %-99 %] 93 % (10/15 0852) Weight:  [178 lb 9.6 oz (81 kg)] 178 lb 9.6 oz (81 kg) (10/15 0400) Last BM Date: 04/06/17  Weight change: Filed Weights   04/08/17 0455 04/09/17 0500 04/11/17 0400  Weight: 182 lb 14.4 oz (83 kg) 180 lb 1.6 oz (81.7 kg) 178 lb 9.6 oz (81 kg)    Intake/Output:   Intake/Output Summary (Last 24 hours) at 04/11/17 1046 Last data filed at 04/11/17 0500  Gross per 24 hour  Intake              480 ml  Output             2040 ml  Net            -1560 ml      Physical Exam    General:  Elderly. Sitting on side of bed . No resp difficulty HEENT: Normal Neck: Supple. JVP 10 cm+. Carotids 2+ bilat; no bruits. No lymphadenopathy or thyromegaly appreciated. Cor: PMI nondisplaced. Irregularly irregular, slightly tachy.  2/6 SEM RSB  Lungs: Slightly diminished basilar sounds.  Abdomen: Soft, nontender, nondistended. No hepatosplenomegaly. No bruits or masses. Good bowel sounds. Extremities: No cyanosis, clubbing, or rash. 1+ ankle edema.  Neuro: Alert & orientedx3, cranial nerves grossly intact. moves all 4 extremities w/o difficulty. Affect pleasant  Telemetry   Afib 90-110s personally reviewed  EKG    Afib on admit.   Labs    CBC No results for input(s):  WBC, NEUTROABS, HGB, HCT, MCV, PLT in the last 72 hours. Basic Metabolic Panel  Recent Labs  04/09/17 0331 04/10/17 0235 04/11/17 0622  NA 139 139 137  K 4.3 4.1 4.6  CL 107 105 102  CO2 21* 23 24  GLUCOSE 152* 161* 135*  BUN CREATININE 0.98 0.94 0.98  CALCIUM 8.7* 8.7* 9.1  MG 1.0* 2.4  --    Liver Function Tests  Recent Labs  04/09/17 0331  AST 26  ALT 23  ALKPHOS 105  BILITOT 0.8  PROT 6.3*  ALBUMIN 4.1   No results for input(s): LIPASE, AMYLASE in the last 72 hours. Cardiac Enzymes No results for input(s): CKTOTAL, CKMB, CKMBINDEX, TROPONINI in the last 72 hours.  BNP: BNP (last 3 results)  Recent Labs  12/24/16 0824  BNP 385.0*    ProBNP (last 3 results) No results for input(s): PROBNP in the last 8760 hours.   D-Dimer No results for input(s): DDIMER in the last 72 hours. Hemoglobin A1C No results for input(s): HGBA1C in the last 72 hours. Fasting Lipid Panel  Recent Labs  04/09/17 0331  CHOL 153  HDL 31*  LDLCALC 98  TRIG 875  CHOLHDL 4.9  Thyroid Function Tests  Recent Labs  04/09/17 0331  TSH 4.502*    Other results:   Imaging     No results found.   Medications:     Scheduled Medications: . amiodarone  400 mg Oral Daily  . apixaban  5 mg Oral BID  . aspirin EC  81 mg Oral Daily  . fluticasone  1 spray Each Nare Daily  . furosemide  80 mg Intravenous BID  . levothyroxine  175 mcg Oral QAC breakfast  . loratadine  10 mg Oral Daily  . magnesium oxide  400 mg Oral BID  . neomycin-bacitracin-polymyxin   Topical Daily  . pantoprazole  40 mg Oral Daily  . polyethylene glycol  17 g Oral Daily  . potassium chloride  40 mEq Oral BID  . sodium chloride flush  3 mL Intravenous Q12H  . sodium chloride flush  3 mL Intravenous Q12H  . traZODone  100 mg Oral QHS     Infusions: . sodium chloride    . sodium chloride       PRN Medications:  sodium chloride, acetaminophen, albuterol, ondansetron (ZOFRAN)  IV, sodium chloride flush, sodium chloride flush    Patient Profile   [name] is a 71 year old with history of CAD, CABG 2003, PAF, hypothyroidism, and chronic systolic heart failure admitted with increased dyspnea/chest pain.     Found to be back in Afib.   Assessment/Plan   1. Dyspnea- In the setting of volume overload and recurrent Afib. ? COPD component. PFTs obtained earlier this month. No obstruction noted.   - Goals are diuresis and restoration of NSR as below. 2. CAD- CABG in 2003. S/P LHC 1/3 grafts patent. Per Dr Jobie Quaker could consider PCI to RCA. Will ask Dr Gala Romney to review.  - Continue aspirin 81 mg daily.  - Total Cholesterol 153, HDL 31, LDL 98. TGs 122. Not on statin due to cramping.  - Declined zetia.  3. A/C Systolic/Diastolic Heart Failure - ECHO 08/2438 EF 25-30% and has been low for years. Refused ICD. Can revisit later.   - Volume status remains elevated and only modest diuresis over weekend.  - Increase lasix to 80 mg IV TID and add metolazone 5 mg once.  - Agree with addition of spiro 12.5 mg daily. K cut back.  - No BB for now with relatively soft pressures.  - Intolerant losartan due to dyspnea in the past ? If this was related volume overload. Consider entresto later.  4. PAF - had successful cardioversion 2016 in [place].  - Plan for DCCV tomorrow. Amiodarone switched to po by Gen cards 04/10/17. Increase to 400 mg BID.  - Continue eliquis 5 mg BID. No bleeding.  5. Hypothyroidsim - on synthroid. TSH 4.502 on admit (UNL normal 4.500) 6. Obesity:  - Body mass index is 31.64 kg/m.  7. Suspected OSA - Will need outpatient sleep study.    Length of Stay: 2  [name]  04/11/2017, 10:46 AM  Advanced Heart Failure Team Pager [phone] (M-F; 7a - 4p)  Please contact [facility] for night-coverage after hours (4p -7a ) and weekends on [email or web address]    Patient seen and examined with the above-signed Advanced Practice Provider  and/or Housestaff. I personally reviewed laboratory data, imaging studies and relevant notes. I independently examined the patient and formulated the important aspects of the plan. I have edited the note to reflect any of my changes or salient points. I have personally discussed the plan  with the patient and/or family.  She is tenuous but somewhat improved. Remains in AF with RVR. IV amio switched to po and now having nausea. Will decrease dose to 200 bid. Give one more IV amio bolus to help with load prior to TEE and DC-CV in am. Continue Eliquis. Remains significantly volume overloaded. Will increase lasix and give a dose of metolazone. Watch renal function and electrolytes carefully.   '[name] Pasko, MD  10:40 PM

## 2017-04-12 ENCOUNTER — Ambulatory Visit: Payer: Medicare Other | Admitting: Cardiology

## 2017-04-12 ENCOUNTER — Encounter (HOSPITAL_COMMUNITY)
Admission: EM | Disposition: A | Discharge status: Home / Self Care | Payer: Self-pay | Source: Home / Self Care | Attending: Cardiovascular Disease

## 2017-04-12 ENCOUNTER — Inpatient Hospital Stay (HOSPITAL_COMMUNITY): Payer: Medicare Other | Admitting: Certified Registered"

## 2017-04-12 ENCOUNTER — Inpatient Hospital Stay (HOSPITAL_COMMUNITY): Payer: Medicare Other

## 2017-04-12 ENCOUNTER — Encounter (HOSPITAL_COMMUNITY): Payer: Self-pay | Admitting: Certified Registered"

## 2017-04-12 DIAGNOSIS — I340 Nonrheumatic mitral (valve) insufficiency: Secondary | ICD-10-CM

## 2017-04-12 DIAGNOSIS — I4891 Unspecified atrial fibrillation: Secondary | ICD-10-CM

## 2017-04-12 HISTORY — PX: TEE WITHOUT CARDIOVERSION: SHX5443

## 2017-04-12 HISTORY — PX: CARDIOVERSION: SHX1299

## 2017-04-12 LAB — BASIC METABOLIC PANEL
Anion gap: 11 (ref 5–15)
BUN: 16 mg/dL (ref 6–20)
CALCIUM: 9.4 mg/dL (ref 8.9–10.3)
CO2: 27 mmol/L (ref 22–32)
CREATININE: 1.24 mg/dL — AB (ref 0.44–1.00)
Chloride: 100 mmol/L — ABNORMAL LOW (ref 101–111)
GFR calc non Af Amer: 43 mL/min — ABNORMAL LOW (ref >60)
GFR, EST AFRICAN AMERICAN: 50 mL/min — AB (ref >60)
Glucose, Bld: 149 mg/dL — ABNORMAL HIGH (ref 65–99)
Potassium: 4.4 mmol/L (ref 3.5–5.1)
SODIUM: 138 mmol/L (ref 135–145)

## 2017-04-12 LAB — MAGNESIUM: MAGNESIUM: 2.1 mg/dL (ref 1.7–2.4)

## 2017-04-12 SURGERY — ECHOCARDIOGRAM, TRANSESOPHAGEAL
Anesthesia: General

## 2017-04-12 MED ORDER — BUTAMBEN-TETRACAINE-BENZOCAINE 2-2-14 % EX AERO
INHALATION_SPRAY | CUTANEOUS | Status: DC | PRN
  Administered 2017-04-12: 1 via TOPICAL
  Administered 2017-04-12: 2 via TOPICAL

## 2017-04-12 MED ORDER — PROPOFOL 500 MG/50ML IV EMUL
INTRAVENOUS | Status: DC | PRN
  Administered 2017-04-12: 75 ug/kg/min via INTRAVENOUS

## 2017-04-12 MED ORDER — PHENYLEPHRINE 40 MCG/ML (10ML) SYRINGE FOR IV PUSH (FOR BLOOD PRESSURE SUPPORT)
PREFILLED_SYRINGE | INTRAVENOUS | Status: DC | PRN
  Administered 2017-04-12 (×2): 80 ug via INTRAVENOUS

## 2017-04-12 MED ORDER — DOCUSATE SODIUM 100 MG PO CAPS
100.0000 mg | ORAL_CAPSULE | Freq: Two times a day (BID) | ORAL | Status: DC
  Administered 2017-04-12 (×2): 100 mg via ORAL
  Filled 2017-04-12 (×3): qty 1

## 2017-04-12 MED ORDER — SENNA 8.6 MG PO TABS
2.0000 | ORAL_TABLET | Freq: Every day | ORAL | Status: DC
  Administered 2017-04-12: 17.2 mg via ORAL
  Filled 2017-04-12 (×2): qty 2

## 2017-04-12 MED ORDER — TORSEMIDE 20 MG PO TABS
40.0000 mg | ORAL_TABLET | Freq: Two times a day (BID) | ORAL | Status: DC
  Administered 2017-04-12 – 2017-04-13 (×3): 40 mg via ORAL
  Filled 2017-04-12 (×3): qty 2

## 2017-04-12 MED ORDER — LACTULOSE 10 GM/15ML PO SOLN
10.0000 g | Freq: Every day | ORAL | Status: DC | PRN
  Administered 2017-04-12: 10 g via ORAL
  Filled 2017-04-12: qty 15

## 2017-04-12 MED ORDER — LACTATED RINGERS IV SOLN
INTRAVENOUS | Status: DC
Start: 2017-04-12 — End: 2017-04-12
  Administered 2017-04-12: 1000 mL via INTRAVENOUS

## 2017-04-12 MED ORDER — PROPOFOL 10 MG/ML IV BOLUS
INTRAVENOUS | Status: DC | PRN
  Administered 2017-04-12: 10 mg via INTRAVENOUS
  Administered 2017-04-12 (×2): 20 mg via INTRAVENOUS
  Administered 2017-04-12: 10 mg via INTRAVENOUS

## 2017-04-12 MED ORDER — LACTATED RINGERS IV SOLN
INTRAVENOUS | Status: DC | PRN
  Administered 2017-04-12: 08:00:00 via INTRAVENOUS

## 2017-04-12 NOTE — Progress Notes (Signed)
CARDIAC REHAB PHASE I   PRE:  Rate/Rhythm: 61 SR    BP: sitting 123/66    SaO2: 99 2L, 98 RA  MODE:  Ambulation: 290 ft   POST:  Rate/Rhythm: 83 SR    BP: sitting 128/88     SaO2: 91-93 RA after BR  Pt groggy after DCCV. Able to ambulate but rest x1 for SOB. To BR after walk with small amount of stool incontinence. Pt continues to c/o grogginess. To recliner with feet elevated. SaO2 low then up. We should check it with walking later. Family present. [phone]   Harriet Masson CES, ACSM 04/12/2017 11:46 AM

## 2017-04-12 NOTE — Anesthesia Preprocedure Evaluation (Addendum)
Anesthesia Evaluation  Patient identified by MRN, date of birth, ID band Patient awake    Reviewed: Allergy & Precautions, H&P , NPO status , Patient's Chart, lab work & pertinent test results, reviewed documented beta blocker date and time   Airway Mallampati: II  TM Distance: >3 FB Neck ROM: Full    Dental no notable dental hx. (+) Edentulous Upper, Edentulous Lower, Dental Advisory Given   Pulmonary asthma , former smoker,    Pulmonary exam normal breath sounds clear to auscultation       Cardiovascular hypertension, Pt. on medications and Pt. on home beta blockers + CAD, + Past MI and +CHF   Rhythm:Irregular Rate:Normal     Neuro/Psych negative neurological ROS  negative psych ROS   GI/Hepatic Neg liver ROS, GERD  Medicated and Controlled,  Endo/Other  diabetesHypothyroidism   Renal/GU Renal InsufficiencyRenal disease  negative genitourinary   Musculoskeletal   Abdominal   Peds  Hematology negative hematology ROS (+)   Anesthesia Other Findings   Reproductive/Obstetrics negative OB ROS                            Anesthesia Physical Anesthesia Plan  ASA: III  Anesthesia Plan: General   Post-op Pain Management:    Induction: Intravenous  PONV Risk Score and Plan: 3 and Propofol infusion and Treatment may vary due to age or medical condition  Airway Management Planned: Nasal Cannula  Additional Equipment:   Intra-op Plan:   Post-operative Plan:   Informed Consent: I have reviewed the patients History and Physical, chart, labs and discussed the procedure including the risks, benefits and alternatives for the proposed anesthesia with the patient or authorized representative who has indicated his/her understanding and acceptance.   Dental advisory given  Plan Discussed with: CRNA  Anesthesia Plan Comments:        Anesthesia Quick Evaluation

## 2017-04-12 NOTE — CV Procedure (Signed)
   TRANSESOPHAGEAL ECHOCARDIOGRAM GUIDED DIRECT CURRENT CARDIOVERSION  NAME:  COURTNIE BRENES   MRN: [id number] DOB:  05-01-1946   ADMIT DATE: 04/07/2017  INDICATIONS:  PAF   PROCEDURE:   Informed consent was obtained prior to the procedure. The risks, benefits and alternatives for the procedure were discussed and the patient comprehended these risks.  Risks include, but are not limited to, cough, sore throat, vomiting, nausea, somnolence, esophageal and stomach trauma or perforation, bleeding, low blood pressure, aspiration, pneumonia, infection, trauma to the teeth and death.    After a procedural time-out, the oropharynx was anesthetized and the patient was sedated by the anesthesia service. The transesophageal probe was inserted in the esophagus and stomach without difficulty and multiple views were obtained.   FINDINGS:  LEFT VENTRICLE: EF = 20-25% diffuse HK   RIGHT VENTRICLE: Dilated severely hypokinetic.   LEFT ATRIUM: Moderately dilated 4.8cm diameter  LEFT ATRIAL APPENDAGE: No clot  RIGHT ATRIUM: Severely dilated  AORTIC VALVE:  Trileaflet. Mildly calcified. No significant AI/AS   MITRAL VALVE:   Normal . Mild MR  TRICUSPID VALVE: Normal Moderate TR  PULMONIC VALVE: Not well seen. Trivial PI   INTERATRIAL SEPTUM: Bows right to left. No PFO or ASD  PERICARDIUM: No effusion   DESCENDING AORTA: Mild to moderate plaque   CARDIOVERSION:     Indications:  Atrial Fibrillation  Procedure Details:  Once the TEE was complete, the patient had the defibrillator pads placed in the anterior and posterior position. Once an appropriate level of sedation was achieved, the patient received a single biphasic, synchronized 200J shock with manual pressure held on anterior pad. There was with prompt conversion to sinus rhythm. No apparent complications.   [name], MD  8:33 AM

## 2017-04-12 NOTE — Interval H&P Note (Signed)
History and Physical Interval Note:  04/12/2017 6:53 AM  [name]  has presented today for surgery, with the diagnosis of A-FIB  The various methods of treatment have been discussed with the patient and family. After consideration of risks, benefits and other options for treatment, the patient has consented to  Procedure(s): TRANSESOPHAGEAL ECHOCARDIOGRAM (TEE) (N/A) CARDIOVERSION (N/A) as a surgical intervention .  The patient's history has been reviewed, patient examined, no change in status, stable for surgery.  I have reviewed the patient's chart and labs.  Questions were answered to the patient's satisfaction.     Bensimhon, Reuel Boom

## 2017-04-12 NOTE — Progress Notes (Signed)
Advanced Heart Failure Rounding Note  PCP: Dr. Leandrew Koyanagi Primary Cardiologist: Dr. Diona Browner HF: Dr. Gala Romney   Subjective:    Lasix increased yesterday. Out 2.2L. WEight down 2 pounds. Breathing some better but still SOB with minimal activity. Remains in AF 90-100.  Creatinine 0.98-> 1.2  Objective:   Weight Range: 79.8 kg (176 lb) Body mass index is 31.18 kg/m.   Vital Signs:   Temp:  [97.9 F (36.6 C)-98.9 F (37.2 C)] 98.3 F (36.8 C) (10/16 0715) Pulse Rate:  [45-123] 100 (10/16 0715) Resp:  [12-43] 12 (10/16 0715) BP: (114-133)/(72-89) 119/85 (10/16 0715) SpO2:  [82 %-99 %] 97 % (10/16 0715) Weight:  [79.8 kg (176 lb)-80.2 kg (176 lb 12.9 oz)] 79.8 kg (176 lb) (10/16 0715) Last BM Date: 04/06/17  Weight change: Filed Weights   04/11/17 0400 04/12/17 0443 04/12/17 0715  Weight: 81 kg (178 lb 9.6 oz) 80.2 kg (176 lb 12.9 oz) 79.8 kg (176 lb)    Intake/Output:   Intake/Output Summary (Last 24 hours) at 04/12/17 0825 Last data filed at 04/12/17 0500  Gross per 24 hour  Intake              922 ml  Output             2200 ml  Net            -1278 ml      Physical Exam    General:  Chronically ill appearing/ Lying in bed No resp difficulty HEENT: normal Neck: supple. JVP 9 . Carotids 2+ bilat; no bruits. No lymphadenopathy or thryomegaly appreciated. Cor: PMI nondisplaced. IRR No rubs, gallops or murmurs. Lungs: decreased throughout.  Abdomen: obese soft, nontender, nondistended. No hepatosplenomegaly. No bruits or masses. Good bowel sounds. Extremities: no cyanosis, clubbing, rash, trace - 1+  edema Neuro: alert & orientedx3, cranial nerves grossly intact. moves all 4 extremities w/o difficulty. Affect pleasant   Telemetry   Afib 90-105s personally reviewed  EKG    Afib on admit.   Labs    CBC No results for input(s): WBC, NEUTROABS, HGB, HCT, MCV, PLT in the last 72 hours. Basic Metabolic Panel  Recent Labs  04/10/17 0235 04/11/17 0622  04/12/17 0319  NA 139 137 138  K 4.1 4.6 4.4  CL 105 102 100*  CO2 GLUCOSE 161* 135* 149*  BUN CREATININE 0.94 0.98 1.24*  CALCIUM 8.7* 9.1 9.4  MG 2.4  --  2.1   Liver Function Tests No results for input(s): AST, ALT, ALKPHOS, BILITOT, PROT, ALBUMIN in the last 72 hours. No results for input(s): LIPASE, AMYLASE in the last 72 hours. Cardiac Enzymes No results for input(s): CKTOTAL, CKMB, CKMBINDEX, TROPONINI in the last 72 hours.  BNP: BNP (last 3 results)  Recent Labs  12/24/16 0824  BNP 385.0*    ProBNP (last 3 results) No results for input(s): PROBNP in the last 8760 hours.   D-Dimer No results for input(s): DDIMER in the last 72 hours. Hemoglobin A1C No results for input(s): HGBA1C in the last 72 hours. Fasting Lipid Panel No results for input(s): CHOL, HDL, LDLCALC, TRIG, CHOLHDL, LDLDIRECT in the last 72 hours. Thyroid Function Tests No results for input(s): TSH, T4TOTAL, T3FREE, THYROIDAB in the last 72 hours.  Invalid input(s): FREET3  Other results:   Imaging    No results found.   Medications:     Scheduled Medications: . [MAR Hold] amiodarone  200 mg Oral  BID  . [MAR Hold] apixaban  5 mg Oral BID  . [MAR Hold] aspirin EC  81 mg Oral Daily  . [MAR Hold] docusate sodium  100 mg Oral BID  . [MAR Hold] fluticasone  1 spray Each Nare Daily  . [MAR Hold] furosemide  80 mg Intravenous TID  . [MAR Hold] levothyroxine  175 mcg Oral QAC breakfast  . [MAR Hold] loratadine  10 mg Oral Daily  . [MAR Hold] magnesium oxide  400 mg Oral BID  . [MAR Hold] neomycin-bacitracin-polymyxin   Topical Daily  . [MAR Hold] pantoprazole  40 mg Oral Daily  . [MAR Hold] polyethylene glycol  17 g Oral Daily  . [MAR Hold] potassium chloride  20 mEq Oral BID  . [MAR Hold] senna  2 tablet Oral Daily  . [MAR Hold] sodium chloride flush  3 mL Intravenous Q12H  . [MAR Hold] sodium chloride flush  3 mL Intravenous Q12H  . [MAR Hold] spironolactone   12.5 mg Oral Daily  . [MAR Hold] traZODone  100 mg Oral QHS    Infusions: . [MAR Hold] sodium chloride    . sodium chloride    . lactated ringers 1,000 mL (04/12/17 0715)    PRN Medications: [MAR Hold] sodium chloride, [MAR Hold] acetaminophen, [MAR Hold] albuterol, butamben-tetracaine-benzocaine, [MAR Hold] lactulose, [MAR Hold] ondansetron (ZOFRAN) IV, [MAR Hold] promethazine, [MAR Hold] sodium chloride flush, [MAR Hold] sodium chloride flush    Patient Profile   [name] is a 71 year old with history of CAD, CABG 2003, PAF, hypothyroidism, and chronic systolic heart failure admitted with increased dyspnea/chest pain.     Found to be back in Afib.   Assessment/Plan   1. A/C Systolic/Diastolic Heart Failure - ECHO 06/6107 EF 25-30% and has been low for years. Refused ICD. Can revisit later.   - Volume status improving slowly.  - Creatinine up today after increasing diuretics to lasix  80 mg IV TID and metolazone 5 mg once.  - Will switch to po torsemide. - Continue spiro as tolerated - No BB for now with relatively soft pressures.  - Consider losartan or Entresto tomorrow if BP stable has previously not tolerated losartan by report  - Watch closely for low output after DC-CV today 2. CAD- CABG in 2003. S/P LHC 10/18  1/3 grafts patent. No targets for intervention - No s.s ischemia currently  - Continue aspirin 81 mg daily.  - Total Cholesterol 153, HDL 31, LDL 98. TGs 122. Not on statin due to cramping.  - Declined zetia.  3. PAF - had successful cardioversion 2016 in [place].  - Plan for DCCV today, On amio 200 bid (unable to tolerate 400 bid due to nausea) - Continue eliquis 5 mg BID. No bleeding.  5. Hypothyroidsim - on synthroid. TSH 4.502 on admit (UNL normal 4.500) 6. Obesity:  - Body mass index is 31.18 kg/m.  7. Suspected OSA - Will need outpatient sleep study.    Length of Stay: 3  [name], MD  04/12/2017, 8:25 AM  Advanced Heart Failure  Team Pager [phone] (M-F; 7a - 4p)  Please contact [facility] for night-coverage after hours (4p -7a ) and weekends on [email or web address]

## 2017-04-12 NOTE — Transfer of Care (Signed)
Immediate Anesthesia Transfer of Care Note  Patient: [name]  Procedure(s) Performed: TRANSESOPHAGEAL ECHOCARDIOGRAM (TEE) (N/A ) CARDIOVERSION (N/A )  Patient Location: Endoscopy Unit  Anesthesia Type:General  Level of Consciousness: drowsy and patient cooperative  Airway & Oxygen Therapy: Patient Spontanous Breathing and Patient connected to nasal cannula oxygen  Post-op Assessment: Report given to RN, Post -op Vital signs reviewed and stable and Patient moving all extremities  Post vital signs: Reviewed and stable  Last Vitals:  Vitals:   04/12/17 0715 04/12/17 0834  BP: 119/85 (!) 114/98  Pulse: 100 64  Resp: 12 16  Temp: 36.8 C   SpO2: 97% 93%    Last Pain:  Vitals:   04/12/17 0715  TempSrc: Oral  PainSc:       Patients Stated Pain Goal: 0 (04/11/17 2200)  Complications: No apparent anesthesia complications

## 2017-04-12 NOTE — Anesthesia Postprocedure Evaluation (Signed)
Anesthesia Post Note  Patient: [name]  Procedure(s) Performed: TRANSESOPHAGEAL ECHOCARDIOGRAM (TEE) (N/A ) CARDIOVERSION (N/A )     Patient location during evaluation: PACU Anesthesia Type: General Level of consciousness: awake and alert Pain management: pain level controlled Vital Signs Assessment: post-procedure vital signs reviewed and stable Respiratory status: spontaneous breathing, nonlabored ventilation, respiratory function stable and patient connected to nasal cannula oxygen Cardiovascular status: stable and blood pressure returned to baseline Postop Assessment: no apparent nausea or vomiting Anesthetic complications: no    Last Vitals:  Vitals:   04/12/17 0840 04/12/17 0850  BP: 126/76 (!) 96/52  Pulse: 63 62  Resp: 20 16  Temp:    SpO2: 98% 95%    Last Pain:  Vitals:   04/12/17 0834  TempSrc: Oral  PainSc:                  Jeanne Diefendorf,W. EDMOND

## 2017-04-12 NOTE — H&P (View-Only) (Signed)
  Advanced Heart Failure Rounding Note  PCP: Dr. Burdine Primary Cardiologist: Dr. McDowell HF: Dr. Myran Arcia   Subjective:    Feels weak and nauseated. Denies lightheadedness or dizziness. Had a runny nose over the weekend. IV amio switched to po. Breathing better but weight no changed much.   Negative 7.6 L this admission. Weight shows only down 2 lbs total.   Creatinine stable at 0.98. K 4.6.  Objective:   Weight Range: 178 lb 9.6 oz (81 kg) Body mass index is 31.64 kg/m.   Vital Signs:   Temp:  [97.7 F (36.5 C)-98.4 F (36.9 C)] 97.9 F (36.6 C) (10/15 0852) Pulse Rate:  [82-94] 93 (10/15 0852) Resp:  [16-43] 43 (10/15 0852) BP: (102-126)/(68-86) 114/75 (10/15 0852) SpO2:  [91 %-99 %] 93 % (10/15 0852) Weight:  [178 lb 9.6 oz (81 kg)] 178 lb 9.6 oz (81 kg) (10/15 0400) Last BM Date: 04/06/17  Weight change: Filed Weights   04/08/17 0455 04/09/17 0500 04/11/17 0400  Weight: 182 lb 14.4 oz (83 kg) 180 lb 1.6 oz (81.7 kg) 178 lb 9.6 oz (81 kg)    Intake/Output:   Intake/Output Summary (Last 24 hours) at 04/11/17 1046 Last data filed at 04/11/17 0500  Gross per 24 hour  Intake              480 ml  Output             2040 ml  Net            -1560 ml      Physical Exam    General:  Elderly. Sitting on side of bed . No resp difficulty HEENT: Normal Neck: Supple. JVP 10 cm+. Carotids 2+ bilat; no bruits. No lymphadenopathy or thyromegaly appreciated. Cor: PMI nondisplaced. Irregularly irregular, slightly tachy.  2/6 SEM RSB  Lungs: Slightly diminished basilar sounds.  Abdomen: Soft, nontender, nondistended. No hepatosplenomegaly. No bruits or masses. Good bowel sounds. Extremities: No cyanosis, clubbing, or rash. 1+ ankle edema.  Neuro: Alert & orientedx3, cranial nerves grossly intact. moves all 4 extremities w/o difficulty. Affect pleasant  Telemetry   Afib 90-110s personally reviewed  EKG    Afib on admit.   Labs    CBC No results for input(s):  WBC, NEUTROABS, HGB, HCT, MCV, PLT in the last 72 hours. Basic Metabolic Panel  Recent Labs  04/09/17 0331 04/10/17 0235 04/11/17 0622  NA 139 139 137  K 4.3 4.1 4.6  CL 107 105 102  CO2 21* 23 24  GLUCOSE 152* 161* 135*  BUN 19 18 16  CREATININE 0.98 0.94 0.98  CALCIUM 8.7* 8.7* 9.1  MG 1.0* 2.4  --    Liver Function Tests  Recent Labs  04/09/17 0331  AST 26  ALT 23  ALKPHOS 105  BILITOT 0.8  PROT 6.3*  ALBUMIN 4.1   No results for input(s): LIPASE, AMYLASE in the last 72 hours. Cardiac Enzymes No results for input(s): CKTOTAL, CKMB, CKMBINDEX, TROPONINI in the last 72 hours.  BNP: BNP (last 3 results)  Recent Labs  12/24/16 0824  BNP 385.0*    ProBNP (last 3 results) No results for input(s): PROBNP in the last 8760 hours.   D-Dimer No results for input(s): DDIMER in the last 72 hours. Hemoglobin A1C No results for input(s): HGBA1C in the last 72 hours. Fasting Lipid Panel  Recent Labs  04/09/17 0331  CHOL 153  HDL 31*  LDLCALC 98  TRIG 122  CHOLHDL 4.9     Thyroid Function Tests  Recent Labs  04/09/17 0331  TSH 4.502*    Other results:   Imaging     No results found.   Medications:     Scheduled Medications: . amiodarone  400 mg Oral Daily  . apixaban  5 mg Oral BID  . aspirin EC  81 mg Oral Daily  . fluticasone  1 spray Each Nare Daily  . furosemide  80 mg Intravenous BID  . levothyroxine  175 mcg Oral QAC breakfast  . loratadine  10 mg Oral Daily  . magnesium oxide  400 mg Oral BID  . neomycin-bacitracin-polymyxin   Topical Daily  . pantoprazole  40 mg Oral Daily  . polyethylene glycol  17 g Oral Daily  . potassium chloride  40 mEq Oral BID  . sodium chloride flush  3 mL Intravenous Q12H  . sodium chloride flush  3 mL Intravenous Q12H  . traZODone  100 mg Oral QHS     Infusions: . sodium chloride    . sodium chloride       PRN Medications:  sodium chloride, acetaminophen, albuterol, ondansetron (ZOFRAN)  IV, sodium chloride flush, sodium chloride flush    Patient Profile   [name] is a 71 year old with history of CAD, CABG 2003, PAF, hypothyroidism, and chronic systolic heart failure admitted with increased dyspnea/chest pain.     Found to be back in Afib.   Assessment/Plan   1. Dyspnea- In the setting of volume overload and recurrent Afib. ? COPD component. PFTs obtained earlier this month. No obstruction noted.   - Goals are diuresis and restoration of NSR as below. 2. CAD- CABG in 2003. S/P LHC 1/3 grafts patent. Per Dr Berrry could consider PCI to RCA. Will ask Dr Hazelyn Kallen to review.  - Continue aspirin 81 mg daily.  - Total Cholesterol 153, HDL 31, LDL 98. TGs 122. Not on statin due to cramping.  - Declined zetia.  3. A/C Systolic/Diastolic Heart Failure - ECHO 11/2016 EF 25-30% and has been low for years. Refused ICD. Can revisit later.   - Volume status remains elevated and only modest diuresis over weekend.  - Increase lasix to 80 mg IV TID and add metolazone 5 mg once.  - Agree with addition of spiro 12.5 mg daily. K cut back.  - No BB for now with relatively soft pressures.  - Intolerant losartan due to dyspnea in the past ? If this was related volume overload. Consider entresto later.  4. PAF - had successful cardioversion 2016 in [place].  - Plan for DCCV tomorrow. Amiodarone switched to po by Gen [employer] 04/10/17. Increase to 400 mg BID.  - Continue eliquis 5 mg BID. No bleeding.  5. Hypothyroidsim - on synthroid. TSH 4.502 on admit (UNL normal 4.500) 6. Obesity:  - Body mass index is 31.64 kg/m.  7. Suspected OSA - Will need outpatient sleep study.    Length of Stay: 2  Michael Andrew Tillery, PA-C  04/11/2017, 10:46 AM  Advanced Heart Failure Team Pager [phone] (M-F; 7a - 4p)  Please contact [facility] for night-coverage after hours (4p -7a ) and weekends on [email or web address]    Patient seen and examined with the above-signed Advanced Practice Provider  and/or Housestaff. I personally reviewed laboratory data, imaging studies and relevant notes. I independently examined the patient and formulated the important aspects of the plan. I have edited the note to reflect any of my changes or salient points. I have personally discussed the plan   with the patient and/or family.  She is tenuous but somewhat improved. Remains in AF with RVR. IV amio switched to po and now having nausea. Will decrease dose to 200 bid. Give one more IV amio bolus to help with load prior to TEE and DC-CV in am. Continue Eliquis. Remains significantly volume overloaded. Will increase lasix and give a dose of metolazone. Watch renal function and electrolytes carefully.   '[name] Moulin, MD  10:40 PM   

## 2017-04-13 LAB — BASIC METABOLIC PANEL
Anion gap: 9 (ref 5–15)
BUN: 19 mg/dL (ref 6–20)
CO2: 27 mmol/L (ref 22–32)
CREATININE: 1.25 mg/dL — AB (ref 0.44–1.00)
Calcium: 8.9 mg/dL (ref 8.9–10.3)
Chloride: 101 mmol/L (ref 101–111)
GFR calc Af Amer: 50 mL/min — ABNORMAL LOW (ref >60)
GFR, EST NON AFRICAN AMERICAN: 43 mL/min — AB (ref >60)
Glucose, Bld: 134 mg/dL — ABNORMAL HIGH (ref 65–99)
Potassium: 4.1 mmol/L (ref 3.5–5.1)
SODIUM: 137 mmol/L (ref 135–145)

## 2017-04-13 MED ORDER — LOSARTAN POTASSIUM 25 MG PO TABS
12.5000 mg | ORAL_TABLET | Freq: Every day | ORAL | Status: DC
  Administered 2017-04-13: 12.5 mg via ORAL
  Filled 2017-04-13: qty 1

## 2017-04-13 MED ORDER — TORSEMIDE 20 MG PO TABS: 40.0000 mg | ORAL_TABLET | Freq: Two times a day (BID) | ORAL | 6 refills | Status: DC

## 2017-04-13 MED ORDER — AMIODARONE HCL 200 MG PO TABS: 200.0000 mg | ORAL_TABLET | Freq: Two times a day (BID) | ORAL | 3 refills | Status: DC

## 2017-04-13 MED ORDER — LOSARTAN POTASSIUM 25 MG PO TABS: 12.5000 mg | ORAL_TABLET | Freq: Every day | ORAL | 6 refills | Status: AC

## 2017-04-13 MED ORDER — SPIRONOLACTONE 25 MG PO TABS: 12.5000 mg | ORAL_TABLET | Freq: Every day | ORAL | 6 refills | Status: DC

## 2017-04-13 MED ORDER — POTASSIUM CHLORIDE CRYS ER 10 MEQ PO TBCR: 20.0000 meq | EXTENDED_RELEASE_TABLET | Freq: Two times a day (BID) | ORAL | 6 refills | Status: DC

## 2017-04-13 NOTE — Progress Notes (Signed)
CARDIAC REHAB PHASE I   PRE:  Rate/Rhythm: 52 SB    BP: sitting 115/62    SaO2: 96 RA  MODE:  Ambulation: 470 ft   POST:  Rate/Rhythm: 67 SR    BP: sitting 118/94     SaO2: 91-92 RA  Tolerated well. Only rest x1. Feels well. SaO2 slightly low at times, pt sts she forgets to breathe. No questions. Excited to go home. [id number]   [name] MassonRandi Kristan Evalette [name] CES, ACSM 04/13/2017 11:29 AM

## 2017-04-13 NOTE — Care Management Important Message (Signed)
Important Message  Patient Details  Name: [name] LoaMary G Albornoz MRN: [id number] Date of Birth: 1945/10/12   Medicare Important Message Given:  Yes    [name], Bud Kaeser Abena 04/13/2017, 10:45 AM

## 2017-04-13 NOTE — Progress Notes (Signed)
Advanced Heart Failure Rounding Note  PCP: Dr. Leandrew KoyanagiBurdine Primary Cardiologist: Dr. Diona BrownerMcDowell HF: Dr. Gala RomneyBensimhon   Subjective:    Underwent DCCV 04/12/17 with return to NSR.   Feeling much better this morning. More energy, Less SOB, feels like abdomen is less "full." She can tell when she is in afib and has had no recurrence.   Negative 300 cc and down 1 lb with transition to po torsemide. Creatinine stable at 1.2  TEE 04/12/17 LVEF 20-25%, Severely HK RV, Mod LAE, Severe RAE, Mild MR, Moderate TR, Trivial PI  Objective:   Weight Range: 175 lb (79.4 kg) Body mass index is 31 kg/m.   Vital Signs:   Temp:  [97.7 F (36.5 C)-98.9 F (37.2 C)] 98.9 F (37.2 C) (10/17 0525) Pulse Rate:  [48-64] 48 (10/17 0525) Resp:  [16-21] 20 (10/17 0525) BP: (96-128)/(52-98) 109/54 (10/17 0525) SpO2:  [90 %-99 %] 97 % (10/17 0525) Weight:  [175 lb (79.4 kg)] 175 lb (79.4 kg) (10/17 0531) Last BM Date: 04/12/17  Weight change: Filed Weights   04/12/17 0443 04/12/17 0715 04/13/17 0531  Weight: 176 lb 12.9 oz (80.2 kg) 176 lb (79.8 kg) 175 lb (79.4 kg)    Intake/Output:   Intake/Output Summary (Last 24 hours) at 04/13/17 0732 Last data filed at 04/13/17 0600  Gross per 24 hour  Intake              300 ml  Output              600 ml  Net             -300 ml      Physical Exam    General: Chronically ill and elderly. No resp difficulty. HEENT: Normal Neck: Supple. JVP ~7 cm. Carotids 2+ bilat; no bruits. No thyromegaly or nodule noted. Cor: PMI nondisplaced. Regular, slightly brady. No M/G/R noted  Lungs: CTAB, normal effort. Abdomen: Soft, non-tender, non-distended, no HSM. No bruits or masses. +BS  Extremities: No cyanosis, clubbing, or rash. Trace ankle edema.  Neuro: Alert & orientedx3, cranial nerves grossly intact. moves all 4 extremities w/o difficulty. Affect pleasant   Telemetry   Sinus brady, Personally reviewed.   EKG    Afib on admit.   Labs    CBC No  results for input(s): WBC, NEUTROABS, HGB, HCT, MCV, PLT in the last 72 hours. Basic Metabolic Panel  Recent Labs  04/12/17 0319 04/13/17 0308  NA 138 137  K 4.4 4.1  CL 100* 101  CO2 27 27  GLUCOSE 149* 134*  BUN 16 19  CREATININE 1.24* 1.25*  CALCIUM 9.4 8.9  MG 2.1  --    Liver Function Tests No results for input(s): AST, ALT, ALKPHOS, BILITOT, PROT, ALBUMIN in the last 72 hours. No results for input(s): LIPASE, AMYLASE in the last 72 hours. Cardiac Enzymes No results for input(s): CKTOTAL, CKMB, CKMBINDEX, TROPONINI in the last 72 hours.  BNP: BNP (last 3 results)  Recent Labs  12/24/16 0824  BNP 385.0*    ProBNP (last 3 results) No results for input(s): PROBNP in the last 8760 hours.   D-Dimer No results for input(s): DDIMER in the last 72 hours. Hemoglobin A1C No results for input(s): HGBA1C in the last 72 hours. Fasting Lipid Panel No results for input(s): CHOL, HDL, LDLCALC, TRIG, CHOLHDL, LDLDIRECT in the last 72 hours. Thyroid Function Tests No results for input(s): TSH, T4TOTAL, T3FREE, THYROIDAB in the last 72 hours.  Invalid input(s): FREET3  Other results:   Imaging    No results found.   Medications:     Scheduled Medications: . amiodarone  200 mg Oral BID  . apixaban  5 mg Oral BID  . aspirin EC  81 mg Oral Daily  . docusate sodium  100 mg Oral BID  . fluticasone  1 spray Each Nare Daily  . levothyroxine  175 mcg Oral QAC breakfast  . loratadine  10 mg Oral Daily  . magnesium oxide  400 mg Oral BID  . neomycin-bacitracin-polymyxin   Topical Daily  . pantoprazole  40 mg Oral Daily  . polyethylene glycol  17 g Oral Daily  . potassium chloride  20 mEq Oral BID  . senna  2 tablet Oral Daily  . sodium chloride flush  3 mL Intravenous Q12H  . sodium chloride flush  3 mL Intravenous Q12H  . spironolactone  12.5 mg Oral Daily  . torsemide  40 mg Oral BID  . traZODone  100 mg Oral QHS    Infusions: . sodium chloride    . sodium  chloride      PRN Medications: sodium chloride, acetaminophen, albuterol, lactulose, ondansetron (ZOFRAN) IV, promethazine, sodium chloride flush, sodium chloride flush    Patient Profile   Ms Metzner is a 71 year old with history of CAD, CABG 2003, PAF, hypothyroidism, and chronic systolic heart failure admitted with increased dyspnea/chest pain.     Found to be back in Afib.   Assessment/Plan   1. A/C Systolic/Diastolic Heart Failure  - TEE 16/10/96 LVEF 20-25%, Severely HK RV, Mod LAE, Severe RAE, Mild MR, Moderate TR, Trivial PI. Has previously refused ICD.  - Volume status looks OK. - Continue torsemide 40 mg BID.  - Continue spiro 12.5 mg daily.  - No BB for now with relatively soft pressures.  - BP too soft for Entresto. - She states that her intolerance to losartan was that it "kept her from sleeping." - Will try on low dose losartan 12.5 mg daily.  - Watch closely for low output after DC-CV today 2. CAD- CABG in 2003. S/P LHC 10/18  1/3 grafts patent. No targets for intervention - No s/s of ischemia.    - Continue aspirin 81 mg daily.  - Total Cholesterol 153, HDL 31, LDL 98. TGs 122. Not on statin due to cramping.  - Declined zetia.  3. PAF - had successful cardioversion 2016 in [place].  - Successful DCCV 04/12/17.  - Remains in NSR on amio 200 bid (unable to tolerate 400 bid due to nausea) - Continue eliquis 5 mg BID. No bleeding. 5. Hypothyroidsim - on synthroid. TSH 4.502 on admit (UNL normal 4.500). No change. 6. Obesity:  - Body mass index is 31 kg/m.  7. Suspected OSA - Will need outpatient sleep study.  No change.  Likely home today vs tomorrow. She feels much better in sinus.   Length of Stay: 4  Luane School  04/13/2017, 7:32 AM  Advanced Heart Failure Team Pager [phone] (M-F; 7a - 4p)  Please contact [facility] for night-coverage after hours (4p -7a ) and weekends on [email or web address]   Patient seen and examined with the  above-signed Advanced Practice Provider and/or Housestaff. I personally reviewed laboratory data, imaging studies and relevant notes. I independently examined the patient and formulated the important aspects of the plan. I have edited the note to reflect any of my changes or salient points. I have personally discussed the plan with the patient and/or family.  Much improved after DC-CV. Volume status looks good. Ok for d/c today on amio 200 bid. Continue Eliquis. Will need close f/u in HF Clinic. Can stop ASA with Eliquis.   [name], MD  12:04 PM

## 2017-04-13 NOTE — Care Management Note (Signed)
Case Management Note  Patient Details  Name: [name] MRN: [id number] Date of Birth: 1946/01/16  Subjective/Objective:                 [facility] coming from [facility], originally from home w spouse son DIL, two kids. Patient denies using DME non oxygen dependent, independent for ADLs, drives.  In obs for CP, s/p cath.    Action/Plan:  CM will continue to follow for DC planning.   Expected Discharge Date:  04/13/17               Expected Discharge Plan:  Home/Self Care  In-House Referral:  NA  Discharge planning Services  CM Consult  Post Acute Care Choice:  NA Choice offered to:  NA  DME Arranged:    DME Agency:     HH Arranged:    HH Agency:     Status of Service:  Completed, signed off  If discussed at Long Length of Stay Meetings, dates discussed:    Discharge Disposition: home/self care   Additional Comments:  04/13/17- 1200- Donn PieriniKristi Jakiera Ehler RN, CM-- pt for d/c home today-s/p cardioversion 10/16-  no CM needs noted for discharge.   Zenda AlpersWebster, Hilmar-IrwinKristi Hall, RN 04/13/2017, 12:14 PM [phone]

## 2017-04-13 NOTE — Discharge Summary (Signed)
Advanced Heart Failure Team  Discharge Summary   Patient ID: [name] MRN: [id number], DOB/AGE: 04-Feb-1946 71 y.o. Admit date: 04/07/2017 D/C date:     04/13/2017   Primary Discharge Diagnoses:  1. A/C Systolic/Diastolic Heart Failure  - TEE 11/91/4710/16/18 LVEF 20-25%, Severely HK RV, Mod LAE, Severe RAE, Mild MR, Moderate TR, Trivial PI. Has previously refused ICD.  2. CAD- CABG in 2003. S/P LHC 10/18  1/3 grafts patent. No targets for intervention 3. PAF - [place]  - Successful TEE/DCCV 04/12/17.  - Remains in NSR on amio 200 bid (unable to tolerate 400 bid due to nausea) - Continue eliquis 5 mg BID 5. Hypothyroidsim - on synthroid.  6. Obesity:  7. Suspected OSA  Hospital Course:  [name] is a 71 year old with history of CAD, CABG 2003, PAF, hypothyroidism, and chronic systolic heart failure.   Admitted with increased dyspnea/chest pain and marked volume overload. Also noted to be in A fib after previously being in NSR.  She underwent RHC/LHC that showed elevated filling pressures and severe CAD with 1/3 grafts patent. She was not thought to be candidate for PCI of RCA. HF team consulted manage A/C systolic heart failure.   Marked volume overload. Diuresed with IV lasix and loaded on amiodarone. Once adequately diuresed she transitioned to torsemide and underwent TEE and successful cardioversion. Maintained NSR and will continue on amiodarone 200 mg twice a day + 5 mg eliquis twice a day.    HF medications optimization will continue in the outpatient setting. Keep off bb for now. Plan to follow closely in the HF clinic next week. We will check BMET and EKG at that time. We will also set up sleep study. We will also need to discuss ICD again.  LHC 04/08/2017   Ost LM to LM lesion, 25 %stenosed.  Prox Cx lesion, 25 %stenosed.  2nd Mrg lesion, 99 %stenosed. SVG to OM is occluded.  Ost 2nd Mrg to 2nd Mrg lesion, 75 %stenosed.  Mid Cx  lesion, 25 %stenosed.  Prox RCA lesion, 80 %stenosed.  Dist RCA lesion, 100 %stenosed. SVG to PLA is patent.  Mid LAD lesion, 100 %stenosed. LIMA to Lad is occluded  LV end diastolic pressure is moderately elevated.  There is no aortic valve stenosis.  Hemodynamic findings consistent with severe pulmonary hypertension.  Ao sat: 95%, PA sat 61%, CO 4.7 L/min; CI 2.57  RHC RA 21, PA 74/36 Mean  57, PCWP 28, CO 4.75, PVR 6.1  Severe three vessel CAD. 1/3 grafts patent. Severe pulmonary hypertension. Dr Could consider PCI of the RCA that feeds the PDA. I think her DOE is related to her pulmonary HTN, volume overload rather than her RCA stenosis.   Discharge Weight: 175 pounds  Discharge Vitals: Blood pressure 110/60, pulse (!) 48, temperature 98.9 F (37.2 C), temperature source Oral, resp. rate 20, height 5\' 3"  (1.6 m), weight 175 lb (79.4 kg), SpO2 98 %.  Labs: Lab Results  Component Value Date   WBC 8.9 04/07/2017   HGB 11.2 (L) 04/07/2017   HCT 34.6 (L) 04/07/2017   MCV 86.7 04/07/2017   PLT 210 04/07/2017    Recent Labs Lab 04/09/17 0331  04/13/17 0308  NA 139  < > 137  K 4.3  < > 4.1  CL 107  < > 101  CO2 21*  < > 27  BUN 19  < > 19  CREATININE 0.98  < > 1.25*  CALCIUM  8.7*  < > 8.9  PROT 6.3*  --   --   BILITOT 0.8  --   --   ALKPHOS 105  --   --   ALT 23  --   --   AST 26  --   --   GLUCOSE 152*  < > 134*  < > = values in this interval not displayed. Lab Results  Component Value Date   CHOL 153 04/09/2017   HDL 31 (L) 04/09/2017   LDLCALC 98 04/09/2017   TRIG 122 04/09/2017   BNP (last 3 results)  Recent Labs  12/24/16 0824  BNP 385.0*    ProBNP (last 3 results) No results for input(s): PROBNP in the last 8760 hours.   Diagnostic Studies/Procedures   No results found.  Discharge Medications   Allergies as of 04/13/2017      Reactions   Oxycodone Other (See Comments)   hallucinations    Pollen Extract Other (See Comments)    Sneezing, running nose.   Statins Other (See Comments)   Cramping       Medication List    STOP taking these medications   carvedilol 12.5 MG tablet Commonly known as:  COREG   furosemide 20 MG tablet Commonly known as:  LASIX   VITAMIN D3 PO     TAKE these medications   acetaminophen 500 MG tablet Commonly known as:  TYLENOL Take 1,000 mg by mouth every 6 (six) hours as needed for moderate pain.   albuterol 108 (90 Base) MCG/ACT inhaler Commonly known as:  PROVENTIL HFA;VENTOLIN HFA Inhale 1 puff into the lungs every 6 (six) hours as needed for wheezing or shortness of breath.   amiodarone 200 MG tablet Commonly known as:  PACERONE Take 1 tablet (200 mg total) by mouth 2 (two) times daily.   apixaban 5 MG Tabs tablet Commonly known as:  ELIQUIS Take 1 tablet (5 mg total) by mouth 2 (two) times daily.   levothyroxine 175 MCG tablet Commonly known as:  SYNTHROID, LEVOTHROID Take 175 mcg by mouth daily before breakfast.   losartan 25 MG tablet Commonly known as:  COZAAR Take 0.5 tablets (12.5 mg total) by mouth daily.   Magnesium 250 MG Tabs Take 250 mg by mouth daily.   nitroGLYCERIN 0.2 mg/hr patch Commonly known as:  NITRODUR - Dosed in mg/24 hr PLACE 1 PATCH ONTO THE SKIN DAILY AS NEEDED FOR CHEST PAINS   omeprazole 40 MG capsule Commonly known as:  PRILOSEC Take 40 mg by mouth daily.   potassium chloride 10 MEQ tablet Commonly known as:  K-DUR,KLOR-CON Take 2 tablets (20 mEq total) by mouth 2 (two) times daily. What changed:  how much to take  when to take this   spironolactone 25 MG tablet Commonly known as:  ALDACTONE Take 0.5 tablets (12.5 mg total) by mouth daily.   torsemide 20 MG tablet Commonly known as:  DEMADEX Take 2 tablets (40 mg total) by mouth 2 (two) times daily.       Disposition   The patient will be discharged in stable condition to home. Discharge Instructions    (HEART FAILURE PATIENTS) Call MD:  Anytime you have any  of the following symptoms: 1) 3 pound weight gain in 24 hours or 5 pounds in 1 week 2) shortness of breath, with or without a dry hacking cough 3) swelling in the hands, feet or stomach 4) if you have to sleep on extra pillows at night in order to breathe.  Complete by:  As directed    Amb Referral to Cardiac Rehabilitation    Complete by:  As directed    Diagnosis:  Heart Failure (see criteria below if ordering Phase II)   Heart Failure Type:  Chronic Systolic   Diet - low sodium heart healthy    Complete by:  As directed    Heart Failure patients record your daily weight using the same scale at the same time of day    Complete by:  As directed    Increase activity slowly    Complete by:  As directed      Follow-up Information    Alenah Sarria, Bevelyn Buckles, MD Follow up on 04/21/2017.   Specialty:  Cardiology Why:  11:00 Garage Code [id number]  Contact information: [address] [place] [address] [phone]             Duration of Discharge Encounter: Greater than 35 minutes   Signed, Tonye Becket NP-C  04/13/2017, 12:00 PM  Patient seen and examined with Tonye Becket, NP. We discussed all aspects of the encounter. I agree with the assessment and plan as stated above.   Much improved after diuresis and DC-CV. TEE with markedly reduced biventricular function. Will need close HF f/u.  [name], MD  10:46 PM

## 2017-04-13 NOTE — Progress Notes (Signed)
Discharge note. Patient educated at bedside. RN educated on medications and when to take them, follow up appointments, signs and symptoms to look for, when to call the MD, and information on new medications. PIV removed without complications.   Patient wheeled out by volunteer services.

## 2017-04-20 ENCOUNTER — Ambulatory Visit: Payer: Medicare Other | Admitting: Cardiology

## 2017-04-21 ENCOUNTER — Inpatient Hospital Stay (HOSPITAL_COMMUNITY): Payer: Medicare Other

## 2017-04-22 ENCOUNTER — Ambulatory Visit (HOSPITAL_COMMUNITY)
Admission: RE | Admit: 2017-04-22 | Discharge: 2017-04-22 | Disposition: A | Discharge status: Home / Self Care | Payer: Medicare Other | Source: Ambulatory Visit | Attending: Internal Medicine | Admitting: Internal Medicine

## 2017-04-22 ENCOUNTER — Encounter (HOSPITAL_COMMUNITY): Payer: Self-pay

## 2017-04-22 VITALS — BP 134/82 | HR 96 | Wt 171.4 lb

## 2017-04-22 DIAGNOSIS — I252 Old myocardial infarction: Secondary | ICD-10-CM | POA: Insufficient documentation

## 2017-04-22 DIAGNOSIS — Z888 Allergy status to other drugs, medicaments and biological substances status: Secondary | ICD-10-CM | POA: Diagnosis not present

## 2017-04-22 DIAGNOSIS — E669 Obesity, unspecified: Secondary | ICD-10-CM | POA: Insufficient documentation

## 2017-04-22 DIAGNOSIS — R079 Chest pain, unspecified: Secondary | ICD-10-CM | POA: Insufficient documentation

## 2017-04-22 DIAGNOSIS — I420 Dilated cardiomyopathy: Secondary | ICD-10-CM | POA: Diagnosis not present

## 2017-04-22 DIAGNOSIS — M25512 Pain in left shoulder: Secondary | ICD-10-CM | POA: Insufficient documentation

## 2017-04-22 DIAGNOSIS — E1151 Type 2 diabetes mellitus with diabetic peripheral angiopathy without gangrene: Secondary | ICD-10-CM | POA: Diagnosis not present

## 2017-04-22 DIAGNOSIS — I4892 Unspecified atrial flutter: Secondary | ICD-10-CM | POA: Diagnosis not present

## 2017-04-22 DIAGNOSIS — K219 Gastro-esophageal reflux disease without esophagitis: Secondary | ICD-10-CM | POA: Insufficient documentation

## 2017-04-22 DIAGNOSIS — Z7901 Long term (current) use of anticoagulants: Secondary | ICD-10-CM | POA: Insufficient documentation

## 2017-04-22 DIAGNOSIS — J45909 Unspecified asthma, uncomplicated: Secondary | ICD-10-CM | POA: Insufficient documentation

## 2017-04-22 DIAGNOSIS — Z9889 Other specified postprocedural states: Secondary | ICD-10-CM | POA: Insufficient documentation

## 2017-04-22 DIAGNOSIS — E039 Hypothyroidism, unspecified: Secondary | ICD-10-CM | POA: Insufficient documentation

## 2017-04-22 DIAGNOSIS — Z8249 Family history of ischemic heart disease and other diseases of the circulatory system: Secondary | ICD-10-CM | POA: Insufficient documentation

## 2017-04-22 DIAGNOSIS — I5042 Chronic combined systolic (congestive) and diastolic (congestive) heart failure: Secondary | ICD-10-CM | POA: Diagnosis not present

## 2017-04-22 DIAGNOSIS — I483 Typical atrial flutter: Secondary | ICD-10-CM | POA: Diagnosis not present

## 2017-04-22 DIAGNOSIS — I255 Ischemic cardiomyopathy: Secondary | ICD-10-CM | POA: Diagnosis not present

## 2017-04-22 DIAGNOSIS — M549 Dorsalgia, unspecified: Secondary | ICD-10-CM | POA: Insufficient documentation

## 2017-04-22 DIAGNOSIS — I2510 Atherosclerotic heart disease of native coronary artery without angina pectoris: Secondary | ICD-10-CM | POA: Insufficient documentation

## 2017-04-22 DIAGNOSIS — Z6830 Body mass index (BMI) 30.0-30.9, adult: Secondary | ICD-10-CM | POA: Insufficient documentation

## 2017-04-22 DIAGNOSIS — Z889 Allergy status to unspecified drugs, medicaments and biological substances status: Secondary | ICD-10-CM | POA: Insufficient documentation

## 2017-04-22 DIAGNOSIS — I110 Hypertensive heart disease with heart failure: Secondary | ICD-10-CM | POA: Insufficient documentation

## 2017-04-22 DIAGNOSIS — Z885 Allergy status to narcotic agent status: Secondary | ICD-10-CM | POA: Insufficient documentation

## 2017-04-22 DIAGNOSIS — E785 Hyperlipidemia, unspecified: Secondary | ICD-10-CM | POA: Insufficient documentation

## 2017-04-22 DIAGNOSIS — I480 Paroxysmal atrial fibrillation: Secondary | ICD-10-CM | POA: Insufficient documentation

## 2017-04-22 DIAGNOSIS — Z79899 Other long term (current) drug therapy: Secondary | ICD-10-CM | POA: Insufficient documentation

## 2017-04-22 DIAGNOSIS — Z87891 Personal history of nicotine dependence: Secondary | ICD-10-CM | POA: Insufficient documentation

## 2017-04-22 DIAGNOSIS — Z7982 Long term (current) use of aspirin: Secondary | ICD-10-CM | POA: Insufficient documentation

## 2017-04-22 DIAGNOSIS — Z951 Presence of aortocoronary bypass graft: Secondary | ICD-10-CM | POA: Insufficient documentation

## 2017-04-22 DIAGNOSIS — Z803 Family history of malignant neoplasm of breast: Secondary | ICD-10-CM | POA: Insufficient documentation

## 2017-04-22 DIAGNOSIS — Z823 Family history of stroke: Secondary | ICD-10-CM | POA: Insufficient documentation

## 2017-04-22 LAB — BASIC METABOLIC PANEL
Anion gap: 13 (ref 5–15)
BUN: 31 mg/dL — ABNORMAL HIGH (ref 6–20)
CALCIUM: 9.5 mg/dL (ref 8.9–10.3)
CO2: 23 mmol/L (ref 22–32)
CREATININE: 1.43 mg/dL — AB (ref 0.44–1.00)
Chloride: 97 mmol/L — ABNORMAL LOW (ref 101–111)
GFR calc Af Amer: 42 mL/min — ABNORMAL LOW (ref >60)
GFR calc non Af Amer: 36 mL/min — ABNORMAL LOW (ref >60)
Glucose, Bld: 126 mg/dL — ABNORMAL HIGH (ref 65–99)
Potassium: 3.8 mmol/L (ref 3.5–5.1)
SODIUM: 133 mmol/L — AB (ref 135–145)

## 2017-04-22 MED ORDER — CARVEDILOL 3.125 MG PO TABS
3.1250 mg | ORAL_TABLET | Freq: Two times a day (BID) | ORAL | 6 refills | Status: DC
Start: 2017-04-22 — End: 2017-04-22

## 2017-04-22 MED ORDER — RANOLAZINE ER 500 MG PO TB12: 500.0000 mg | ORAL_TABLET | Freq: Two times a day (BID) | ORAL | 6 refills | Status: DC

## 2017-04-22 NOTE — H&P (View-Only) (Signed)
Advanced Heart Failure Clinic Note   Primary Care: Juliette AlcideBurdine, Steven E, MD Primary Cardiologist: Dr. Diona BrownerMcDowell  HPI: [name] is a 71 y.o. female with a hx of Chronic dyspnea on exertion, CAD, multivessel status post coronary artery bypass grafting in 2003, chronic combined systolic and diastolic heart failure, hypertension, history of atrial flutter with cardioversion in 2016, hyperlipidemia, hypothyroidism, and ischemic cardiomyopathy.  She presented to the ED on 04/07/17 with complaints of chest pain and back pain described as stabbing pain in the left scapula, reminiscent of pain she experienced prior to MI in the past. No associated diaphoresis. The patient was recently seen by Dr. Diona BrownerMcDowell on 03/28/2017 with symptoms of dyspnea and discomfort, was ordered a [employer] Myoview. The test was found to be high risk with a large anterior apical and lateral wall infarct. EKG revealed atrial fibrillation with T-wave flattening in the lateral, and inferior leads, heart rate of 87 bpm. Essentially unchanged from prior EKG in July 2018. She underwent LHC that showed severe 3 vessel CAD, 1/3 patent grafts. There were no good targets for PCI. It was felt that her atrial fibrillation was contributing to her low EF. She was started on Amiodarone and underwent DCCV with successful return to NSR. Echo showed EF 20-25%, severely HK RV. Discharge weight was 175 pounds.   Returns today for HF follow up. Feeling well, denies SOB and orthopnea. EKG shows atrial flutter with rate of 98 bpm. She denies palpitations, chest pain. Taking all medications. Weight is stable, down 4 pounds from discharge weight.     Review of Systems: [y] = yes, [ ]  = no   General: Weight gain [ ] ; Weight loss [ ] ; Anorexia [ ] ; Fatigue [ ] ; Fever [ ] ; Chills [ ] ; Weakness [ ]   Cardiac: Chest pain/pressure [ ] ; Resting SOB [ ] ; Exertional SOB [ ] ; Orthopnea [ ] ; Pedal Edema [ ] ; Palpitations [ ] ; Syncope [ ] ; Presyncope [ ] ; Paroxysmal  nocturnal dyspnea[ ]   Pulmonary: Cough [ ] ; Wheezing[ ] ; Hemoptysis[ ] ; Sputum [ ] ; Snoring [ ]   GI: Vomiting[ ] ; Dysphagia[ ] ; Melena[ ] ; Hematochezia [ ] ; Heartburn[ ] ; Abdominal pain [ ] ; Constipation [ ] ; Diarrhea [ ] ; BRBPR [ ]   GU: Hematuria[ ] ; Dysuria [ ] ; Nocturia[ ]   Vascular: Pain in legs with walking [ ] ; Pain in feet with lying flat [ ] ; Non-healing sores [ ] ; Stroke [ ] ; TIA [ ] ; Slurred speech [ ] ;  Neuro: Headaches[ ] ; Vertigo[ ] ; Seizures[ ] ; Paresthesias[ ] ;Blurred vision [ ] ; Diplopia [ ] ; Vision changes [ ]   Ortho/Skin: Arthritis [ y]; Joint pain [ ] ; Muscle pain [ ] ; Joint swelling [ ] ; Back Pain [ ] ; Rash [ ]   Psych: Depression[ ] ; Anxiety[ ]   Heme: Bleeding problems [ ] ; Clotting disorders [ ] ; Anemia [ ]   Endocrine: Diabetes [ ] ; Thyroid dysfunction[ ]    Past Medical History:  Diagnosis Date  . Asthma   . CAD (coronary artery disease)    Multivessel status post CABG in 2003  . Chronic combined systolic (congestive) and diastolic (congestive) heart failure (HCC) 12/25/2016  . Chronic systolic heart failure (HCC)   . Essential hypertension   . GERD (gastroesophageal reflux disease)   . Gum disease   . History of atrial flutter    [place]  . History of colonic polyps   . History of goiter   . Hyperlipidemia   . Hypothyroidism   . Ischemic cardiomyopathy 12/25/2016  . Myocardial infarction (  HCC)    2003  . Peripheral arterial disease (HCC)    Stent revascularization 2015 - details not clear  . Renal insufficiency   . Type 2 diabetes mellitus (HCC)     Current Outpatient Prescriptions  Medication Sig Dispense Refill  . acetaminophen (TYLENOL) 500 MG tablet Take 1,000 mg by mouth every 6 (six) hours as needed for moderate pain.    Marland Kitchen albuterol (PROVENTIL HFA;VENTOLIN HFA) 108 (90 Base) MCG/ACT inhaler Inhale 1 puff into the lungs every 6 (six) hours as needed for wheezing or shortness of breath.    Marland Kitchen amiodarone (PACERONE) 200 MG tablet  Take 1 tablet (200 mg total) by mouth 2 (two) times daily. 60 tablet 3  . apixaban (ELIQUIS) 5 MG TABS tablet Take 1 tablet (5 mg total) by mouth 2 (two) times daily. 60 tablet 8  . levothyroxine (SYNTHROID, LEVOTHROID) 175 MCG tablet Take 175 mcg by mouth daily before breakfast.    . losartan (COZAAR) 25 MG tablet Take 0.5 tablets (12.5 mg total) by mouth daily. 30 tablet 6  . Magnesium 250 MG TABS Take 250 mg by mouth daily.     Marland Kitchen omeprazole (PRILOSEC) 40 MG capsule Take 40 mg by mouth daily.     Marland Kitchen spironolactone (ALDACTONE) 25 MG tablet Take 0.5 tablets (12.5 mg total) by mouth daily. 30 tablet 6  . torsemide (DEMADEX) 20 MG tablet Take 2 tablets (40 mg total) by mouth 2 (two) times daily. 120 tablet 6  . nitroGLYCERIN (NITRODUR - DOSED IN MG/24 HR) 0.2 mg/hr patch PLACE 1 PATCH ONTO THE SKIN DAILY AS NEEDED FOR CHEST PAINS (Patient not taking: Reported on 04/22/2017) 90 patch 3  . potassium chloride (K-DUR,KLOR-CON) 10 MEQ tablet Take 2 tablets (20 mEq total) by mouth 2 (two) times daily. (Patient not taking: Reported on 04/22/2017) 120 tablet 6   No current facility-administered medications for this encounter.     Allergies  Allergen Reactions  . Oxycodone Other (See Comments)    hallucinations   . Pollen Extract Other (See Comments)    Sneezing, running nose.  . Statins Other (See Comments)    Cramping       Social History   Social History  . Marital status: Married    Spouse name: N/A  . Number of children: N/A  . Years of education: N/A   Occupational History  . Not on file.   Social History Main Topics  . Smoking status: Former Smoker    Types: Cigarettes  . Smokeless tobacco: Never Used  . Alcohol use No  . Drug use: No  . Sexual activity: Not on file   Other Topics Concern  . Not on file   Social History Narrative  . No narrative on file      Family History  Problem Relation Age of Onset  . Stroke Mother   . Heart attack Father   . Breast cancer  Maternal Aunt   . Breast cancer Paternal Aunt     Vitals:   04/22/17 1522  BP: 134/82  Pulse: 96  SpO2: 98%  Weight: 171 lb 6.4 oz (77.7 kg)     PHYSICAL EXAM: General:  Well appearing. No respiratory difficulty HEENT: normal Neck: supple. no JVD. Carotids 2+ bilat; no bruits. No lymphadenopathy or thyromegaly appreciated. Cor: PMI nondisplaced.Tachy, irregular rate & rhythm. No rubs, gallops or murmurs. Lungs: clear Abdomen: soft, nontender, nondistended. No hepatosplenomegaly. No bruits or masses. Good bowel sounds. Extremities: no cyanosis, clubbing, rash, edema Neuro: alert &  oriented x 3, cranial nerves grossly intact. moves all 4 extremities w/o difficulty. Affect pleasant.  ECG: atrial flutter, 98 bpm.   ASSESSMENT & PLAN: 1. Chronic combined systolic and diastolic HF:  - TEE 04/12/17 LVEF 20-25%, Severely HK RV, Mod LAE, Severe RAE, Mild MR, Moderate TR, Trivial PI. Has previously refused ICD. EF previously 30-35%, NICM/ICM. Suspect that atrial fib/flutter has worsened CM. S/p DCCV in 03/2017.  - Now presents with recurrent Aflutter. Spoke with Dr. Gala Romney. Will add Ranexa 500 mg BID. Continue Amio 200 mg BID and plan for DCCV in 2 weeks.  - Continue torsemide 40 mg BID.  - Continue Spiro 12.5 mg daily.  - Continue losartan 12.5 mg daily  2. CAD- CABG in 2003. S/P LHC 10/18  1/3 grafts patent. No targets for intervention - Denies chest pain.  - - Total Cholesterol 153, HDL 31, LDL 98. TGs 122. Not on statin due to cramping. Declined Zetia.  - Continue ASA 81 mg daily.   3. PAF - had successful cardioversion 2016 in [place].  - Successful DCCV 04/12/17. Now back in AFlutter.  - Plan for DCCV in 2 weeks with Dr.Bensimhon - Continue Eliquis for anticoagulation.   5. Hypothyroidsim - Continue synthroid  6. Obesity:  -Body mass index is 30.36 kg/m. - Encouraged her to limit portion size.   7. Suspected OSA - Will schedule sleep study at next visit.  Plans  as above. Follow up in 1 month.     Little Ishikawa, NP 04/22/17

## 2017-04-22 NOTE — Progress Notes (Signed)
onday

## 2017-04-22 NOTE — Progress Notes (Addendum)
Advanced Heart Failure Clinic Note   Primary Care: Juliette AlcideBurdine, Steven E, MD Primary Cardiologist: Dr. Diona BrownerMcDowell  HPI: [name] is a 71 y.o. female with a hx of Chronic dyspnea on exertion, CAD, multivessel status post coronary artery bypass grafting in 2003, chronic combined systolic and diastolic heart failure, hypertension, history of atrial flutter with cardioversion in 2016, hyperlipidemia, hypothyroidism, and ischemic cardiomyopathy.  She presented to the ED on 04/07/17 with complaints of chest pain and back pain described as stabbing pain in the left scapula, reminiscent of pain she experienced prior to MI in the past. No associated diaphoresis. The patient was recently seen by Dr. Diona BrownerMcDowell on 03/28/2017 with symptoms of dyspnea and discomfort, was ordered a [employer] Myoview. The test was found to be high risk with a large anterior apical and lateral wall infarct. EKG revealed atrial fibrillation with T-wave flattening in the lateral, and inferior leads, heart rate of 87 bpm. Essentially unchanged from prior EKG in July 2018. She underwent LHC that showed severe 3 vessel CAD, 1/3 patent grafts. There were no good targets for PCI. It was felt that her atrial fibrillation was contributing to her low EF. She was started on Amiodarone and underwent DCCV with successful return to NSR. Echo showed EF 20-25%, severely HK RV. Discharge weight was 175 pounds.   Returns today for HF follow up. Feeling well, denies SOB and orthopnea. EKG shows atrial flutter with rate of 98 bpm. She denies palpitations, chest pain. Taking all medications. Weight is stable, down 4 pounds from discharge weight.     Review of Systems: [y] = yes, [ ]  = no   General: Weight gain [ ] ; Weight loss [ ] ; Anorexia [ ] ; Fatigue [ ] ; Fever [ ] ; Chills [ ] ; Weakness [ ]   Cardiac: Chest pain/pressure [ ] ; Resting SOB [ ] ; Exertional SOB [ ] ; Orthopnea [ ] ; Pedal Edema [ ] ; Palpitations [ ] ; Syncope [ ] ; Presyncope [ ] ; Paroxysmal  nocturnal dyspnea[ ]   Pulmonary: Cough [ ] ; Wheezing[ ] ; Hemoptysis[ ] ; Sputum [ ] ; Snoring [ ]   GI: Vomiting[ ] ; Dysphagia[ ] ; Melena[ ] ; Hematochezia [ ] ; Heartburn[ ] ; Abdominal pain [ ] ; Constipation [ ] ; Diarrhea [ ] ; BRBPR [ ]   GU: Hematuria[ ] ; Dysuria [ ] ; Nocturia[ ]   Vascular: Pain in legs with walking [ ] ; Pain in feet with lying flat [ ] ; Non-healing sores [ ] ; Stroke [ ] ; TIA [ ] ; Slurred speech [ ] ;  Neuro: Headaches[ ] ; Vertigo[ ] ; Seizures[ ] ; Paresthesias[ ] ;Blurred vision [ ] ; Diplopia [ ] ; Vision changes [ ]   Ortho/Skin: Arthritis [ y]; Joint pain [ ] ; Muscle pain [ ] ; Joint swelling [ ] ; Back Pain [ ] ; Rash [ ]   Psych: Depression[ ] ; Anxiety[ ]   Heme: Bleeding problems [ ] ; Clotting disorders [ ] ; Anemia [ ]   Endocrine: Diabetes [ ] ; Thyroid dysfunction[ ]    Past Medical History:  Diagnosis Date  . Asthma   . CAD (coronary artery disease)    Multivessel status post CABG in 2003  . Chronic combined systolic (congestive) and diastolic (congestive) heart failure (HCC) 12/25/2016  . Chronic systolic heart failure (HCC)   . Essential hypertension   . GERD (gastroesophageal reflux disease)   . Gum disease   . History of atrial flutter    [place]  . History of colonic polyps   . History of goiter   . Hyperlipidemia   . Hypothyroidism   . Ischemic cardiomyopathy 12/25/2016  . Myocardial infarction (  HCC)    2003  . Peripheral arterial disease (HCC)    Stent revascularization 2015 - details not clear  . Renal insufficiency   . Type 2 diabetes mellitus (HCC)     Current Outpatient Prescriptions  Medication Sig Dispense Refill  . acetaminophen (TYLENOL) 500 MG tablet Take 1,000 mg by mouth every 6 (six) hours as needed for moderate pain.    Marland Kitchen albuterol (PROVENTIL HFA;VENTOLIN HFA) 108 (90 Base) MCG/ACT inhaler Inhale 1 puff into the lungs every 6 (six) hours as needed for wheezing or shortness of breath.    Marland Kitchen amiodarone (PACERONE) 200 MG tablet  Take 1 tablet (200 mg total) by mouth 2 (two) times daily. 60 tablet 3  . apixaban (ELIQUIS) 5 MG TABS tablet Take 1 tablet (5 mg total) by mouth 2 (two) times daily. 60 tablet 8  . levothyroxine (SYNTHROID, LEVOTHROID) 175 MCG tablet Take 175 mcg by mouth daily before breakfast.    . losartan (COZAAR) 25 MG tablet Take 0.5 tablets (12.5 mg total) by mouth daily. 30 tablet 6  . Magnesium 250 MG TABS Take 250 mg by mouth daily.     Marland Kitchen omeprazole (PRILOSEC) 40 MG capsule Take 40 mg by mouth daily.     Marland Kitchen spironolactone (ALDACTONE) 25 MG tablet Take 0.5 tablets (12.5 mg total) by mouth daily. 30 tablet 6  . torsemide (DEMADEX) 20 MG tablet Take 2 tablets (40 mg total) by mouth 2 (two) times daily. 120 tablet 6  . nitroGLYCERIN (NITRODUR - DOSED IN MG/24 HR) 0.2 mg/hr patch PLACE 1 PATCH ONTO THE SKIN DAILY AS NEEDED FOR CHEST PAINS (Patient not taking: Reported on 04/22/2017) 90 patch 3  . potassium chloride (K-DUR,KLOR-CON) 10 MEQ tablet Take 2 tablets (20 mEq total) by mouth 2 (two) times daily. (Patient not taking: Reported on 04/22/2017) 120 tablet 6   No current facility-administered medications for this encounter.     Allergies  Allergen Reactions  . Oxycodone Other (See Comments)    hallucinations   . Pollen Extract Other (See Comments)    Sneezing, running nose.  . Statins Other (See Comments)    Cramping       Social History   Social History  . Marital status: Married    Spouse name: N/A  . Number of children: N/A  . Years of education: N/A   Occupational History  . Not on file.   Social History Main Topics  . Smoking status: Former Smoker    Types: Cigarettes  . Smokeless tobacco: Never Used  . Alcohol use No  . Drug use: No  . Sexual activity: Not on file   Other Topics Concern  . Not on file   Social History Narrative  . No narrative on file      Family History  Problem Relation Age of Onset  . Stroke Mother   . Heart attack Father   . Breast cancer  Maternal Aunt   . Breast cancer Paternal Aunt     Vitals:   04/22/17 1522  BP: 134/82  Pulse: 96  SpO2: 98%  Weight: 171 lb 6.4 oz (77.7 kg)     PHYSICAL EXAM: General:  Well appearing. No respiratory difficulty HEENT: normal Neck: supple. no JVD. Carotids 2+ bilat; no bruits. No lymphadenopathy or thyromegaly appreciated. Cor: PMI nondisplaced.Tachy, irregular rate & rhythm. No rubs, gallops or murmurs. Lungs: clear Abdomen: soft, nontender, nondistended. No hepatosplenomegaly. No bruits or masses. Good bowel sounds. Extremities: no cyanosis, clubbing, rash, edema Neuro: alert &  oriented x 3, cranial nerves grossly intact. moves all 4 extremities w/o difficulty. Affect pleasant.  ECG: atrial flutter, 98 bpm.   ASSESSMENT & PLAN: 1. Chronic combined systolic and diastolic HF:  - TEE 04/12/17 LVEF 20-25%, Severely HK RV, Mod LAE, Severe RAE, Mild MR, Moderate TR, Trivial PI. Has previously refused ICD. EF previously 30-35%, NICM/ICM. Suspect that atrial fib/flutter has worsened CM. S/p DCCV in 03/2017.  - Now presents with recurrent Aflutter. Spoke with Dr. Gala Romney. Will add Ranexa 500 mg BID. Continue Amio 200 mg BID and plan for DCCV in 2 weeks.  - Continue torsemide 40 mg BID.  - Continue Spiro 12.5 mg daily.  - Continue losartan 12.5 mg daily  2. CAD- CABG in 2003. S/P LHC 10/18  1/3 grafts patent. No targets for intervention - Denies chest pain.  - - Total Cholesterol 153, HDL 31, LDL 98. TGs 122. Not on statin due to cramping. Declined Zetia.  - Continue ASA 81 mg daily.   3. PAF - had successful cardioversion 2016 in [place].  - Successful DCCV 04/12/17. Now back in AFlutter.  - Plan for DCCV in 2 weeks with Dr.Bensimhon - Continue Eliquis for anticoagulation.   5. Hypothyroidsim - Continue synthroid  6. Obesity:  -Body mass index is 30.36 kg/m. - Encouraged her to limit portion size.   7. Suspected OSA - Will schedule sleep study at next visit.  Plans  as above. Follow up in 1 month.     Little Ishikawa, NP 04/22/17

## 2017-04-22 NOTE — Patient Instructions (Addendum)
START Ranexa 500 mg tablet twice daily.  You have been scheduled for a cardioversion. Please see attached instruction sheet for additional details.  Routine lab work today. Will notify you of abnormal results, otherwise no news is good news!  Follow up 1 month.  Take all medication as prescribed the day of your appointment. Bring all medications with you to your appointment.  Do the following things EVERYDAY: 1) Weigh yourself in the morning before breakfast. Write it down and keep it in a log. 2) Take your medicines as prescribed 3) Eat low salt foods-Limit salt (sodium) to 2000 mg per day.  4) Stay as active as you can everyday 5) Limit all fluids for the day to less than 2 liters

## 2017-04-25 ENCOUNTER — Telehealth (HOSPITAL_COMMUNITY): Payer: Self-pay | Admitting: *Deleted

## 2017-04-25 NOTE — Addendum Note (Signed)
Encounter addended by: Little IshikawaSmith, Erin E, NP on: 04/25/2017  2:39 PM<BR>    Actions taken: Sign clinical note

## 2017-04-25 NOTE — Telephone Encounter (Signed)
Pt aware and agreeable.  

## 2017-04-25 NOTE — Telephone Encounter (Signed)
She should not be on Coreg. Ok to stop taking Ranexa if she cannot tolerate it.   Little IshikawaErin E Vontae Court 2:40 PM

## 2017-04-25 NOTE — Addendum Note (Signed)
Addended by: Noralee SpaceSCHUB, Jonn Chaikin M on: 04/25/2017 04:25 PM   Modules accepted: Orders

## 2017-04-25 NOTE — Telephone Encounter (Signed)
Advanced Heart Failure Triage Encounter  Patient Name: [name]  Date of Call: 04/25/17  Problem:  Pt called to report she started the Ranexa Friday night and she developed a HA and nausea and was unable to sleep.  She states she was nervous to take in the morning she has only been taking it at night but gets these symptoms everytime she takes it so she would like to stop it.  She also states when she picked it up from the pharmacy they also had a prescription for Carvedilol that we sent in but she wasn't told about this medication so she has not taken it but would like to know if she should or not.  Plan:  Will send to [name] RighterErin Smith, NP to advise what pt should do with the Ranexa and Carvedilol and call her back.  [name] StaggersHeather Reianna Batdorf, RN

## 2017-04-26 ENCOUNTER — Other Ambulatory Visit (HOSPITAL_COMMUNITY): Payer: Self-pay

## 2017-04-26 ENCOUNTER — Other Ambulatory Visit: Payer: Self-pay | Admitting: Cardiology

## 2017-04-26 DIAGNOSIS — R0600 Dyspnea, unspecified: Secondary | ICD-10-CM

## 2017-04-26 NOTE — Telephone Encounter (Signed)
Samples Eliquis 5 mg , 3 boxes to patient   2 boxes lot [id number] exp 04/2019, 1 box [id number]  Exp 05/2019   Patient states Dr.McDowell told her he had 191 pages of her medical records from [place] and she wants him to look at her "bump" on calf .

## 2017-04-26 NOTE — Telephone Encounter (Signed)
Pt would like to know if she could get samples of her Eliquis?   Also, pt wants to make sure the reports about the stents in her legs are seen prior to her apt on the 14th.

## 2017-05-02 ENCOUNTER — Ambulatory Visit (HOSPITAL_COMMUNITY)
Admission: RE | Admit: 2017-05-02 | Discharge: 2017-05-02 | Disposition: A | Discharge status: Home / Self Care | Payer: Medicare Other | Source: Ambulatory Visit | Attending: Internal Medicine | Admitting: Internal Medicine

## 2017-05-02 ENCOUNTER — Ambulatory Visit (HOSPITAL_COMMUNITY): Payer: Medicare Other | Admitting: Anesthesiology

## 2017-05-02 ENCOUNTER — Encounter (HOSPITAL_COMMUNITY)
Admission: RE | Disposition: A | Discharge status: Home / Self Care | Payer: Self-pay | Source: Ambulatory Visit | Attending: Internal Medicine

## 2017-05-02 ENCOUNTER — Encounter (HOSPITAL_COMMUNITY): Payer: Self-pay | Admitting: *Deleted

## 2017-05-02 ENCOUNTER — Other Ambulatory Visit: Payer: Self-pay

## 2017-05-02 DIAGNOSIS — I255 Ischemic cardiomyopathy: Secondary | ICD-10-CM | POA: Insufficient documentation

## 2017-05-02 DIAGNOSIS — I4891 Unspecified atrial fibrillation: Secondary | ICD-10-CM | POA: Diagnosis not present

## 2017-05-02 DIAGNOSIS — I5042 Chronic combined systolic (congestive) and diastolic (congestive) heart failure: Secondary | ICD-10-CM | POA: Diagnosis not present

## 2017-05-02 DIAGNOSIS — I480 Paroxysmal atrial fibrillation: Secondary | ICD-10-CM | POA: Insufficient documentation

## 2017-05-02 DIAGNOSIS — Z951 Presence of aortocoronary bypass graft: Secondary | ICD-10-CM | POA: Diagnosis not present

## 2017-05-02 DIAGNOSIS — I110 Hypertensive heart disease with heart failure: Secondary | ICD-10-CM | POA: Diagnosis not present

## 2017-05-02 DIAGNOSIS — E669 Obesity, unspecified: Secondary | ICD-10-CM | POA: Insufficient documentation

## 2017-05-02 DIAGNOSIS — Z79899 Other long term (current) drug therapy: Secondary | ICD-10-CM | POA: Diagnosis not present

## 2017-05-02 DIAGNOSIS — Z79890 Hormone replacement therapy (postmenopausal): Secondary | ICD-10-CM | POA: Diagnosis not present

## 2017-05-02 DIAGNOSIS — Z7901 Long term (current) use of anticoagulants: Secondary | ICD-10-CM | POA: Insufficient documentation

## 2017-05-02 DIAGNOSIS — K219 Gastro-esophageal reflux disease without esophagitis: Secondary | ICD-10-CM | POA: Insufficient documentation

## 2017-05-02 DIAGNOSIS — Z87891 Personal history of nicotine dependence: Secondary | ICD-10-CM | POA: Insufficient documentation

## 2017-05-02 DIAGNOSIS — I2510 Atherosclerotic heart disease of native coronary artery without angina pectoris: Secondary | ICD-10-CM | POA: Diagnosis not present

## 2017-05-02 DIAGNOSIS — Z7951 Long term (current) use of inhaled steroids: Secondary | ICD-10-CM | POA: Insufficient documentation

## 2017-05-02 DIAGNOSIS — E1151 Type 2 diabetes mellitus with diabetic peripheral angiopathy without gangrene: Secondary | ICD-10-CM | POA: Diagnosis not present

## 2017-05-02 DIAGNOSIS — Z6830 Body mass index (BMI) 30.0-30.9, adult: Secondary | ICD-10-CM | POA: Insufficient documentation

## 2017-05-02 DIAGNOSIS — R0600 Dyspnea, unspecified: Secondary | ICD-10-CM

## 2017-05-02 DIAGNOSIS — Z7984 Long term (current) use of oral hypoglycemic drugs: Secondary | ICD-10-CM | POA: Diagnosis not present

## 2017-05-02 DIAGNOSIS — J45909 Unspecified asthma, uncomplicated: Secondary | ICD-10-CM | POA: Insufficient documentation

## 2017-05-02 DIAGNOSIS — E039 Hypothyroidism, unspecified: Secondary | ICD-10-CM | POA: Diagnosis not present

## 2017-05-02 DIAGNOSIS — I252 Old myocardial infarction: Secondary | ICD-10-CM | POA: Diagnosis not present

## 2017-05-02 HISTORY — PX: CARDIOVERSION: SHX1299

## 2017-05-02 SURGERY — CARDIOVERSION
Anesthesia: General

## 2017-05-02 MED ORDER — LIDOCAINE HCL (CARDIAC) 20 MG/ML IV SOLN
INTRAVENOUS | Status: DC | PRN
  Administered 2017-05-02: 40 mg via INTRAVENOUS

## 2017-05-02 MED ORDER — SODIUM CHLORIDE 0.9 % IV SOLN
INTRAVENOUS | Status: DC
  Administered 2017-05-02 (×2): via INTRAVENOUS

## 2017-05-02 MED ORDER — HYDROCORTISONE 1 % EX CREA: 1.0000 "application " | TOPICAL_CREAM | Freq: Three times a day (TID) | CUTANEOUS | Status: DC | PRN

## 2017-05-02 MED ORDER — SODIUM CHLORIDE 0.9% FLUSH: 3.0000 mL | Freq: Two times a day (BID) | INTRAVENOUS | Status: DC

## 2017-05-02 MED ORDER — SODIUM CHLORIDE 0.9% FLUSH: 3.0000 mL | INTRAVENOUS | Status: DC | PRN

## 2017-05-02 MED ORDER — SODIUM CHLORIDE 0.9 % IV SOLN: 250.0000 mL | INTRAVENOUS | Status: DC

## 2017-05-02 MED ORDER — PROPOFOL 10 MG/ML IV BOLUS
INTRAVENOUS | Status: DC | PRN
Start: 2017-05-02 — End: 2017-05-02
  Administered 2017-05-02: 60 mg via INTRAVENOUS

## 2017-05-02 NOTE — Interval H&P Note (Signed)
History and Physical Interval Note:  05/02/2017 9:02 AM  [name]  has presented today for surgery, with the diagnosis of afib  The various methods of treatment have been discussed with the patient and family. After consideration of risks, benefits and other options for treatment, the patient has consented to  Procedure(s): CARDIOVERSION (N/A) as a surgical intervention .  The patient's history has been reviewed, patient examined, no change in status, stable for surgery.  I have reviewed the patient's chart and labs.  Questions were answered to the patient's satisfaction.     Mariaeduarda Defranco, Reuel Boomaniel

## 2017-05-02 NOTE — Anesthesia Preprocedure Evaluation (Addendum)
Anesthesia Evaluation  Patient identified by MRN, date of birth, ID band Patient awake    Reviewed: Allergy & Precautions, NPO status , Patient's Chart, lab work & pertinent test results, reviewed documented beta blocker date and time   Airway Mallampati: II  TM Distance: >3 FB Neck ROM: Full    Dental  (+) Edentulous Upper, Edentulous Lower   Pulmonary asthma , former smoker,    Pulmonary exam normal breath sounds clear to auscultation       Cardiovascular hypertension, Pt. on medications and Pt. on home beta blockers + CAD, + Past MI, + Peripheral Vascular Disease and +CHF  + dysrhythmias Atrial Fibrillation  Rhythm:Irregular Rate:Normal + Systolic murmurs MI 2003 CABG [id number] Stents 2015 Echo 12/25/13: Normal LV wall thickness with mild chamber dilatation and LVEF approximately 25-30%. There is mid to apical anteroseptal and apical akinesis with severe hypokinesis of the apical inferior wall. Indeterminate diastolic function. Mildly thickened mitral leaflets with mild mitral regurgitation. Mildly sclerotic aortic valve. Moderately reduced right ventricular contraction. Mild tricuspid regurgitation with PASP estimated 40 mmHg   Neuro/Psych negative neurological ROS  negative psych ROS   GI/Hepatic Neg liver ROS, GERD  Medicated and Controlled,  Endo/Other  diabetes, Well Controlled, Type 2, Oral Hypoglycemic AgentsHypothyroidism Obesity Hyperlipidemia  Renal/GU Renal InsufficiencyRenal disease  negative genitourinary   Musculoskeletal negative musculoskeletal ROS (+)   Abdominal (+) + obese,   Peds  Hematology Eliquis- last dose this am   Anesthesia Other Findings   Reproductive/Obstetrics                            Anesthesia Physical Anesthesia Plan  ASA: III  Anesthesia Plan: General   Post-op Pain Management:    Induction: Intravenous  PONV Risk Score and Plan: 3 and Ondansetron,  Propofol infusion, Metaclopromide and Treatment may vary due to age or medical condition  Airway Management Planned: Natural Airway and Mask  Additional Equipment:   Intra-op Plan:   Post-operative Plan:   Informed Consent: I have reviewed the patients History and Physical, chart, labs and discussed the procedure including the risks, benefits and alternatives for the proposed anesthesia with the patient or authorized representative who has indicated his/her understanding and acceptance.     Plan Discussed with: CRNA, Anesthesiologist and Surgeon  Anesthesia Plan Comments:         Anesthesia Quick Evaluation

## 2017-05-02 NOTE — Discharge Instructions (Signed)
Electrical Cardioversion, Care After °This sheet gives you information about how to care for yourself after your procedure. Your health care provider may also give you more specific instructions. If you have problems or questions, contact your health care provider. °What can I expect after the procedure? °After the procedure, it is common to have: °· Some redness on the skin where the shocks were given. ° °Follow these instructions at home: °· Do not drive for 24 hours if you were given a medicine to help you relax (sedative). °· Take over-the-counter and prescription medicines only as told by your health care provider. °· Ask your health care provider how to check your pulse. Check it often. °· Rest for 48 hours after the procedure or as told by your health care provider. °· Avoid or limit your caffeine use as told by your health care provider. °Contact a health care provider if: °· You feel like your heart is beating too quickly or your pulse is not regular. °· You have a serious muscle cramp that does not go away. °Get help right away if: °· You have discomfort in your chest. °· You are dizzy or you feel faint. °· You have trouble breathing or you are short of breath. °· Your speech is slurred. °· You have trouble moving an arm or leg on one side of your body. °· Your fingers or toes turn cold or blue. °This information is not intended to replace advice given to you by your health care provider. Make sure you discuss any questions you have with your health care provider. °Document Released: 04/04/2013 Document Revised: 01/16/2016 Document Reviewed: 12/19/2015 °Elsevier Interactive Patient Education © 2018 [employer] ° °

## 2017-05-02 NOTE — CV Procedure (Signed)
     DIRECT CURRENT CARDIOVERSION  NAME:  [name]   MRN: [id number] DOB:  [birth year]   ADMIT DATE: 05/02/2017   INDICATIONS: Atrial fibrillation    PROCEDURE:   Informed consent was obtained prior to the procedure. The risks, benefits and alternatives for the procedure were discussed and the patient comprehended these risks. Once an appropriate time out was taken, the patient had the defibrillator pads placed in the anterior and posterior position. The patient then underwent sedation by the anesthesia service. Once an appropriate level of sedation was achieved, the patient received a single biphasic, synchronized 200J shock with prompt conversion to sinus rhythm. No apparent complications.   Arvilla MeresBensimhon, Zhamir Pirro, MD  9:36 AM

## 2017-05-02 NOTE — Transfer of Care (Signed)
Immediate Anesthesia Transfer of Care Note  Patient: [name]  Procedure(s) Performed: CARDIOVERSION (N/A )  Patient Location: PACU and Endoscopy Unit  Anesthesia Type:General  Level of Consciousness: awake, alert  and oriented  Airway & Oxygen Therapy: Patient connected to nasal cannula oxygen  Post-op Assessment: Post -op Vital signs reviewed and stable  Post vital signs: stable  Last Vitals:  Vitals:   05/02/17 0725  BP: (!) 120/56  Pulse: 86  Resp: 12  Temp: 36.7 C  SpO2: 97%    Last Pain:  Vitals:   05/02/17 0725  TempSrc: Oral         Complications: No apparent anesthesia complications

## 2017-05-02 NOTE — Anesthesia Postprocedure Evaluation (Signed)
Anesthesia Post Note  Patient: Don PerkingMary G Schiele  Procedure(s) Performed: CARDIOVERSION (N/A )     Patient location during evaluation: PACU Anesthesia Type: General Level of consciousness: awake and alert and oriented Pain management: pain level controlled Vital Signs Assessment: post-procedure vital signs reviewed and stable Respiratory status: spontaneous breathing, nonlabored ventilation and respiratory function stable Cardiovascular status: blood pressure returned to baseline and stable Postop Assessment: no apparent nausea or vomiting Anesthetic complications: no    Last Vitals:  Vitals:   05/02/17 0940 05/02/17 0950  BP: (!) 116/46 106/71  Pulse: (!) 54 (!) 52  Resp: 16 11  Temp:    SpO2: 98% 100%    Last Pain:  Vitals:   05/02/17 0925  TempSrc: Oral                 Emberly Tomasso A.

## 2017-05-03 ENCOUNTER — Telehealth (HOSPITAL_COMMUNITY): Payer: Self-pay | Admitting: *Deleted

## 2017-05-03 NOTE — Telephone Encounter (Signed)
Received medical record request from [facility].  Requested records faxed today to [phone].  Original request will be scanned to patient's electronic medical record.

## 2017-05-05 ENCOUNTER — Encounter (HOSPITAL_COMMUNITY): Payer: Self-pay | Admitting: Internal Medicine

## 2017-05-05 NOTE — Addendum Note (Signed)
Addendum  created 05/05/17 1314 by Adair LaundryPaxton, Ronnika Collett A, CRNA   Intraprocedure Event edited

## 2017-05-10 NOTE — Progress Notes (Signed)
Cardiology Office Note  Date: 05/11/2017   ID: [name] LoaMary G Lheureux, DOB 10/04/1945, MRN [id number]  PCP: Juliette AlcideBurdine, Steven E, MD  Primary Cardiologist: Nona DellSamuel Gilberto Stanforth, MD   Chief Complaint  Patient presents with  . Cardiac follow-up    History of Present Illness: [name] is a 71 y.o. female last seen in October.  Since I saw her she was hospitalized with increasing shortness of breath and chest discomfort associated with volume overload.  She underwent right and left heart catheterization demonstrating multivessel CAD with only 1 of 3 bypass grafts patent (SVG to PLA patent), increased filling pressures, and pulmonary hypertension.  PCI was not pursued and she was seen by the Heart Failure team for further optimization.  She was diuresed on IV Lasix and despite declining antiarrhythmic therapy with me consistently, she agreed to amiodarone and ultimately a TEE guided cardioversion which was successful.  She was seen in follow-up in the Heart Failure Clinic in late October, at that time found to be back in atrial flutter.  She was placed on Ranexa in addition to her standing medical therapy and plan for a follow-up TEE guided cardioversion which occurred on November 5 and was successful.  She did not tolerate Ranexa due to abdominal cramping and this was discontinued.  She presents today with her husband for follow-up.  States that overall she feels much better.  Less short of breath, currently NYHA class II.  No chest pain or palpitations.  She is bradycardic in sinus rhythm but not clearly symptomatic.  I reviewed her medications which are outlined below.  We discussed reducing amiodarone to 200 mg once daily.  Otherwise no changes anticipated at this time with stable weight.  He still has some lower extremity edema although this is improved significantly.  Also red discoloration and venous stasis involving her feet.  She has a slowly healing ulcer on her left foot with diabetes  mellitus, also history of PAD.  Past Medical History:  Diagnosis Date  . Asthma   . CAD (coronary artery disease)    Multivessel status post CABG in 2003  . Chronic combined systolic (congestive) and diastolic (congestive) heart failure (HCC) 12/25/2016  . Chronic systolic heart failure (HCC)   . Essential hypertension   . GERD (gastroesophageal reflux disease)   . Gum disease   . History of atrial flutter    [place]  . History of colonic polyps   . History of goiter   . Hyperlipidemia   . Hypothyroidism   . Ischemic cardiomyopathy 12/25/2016  . Myocardial infarction (HCC)    2003  . Peripheral arterial disease (HCC)    Stent revascularization 2015 - details not clear  . Renal insufficiency   . Type 2 diabetes mellitus (HCC)     Past Surgical History:  Procedure Laterality Date  . CHOLECYSTECTOMY     2012  . CORONARY ARTERY BYPASS GRAFT     2003  . PARATHYROIDECTOMY     2015  . THYROIDECTOMY    . TONSILLECTOMY     1967    Current Outpatient Medications  Medication Sig Dispense Refill  . acetaminophen (TYLENOL) 500 MG tablet Take 1,000 mg by mouth daily as needed for moderate pain.     Marland Kitchen. albuterol (PROVENTIL HFA;VENTOLIN HFA) 108 (90 Base) MCG/ACT inhaler Inhale 1 puff into the lungs every 6 (six) hours as needed for wheezing or shortness of breath.    Marland Kitchen. apixaban (ELIQUIS) 5 MG TABS tablet Take  1 tablet (5 mg total) by mouth 2 (two) times daily. 60 tablet 8  . carvedilol (COREG) 3.125 MG tablet Take 3.125 mg by mouth 2 (two) times daily.    . diphenhydramine-acetaminophen (TYLENOL PM) 25-500 MG TABS tablet Take 2 tablets by mouth at bedtime as needed (sleep).    . fluticasone (FLONASE) 50 MCG/ACT nasal spray Place 1 spray into both nostrils daily as needed for allergies or rhinitis.    Marland Kitchen. levothyroxine (SYNTHROID, LEVOTHROID) 175 MCG tablet Take 175 mcg by mouth daily before breakfast.    . losartan (COZAAR) 25 MG tablet Take 0.5 tablets (12.5 mg  total) by mouth daily. 30 tablet 6  . Magnesium 250 MG TABS Take 250 mg by mouth daily.     Marland Kitchen. neomycin-bacitracin-polymyxin (NEOSPORIN) ointment Apply 1 application as needed topically for wound care. apply to toe    . nitroGLYCERIN (NITRODUR - DOSED IN MG/24 HR) 0.2 mg/hr patch PLACE 1 PATCH ONTO THE SKIN DAILY AS NEEDED FOR CHEST PAINS 90 patch 3  . omeprazole (PRILOSEC) 40 MG capsule Take 40 mg by mouth daily.     . potassium chloride (K-DUR,KLOR-CON) 10 MEQ tablet Take 2 tablets (20 mEq total) by mouth 2 (two) times daily. 120 tablet 6  . spironolactone (ALDACTONE) 25 MG tablet Take 0.5 tablets (12.5 mg total) by mouth daily. 30 tablet 6  . torsemide (DEMADEX) 20 MG tablet Take 2 tablets (40 mg total) by mouth 2 (two) times daily. 120 tablet 6  . amiodarone (PACERONE) 200 MG tablet Take 1 tablet (200 mg total) daily by mouth. 90 tablet 3   No current facility-administered medications for this visit.    Allergies:  Oxycodone; Pollen extract; Ranexa [ranolazine]; and Statins   Social History: The patient  reports that she has quit smoking. Her smoking use included cigarettes. she has never used smokeless tobacco. She reports that she does not drink alcohol or use drugs.   ROS:  Please see the history of present illness. Otherwise, complete review of systems is positive for ankle edema.  All other systems are reviewed and negative.   Physical Exam: VS:  BP 128/68   Pulse (!) 48   Ht 5\' 3"  (1.6 m)   Wt 172 lb (78 kg)   SpO2 97%   BMI 30.47 kg/m , BMI Body mass index is 30.47 kg/m.  Wt Readings from Last 3 Encounters:  05/11/17 172 lb (78 kg)  05/02/17 171 lb 6.4 oz (77.7 kg)  04/22/17 171 lb 6.4 oz (77.7 kg)    General: Overweight woman, appears comfortable at rest. HEENT: Conjunctiva and lids normal, oropharynx clear. Neck: Supple, no elevated JVP or carotid bruits, no thyromegaly. Lungs: Clear to auscultation, nonlabored breathing at rest. Cardiac: Indistinct PMI, regular rate  and rhythm, no S3 or significant systolic murmur. Abdomen: Soft, nontender, bowel sounds present, no guarding or rebound. Extremities: 1+ lower leg edema.  Venous stasis and poor capillary refill in feet bilaterally, distal pulses diminished. Skin: Warm and dry. Musculoskeletal: No kyphosis. Neuropsychiatric: Alert and oriented x3, affect grossly appropriate.  ECG: I personally reviewed the tracing from 05/02/2017 which showed sinus bradycardia with prolonged PR interval, IVCD, poor R wave progression suggestive of old anterolateral infarct.  Recent Labwork: 12/24/2016: B Natriuretic Peptide 385.0 04/07/2017: Hemoglobin 11.2; Platelets 210 04/09/2017: ALT 23; AST 26; TSH 4.502 04/12/2017: Magnesium 2.1 04/22/2017: BUN 31; Creatinine, Ser 1.43; Potassium 3.8; Sodium 133     Component Value Date/Time   CHOL 153 04/09/2017 0331   TRIG  122 04/09/2017 0331   HDL 31 (L) 04/09/2017 0331   CHOLHDL 4.9 04/09/2017 0331   VLDL 24 04/09/2017 0331   LDLCALC 98 04/09/2017 0331    Other Studies Reviewed Today:  TEE 04/12/2017: Study Conclusions  - Left ventricle: Systolic function was severely reduced. The   estimated ejection fraction was in the range of 20% to 25%.   Diffuse hypokinesis. No evidence of thrombus. - Aortic valve: No evidence of vegetation. - Mitral valve: No evidence of vegetation. There was mild   regurgitation. - Left atrium: The atrium was moderately to severely dilated. No   evidence of thrombus in the atrial cavity or appendage. No   evidence of thrombus in the atrial cavity or appendage. - Right ventricle: The cavity size was dilated. Systolic function   was severely reduced. - Right atrium: The atrium was severely dilated. - Atrial septum: No defect or patent foramen ovale was identified. - Tricuspid valve: No evidence of vegetation. There was moderate   regurgitation.  Impressions:  - Successful cardioversion. No cardiac source of emboli was    indentified.  Assessment and Plan:  1.  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  Continue Coreg, Cozaar, Aldactone, Demadex, and potassium supplements.  She did not tolerate Ranexa.  She has declined ICD.  2.  Chronic combined heart failure.  Weight is stable, no changes made to current regimen.  She has follow-up in the Heart Failure Clinic in the next few weeks.  3.  Paroxysmal atrial flutter, maintaining sinus rhythm after second TEE guided cardioversion just recently.  Reduce amiodarone to 200 mg once daily, continue Eliquis.  She is bradycardic in sinus rhythm.  4.  Multivessel CAD status post CABG in 2003 with recently documented graft disease that is being managed medically.  5.  Mixed hyperlipidemia with history of statin intolerance, declines Zetia.  Recent LDL 98.  6.  History of PAD with previous stent revascularization in [place], details not clear.  Skin changes on feet concerning for progressive PAD.  Lower extremity arterial Dopplers and ABIs will be obtained.  Current medicines were reviewed with the patient today.    Orders Placed This Encounter  Procedures  . US ARTERIAL SEG MULTIPLE LE (ABI, SEGMENTAL PRESSURES, PVR'S)  . US ARTERIAL LOWER EXTREMITY DUPLEX BILATERAL  . Flu Vaccine QUAD 36+ mos IM    Disposition: Follow-up in 6 weeks.  Signed, Jonelle Sidle, MD, [facility] 05/11/2017 9:54 AM    [facility] at [facility] [address], [place], [place] [address] Phone: [phone]; Fax: [phone]

## 2017-05-11 ENCOUNTER — Other Ambulatory Visit: Payer: Self-pay | Admitting: Cardiology

## 2017-05-11 ENCOUNTER — Ambulatory Visit: Payer: Medicare Other | Admitting: Cardiology

## 2017-05-11 ENCOUNTER — Encounter: Payer: Self-pay | Admitting: Cardiology

## 2017-05-11 VITALS — BP 128/68 | HR 48 | Ht 63.0 in | Wt 172.0 lb

## 2017-05-11 DIAGNOSIS — I739 Peripheral vascular disease, unspecified: Secondary | ICD-10-CM

## 2017-05-11 DIAGNOSIS — I2510 Atherosclerotic heart disease of native coronary artery without angina pectoris: Secondary | ICD-10-CM | POA: Diagnosis not present

## 2017-05-11 DIAGNOSIS — I255 Ischemic cardiomyopathy: Secondary | ICD-10-CM

## 2017-05-11 DIAGNOSIS — Z789 Other specified health status: Secondary | ICD-10-CM

## 2017-05-11 DIAGNOSIS — I5042 Chronic combined systolic (congestive) and diastolic (congestive) heart failure: Secondary | ICD-10-CM | POA: Diagnosis not present

## 2017-05-11 DIAGNOSIS — Z23 Encounter for immunization: Secondary | ICD-10-CM

## 2017-05-11 DIAGNOSIS — I483 Typical atrial flutter: Secondary | ICD-10-CM

## 2017-05-11 MED ORDER — AMIODARONE HCL 200 MG PO TABS: 200.0000 mg | ORAL_TABLET | Freq: Every day | ORAL | 3 refills | Status: DC

## 2017-05-11 NOTE — Patient Instructions (Signed)
Medication Instructions:  DECREASE AMIODARONE TO 200 MG - ONE TIME DAILY   Labwork: NONE  Testing/Procedures: Your physician has requested that you have an ankle brachial index (ABI). During this test an ultrasound and blood pressure cuff are used to evaluate the arteries that supply the arms and legs with blood. Allow thirty minutes for this exam. There are no restrictions or special instructions.  Your physician has requested that you have a lower extremity arterial exercise duplex. During this test, exercise and ultrasound are used to evaluate arterial blood flow in the legs. Allow one hour for this exam. There are no restrictions or special instructions.   Follow-Up: Your physician recommends that you schedule a follow-up appointment in: 6 WEEKS    Any Other Special Instructions Will Be Listed Below (If Applicable).     If you need a refill on your cardiac medications before your next appointment, please call your pharmacy.

## 2017-05-13 ENCOUNTER — Telehealth: Payer: Self-pay

## 2017-05-13 ENCOUNTER — Ambulatory Visit (HOSPITAL_COMMUNITY)
Admission: RE | Admit: 2017-05-13 | Discharge: 2017-05-13 | Disposition: A | Discharge status: Home / Self Care | Payer: Medicare Other | Source: Ambulatory Visit | Attending: Cardiology | Admitting: Cardiology

## 2017-05-13 ENCOUNTER — Telehealth (HOSPITAL_COMMUNITY): Payer: Self-pay | Admitting: Pharmacist

## 2017-05-13 DIAGNOSIS — I739 Peripheral vascular disease, unspecified: Secondary | ICD-10-CM | POA: Diagnosis present

## 2017-05-13 DIAGNOSIS — Z87891 Personal history of nicotine dependence: Secondary | ICD-10-CM | POA: Insufficient documentation

## 2017-05-13 DIAGNOSIS — I10 Essential (primary) hypertension: Secondary | ICD-10-CM | POA: Diagnosis not present

## 2017-05-13 DIAGNOSIS — E1151 Type 2 diabetes mellitus with diabetic peripheral angiopathy without gangrene: Secondary | ICD-10-CM | POA: Insufficient documentation

## 2017-05-13 DIAGNOSIS — I2510 Atherosclerotic heart disease of native coronary artery without angina pectoris: Secondary | ICD-10-CM | POA: Diagnosis not present

## 2017-05-13 NOTE — Telephone Encounter (Signed)
Torsemide PA approved by [employer] through 05/12/18.   Tyler DeisErika K. Bonnye FavaNicolsen, PharmD, BCPS, CPP Clinical Pharmacist Pager: [phone] Phone: [phone] 05/13/2017 12:15 PM

## 2017-05-13 NOTE — Telephone Encounter (Signed)
-----   Message from Jonelle SidleSamuel G McDowell, MD sent at 05/13/2017  4:10 PM EST ----- Results reviewed.  Dopplers consistent with PAD, please make referral to our vascular team (Dr. Allyson SabalBerry or Dr. Kirke CorinArida). A copy of this test should be forwarded to Burdine, Ananias PilgrimSteven E, MD.

## 2017-05-13 NOTE — Telephone Encounter (Signed)
Lmtcb, ref placed to Dr Russella DarBerry-cc

## 2017-05-13 NOTE — Telephone Encounter (Signed)
Patient notified of results and that referral was place to Dr [facility]Berry at [facility]

## 2017-05-17 ENCOUNTER — Telehealth: Payer: Self-pay | Admitting: Cardiology

## 2017-05-17 NOTE — Telephone Encounter (Signed)
Pt notified that referral has been placed with Dr. Allyson SabalBerry and his office will call with an appt.

## 2017-05-17 NOTE — Telephone Encounter (Signed)
Please return pts call

## 2017-05-18 NOTE — Telephone Encounter (Signed)
Received call from Dr.Burdine requesting that pt been seen by Dr. Allyson SabalBerry ASAP. Dr. Leandrew KoyanagiBurdine reports that pt's foot is red and 3 toe is blue. [facility]. Appt made for Tues. Nov. 27 @ 10am. Dr. Leandrew KoyanagiBurdine office note faxed to [facility].

## 2017-05-24 ENCOUNTER — Ambulatory Visit (HOSPITAL_BASED_OUTPATIENT_CLINIC_OR_DEPARTMENT_OTHER)
Admission: RE | Admit: 2017-05-24 | Discharge: 2017-05-24 | Disposition: A | Discharge status: Home / Self Care | Payer: Medicare Other | Source: Ambulatory Visit | Attending: Cardiovascular Disease | Admitting: Cardiovascular Disease

## 2017-05-24 ENCOUNTER — Other Ambulatory Visit: Payer: Self-pay | Admitting: Cardiovascular Disease

## 2017-05-24 ENCOUNTER — Ambulatory Visit: Payer: Medicare Other | Admitting: Cardiovascular Disease

## 2017-05-24 ENCOUNTER — Encounter: Payer: Self-pay | Admitting: Cardiovascular Disease

## 2017-05-24 VITALS — BP 128/60 | HR 92 | Ht 63.0 in | Wt 171.2 lb

## 2017-05-24 DIAGNOSIS — I739 Peripheral vascular disease, unspecified: Secondary | ICD-10-CM

## 2017-05-24 DIAGNOSIS — E1151 Type 2 diabetes mellitus with diabetic peripheral angiopathy without gangrene: Secondary | ICD-10-CM | POA: Insufficient documentation

## 2017-05-24 DIAGNOSIS — I10 Essential (primary) hypertension: Secondary | ICD-10-CM

## 2017-05-24 DIAGNOSIS — I998 Other disorder of circulatory system: Secondary | ICD-10-CM | POA: Diagnosis not present

## 2017-05-24 DIAGNOSIS — E785 Hyperlipidemia, unspecified: Secondary | ICD-10-CM | POA: Insufficient documentation

## 2017-05-24 DIAGNOSIS — I2510 Atherosclerotic heart disease of native coronary artery without angina pectoris: Secondary | ICD-10-CM | POA: Insufficient documentation

## 2017-05-24 DIAGNOSIS — Z87891 Personal history of nicotine dependence: Secondary | ICD-10-CM

## 2017-05-24 DIAGNOSIS — I480 Paroxysmal atrial fibrillation: Secondary | ICD-10-CM

## 2017-05-24 DIAGNOSIS — I70229 Atherosclerosis of native arteries of extremities with rest pain, unspecified extremity: Secondary | ICD-10-CM | POA: Insufficient documentation

## 2017-05-24 MED ORDER — ENOXAPARIN SODIUM 80 MG/0.8ML ~~LOC~~ SOLN: 80.0000 mg | Freq: Two times a day (BID) | SUBCUTANEOUS | 0 refills | Status: DC

## 2017-05-24 NOTE — Patient Instructions (Signed)
   [facility] [address] [place] [address] Dept: [phone] Loc: [phone]  [name]  05/24/2017  You are scheduled for a Peripheral Angiogram on Thursday, November 29 with Dr. Nanetta BattyJonathan [name].  1. Please [facility]: [address] [place], [place] [address] at 7:30 AM (two hours before your procedure to ensure your preparation). Free valet parking service is available.   Special note: Every effort is made to have your procedure done on time. Please understand that emergencies sometimes delay scheduled procedures.  2. Diet: Do not eat or drink anything after midnight prior to your procedure except sips of water to take medications.  3. Labs: Please have labs drawn today.  4. Medication instructions in preparation for your procedure:  Stop taking Eliquis today.  Inject 1 syringe of Enoxaprin Sodium (Lovenox) 80 mg every 12 hours. In ject 1 syringe this evening and 1 syringe in the morning.   On the morning of your procedure, take any morning medicines NOT listed above.  You may use sips of water.  5. Plan for one night stay--bring personal belongings. 6. Bring a current list of your medications and current insurance cards. 7. You MUST have a responsible person to drive you home. 8. Someone MUST be with you the first 24 hours after you arrive home or your discharge will be delayed. 9. Please wear clothes that are easy to get on and off and wear slip-on shoes.  Thank you for allowing us to care for you!   -- [facility]  Post procedure follow-up:  1 WEEK AFTER PROCEDURE: Your physician has requested that you have a lower extremity arterial duplex. During this test, ultrasound is used to evaluate arterial blood flow in the legs. Allow one hour for this exam. There are no restrictions or  special instructions.  Your physician has requested that you have an ankle brachial index (ABI). During this test an ultrasound and blood pressure cuff are used to evaluate the arteries that supply the arms and legs with blood. Allow thirty minutes for this exam. There are no restrictions or special instructions.   Your physician recommends that you schedule a follow-up appointment in: 2 weeks after procedure with Dr. Allyson [name].

## 2017-05-24 NOTE — Assessment & Plan Note (Signed)
Ms. [name] was referred to me by Dr. Diona BrownerMcDowell and Burdine for critical limb ischemia. She has a history of ischemic cardiomyopathy with EF in the 20% range 13 years status post bypass grafting. She has chronic A. Fib recently undergoing DC cardioversion 1 week ago currently back in A. Fib. She noticed pain in her right foot last Tuesday which got worse on Wednesday with the development of painful red right toes. She had Dopplers performed that showed monophasic waveforms in the right popliteal artery. I'm concerned that she may have had an embolic event on Eliquis .i'm going to get lower extremity arterial Doppler studies today to further evaluate the location and extent of her right lower exam he arterial obstructive disease, stop her Eliquis and begin  Lovenox. I'll arrange for her to undergo angiography by myself on Thursday.

## 2017-05-24 NOTE — Progress Notes (Signed)
05/24/2017 [name]   [birth year]  [id number]  Primary Physician Burdine, Ananias PilgrimSteven E, MD Primary Cardiologist: Runell GessJonathan J Treveon Bourcier MD [name], FACC, FAHA, [place]  HPI:  [name] is a 71 y.o. female married Caucasian female mother of 3, grandmother of a grandchildren referred to me by Dr. Diona [name] and Burdine for evaluation and treatment of critical limb ischemia. She is a retired professor of Academic librariannglish composition. She has ischemic cardiomyopathy status post bypass grafting injection of [place] present 14 years ago after a myocardial infarction. She has an EF in the 20-25% range with occluded vein grafts, chronic A. Fib on oral anticoagulation. She had outpatient DC cardioversion by Dr. Teressa [name] one week ago and is currently back in A. Fib on amiodarone and Eliquis . She developed pain in her right foot last Wednesday which progressed to redness. She is currently taking nonsteroidals every 4 hours for pain relief. Dopplers performed showed monophasic waveforms in her right popliteal artery.   Current Meds  Medication Sig  . acetaminophen (TYLENOL) 500 MG tablet Take 1,000 mg by mouth daily as needed for moderate pain.   Marland Kitchen. albuterol (PROVENTIL HFA;VENTOLIN HFA) 108 (90 Base) MCG/ACT inhaler Inhale 1 puff into the lungs every 6 (six) hours as needed for wheezing or shortness of breath.  Marland Kitchen. amiodarone (PACERONE) 200 MG tablet Take 1 tablet (200 mg total) daily by mouth.  Marland Kitchen. apixaban (ELIQUIS) 5 MG TABS tablet Take 1 tablet (5 mg total) by mouth 2 (two) times daily.  . carvedilol (COREG) 3.125 MG tablet Take 3.125 mg by mouth 2 (two) times daily.  . diphenhydramine-acetaminophen (TYLENOL PM) 25-500 MG TABS tablet Take 2 tablets by mouth at bedtime as needed (sleep).  . fluticasone (FLONASE) 50 MCG/ACT nasal spray Place 1 spray into both nostrils daily as needed for allergies or rhinitis.  Marland Kitchen. levothyroxine (SYNTHROID, LEVOTHROID) 175 MCG tablet Take 175 mcg by mouth daily before breakfast.  .  losartan (COZAAR) 25 MG tablet Take 0.5 tablets (12.5 mg total) by mouth daily.  . Magnesium 250 MG TABS Take 250 mg by mouth daily.   Marland Kitchen. neomycin-bacitracin-polymyxin (NEOSPORIN) ointment Apply 1 application as needed topically for wound care. apply to toe  . nitroGLYCERIN (NITRODUR - DOSED IN MG/24 HR) 0.2 mg/hr patch PLACE 1 PATCH ONTO THE SKIN DAILY AS NEEDED FOR CHEST PAINS  . potassium chloride (K-DUR,KLOR-CON) 10 MEQ tablet Take 2 tablets (20 mEq total) by mouth 2 (two) times daily.  Marland Kitchen. spironolactone (ALDACTONE) 25 MG tablet Take 0.5 tablets (12.5 mg total) by mouth daily.  Marland Kitchen. sulfamethoxazole-trimethoprim (BACTRIM DS,SEPTRA DS) 800-160 MG tablet Take 1 tablet by mouth 2 (two) times daily.  Marland Kitchen. torsemide (DEMADEX) 20 MG tablet Take 2 tablets (40 mg total) by mouth 2 (two) times daily.     Allergies  Allergen Reactions  . Oxycodone Other (See Comments)    hallucinations   . Pollen Extract Other (See Comments)    Sneezing, running nose.  . Ranexa [Ranolazine] Nausea Only    Headache and nausea  . Statins Other (See Comments)    Cramping, turns skin yellow    Social History   Socioeconomic History  . Marital status: Married    Spouse name: Not on file  . Number of children: Not on file  . Years of education: Not on file  . Highest education level: Not on file  Social Needs  . Financial resource strain: Not on file  . Food insecurity - worry: Not on file  .  Food insecurity - inability: Not on file  . Transportation needs - medical: Not on file  . Transportation needs - non-medical: Not on file  Occupational History  . Not on file  Tobacco Use  . Smoking status: Former Smoker    Types: Cigarettes  . Smokeless tobacco: Never Used  Substance and Sexual Activity  . Alcohol use: No  . Drug use: No  . Sexual activity: Not on file  Other Topics Concern  . Not on file  Social History Narrative  . Not on file     Review of Systems: General: negative for chills, fever, night  sweats or weight changes.  Cardiovascular: negative for chest pain, dyspnea on exertion, edema, orthopnea, palpitations, paroxysmal nocturnal dyspnea or shortness of breath Dermatological: negative for rash Respiratory: negative for cough or wheezing Urologic: negative for hematuria Abdominal: negative for nausea, vomiting, diarrhea, bright red blood per rectum, melena, or hematemesis Neurologic: negative for visual changes, syncope, or dizziness All other systems reviewed and are otherwise negative except as noted above.    Blood pressure 128/60, pulse 92, height 5\' 3"  (1.6 m), weight 171 lb 3.2 oz (77.7 kg).  General appearance: alert and no distress Neck: no adenopathy, no carotid bruit, no JVD, supple, symmetrical, trachea midline and thyroid not enlarged, symmetric, no tenderness/mass/nodules Lungs: clear to auscultation bilaterally Heart: irregularly irregular rhythm Extremities: reddish discoloration right foot greater than left Pulses: absent pedal pulse on the right, 1+ pedal pulse on the left Skin: reddish discoloration on her feet right greater than left Neurologic: Alert and oriented X 3, normal strength and tone. Normal symmetric reflexes. Normal coordination and gait  EKG A. Fib with a ventricular response of 92, left axis deviationand nonspecific ST and T-wave changes. I personally reviewed this EKG.  ASSESSMENT AND PLAN:   Critical lower limb ischemia [name] was referred to me by Dr. Diona [name] and Burdine for critical limb ischemia. She has a history of ischemic cardiomyopathy with EF in the 20% range 13 years status post bypass grafting. She has chronic A. Fib recently undergoing DC cardioversion 1 week ago currently back in A. Fib. She noticed pain in her right foot last Tuesday which got worse on Wednesday with the development of painful red right toes. She had Dopplers performed that showed monophasic waveforms in the right popliteal artery. I'm concerned that she  may have had an embolic event on Eliquis .i'm going to get lower extremity arterial Doppler studies today to further evaluate the location and extent of her right lower exam he arterial obstructive disease, stop her Eliquis and begin  Lovenox. I'll arrange for her to undergo angiography by myself on Thursday.      Runell GessJonathan J. Inola Lisle MD FACP,FACC,FAHA, [facility] 05/24/2017 10:48 AM

## 2017-05-25 ENCOUNTER — Telehealth: Payer: Self-pay | Admitting: Cardiovascular Disease

## 2017-05-25 LAB — CBC WITH DIFFERENTIAL/PLATELET
BASOS: 1 %
Basophils Absolute: 0.1 10*3/uL (ref 0.0–0.2)
EOS (ABSOLUTE): 0.1 10*3/uL (ref 0.0–0.4)
EOS: 1 %
HEMATOCRIT: 39.4 % (ref 34.0–46.6)
Hemoglobin: 13 g/dL (ref 11.1–15.9)
IMMATURE GRANULOCYTES: 1 %
Immature Grans (Abs): 0.1 10*3/uL (ref 0.0–0.1)
LYMPHS ABS: 1.5 10*3/uL (ref 0.7–3.1)
Lymphs: 16 %
MCH: 29.1 pg (ref 26.6–33.0)
MCHC: 33 g/dL (ref 31.5–35.7)
MCV: 88 fL (ref 79–97)
MONOS ABS: 0.8 10*3/uL (ref 0.1–0.9)
Monocytes: 9 %
NEUTROS PCT: 72 %
Neutrophils Absolute: 7.2 10*3/uL — ABNORMAL HIGH (ref 1.4–7.0)
PLATELETS: 339 10*3/uL (ref 150–379)
RBC: 4.46 x10E6/uL (ref 3.77–5.28)
RDW: 18.2 % — AB (ref 12.3–15.4)
WBC: 9.7 10*3/uL (ref 3.4–10.8)

## 2017-05-25 LAB — BASIC METABOLIC PANEL
BUN/Creatinine Ratio: 22 (ref 12–28)
BUN: 55 mg/dL — AB (ref 8–27)
CALCIUM: 9.8 mg/dL (ref 8.7–10.3)
CHLORIDE: 101 mmol/L (ref 96–106)
CO2: 18 mmol/L — AB (ref 20–29)
CREATININE: 2.45 mg/dL — AB (ref 0.57–1.00)
GFR calc Af Amer: 22 mL/min/{1.73_m2} — ABNORMAL LOW (ref >59)
GFR calc non Af Amer: 19 mL/min/{1.73_m2} — ABNORMAL LOW (ref >59)
GLUCOSE: 93 mg/dL (ref 65–99)
Potassium: 4.9 mmol/L (ref 3.5–5.2)
Sodium: 139 mmol/L (ref 134–144)

## 2017-05-25 LAB — PROTIME-INR
INR: 1.2 (ref 0.8–1.2)
Prothrombin Time: 12.6 s — ABNORMAL HIGH (ref 9.1–12.0)

## 2017-05-25 LAB — APTT: APTT: 34 s — AB (ref 24–33)

## 2017-05-25 NOTE — Telephone Encounter (Signed)
Spoke to [place]Sean [facility]. He stated there are still about 15 patients waiting for a bed and they would not have a bed avaialble for pt until tomorrow afternoon. Pt could also go through ED, but not sure how long pt will wait due to no beds available.   Informed pt. Pt stated she would like to wait until tomorrow, she will await call from bed control. Bed control is aware.  Informed Dr. Allyson SabalBerry of this as well. Routing this message to him.

## 2017-05-25 NOTE — Telephone Encounter (Signed)
Called pt. She is aware bed control will be calling her when bed available and pt will be admitted and has been rescheduled to have procedure on Monday, 12/3.

## 2017-05-25 NOTE — Telephone Encounter (Signed)
-----   Message from Jonathan J Berry, MD sent at 05/25/2017  9:04 AM EST ----- Progressive increase in serum creatinine in the last month. This precludes angiography tomorrow. I recommend bringing her in an transitioning her from oral anticoagulation to IV heparin. I would also discontinue her diuretics and gently hydrate her anticipation for angiogram on Monday. 

## 2017-05-25 NOTE — Telephone Encounter (Signed)
Message sent to Salley Hewsatricia Trent to figure out process for pt to be admitted to hospital today. Pt to possibly have CATH prior to Monday. Will call pt when I hear back from Trish.

## 2017-05-25 NOTE — Telephone Encounter (Signed)
Returned the call to the patient. She stated that she is waiting on bed control to call and place her today. She needs to get a ride to the hospital so would like to know when it will be. She has been advised that Dr. Hazle CocaBerry's assistant is working on it and will call when she knows something.

## 2017-05-25 NOTE — Telephone Encounter (Signed)
New message  Pt verbalized that she is calling for the rn   She is still waiting on a bed to come available and that she is depending on transportation

## 2017-05-25 NOTE — Telephone Encounter (Signed)
PV Angio--bilateral

## 2017-05-26 ENCOUNTER — Telehealth: Payer: Self-pay | Admitting: Cardiovascular Disease

## 2017-05-26 ENCOUNTER — Inpatient Hospital Stay (HOSPITAL_COMMUNITY)
Admission: AD | Admit: 2017-05-26 | Discharge: 2017-05-31 | DRG: 271 | Disposition: A | Discharge status: Home / Self Care | Payer: Medicare Other | Source: Ambulatory Visit | Attending: Cardiovascular Disease | Admitting: Cardiovascular Disease

## 2017-05-26 DIAGNOSIS — E1122 Type 2 diabetes mellitus with diabetic chronic kidney disease: Secondary | ICD-10-CM | POA: Diagnosis present

## 2017-05-26 DIAGNOSIS — Z8249 Family history of ischemic heart disease and other diseases of the circulatory system: Secondary | ICD-10-CM

## 2017-05-26 DIAGNOSIS — N183 Chronic kidney disease, stage 3 (moderate): Secondary | ICD-10-CM | POA: Diagnosis not present

## 2017-05-26 DIAGNOSIS — Z951 Presence of aortocoronary bypass graft: Secondary | ICD-10-CM

## 2017-05-26 DIAGNOSIS — I5042 Chronic combined systolic (congestive) and diastolic (congestive) heart failure: Secondary | ICD-10-CM | POA: Diagnosis present

## 2017-05-26 DIAGNOSIS — I252 Old myocardial infarction: Secondary | ICD-10-CM

## 2017-05-26 DIAGNOSIS — E1151 Type 2 diabetes mellitus with diabetic peripheral angiopathy without gangrene: Secondary | ICD-10-CM | POA: Diagnosis present

## 2017-05-26 DIAGNOSIS — N171 Acute kidney failure with acute cortical necrosis: Secondary | ICD-10-CM | POA: Diagnosis not present

## 2017-05-26 DIAGNOSIS — E785 Hyperlipidemia, unspecified: Secondary | ICD-10-CM | POA: Diagnosis present

## 2017-05-26 DIAGNOSIS — K219 Gastro-esophageal reflux disease without esophagitis: Secondary | ICD-10-CM | POA: Diagnosis present

## 2017-05-26 DIAGNOSIS — I5022 Chronic systolic (congestive) heart failure: Secondary | ICD-10-CM | POA: Diagnosis not present

## 2017-05-26 DIAGNOSIS — I480 Paroxysmal atrial fibrillation: Secondary | ICD-10-CM | POA: Diagnosis present

## 2017-05-26 DIAGNOSIS — J45909 Unspecified asthma, uncomplicated: Secondary | ICD-10-CM | POA: Diagnosis present

## 2017-05-26 DIAGNOSIS — Z79890 Hormone replacement therapy (postmenopausal): Secondary | ICD-10-CM | POA: Diagnosis not present

## 2017-05-26 DIAGNOSIS — I2510 Atherosclerotic heart disease of native coronary artery without angina pectoris: Secondary | ICD-10-CM | POA: Diagnosis present

## 2017-05-26 DIAGNOSIS — I272 Pulmonary hypertension, unspecified: Secondary | ICD-10-CM | POA: Diagnosis present

## 2017-05-26 DIAGNOSIS — I70211 Atherosclerosis of native arteries of extremities with intermittent claudication, right leg: Secondary | ICD-10-CM | POA: Diagnosis not present

## 2017-05-26 DIAGNOSIS — I70229 Atherosclerosis of native arteries of extremities with rest pain, unspecified extremity: Secondary | ICD-10-CM

## 2017-05-26 DIAGNOSIS — I998 Other disorder of circulatory system: Secondary | ICD-10-CM | POA: Diagnosis present

## 2017-05-26 DIAGNOSIS — Z7901 Long term (current) use of anticoagulants: Secondary | ICD-10-CM | POA: Diagnosis not present

## 2017-05-26 DIAGNOSIS — I739 Peripheral vascular disease, unspecified: Secondary | ICD-10-CM | POA: Diagnosis not present

## 2017-05-26 DIAGNOSIS — I481 Persistent atrial fibrillation: Secondary | ICD-10-CM | POA: Diagnosis present

## 2017-05-26 DIAGNOSIS — M79671 Pain in right foot: Secondary | ICD-10-CM | POA: Diagnosis present

## 2017-05-26 DIAGNOSIS — I25708 Atherosclerosis of coronary artery bypass graft(s), unspecified, with other forms of angina pectoris: Secondary | ICD-10-CM | POA: Diagnosis not present

## 2017-05-26 DIAGNOSIS — N179 Acute kidney failure, unspecified: Secondary | ICD-10-CM | POA: Diagnosis present

## 2017-05-26 DIAGNOSIS — T502X5A Adverse effect of carbonic-anhydrase inhibitors, benzothiadiazides and other diuretics, initial encounter: Secondary | ICD-10-CM | POA: Diagnosis present

## 2017-05-26 DIAGNOSIS — I25810 Atherosclerosis of coronary artery bypass graft(s) without angina pectoris: Secondary | ICD-10-CM | POA: Diagnosis not present

## 2017-05-26 DIAGNOSIS — Z794 Long term (current) use of insulin: Secondary | ICD-10-CM | POA: Diagnosis not present

## 2017-05-26 DIAGNOSIS — Z87891 Personal history of nicotine dependence: Secondary | ICD-10-CM

## 2017-05-26 DIAGNOSIS — I255 Ischemic cardiomyopathy: Secondary | ICD-10-CM | POA: Diagnosis present

## 2017-05-26 DIAGNOSIS — E890 Postprocedural hypothyroidism: Secondary | ICD-10-CM | POA: Diagnosis present

## 2017-05-26 DIAGNOSIS — I130 Hypertensive heart and chronic kidney disease with heart failure and stage 1 through stage 4 chronic kidney disease, or unspecified chronic kidney disease: Secondary | ICD-10-CM | POA: Diagnosis present

## 2017-05-26 MED ORDER — HEPARIN (PORCINE) IN NACL 100-0.45 UNIT/ML-% IJ SOLN
1150.0000 [IU]/h | INTRAMUSCULAR | Status: DC
  Administered 2017-05-26 – 2017-05-27 (×2): 1050 [IU]/h via INTRAVENOUS
  Filled 2017-05-26 (×4): qty 250

## 2017-05-26 MED ORDER — ONDANSETRON HCL 4 MG/2ML IJ SOLN
4.0000 mg | Freq: Four times a day (QID) | INTRAMUSCULAR | Status: DC | PRN
  Administered 2017-05-29 (×3): 4 mg via INTRAVENOUS
  Filled 2017-05-26 (×3): qty 2

## 2017-05-26 MED ORDER — MAGNESIUM OXIDE 400 (241.3 MG) MG PO TABS
200.0000 mg | ORAL_TABLET | Freq: Every day | ORAL | Status: DC
  Administered 2017-05-27 – 2017-05-31 (×5): 200 mg via ORAL
  Filled 2017-05-26 (×6): qty 1

## 2017-05-26 MED ORDER — DIPHENHYDRAMINE HCL 25 MG PO CAPS
50.0000 mg | ORAL_CAPSULE | Freq: Every evening | ORAL | Status: DC | PRN
  Administered 2017-05-26 – 2017-05-30 (×5): 50 mg via ORAL
  Filled 2017-05-26 (×5): qty 2

## 2017-05-26 MED ORDER — ALPRAZOLAM 0.25 MG PO TABS: 0.2500 mg | ORAL_TABLET | Freq: Every evening | ORAL | Status: DC | PRN

## 2017-05-26 MED ORDER — ACETAMINOPHEN 500 MG PO TABS
1000.0000 mg | ORAL_TABLET | Freq: Every day | ORAL | Status: DC | PRN
  Administered 2017-05-27 – 2017-05-28 (×2): 1000 mg via ORAL
  Filled 2017-05-26 (×3): qty 2

## 2017-05-26 MED ORDER — OXYMETAZOLINE HCL 0.05 % NA SOLN
1.0000 | Freq: Every day | NASAL | Status: DC
  Filled 2017-05-26: qty 15

## 2017-05-26 MED ORDER — ACETAMINOPHEN 500 MG PO TABS
1000.0000 mg | ORAL_TABLET | Freq: Every evening | ORAL | Status: DC | PRN
  Administered 2017-05-26 – 2017-05-30 (×5): 1000 mg via ORAL
  Filled 2017-05-26 (×4): qty 2

## 2017-05-26 MED ORDER — AMIODARONE HCL 200 MG PO TABS
200.0000 mg | ORAL_TABLET | Freq: Every day | ORAL | Status: DC
  Administered 2017-05-27 – 2017-05-31 (×5): 200 mg via ORAL
  Filled 2017-05-26 (×5): qty 1

## 2017-05-26 MED ORDER — ALBUTEROL SULFATE (2.5 MG/3ML) 0.083% IN NEBU: 2.5000 mg | INHALATION_SOLUTION | Freq: Four times a day (QID) | RESPIRATORY_TRACT | Status: DC | PRN

## 2017-05-26 MED ORDER — TRAMADOL HCL 50 MG PO TABS
50.0000 mg | ORAL_TABLET | Freq: Two times a day (BID) | ORAL | Status: DC | PRN
  Administered 2017-05-26 – 2017-05-30 (×3): 50 mg via ORAL
  Filled 2017-05-26 (×3): qty 1

## 2017-05-26 MED ORDER — CARVEDILOL 3.125 MG PO TABS
3.1250 mg | ORAL_TABLET | Freq: Two times a day (BID) | ORAL | Status: DC
  Administered 2017-05-26 – 2017-05-31 (×10): 3.125 mg via ORAL
  Filled 2017-05-26 (×10): qty 1

## 2017-05-26 MED ORDER — LEVOTHYROXINE SODIUM 75 MCG PO TABS
175.0000 ug | ORAL_TABLET | Freq: Every day | ORAL | Status: DC
  Administered 2017-05-27 – 2017-05-31 (×5): 175 ug via ORAL
  Filled 2017-05-26 (×5): qty 1

## 2017-05-26 MED ORDER — SODIUM CHLORIDE 0.9 % IV SOLN
INTRAVENOUS | Status: DC
  Administered 2017-05-26 – 2017-05-28 (×2): via INTRAVENOUS

## 2017-05-26 MED ORDER — DIPHENHYDRAMINE-APAP (SLEEP) 25-500 MG PO TABS: 2.0000 | ORAL_TABLET | Freq: Every evening | ORAL | Status: DC | PRN

## 2017-05-26 MED ORDER — NITROGLYCERIN 0.4 MG SL SUBL: 0.4000 mg | SUBLINGUAL_TABLET | SUBLINGUAL | Status: DC | PRN

## 2017-05-26 MED ORDER — ALBUTEROL SULFATE HFA 108 (90 BASE) MCG/ACT IN AERS: 2.0000 | INHALATION_SPRAY | Freq: Four times a day (QID) | RESPIRATORY_TRACT | Status: DC | PRN

## 2017-05-26 NOTE — Telephone Encounter (Deleted)
-----   Message from Runell GessJonathan J Berry, MD sent at 05/25/2017  9:04 AM EST ----- Progressive increase in serum creatinine in the last month. This precludes angiography tomorrow. I recommend bringing her in an transitioning her from oral anticoagulation to IV heparin. I would also discontinue her diuretics and gently hydrate her anticipation for angiogram on Monday.

## 2017-05-26 NOTE — Progress Notes (Signed)
ANTICOAGULATION CONSULT NOTE - Initial Consult  Pharmacy Consult for heparin Indication: atrial fibrillation  Allergies  Allergen Reactions  . Oxycodone Other (See Comments)    hallucinations   . Pollen Extract Other (See Comments)    Sneezing, running nose.  . Ranexa [Ranolazine] Nausea Only    Headache and nausea  . Statins Other (See Comments)    Cramping, turns skin yellow    Patient Measurements: Height: 5\' 3"  (160 cm) Weight: 172 lb 3.2 oz (78.1 kg) IBW/kg (Calculated) : 52.4 Heparin Dosing Weight: 70 kg  Vital Signs: Temp: 98 F (36.7 C) (11/29 1627) Temp Source: Oral (11/29 1627) BP: 107/71 (11/29 1627)  Labs: Recent Labs    05/24/17 1239  HGB 13.0  HCT 39.4  PLT 339  APTT 34*  LABPROT 12.6*  INR 1.2  CREATININE 2.45*    Estimated Creatinine Clearance: 21.1 mL/min (A) (by C-G formula based on SCr of 2.45 mg/dL (H)).  Assessment: CC/HPI: 71 yo f presenting with r foot pain - pending LE angiography Mon  PMH: CAD CABG, ICM, HTN, HLD< DM, AFib on apixaban, PAD  Anticoag: apixaban pta for afib - holding pending angiography to begin iv hep  Last apixaban dose - 11/27 am   Renal: SCr 2.45 on 11/27 (was 1.4 recently)  Heme/Onc: H&H 13/39.4, Plt 339 (11/27)  Goal of Therapy:  aPTT 66 - 102 seconds Monitor platelets by anticoagulation protocol: Yes   Plan:  No bolus d/t recent apixaban Heparin gtt 1050 units/hr APTT HL 0100 Daily HL CBC aPTT  Isaac BlissMichael Lequisha Cammack, PharmD, BCPS, BCCCP Clinical Pharmacist Clinical phone for 05/26/2017 from 7a-3:30p: [phone] If after 3:30p, please call main pharmacy at: [phone] 05/26/2017 6:31 PM

## 2017-05-26 NOTE — Telephone Encounter (Signed)
[name] is calling because she still has not heard from the hospital . Was supposed to be admitted on yesterday . Also she still does not know what to do about her medication . Please call

## 2017-05-26 NOTE — Telephone Encounter (Signed)
Spoke with Bed Control. Pt has been made aware that bed is available for her.

## 2017-05-26 NOTE — H&P (Signed)
Cardiology Admission History and Physical:   Patient ID: [name]; MRN: [id number]; DOB: [birth year]   Admission date: 05/26/2017  Primary Care Provider: Juliette Alcide, MD Primary Cardiologist: Dr. Diona Browner / Dr. Allyson Sabal (PV) Primary Electrophysiologist:  n/a  Chief Complaint:  R foot pain  Patient Profile:   [name] is a 71 y.o. female with a history of CAD s/p CABG 2003, ICM with baseline EF 25-30%, HTN, HLD, DM II, PAF on eliquis, and PAD admitted for AKI and IV hydration prior to LE angiography next Monday for critical R lower limb ischemia.   History of Present Illness:   [name] is a 71 y.o. female with a history of CAD s/p CABG 2003, ICM with baseline EF 25-30%, HTN, HLD, DM II, PAF on eliquis, and PAD.  Her only patent graft is SVG to PLA, this was seen on recent cardiac catheterization on 04/08/2017.  She has severe LV dysfunction.  She has turned down ICD in the past.  She also have difficult to control atrial fibrillation.  She has undergone multiple cardioversions in the past.  More recently, she has been having significant pain in her right foot.  This has progressed to redness.  Lower extremity arterial Doppler showed monophasic waveforms in her right popliteal artery.  According to patient, she actually have to dangle her right leg on the side of the bed in order to obtain some relief.  Her symptom is consistent with critical right lower limb ischemia.  Although lower extremity angiography was initially scheduled, however recent basic metabolic panel showed her creatinine has jumped from the previous 1.4 a month ago up to 2.4.  Patient presents today for soft fluid hydration.  I plan to hold her Eliquis, transition her to IV heparin.  I will also hold her diuretic as well.  I will initially run the IV fluid at 50 mL/h overnight.  If creatinine improved tomorrow, may need to consider holding further IV fluid until Sunday morning to avoid significant volume  overload.   Past Medical History:  Diagnosis Date  . Asthma   . CAD (coronary artery disease)    Multivessel status post CABG in 2003  . Chronic combined systolic (congestive) and diastolic (congestive) heart failure (HCC) 12/25/2016  . Chronic systolic heart failure (HCC)   . Essential hypertension   . GERD (gastroesophageal reflux disease)   . Gum disease   . History of atrial flutter    Cardioversion 2016 in [place]  . History of colonic polyps   . History of goiter   . Hyperlipidemia   . Hypothyroidism   . Ischemic cardiomyopathy 12/25/2016  . Myocardial infarction (HCC)    2003  . Peripheral arterial disease (HCC)    Stent revascularization 2015 - details not clear  . Renal insufficiency   . Type 2 diabetes mellitus (HCC)     Past Surgical History:  Procedure Laterality Date  . CARDIOVERSION N/A 04/12/2017   Procedure: CARDIOVERSION;  Surgeon: Bensimhon, Daniel R, MD;  Location: [facility] ENDOSCOPY;  Service: Cardiovascular;  Laterality: N/A;  . CARDIOVERSION N/A 05/02/2017   Procedure: CARDIOVERSION;  Surgeon: Bensimhon, Daniel R, MD;  Location: [facility] ENDOSCOPY;  Service: Cardiovascular;  Laterality: N/A;  . CHOLECYSTECTOMY     2012  . CORONARY ARTERY BYPASS GRAFT     2003  . PARATHYROIDECTOMY     20 15  . RIGHT/LEFT HEART CATH AND CORONARY/GRAFT ANGIOGRAPHY N/A 04/08/2017   Procedure: RIGHT/LEFT HEART CATH AND CORONARY/GRAFT ANGIOGRAPHY;  Surgeon: Corky Crafts,  MD;  Location: [facility] INVASIVE CV LAB;  Service: Cardiovascular;  Laterality: N/A;  . TEE WITHOUT CARDIOVERSION N/A 04/12/2017   Procedure: TRANSESOPHAGEAL ECHOCARDIOGRAM (TEE);  Surgeon: Dolores Patty, MD;  Location: [facility] ENDOSCOPY;  Service: Cardiovascular;  Laterality: N/A;  . THYROIDECTOMY    . TONSILLECTOMY     1967  . ULTRASOUND GUIDANCE FOR VASCULAR ACCESS  04/08/2017   Procedure: Ultrasound Guidance For Vascular Access;  Surgeon: Corky Crafts, MD;  Location: [facility] INVASIVE CV LAB;  Service:  Cardiovascular;;     Medications Prior to Admission: Prior to Admission medications   Medication Sig Start Date End Date Taking? Authorizing Provider  acetaminophen (TYLENOL) 500 MG tablet Take 1,000 mg by mouth daily as needed for moderate pain.    Yes [provider]  albuterol (PROVENTIL HFA;VENTOLIN HFA) 108 (90 Base) MCG/ACT inhaler Inhale 2 puffs into the lungs every 6 (six) hours as needed for wheezing or shortness of breath.    Yes [provider]  amiodarone (PACERONE) 200 MG tablet Take 1 tablet (200 mg total) daily by mouth. 05/11/17  Yes Jonelle Sidle, MD  apixaban (ELIQUIS) 5 MG TABS tablet Take 1 tablet (5 mg total) by mouth 2 (two) times daily. 10/08/16 06/25/17 Yes Jonelle Sidle, MD  carvedilol (COREG) 3.125 MG tablet Take 3.125 mg by mouth 2 (two) times daily.   Yes [provider]  diphenhydramine-acetaminophen (TYLENOL PM) 25-500 MG TABS tablet Take 2 tablets by mouth at bedtime as needed (sleep).   Yes [provider]  enoxaparin (LOVENOX) 80 MG/0.8ML injection Inject 0.8 mLs (80 mg total) into the skin every 12 (twelve) hours. 05/24/17  Yes Runell Gess, MD  ibuprofen (ADVIL,MOTRIN) 200 MG tablet Take 400 mg by mouth 3 (three) times daily.   Yes [provider]  levothyroxine (SYNTHROID, LEVOTHROID) 175 MCG tablet Take 175 mcg by mouth daily before breakfast.   Yes [provider]  losartan (COZAAR) 25 MG tablet Take 0.5 tablets (12.5 mg total) by mouth daily. 04/13/17  Yes Clegg, Amy D, NP  Magnesium 250 MG TABS Take 250 mg by mouth daily.    Yes [provider]  neomycin-bacitracin-polymyxin (NEOSPORIN) ointment Apply 1 application as needed topically for wound care. apply to toe   Yes [provider]  nitroGLYCERIN (NITRODUR - DOSED IN MG/24 HR) 0.2 mg/hr patch PLACE 1 PATCH ONTO THE SKIN DAILY AS NEEDED FOR CHEST PAINS 12/23/16  Yes Jonelle Sidle, MD  omeprazole (PRILOSEC) 40 MG  capsule Take 40 mg by mouth daily.  01/22/16 05/25/17 Yes [provider]  oxymetazoline (AFRIN) 0.05 % nasal spray Place 1 spray into both nostrils daily.   Yes [provider]  potassium chloride (K-DUR,KLOR-CON) 10 MEQ tablet Take 2 tablets (20 mEq total) by mouth 2 (two) times daily. 04/13/17  Yes Clegg, Amy D, NP  spironolactone (ALDACTONE) 25 MG tablet Take 0.5 tablets (12.5 mg total) by mouth daily. 04/14/17  Yes Clegg, Amy D, NP  sulfamethoxazole-trimethoprim (BACTRIM DS,SEPTRA DS) 800-160 MG tablet Take 1 tablet by mouth 2 (two) times daily. 05/18/17  Yes [provider]  torsemide (DEMADEX) 20 MG tablet Take 2 tablets (40 mg total) by mouth 2 (two) times daily. 04/13/17  Yes Clegg, Amy D, NP     Allergies:    Allergies  Allergen Reactions  . Oxycodone Other (See Comments)    hallucinations   . Pollen Extract Other (See Comments)    Sneezing, running nose.  Krista Blue [Ranolazine] Nausea  Only    Headache and nausea  . Statins Other (See Comments)    Cramping, turns skin yellow    Social History:   Social History   Socioeconomic History  . Marital status: Married    Spouse name: Not on file  . Number of children: Not on file  . Years of education: Not on file  . Highest education level: Not on file  Social Needs  . Financial resource strain: Not on file  . Food insecurity - worry: Not on file  . Food insecurity - inability: Not on file  . Transportation needs - medical: Not on file  . Transportation needs - non-medical: Not on file  Occupational History  . Not on file  Tobacco Use  . Smoking status: Former Smoker    Types: Cigarettes  . Smokeless tobacco: Never Used  Substance and Sexual Activity  . Alcohol use: No  . Drug use: No  . Sexual activity: Not on file  Other Topics Concern  . Not on file  Social History Narrative  . Not on file    Family History:   The patient's family history includes Breast cancer in her maternal aunt  and paternal aunt; Heart attack in her father; Stroke in her mother.    ROS:  Please see the history of present illness.  All other ROS reviewed and negative.     Physical Exam/Data:   Vitals:   05/26/17 1607 05/26/17 1627  BP:  107/71  Temp:  98 F (36.7 C)  TempSrc:  Oral  SpO2:  97%  Weight: 172 lb 3.2 oz (78.1 kg)   Height: 5\' 3"  (1.6 m)    No intake or output data in the 24 hours ending 05/26/17 1721 Filed Weights   05/26/17 1607  Weight: 172 lb 3.2 oz (78.1 kg)   Body mass index is 30.5 kg/m.  General:  Well nourished, well developed, in no acute distress HEENT: normal Lymph: no adenopathy Neck: no JVD Endocrine:  No thryomegaly Vascular: No carotid bruits; Unable to feel RLE pause Cardiac: RRR; no murmur  Lungs:  clear to auscultation bilaterally, no wheezing, rhonchi or rales  Abd: soft, nontender, no hepatomegaly  Ext: no edema Musculoskeletal:  No deformities, BUE and BLE strength normal and equal Skin: warm and dry  Neuro:  CNs 2-12 intact, no focal abnormalities noted Psych:  Normal affect    EKG:  The ECG that was done  was personally reviewed and demonstrates difficult to see P waves, however appears to be in sinus rhythm given heart regular rate and rhythm is.  Relevant CV Studies:  Echo 12/25/2016 LV EF: 25% -   30%  Study Conclusions  - Left ventricle: The cavity size was mildly dilated. Wall   thickness was normal. Systolic function was severely reduced. The   estimated ejection fraction was in the range of 25% to 30%. There   is akinesis of the mid-apicalanteroseptal and apical myocardium.   There is severe hypokinesis of the apicalinferior myocardium. The   study is not technically sufficient to allow evaluation of LV   diastolic function. - Aortic valve: Trileaflet; mildly calcified leaflets. - Mitral valve: Mildly thickened leaflets . There was mild   regurgitation. - Right ventricle: Systolic function was moderately reduced. -  Tricuspid valve: There was mild regurgitation. - Pulmonary arteries: PA peak pressure: 40 mm Hg (S). - Pericardium, extracardiac: There was no pericardial effusion.  Impressions:  - Normal LV wall thickness with mild chamber dilatation and  LVEF   approximately 25-30%. There is mid to apical anteroseptal and   apical akinesis with severe hypokinesis of the apical inferior   wall. Indeterminate diastolic function. Mildly thickened mitral   leaflets with mild mitral regurgitation. Mildly sclerotic aortic   valve. Moderately reduced right ventricular contraction. Mild   tricuspid regurgitation with PASP estimated 40 mmHg.   Cath 04/08/2017 Conclusion     Ost LM to LM lesion, 25 %stenosed.  Prox Cx lesion, 25 %stenosed.  2nd Mrg lesion, 99 %stenosed. SVG to OM is occluded.  Ost 2nd Mrg to 2nd Mrg lesion, 75 %stenosed.  Mid Cx lesion, 25 %stenosed.  Prox RCA lesion, 80 %stenosed.  Dist RCA lesion, 100 %stenosed. SVG to PLA is patent.  Mid LAD lesion, 100 %stenosed. LIMA to Lad is occluded  LV end diastolic pressure is moderately elevated.  There is no aortic valve stenosis.  Hemodynamic findings consistent with severe pulmonary hypertension.  Ao sat: 95%, PA sat 61%, [name] 4.7 L/min; CI 2.57   Severe three vessel CAD.  1/3 grafts patent.  Severe pulmonary hypertension.   Plan diuresis and management from the heart failure team.  EF known to be severely reduced.  Could consider PCI of the RCA that feeds the PDA.  I think her DOE is related to her pulmonary HTN, volume overload rather than her RCA stenosis.      BLE ABI 05/24/2017 Final Interpretation: Right: Resting right ankle-brachial index indicates moderate right lower extremity arterial disease. Unable to obtain TBI. Left: Resting left ankle-brachial index indicates mild right lower extremity arterial disease. Unable to obtain TBI.   Laboratory Data:  Chemistry Recent Labs  Lab 05/24/17 1239  NA 139  K  4.9  CL 101  CO2 18*  GLUCOSE 93  BUN 55*  CREATININE 2.45*  CALCIUM 9.8  GFRNONAA 19*  GFRAA 22*    No results for input(s): PROT, ALBUMIN, AST, ALT, ALKPHOS, BILITOT in the last 168 hours. Hematology Recent Labs  Lab 05/24/17 1239  WBC 9.7  RBC 4.46  HGB 13.0  HCT 39.4  MCV 88  MCH 29.1  MCHC 33.0  RDW 18.2*  PLT 339   Cardiac EnzymesNo results for input(s): TROPONINI in the last 168 hours. No results for input(s): TROPIPOC in the last 168 hours.  BNPNo results for input(s): BNP, PROBNP in the last 168 hours.  DDimer No results for input(s): DDIMER in the last 168 hours.  Radiology/Studies:  No results found.  Assessment and Plan:   1. RLE critical limb ischemia: plan for soft IV hydration and LE angio next Monday when renal function improve  2. PAF: difficult to see p wave on today's EKG, but appears to be in sinus rhythm given how regular it is. On Eliquis, plan to hold eliquis and switch to IV heparin  3. CAD s/p CABG: denies any chest pain, last cath 03/2017  4. ICM with baseline EF 25-30%, need to be careful with hydration as she is prone to volume overload. Hold home diuretic  5. HTN  6. HLD  7. DM II  Severity of Illness: The appropriate patient status for this patient is INPATIENT. Inpatient status is judged to be reasonable and necessary in order to provide the required intensity of service to ensure the patient's safety. The patient's presenting symptoms, physical exam findings, and initial radiographic and laboratory data in the context of their chronic comorbidities is felt to place them at high risk for further clinical deterioration. Furthermore, it is not anticipated  that the patient will be medically stable for discharge from the hospital within 2 midnights of admission. The following factors support the patient status of inpatient.   " The patient's presenting symptoms include R foot pain. " The worrisome physical exam findings include decrease  pulse in R leg. " The initial radiographic and laboratory data are worrisome because of ABI showed monophasic waveform in RLE. " The chronic [name]-morbidities include PAD, CAD s/p CABG, ICM.  * I certify that at the point of admission it is my clinical judgment that the patient will require inpatient hospital care spanning beyond 2 midnights from the point of admission due to high intensity of service, high risk for further deterioration and high frequency of surveillance required.*   For questions or updates, please contact [facility] Please consult [email or web address] for contact info under Cardiology/STEMI.   Ramond DialSigned, Hao Meng, GeorgiaPA  05/26/2017 5:21 PM   The patient was seen, examined and discussed with Azalee CourseHao Meng, PA-C and I agree with the above.   71 y.o. female with a history of CAD s/p CABG 2003, her only patent graft is SVG to PLA, this was seen on recent cardiac catheterization on 04/08/2017.  ICM with baseline EF 25-30%, HTN, HLD, DM II, PAF on eliquis, and PAD admitted for AKI and IV hydration prior to LE angiography next Monday for critical R lower limb ischemia.  She was admitted on 04/13/17 with acute on chronic combined systolic and diastolic CHF, diuresed 13 lbs, crea at the time 1.25, today 2.5. She was cardioverted during that visit, currently in SR.   The patient reports right foot pain on exertion and at rest. She has ho have her leg hanging down from the bed to get some relief. On physical exam, she has no JVDs, regular heart rate, no murmur, lungs are clear to auscultation, LE have no edema, poor pulses and cyanotic appearance of her toes. She states that pain is tolerable. We will start NaCl 50 cc/hr, monitor her closely given her low EF and h/o CHF. Hold Eliquis, lasix, start Heparin drip.  Tobias AlexanderKatarina Christopherjohn Schiele, MD 05/26/2017

## 2017-05-26 NOTE — Telephone Encounter (Signed)
Result Notes for Basic metabolic panel   Notes recorded by Evans LanceStover, Orabelle Rylee W on 05/26/2017 at 8:13 AM EST Bed control unable to provide bed for patient yesterday. They stated they will have a bed available for her today and will call her this afternoon to come to hospital for pre-hydration admission and IV Heparin. Pt will be inpatient through procedure on Monday, 12/3. Pt has been made aware and is agreeable to this. ------  Notes recorded by Runell GessBerry, Jonathan J, MD on 05/25/2017 at 9:04 AM EST Progressive increase in serum creatinine in the last month. This precludes angiography tomorrow. I recommend bringing her in an transitioning her from oral anticoagulation to IV heparin. I would also discontinue her diuretics and gently hydrate her anticipation for angiogram on Monday.

## 2017-05-27 ENCOUNTER — Other Ambulatory Visit: Payer: Self-pay | Admitting: Cardiovascular Disease

## 2017-05-27 DIAGNOSIS — I70229 Atherosclerosis of native arteries of extremities with rest pain, unspecified extremity: Secondary | ICD-10-CM

## 2017-05-27 DIAGNOSIS — I739 Peripheral vascular disease, unspecified: Secondary | ICD-10-CM

## 2017-05-27 DIAGNOSIS — E1151 Type 2 diabetes mellitus with diabetic peripheral angiopathy without gangrene: Principal | ICD-10-CM

## 2017-05-27 DIAGNOSIS — I480 Paroxysmal atrial fibrillation: Secondary | ICD-10-CM

## 2017-05-27 DIAGNOSIS — I998 Other disorder of circulatory system: Secondary | ICD-10-CM

## 2017-05-27 DIAGNOSIS — Z794 Long term (current) use of insulin: Secondary | ICD-10-CM

## 2017-05-27 DIAGNOSIS — I25810 Atherosclerosis of coronary artery bypass graft(s) without angina pectoris: Secondary | ICD-10-CM

## 2017-05-27 LAB — HEPARIN LEVEL (UNFRACTIONATED): Heparin Unfractionated: >2.2 IU/mL — ABNORMAL HIGH (ref 0.30–0.70)

## 2017-05-27 LAB — BASIC METABOLIC PANEL
Anion gap: 11 (ref 5–15)
BUN: 50 mg/dL — ABNORMAL HIGH (ref 6–20)
CHLORIDE: 102 mmol/L (ref 101–111)
CO2: 17 mmol/L — AB (ref 22–32)
Calcium: 9.3 mg/dL (ref 8.9–10.3)
Creatinine, Ser: 2.08 mg/dL — ABNORMAL HIGH (ref 0.44–1.00)
GFR calc non Af Amer: 23 mL/min — ABNORMAL LOW (ref >60)
GFR, EST AFRICAN AMERICAN: 27 mL/min — AB (ref >60)
Glucose, Bld: 125 mg/dL — ABNORMAL HIGH (ref 65–99)
POTASSIUM: 4.3 mmol/L (ref 3.5–5.1)
Sodium: 130 mmol/L — ABNORMAL LOW (ref 135–145)

## 2017-05-27 LAB — CBC
HEMATOCRIT: 38.9 % (ref 36.0–46.0)
HEMOGLOBIN: 12.6 g/dL (ref 12.0–15.0)
MCH: 28.6 pg (ref 26.0–34.0)
MCHC: 32.4 g/dL (ref 30.0–36.0)
MCV: 88.4 fL (ref 78.0–100.0)
Platelets: 311 10*3/uL (ref 150–400)
RBC: 4.4 MIL/uL (ref 3.87–5.11)
RDW: 17.6 % — ABNORMAL HIGH (ref 11.5–15.5)
WBC: 9.2 10*3/uL (ref 4.0–10.5)

## 2017-05-27 LAB — APTT
aPTT: 82 seconds — ABNORMAL HIGH (ref 24–36)
aPTT: 93 seconds — ABNORMAL HIGH (ref 24–36)

## 2017-05-27 MED ORDER — MORPHINE SULFATE (PF) 2 MG/ML IV SOLN
1.0000 mg | INTRAVENOUS | Status: DC | PRN
  Administered 2017-05-27: 2 mg via INTRAVENOUS
  Administered 2017-05-27: 1 mg via INTRAVENOUS
  Administered 2017-05-28 – 2017-05-29 (×6): 2 mg via INTRAVENOUS
  Filled 2017-05-27 (×8): qty 1

## 2017-05-27 MED ORDER — MORPHINE SULFATE (PF) 2 MG/ML IV SOLN: 1.0000 mg | INTRAVENOUS | Status: DC | PRN

## 2017-05-27 NOTE — Consult Note (Signed)
Advanced Heart Failure Team Consult Note   Primary Physician: Primary HF Cardiologist:  Dr Gala Romney    Reason for Consultation: Heart Failure   HPI:    [name] is seen today for evaluation of heart failure at the request of Dr Delton See.    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   Earlier this month she saw Dr Diona Browner and had RLE pain and discoloration. Had ABI as noted below. Has also had elevated creatinine in  11/27 up from 1.4>2.45.   Admitted for right leg pain/limb ischemia and planned angio next week. Having ongoing RLE pain with at rest and with exertion. No complaints. Diuretics, losartan and eliquis held. Creatinine 2.1 on admit. Started on heparin.   BLE ABI 05/24/2017 Final Interpretation: Right: Resting right ankle-brachial index indicates moderate right lower extremity arterial disease. Unable to obtain TBI. Left: Resting left ankle-brachial index indicates mild right lower extremity arterial disease. Unable to obtain TBI  Echo 03/2017 EF 20-25%.   RHC LHC 04/08/2017  Could consider PCI of the RCA that feeds the PDA CO/CI  4.75  Ost LM to LM lesion, 25 %stenosed.  Prox Cx lesion, 25 %stenosed.  2nd Mrg lesion, 99 %stenosed. SVG to OM is occluded.  Ost 2nd Mrg to 2nd Mrg lesion, 75 %stenosed.  Mid Cx lesion, 25 %stenosed.  Prox RCA lesion, 80 %stenosed.  Dist RCA lesion, 100 %stenosed. SVG to PLA is patent.  Mid LAD lesion, 100 %stenosed. LIMA to Lad is occluded  LV end diastolic pressure is moderately elevated.  There is no aortic valve stenosis.  Hemodynamic findings consistent with severe pulmonary hypertension.  Ao sat: 95%, PA sat 61%, CO 4.7 L/min; CI 2.57   Severe three vessel CAD.  1/3 grafts patent.  Severe pulmonary  hypertension Review of Systems: [y] = yes, [ ]  = no   General: Weight gain [ ] ; Weight loss [ ] ; Anorexia [ ] ; Fatigue [ ] ; Fever [ ] ; Chills [ ] ; Weakness [ ]   Cardiac: Chest pain/pressure [ ] ; Resting SOB [ ] ; Exertional SOB [ ] ; Orthopnea [ ] ; Pedal Edema [ ] ; Palpitations [ ] ; Syncope [ ] ; Presyncope [ ] ; Paroxysmal nocturnal dyspnea[ ]   Pulmonary: Cough [ ] ; Wheezing[ ] ; Hemoptysis[ ] ; Sputum [ ] ; Snoring [ ]   GI: Vomiting[ ] ; Dysphagia[ ] ; Melena[ ] ; Hematochezia [ ] ; Heartburn[ ] ; Abdominal pain [ ] ; Constipation [ ] ; Diarrhea [ ] ; BRBPR [ ]   GU: Hematuria[ ] ; Dysuria [ ] ; Nocturia[ ]   Vascular: Pain in legs with walking [Y ]; Pain in feet with lying flat [ Y]; Non-healing sores [Y ]; Stroke [ ] ; TIA [ ] ; Slurred speech [ ] ;  Neuro: Headaches[ ] ; Vertigo[ ] ; Seizures[ ] ; Paresthesias[ ] ;Blurred vision [ ] ; Diplopia [ ] ; Vision changes [ ]   Ortho/Skin: Arthritis [ ] ; Joint pain [Y ]; Muscle pain [ ] ; Joint swelling [ ] ; Back Pain [Y ]; Rash [ ]   Psych: Depression[ ] ; Anxiety[ ]   Heme: Bleeding problems [ ] ; Clotting disorders [ ] ; Anemia [ ]   Endocrine: Diabetes [ ] ; Thyroid dysfunction[ ]   Home Medications Prior to Admission medications   Medication Sig Start Date End Date Taking? Authorizing Provider  acetaminophen (TYLENOL) 500 MG tablet Take 1,000 mg by mouth daily as needed for moderate pain.    Yes  [provider]  albuterol (PROVENTIL HFA;VENTOLIN HFA) 108 (90 Base) MCG/ACT inhaler Inhale 2 puffs into the lungs every 6 (six) hours as needed for wheezing or shortness of breath.    Yes [provider]  amiodarone (PACERONE) 200 MG tablet Take 1 tablet (200 mg total) daily by mouth. 05/11/17  Yes Jonelle Sidle, MD  apixaban (ELIQUIS) 5 MG TABS tablet Take 1 tablet (5 mg total) by mouth 2 (two) times daily. 10/08/16 06/25/17 Yes Jonelle Sidle, MD  carvedilol (COREG) 3.125 MG tablet Take 3.125 mg by mouth 2 (two) times daily.   Yes [provider]    diphenhydramine-acetaminophen (TYLENOL PM) 25-500 MG TABS tablet Take 2 tablets by mouth at bedtime as needed (sleep).   Yes [provider]  enoxaparin (LOVENOX) 80 MG/0.8ML injection Inject 0.8 mLs (80 mg total) into the skin every 12 (twelve) hours. 05/24/17  Yes Runell Gess, MD  ibuprofen (ADVIL,MOTRIN) 200 MG tablet Take 400 mg by mouth 3 (three) times daily.   Yes [provider]  levothyroxine (SYNTHROID, LEVOTHROID) 175 MCG tablet Take 175 mcg by mouth daily before breakfast.   Yes [provider]  losartan (COZAAR) 25 MG tablet Take 0.5 tablets (12.5 mg total) by mouth daily. 04/13/17  Yes Clegg, Amy D, NP  Magnesium 250 MG TABS Take 250 mg by mouth daily.    Yes [provider]  neomycin-bacitracin-polymyxin (NEOSPORIN) ointment Apply 1 application as needed topically for wound care. apply to toe   Yes [provider]  nitroGLYCERIN (NITRODUR - DOSED IN MG/24 HR) 0.2 mg/hr patch PLACE 1 PATCH ONTO THE SKIN DAILY AS NEEDED FOR CHEST PAINS 12/23/16  Yes Jonelle Sidle, MD  omeprazole (PRILOSEC) 40 MG capsule Take 40 mg by mouth daily.  01/22/16 05/25/17 Yes [provider]  oxymetazoline (AFRIN) 0.05 % nasal spray Place 1 spray into both nostrils daily.   Yes [provider]  potassium chloride (K-DUR,KLOR-CON) 10 MEQ tablet Take 2 tablets (20 mEq total) by mouth 2 (two) times daily. 04/13/17  Yes Clegg, Amy D, NP  spironolactone (ALDACTONE) 25 MG tablet Take 0.5 tablets (12.5 mg total) by mouth daily. 04/14/17  Yes Clegg, Amy D, NP  sulfamethoxazole-trimethoprim (BACTRIM DS,SEPTRA DS) 800-160 MG tablet Take 1 tablet by mouth 2 (two) times daily. 05/18/17  Yes [provider]  torsemide (DEMADEX) 20 MG tablet Take 2 tablets (40 mg total) by mouth 2 (two) times daily. 04/13/17  Yes Clegg, Amy D, NP    Past Medical History: Past Medical History:  Diagnosis Date  . Asthma   . CAD (coronary artery disease)     Multivessel status post CABG in 2003  . Chronic combined systolic (congestive) and diastolic (congestive) heart failure (HCC) 12/25/2016  . Chronic systolic heart failure (HCC)   . Essential hypertension   . GERD (gastroesophageal reflux disease)   . Gum disease   . History of atrial flutter    Cardioversion 2016 in [place]  . History of colonic polyps   . History of goiter   . Hyperlipidemia   . Hypothyroidism   . Ischemic cardiomyopathy 12/25/2016  . Myocardial infarction (HCC)    2003  . Peripheral arterial disease (HCC)    Stent revascularization 2015 - details not clear  . Renal insufficiency   . Type 2 diabetes mellitus (HCC)     Past Surgical History: Past Surgical History:  Procedure Laterality Date  . CARDIOVERSION N/A 04/12/2017   Procedure: CARDIOVERSION;  Surgeon: Makeila Yamaguchi,  Bevelyn Buckles, MD;  Location: [facility] ENDOSCOPY;  Service: Cardiovascular;  Laterality: N/A;  . CARDIOVERSION N/A 05/02/2017   Procedure: CARDIOVERSION;  Surgeon: Dolores Patty, MD;  Location: [facility] ENDOSCOPY;  Service: Cardiovascular;  Laterality: N/A;  . CHOLECYSTECTOMY     2012  . CORONARY ARTERY BYPASS GRAFT     2003  . PARATHYROIDECTOMY     2015  . RIGHT/LEFT HEART CATH AND CORONARY/GRAFT ANGIOGRAPHY N/A 04/08/2017   Procedure: RIGHT/LEFT HEART CATH AND CORONARY/GRAFT ANGIOGRAPHY;  Surgeon: Corky Crafts, MD;  Location: [facility] INVASIVE CV LAB;  Service: Cardiovascular;  Laterality: N/A;  . TEE WITHOUT CARDIOVERSION N/A 04/12/2017   Procedure: TRANSESOPHAGEAL ECHOCARDIOGRAM (TEE);  Surgeon: Dolores Patty, MD;  Location: [facility] ENDOSCOPY;  Service: Cardiovascular;  Laterality: N/A;  . THYROIDECTOMY    . TONSILLECTOMY     1967  . ULTRASOUND GUIDANCE FOR VASCULAR ACCESS  04/08/2017   Procedure: Ultrasound Guidance For Vascular Access;  Surgeon: Corky Crafts, MD;  Location: [facility] INVASIVE CV LAB;  Service: Cardiovascular;;    Family History: Family History  Problem Relation Age of Onset    . Stroke Mother   . Heart attack Father   . Breast cancer Maternal Aunt   . Breast cancer Paternal Aunt     Social History: Social History   Socioeconomic History  . Marital status: Married    Spouse name: Not on file  . Number of children: Not on file  . Years of education: Not on file  . Highest education level: Not on file  Social Needs  . Financial resource strain: Not on file  . Food insecurity - worry: Not on file  . Food insecurity - inability: Not on file  . Transportation needs - medical: Not on file  . Transportation needs - non-medical: Not on file  Occupational History  . Not on file  Tobacco Use  . Smoking status: Former Smoker    Types: Cigarettes  . Smokeless tobacco: Never Used  Substance and Sexual Activity  . Alcohol use: No  . Drug use: No  . Sexual activity: Not on file  Other Topics Concern  . Not on file  Social History Narrative  . Not on file    Allergies:  Allergies  Allergen Reactions  . Oxycodone Other (See Comments)    hallucinations   . Pollen Extract Other (See Comments)    Sneezing, running nose.  . Ranexa [Ranolazine] Nausea Only    Headache and nausea  . Statins Other (See Comments)    Cramping, turns skin yellow    Objective:    Vital Signs:   Temp:  [97.8 F (36.6 C)-98 F (36.7 C)] 97.9 F (36.6 C) (11/30 0500) Pulse Rate:  [88-93] 91 (11/30 0924) Resp:  [13-14] 14 (11/30 0500) BP: (100-115)/(60-73) 100/65 (11/30 0924) SpO2:  [95 %-99 %] 99 % (11/30 0500) Weight:  [172 lb 3.2 oz (78.1 kg)-172 lb 14.4 oz (78.4 kg)] 172 lb 14.4 oz (78.4 kg) (11/30 0500) Last BM Date: 05/26/17  Weight change: Filed Weights   05/26/17 1607 05/27/17 0500  Weight: 172 lb 3.2 oz (78.1 kg) 172 lb 14.4 oz (78.4 kg)    Intake/Output:   Intake/Output Summary (Last 24 hours) at 05/27/2017 1059 Last data filed at 05/27/2017 0925 Gross per 24 hour  Intake 1208.52 ml  Output -  Net 1208.52 ml      Physical Exam    General:  Well  appearing. No resp difficulty. Sitting in the chair.  HEENT: normal  Neck: supple. JVP 5-6 . Carotids 2+ bilat; no bruits. No lymphadenopathy or thyromegaly appreciated. Cor: PMI nondisplaced. Regular rate & rhythm. No rubs, gallops or murmurs. Lungs: clear Abdomen: soft, nontender, nondistended. No hepatosplenomegaly. No bruits or masses. Good bowel sounds. Extremities: no cyanosis, clubbing, rash, edema. R toes red tender to tough. L toes red,  Neuro: alert & orientedx3, cranial nerves grossly intact. moves all 4 extremities w/o difficulty. Affect pleasant   Telemetry   NSr 80s  EKG   NSR 88 bpm    Labs   Basic Metabolic Panel: Recent Labs  Lab 05/24/17 1239 05/27/17 0855  NA 139 130*  K 4.9 4.3  CL 101 102  CO2 18* 17*  GLUCOSE 93 125*  BUN 55* 50*  CREATININE 2.45* 2.08*  CALCIUM 9.8 9.3    Liver Function Tests: No results for input(s): AST, ALT, ALKPHOS, BILITOT, PROT, ALBUMIN in the last 168 hours. No results for input(s): LIPASE, AMYLASE in the last 168 hours. No results for input(s): AMMONIA in the last 168 hours.  CBC: Recent Labs  Lab 05/24/17 1239 05/27/17 0855  WBC 9.7 9.2  NEUTROABS 7.2*  --   HGB 13.0 12.6  HCT 39.4 38.9  MCV 88 88.4  PLT 339 311    Cardiac Enzymes: No results for input(s): CKTOTAL, CKMB, CKMBINDEX, TROPONINI in the last 168 hours.  BNP: BNP (last 3 results) Recent Labs    12/24/16 0824  BNP 385.0*    ProBNP (last 3 results) No results for input(s): PROBNP in the last 8760 hours.   CBG: No results for input(s): GLUCAP in the last 168 hours.  Coagulation Studies: Recent Labs    05/24/17 1239  LABPROT 12.6*  INR 1.2     Imaging    No results found.   Medications:     Current Medications: . amiodarone  200 mg Oral Daily  . carvedilol  3.125 mg Oral BID  . levothyroxine  175 mcg Oral QAC breakfast  . Magnesium  200 mg Oral Daily  . oxymetazoline  1 spray Each Nare Daily     Infusions: . sodium  chloride 50 mL/hr at 05/26/17 1849  . heparin 1,050 Units/hr (05/26/17 1938)       Patient Profile   [name] is 71 year old with a h/o CAD, S/P CABG 2003, chronic systolic/diastolic heart failure, HTN, PAF with DC/CV 04/12/2017 and 05/02/2017 , hyperlipidemia, hypothyroidism admitted for optimization prio for lower extremity angio.  Assessment/Plan  1. RLE limb ischemia-  Continue gentle hydration for LE angio Monday 2. Chronic Systolic Heart Failure ICM. ECHO EF 25-30%. Hold lasix, losartan, and spiro for now.  Creatinine has been elevated likely due to overdiuresis. REDS Vest 29%.  Continue low dose carvedilol.  Follow daily BMET 3. PAF DC-CV 03/2017 and 04/2017 Appears to be in NSR. Continue amio 200 mg daily.  On heparin.  4. AKI on CKD Creatinine baseline 1.2-1.4 On admit 2. Hopefully will come down with hydration and holding diuretics.  5.CAD  CABG in 2003. S/P LHC 10/18 1/3 grafts patent. No targets for intervention No S/S ischemia. Restart statin. On bb.   From HF perspective she is stable.    Medication concerns reviewed with patient and pharmacy team. Barriers identified: none   Length of Stay: 1  Amy Clegg, NP  05/27/2017, 10:59 AM  Advanced Heart Failure Team Pager [phone] (M-F; 7a - 4p)  Please contact [facility] for night-coverage after hours (4p -7a ) and weekends on [email or web address]  71 y/o with ischemic CM s/p CABG with EF 20-25%. Recently admitted with HF flare in setting of rapid AF. She improved markedly with diuresis and DC-CV. Unfortunately she reverted to AF and underwent repeat DC-CV. Seen by Dr. Allyson SabalBerry earlier in the week for ischemic right foot. Found to have recurrent AF and AKI. Admitted for IV hydration. I was initially concerned about low output HF with recurrent AF but she states she feels fine and denies HF symptoms. Tolerating AF well in general although today she appears to be in NSR. Creatinine beginning to improve with gentle IV  hydration. REDS reading 27%. Pending LE angio on Monday.   Continue supportive Care. HF team will see again on Monday.   Arvilla Meresaniel Marieliz Strang, MD  7:18 PM

## 2017-05-27 NOTE — Progress Notes (Signed)
ANTICOAGULATION CONSULT NOTE - Follow Up Consult  Pharmacy Consult for  Heparin Indication: atrial fibrillation  Allergies  Allergen Reactions  . Oxycodone Other (See Comments)    hallucinations   . Pollen Extract Other (See Comments)    Sneezing, running nose.  . Ranexa [Ranolazine] Nausea Only    Headache and nausea  . Statins Other (See Comments)    Cramping, turns skin yellow    Patient Measurements: Height: 5\' 3"  (160 cm) Weight: 172 lb 14.4 oz (78.4 kg) IBW/kg (Calculated) : 52.4 Heparin Dosing Weight: 70 kg  Vital Signs: Temp: 97.9 F (36.6 C) (11/30 0500) Temp Source: Oral (11/30 0500) BP: 100/65 (11/30 0924) Pulse Rate: 91 (11/30 0924)  Labs: Recent Labs    05/24/17 1239 05/27/17 0027 05/27/17 0855  HGB 13.0  --  12.6  HCT 39.4  --  38.9  PLT 339  --  311  APTT 34* 82* 93*  LABPROT 12.6*  --   --   INR 1.2  --   --   HEPARINUNFRC  --  >2.20*  --   CREATININE 2.45*  --  2.08*    Estimated Creatinine Clearance: 25 mL/min (A) (by C-G formula based on SCr of 2.08 mg/dL (H)).  Assessment:  71 yr old female on IV heparin for atrial fibrillation.  On Eliquis prior to admission, holding for angiography, which has been postponed until 05/30/17 due to renal function.   Last Eliquis dose 11/27 am.  Received Lovenox 80 sq on 11/27 pm and 11/28 am.  Using aPTTs for heparin monitoring while recent Eliquis doses are effecting heparin levels.    APTT remains therapeutic (93 seconds) on heparin drip at 1050 units/hr.  Goal of Therapy:  Heparin level 0.3-0.7 units/ml aPTT 66-102 seconds Monitor platelets by anticoagulation protocol: Yes   Plan:   Continue heparin drip at 1050 units/hr.  Daily aPTT, heparin level and CBC.  Eliquis on hold.  Dennie FettersEgan, Aqueelah Cotrell Donovan, RPh Pager: [phone]  05/27/2017,11:28 AM

## 2017-05-27 NOTE — Progress Notes (Signed)
ANTICOAGULATION CONSULT NOTE - Follow Up Consult  Pharmacy Consult for heparin Indication: atrial fibrillation  Labs: Recent Labs    05/24/17 1239 05/27/17 0027  HGB 13.0  --   HCT 39.4  --   PLT 339  --   APTT 34* 82*  LABPROT 12.6*  --   INR 1.2  --   CREATININE 2.45*  --     Assessment/Plan:  71yo female therapeutic on heparin with initial dosing while Eliquis on hold. Will continue gtt at current rate and confirm stable with additional PTT.   Vernard GamblesVeronda Naw Lasala, PharmD, BCPS  05/27/2017,2:00 AM

## 2017-05-27 NOTE — Plan of Care (Signed)
  Progressing Clinical Measurements: Ability to maintain clinical measurements within normal limits will improve 05/27/2017 0331 - Progressing by Horris Latinouvall, Iyahna Obriant G, RN Note VSS. Elimination: Will not experience complications related to bowel motility 05/27/2017 0331 - Progressing by Horris Latinouvall, Stephanne Greeley G, RN Note Pt has had several soft BMs today, after taking Miralax.

## 2017-05-27 NOTE — Addendum Note (Signed)
Addended by: Bobbe Quilter L on: 05/27/2017 04:16 PM   Modules accepted: Orders  

## 2017-05-27 NOTE — Progress Notes (Addendum)
Progress Note  Patient Name: [name] Date of Encounter: 05/27/2017  Primary Cardiologist: McDowell/Berry  Subjective   Has limb pain at rest, better when dangling feet at side of the bed. R worse than L. No other complaints. On IV heparin. Creatinine improved at 2.08, down from 2.45 on 11/27; 1.4 a month ago; at baseline 0.9. She had femoral access coronary angiography 04/08/2017. Previously had DCCV for AFib November 5.  Inpatient Medications    Scheduled Meds: . amiodarone  200 mg Oral Daily  . carvedilol  3.125 mg Oral BID  . levothyroxine  175 mcg Oral QAC breakfast  . magnesium oxide  200 mg Oral Daily  . oxymetazoline  1 spray Each Nare Daily   Continuous Infusions: . sodium chloride 50 mL/hr at 05/26/17 1849  . heparin 1,050 Units/hr (05/26/17 1938)   PRN Meds: acetaminophen, diphenhydrAMINE **AND** acetaminophen, albuterol, ALPRAZolam, nitroGLYCERIN, ondansetron (ZOFRAN) IV, traMADol   Vital Signs    Vitals:   05/26/17 1627 05/26/17 2126 05/27/17 0500 05/27/17 0924  BP: 107/71 115/73 110/60 100/65  Pulse:  88 93 91  Resp:  13 14   Temp: 98 F (36.7 C) 97.8 F (36.6 C) 97.9 F (36.6 C)   TempSrc: Oral Oral Oral   SpO2: 97% 95% 99%   Weight:   172 lb 14.4 oz (78.4 kg)   Height:        Intake/Output Summary (Last 24 hours) at 05/27/2017 1129 Last data filed at 05/27/2017 0925 Gross per 24 hour  Intake 1208.52 ml  Output -  Net 1208.52 ml   Filed Weights   05/26/17 1607 05/27/17 0500  Weight: 172 lb 3.2 oz (78.1 kg) 172 lb 14.4 oz (78.4 kg)    Telemetry    NSR - Personally Reviewed  ECG    NSR, IVCD - Personally Reviewed  Physical Exam  Comfortable GEN: No acute distress.   Neck: No JVD Cardiac: RRR, no murmurs, rubs, or gallops. Both feet have erythematous toes and delayed capillary refill, no pulses are palpable. Respiratory: Clear to auscultation bilaterally. GI: Soft, nontender, non-distended  MS: No edema; No  deformity. Neuro:  Nonfocal  Psych: Normal affect   Labs    Chemistry Recent Labs  Lab 05/24/17 1239 05/27/17 0855  NA 139 130*  K 4.9 4.3  CL 101 102  CO2 18* 17*  GLUCOSE 93 125*  BUN 55* 50*  CREATININE 2.45* 2.08*  CALCIUM 9.8 9.3  GFRNONAA 19* 23*  GFRAA 22* 27*  ANIONGAP  --  11     Hematology Recent Labs  Lab 05/24/17 1239 05/27/17 0855  WBC 9.7 9.2  RBC 4.46 4.40  HGB 13.0 12.6  HCT 39.4 38.9  MCV 88 88.4  MCH 29.1 28.6  MCHC 33.0 32.4  RDW 18.2* 17.6*  PLT 339 311     Radiology    No results found.  Cardiac Studies   05/13/2017 LEA Doppler IMPRESSION: 1. Abnormal monophasic waveforms in the right popliteal artery and tibial runoff without focal obstructive lesion identified. 2. Monophasic waveforms in the left tibial runoff with high-grade stenosis in the proximal anterior tibial artery.  04/08/2017 CATH Conclusion     Ost LM to LM lesion, 25 %stenosed.  Prox Cx lesion, 25 %stenosed.  2nd Mrg lesion, 99 %stenosed. SVG to OM is occluded.  Ost 2nd Mrg to 2nd Mrg lesion, 75 %stenosed.  Mid Cx lesion, 25 %stenosed.  Prox RCA lesion, 80 %stenosed.  Dist RCA lesion, 100 %stenosed. SVG to PLA  is patent.  Mid LAD lesion, 100 %stenosed. LIMA to Lad is occluded  LV end diastolic pressure is moderately elevated.  There is no aortic valve stenosis.  Hemodynamic findings consistent with severe pulmonary hypertension.  Ao sat: 95%, PA sat 61%, CO 4.7 L/min; CI 2.57   Severe three vessel CAD.  1/3 grafts patent.  Severe pulmonary hypertension.   Plan diuresis and management from the heart failure team.  EF known to be severely reduced.  Could consider PCI of the RCA that feeds the PDA.  I think her DOE is related to her pulmonary HTN, volume overload rather than her RCA stenosis.    Fick Cardiac Output 4.75 L/min  Fick Cardiac Output Index 2.57 (L/min)/BSA  RA A Wave 20 mmHg  RA V Wave 24 mmHg  RA Mean 21 mmHg  RV Systolic  Pressure 63 mmHg  RV Diastolic Pressure 17 mmHg  RV EDP 19 mmHg  PA Systolic Pressure 74 mmHg  PA Diastolic Pressure 36 mmHg  PA Mean 57 mmHg  PW A Wave 29 mmHg  PW V Wave 28 mmHg  PW Mean 28 mmHg  AO Systolic Pressure 129 mmHg  AO Diastolic Pressure 67 mmHg  AO Mean 89 mmHg  LV Systolic Pressure 139 mmHg  LV Diastolic Pressure 21 mmHg  LV EDP 23 mmHg       04/12/2017 TEE  - Left ventricle: Systolic function was severely reduced. The   estimated ejection fraction was in the range of 20% to 25%.   Diffuse hypokinesis. No evidence of thrombus. - Aortic valve: No evidence of vegetation. - Mitral valve: No evidence of vegetation. There was mild   regurgitation. - Left atrium: The atrium was moderately to severely dilated. No   evidence of thrombus in the atrial cavity or appendage. No   evidence of thrombus in the atrial cavity or appendage. - Right ventricle: The cavity size was dilated. Systolic function   was severely reduced. - Right atrium: The atrium was severely dilated. - Atrial septum: No defect or patent foramen ovale was identified. - Tricuspid valve: No evidence of vegetation. There was moderate   regurgitation.  Impressions:  - Successful cardioversion. No cardiac source of emboli was   indentified.    Patient Profile     71 y.o. female with severe ischemic CMP, CAD s/p CABG with 1/3 grafts patent, CAD, combined systolic and diastolic HF, severe PAH, severe PAD with resting limb ischemia, admitted for IV heaprin and for hydration due to acute renal insufficiency, prior to planned lower extremity arteriography on Monday.  Assessment & Plan    1. CHF: clinically euvolemic, but had PAWP 28 at [facility]. On low dose carvedilol. ARB and spironolactone on hold. 2. CAD: severe native and graft disease. Angina free. 3. PAH: severe and only partly explained by left heart failure (transpulmonary gradient almost 30). 4. PAD: severe disease, mostly infrapopliteal.  Concern for superimposed AF-related thrombembolism. Also may have cholesterol embolism after femoral cath in October (this could also explain delayed acute renal insufficiency). High risk for limb loss with DM as well. For angiography on Monday, as allowed by renal function. 5. AFib:  Seems to be in sinus rhythm, definitely regular rhythm, but P waves are very indistinct on ECG. On amiodarone and carvedilol. Eliquis on hold for cath, on IV heparin. 6. ARF:  Baseline 0.9, was up to 1.4 a month ago, 2 weeks after R&LHC, but was 2.5 prior to this admission. Could be due to diuretics, but the steady  upward trend is concerning for cholesterol embolism. Response to IV fluids so far is encouraging. Resume IVF tomorrow, hold diuretics and ARB.  For questions or updates, please contact [facility] Please consult [email or web address] for contact info under Cardiology/STEMI.      Signed, Thurmon FairMihai Oliviah Agostini, MD  05/27/2017, 11:29 AM

## 2017-05-28 LAB — CBC
HCT: 34.3 % — ABNORMAL LOW (ref 36.0–46.0)
Hemoglobin: 11.1 g/dL — ABNORMAL LOW (ref 12.0–15.0)
MCH: 29 pg (ref 26.0–34.0)
MCHC: 32.4 g/dL (ref 30.0–36.0)
MCV: 89.6 fL (ref 78.0–100.0)
Platelets: 236 K/uL (ref 150–400)
RBC: 3.83 MIL/uL — ABNORMAL LOW (ref 3.87–5.11)
RDW: 17.6 % — ABNORMAL HIGH (ref 11.5–15.5)
WBC: 8.8 K/uL (ref 4.0–10.5)

## 2017-05-28 LAB — BASIC METABOLIC PANEL WITH GFR
Anion gap: 10 (ref 5–15)
BUN: 42 mg/dL — ABNORMAL HIGH (ref 6–20)
CO2: 19 mmol/L — ABNORMAL LOW (ref 22–32)
Calcium: 9.1 mg/dL (ref 8.9–10.3)
Chloride: 101 mmol/L (ref 101–111)
Creatinine, Ser: 1.86 mg/dL — ABNORMAL HIGH (ref 0.44–1.00)
GFR calc Af Amer: 31 mL/min — ABNORMAL LOW
GFR calc non Af Amer: 26 mL/min — ABNORMAL LOW
Glucose, Bld: 111 mg/dL — ABNORMAL HIGH (ref 65–99)
Potassium: 4.8 mmol/L (ref 3.5–5.1)
Sodium: 130 mmol/L — ABNORMAL LOW (ref 135–145)

## 2017-05-28 LAB — HEPARIN LEVEL (UNFRACTIONATED): Heparin Unfractionated: >2.2 [IU]/mL — ABNORMAL HIGH (ref 0.30–0.70)

## 2017-05-28 LAB — APTT: APTT: 74 s — AB (ref 24–36)

## 2017-05-28 MED ORDER — PANTOPRAZOLE SODIUM 40 MG PO TBEC
40.0000 mg | DELAYED_RELEASE_TABLET | Freq: Every day | ORAL | Status: DC
  Administered 2017-05-28 – 2017-05-30 (×3): 40 mg via ORAL
  Filled 2017-05-28 (×3): qty 1

## 2017-05-28 MED ORDER — OXYMETAZOLINE HCL 0.05 % NA SOLN
1.0000 | Freq: Every day | NASAL | Status: DC | PRN
  Filled 2017-05-28: qty 15

## 2017-05-28 NOTE — Plan of Care (Signed)
  Progressing Clinical Measurements: Will remain free from infection 05/28/2017 [id number] - Progressing by Horris Latinouvall, Wyland Rastetter G, RN Note No s/s of infection.

## 2017-05-28 NOTE — Progress Notes (Signed)
Progress Note  Patient Name: [name] Date of Encounter: 05/28/2017  Primary Cardiologist: McDowell/Berry  Subjective   Foot pain unchanged. Slightly short of breath when she was lying in bed last night. Creat down to 1.86.  Inpatient Medications    Scheduled Meds: . amiodarone  200 mg Oral Daily  . carvedilol  3.125 mg Oral BID  . levothyroxine  175 mcg Oral QAC breakfast  . magnesium oxide  200 mg Oral Daily  . oxymetazoline  1 spray Each Nare Daily   Continuous Infusions: . sodium chloride 50 mL/hr at 05/28/17 0305  . heparin 1,050 Units/hr (05/27/17 1539)   PRN Meds: acetaminophen, diphenhydrAMINE **AND** acetaminophen, albuterol, ALPRAZolam, morphine injection, nitroGLYCERIN, ondansetron (ZOFRAN) IV, traMADol   Vital Signs    Vitals:   05/27/17 1402 05/27/17 2104 05/28/17 0534 05/28/17 0900  BP: 119/64 129/64 107/63 109/84  Pulse: 91 84 87 92  Resp: 18 19 18    Temp: 98 F (36.7 C) 98.2 F (36.8 C) 97.9 F (36.6 C)   TempSrc: Oral Oral Oral   SpO2: 100% 98% 99%   Weight:   175 lb 11.2 oz (79.7 kg)   Height:        Intake/Output Summary (Last 24 hours) at 05/28/2017 0932 Last data filed at 05/28/2017 0615 Gross per 24 hour  Intake 1886.63 ml  Output 1350 ml  Net 536.63 ml   Filed Weights   05/26/17 1607 05/27/17 0500 05/28/17 0534  Weight: 172 lb 3.2 oz (78.1 kg) 172 lb 14.4 oz (78.4 kg) 175 lb 11.2 oz (79.7 kg)    Telemetry    NSR - Personally Reviewed  ECG    NSR, IVCD - Personally Reviewed  Physical Exam  Comfortable, dangling legs at side of bed GEN: No acute distress.   Neck:  JVP now up to about 5-6 cm in chair Cardiac: RRR, no murmurs, rubs, or gallops. Both feet have erythematous toes and delayed capillary refill, no pulses are palpable. Respiratory: Clear to auscultation bilaterally. GI: Soft, nontender, non-distended  MS:  1+ pedal edema bilaterally, a little worse on the right; multiple small ulcerations on both feet; No  deformity. Neuro:  Nonfocal  Psych: Normal affect   Labs    Chemistry Recent Labs  Lab 05/24/17 1239 05/27/17 0855 05/28/17 0324  NA 139 130* 130*  K 4.9 4.3 4.8  CL 101 102 101  CO2 18* 17* 19*  GLUCOSE 93 125* 111*  BUN 55* 50* 42*  CREATININE 2.45* 2.08* 1.86*  CALCIUM 9.8 9.3 9.1  GFRNONAA 19* 23* 26*  GFRAA 22* 27* 31*  ANIONGAP  --  11 10     Hematology Recent Labs  Lab 05/24/17 1239 05/27/17 0855 05/28/17 0324  WBC 9.7 9.2 8.8  RBC 4.46 4.40 3.83*  HGB 13.0 12.6 11.1*  HCT 39.4 38.9 34.3*  MCV 88 88.4 89.6  MCH 29.1 28.6 29.0  MCHC 33.0 32.4 32.4  RDW 18.2* 17.6* 17.6*  PLT 339 311 236    Cardiac EnzymesNo results for input(s): TROPONINI in the last 168 hours. No results for input(s): TROPIPOC in the last 168 hours.   BNPNo results for input(s): BNP, PROBNP in the last 168 hours.   DDimer No results for input(s): DDIMER in the last 168 hours.   Radiology    No results found.  Cardiac Studies   05/13/2017 LEA Doppler IMPRESSION: 1. Abnormal monophasic waveforms in the right popliteal artery and tibial runoff without focal obstructive lesion identified. 2. Monophasic waveforms  in the left tibial runoff with high-grade stenosis in the proximal anterior tibial artery.  04/08/2017 CATH Conclusion     Ost LM to LM lesion, 25 %stenosed.  Prox Cx lesion, 25 %stenosed.  2nd Mrg lesion, 99 %stenosed. SVG to OM is occluded.  Ost 2nd Mrg to 2nd Mrg lesion, 75 %stenosed.  Mid Cx lesion, 25 %stenosed.  Prox RCA lesion, 80 %stenosed.  Dist RCA lesion, 100 %stenosed. SVG to PLA is patent.  Mid LAD lesion, 100 %stenosed. LIMA to Lad is occluded  LV end diastolic pressure is moderately elevated.  There is no aortic valve stenosis.  Hemodynamic findings consistent with severe pulmonary hypertension.  Ao sat: 95%, PA sat 61%, CO 4.7 L/min; CI 2.57  Severe three vessel CAD. 1/3 grafts patent. Severe pulmonary hypertension.   Plan  diuresis and management from the heart failure team. EF known to be severely reduced.  Could consider PCI of the RCA that feeds the PDA. I think her DOE is related to her pulmonary HTN, volume overload rather than her RCA stenosis.   Fick Cardiac Output 4.75 L/min  Fick Cardiac Output Index 2.57 (L/min)/BSA  RA A Wave 20 mmHg  RA V Wave 24 mmHg  RA Mean 21 mmHg  RV Systolic Pressure 63 mmHg  RV Diastolic Pressure 17 mmHg  RV EDP 19 mmHg  PA Systolic Pressure 74 mmHg  PA Diastolic Pressure 36 mmHg  PA Mean 57 mmHg  PW A Wave 29 mmHg  PW V Wave 28 mmHg  PW Mean 28 mmHg  AO Systolic Pressure 129 mmHg  AO Diastolic Pressure 67 mmHg  AO Mean 89 mmHg  LV Systolic Pressure 139 mmHg  LV Diastolic Pressure 21 mmHg  LV EDP 23 mmHg       04/12/2017 TEE  - Left ventricle: Systolic function was severely reduced. The estimated ejection fraction was in the range of 20% to 25%. Diffuse hypokinesis. No evidence of thrombus. - Aortic valve: No evidence of vegetation. - Mitral valve: No evidence of vegetation. There was mild regurgitation. - Left atrium: The atrium was moderately to severely dilated. No evidence of thrombus in the atrial cavity or appendage. No evidence of thrombus in the atrial cavity or appendage. - Right ventricle: The cavity size was dilated. Systolic function was severely reduced. - Right atrium: The atrium was severely dilated. - Atrial septum: No defect or patent foramen ovale was identified. - Tricuspid valve: No evidence of vegetation. There was moderate regurgitation.  Impressions:  - Successful cardioversion. No cardiac source of emboli was indentified.  Patient Profile     71 y.o. 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  Assessment & Plan    1. CHF: clinically beginning to appear hypervolemic. On low dose carvedilol. ARB and spironolactone on hold due to ARF. Stop IVF. Start IV fluids again tomorrow PM for cath on Monday. 2. CAD: severe native and graft disease. Asymptomatic today. 3. PAH: severe and only partly explained by left heart failure (transpulmonary gradient almost 30). 4. PAD: severe disease, mostly infrapopliteal. Acute ischemia may be due to superimposed AF-related thrombembolism or she may have had cholesterol embolism after femoral cath in October (this could also explain delayed acute renal insufficiency). High risk for limb loss with DM as well. For  angiography on Monday, as allowed by renal function. 5. AFib:  On amio and carvedilol, IV heparin. 6. ARF:  Baseline 0.9, was up to 1.4 a month ago (2 weeks after R&LHC), but was 2.5 prior to this admission, improved to 1.86 after fluids. Could be due to diuretics, but consider cholesterol embolism. Response to IV fluids so far is encouraging.  Starting to show a few signs of volume overload, will stop intravenous fluids and plan to resume IVF tomorrow; hold diuretics and ARB.    For questions or updates, please contact [facility] Please consult [email or web address] for contact info under Cardiology/STEMI.      Signed, Thurmon FairMihai Chryl Holten, MD  05/28/2017, 9:32 AM

## 2017-05-28 NOTE — Progress Notes (Signed)
ANTICOAGULATION CONSULT NOTE - Follow Up Consult  Pharmacy Consult for  Heparin Indication: atrial fibrillation  Allergies  Allergen Reactions  . Oxycodone Other (See Comments)    hallucinations   . Pollen Extract Other (See Comments)    Sneezing, running nose.  . Ranexa [Ranolazine] Nausea Only    Headache and nausea  . Statins Other (See Comments)    Cramping, turns skin yellow    Patient Measurements: Height: 5\' 3"  (160 cm) Weight: 175 lb 11.2 oz (79.7 kg) IBW/kg (Calculated) : 52.4 Heparin Dosing Weight: 70 kg  Vital Signs: Temp: 97.9 F (36.6 C) (12/01 0534) Temp Source: Oral (12/01 0534) BP: 109/84 (12/01 0900) Pulse Rate: 92 (12/01 0900)  Labs: Recent Labs    05/27/17 0027 05/27/17 0855 05/28/17 0324  HGB  --  12.6 11.1*  HCT  --  38.9 34.3*  PLT  --  311 236  APTT 82* 93* 74*  HEPARINUNFRC >2.20*  --  >2.20*  CREATININE  --  2.08* 1.86*    Estimated Creatinine Clearance: 28.1 mL/min (A) (by C-G formula based on SCr of 1.86 mg/dL (H)).  Assessment:  71 yr old female on IV heparin for atrial fibrillation.  On Eliquis prior to admission, holding for angiography, which has been postponed until 05/30/17 due to renal function.   Last Eliquis dose 11/27 am.  Received Lovenox 80 sq on 11/27 pm and 11/28 am.  Using aPTTs for heparin monitoring while recent Eliquis doses are effecting heparin levels.    APTT remains therapeutic (74 seconds) on heparin drip at 1050 units/hr.  Goal of Therapy:  Heparin level 0.3-0.7 units/ml aPTT 66-102 seconds Monitor platelets by anticoagulation protocol: Yes   Plan:   Continue heparin drip at 1050 units/hr.  Daily aPTT, heparin level and CBC.  Eliquis on hold.  Dennie FettersEgan, Tamecka Milham Donovan, RPh Pager: [phone]  05/28/2017,10:33 AM

## 2017-05-29 ENCOUNTER — Encounter (HOSPITAL_COMMUNITY): Payer: Self-pay

## 2017-05-29 DIAGNOSIS — N179 Acute kidney failure, unspecified: Secondary | ICD-10-CM

## 2017-05-29 LAB — APTT
aPTT: 63 seconds — ABNORMAL HIGH (ref 24–36)
aPTT: 75 seconds — ABNORMAL HIGH (ref 24–36)

## 2017-05-29 LAB — BASIC METABOLIC PANEL
ANION GAP: 7 (ref 5–15)
BUN: 27 mg/dL — ABNORMAL HIGH (ref 6–20)
CALCIUM: 9.2 mg/dL (ref 8.9–10.3)
CO2: 20 mmol/L — ABNORMAL LOW (ref 22–32)
CREATININE: 1.24 mg/dL — AB (ref 0.44–1.00)
Chloride: 104 mmol/L (ref 101–111)
GFR calc non Af Amer: 43 mL/min — ABNORMAL LOW (ref >60)
GFR, EST AFRICAN AMERICAN: 50 mL/min — AB (ref >60)
Glucose, Bld: 136 mg/dL — ABNORMAL HIGH (ref 65–99)
Potassium: 4.8 mmol/L (ref 3.5–5.1)
SODIUM: 131 mmol/L — AB (ref 135–145)

## 2017-05-29 LAB — CBC
HCT: 36 % (ref 36.0–46.0)
HEMOGLOBIN: 11.8 g/dL — AB (ref 12.0–15.0)
MCH: 29.1 pg (ref 26.0–34.0)
MCHC: 32.8 g/dL (ref 30.0–36.0)
MCV: 88.9 fL (ref 78.0–100.0)
Platelets: 255 10*3/uL (ref 150–400)
RBC: 4.05 MIL/uL (ref 3.87–5.11)
RDW: 17.8 % — AB (ref 11.5–15.5)
WBC: 10 10*3/uL (ref 4.0–10.5)

## 2017-05-29 LAB — PROTIME-INR
INR: 1.23
Prothrombin Time: 15.4 seconds — ABNORMAL HIGH (ref 11.4–15.2)

## 2017-05-29 LAB — HEPARIN LEVEL (UNFRACTIONATED): HEPARIN UNFRACTIONATED: 1.86 [IU]/mL — AB (ref 0.30–0.70)

## 2017-05-29 MED ORDER — ASPIRIN 81 MG PO CHEW
81.0000 mg | CHEWABLE_TABLET | ORAL | Status: AC
  Administered 2017-05-30: 81 mg via ORAL
  Filled 2017-05-29: qty 1

## 2017-05-29 MED ORDER — SODIUM CHLORIDE 0.9 % IV SOLN: INTRAVENOUS | Status: DC

## 2017-05-29 NOTE — Progress Notes (Signed)
ANTICOAGULATION CONSULT NOTE - Follow Up Consult  Pharmacy Consult for  Heparin Indication: atrial fibrillation  Allergies  Allergen Reactions  . Oxycodone Other (See Comments)    hallucinations   . Pollen Extract Other (See Comments)    Sneezing, running nose.  . Ranexa [Ranolazine] Nausea Only    Headache and nausea  . Statins Other (See Comments)    Cramping, turns skin yellow    Patient Measurements: Height: 5\' 3"  (160 cm) Weight: 178 lb 11.2 oz (81.1 kg) IBW/kg (Calculated) : 52.4 Heparin Dosing Weight: 70 kg  Vital Signs: Temp: 98.5 F (36.9 C) (12/02 1300) Temp Source: Axillary (12/02 1300) BP: 132/87 (12/02 1300) Pulse Rate: 90 (12/02 1300)  Labs: Recent Labs    05/27/17 0027  05/27/17 0855 05/28/17 0324 05/29/17 0549 05/29/17 1509  HGB  --    < > 12.6 11.1* 11.8*  --   HCT  --   --  38.9 34.3* 36.0  --   PLT  --   --  311 236 255  --   APTT 82*  --  93* 74* 63* 75*  HEPARINUNFRC >2.20*  --   --  >2.20* 1.86*  --   CREATININE  --   --  2.08* 1.86* 1.24*  --    < > = values in this interval not displayed.    Estimated Creatinine Clearance: 42.6 mL/min (A) (by C-G formula based on SCr of 1.24 mg/dL (H)).  Assessment:  71 yr old female on IV heparin for atrial fibrillation.  On Eliquis prior to admission, holding for angiography, which has been postponed until 05/30/17 due to renal function.   Last Eliquis dose 11/27 am.  Received Lovenox 80 sq on 11/27 pm and 11/28 am.  Using aPTTs for heparin monitoring while recent Eliquis doses are effecting heparin levels.    APTT is now therapeutic (75 seconds) on heparin drip at 1150 units/hr.  Goal of Therapy:  Heparin level 0.3-0.7 units/ml aPTT 66-102 seconds Monitor platelets by anticoagulation protocol: Yes   Plan:   Continue heparin drip at 1150 units/hr.  Daily aPTT, heparin level and CBC.  Eliquis on hold for angiography on 12/3.  Dennie FettersEgan, Deeric Cruise Donovan, RPh Pager: [phone]  05/29/2017,4:15 PM

## 2017-05-29 NOTE — Progress Notes (Signed)
ANTICOAGULATION CONSULT NOTE - Follow Up Consult  Pharmacy Consult for heparin Indication: atrial fibrillation  Labs: Recent Labs    05/27/17 0027  05/27/17 0855 05/28/17 0324 05/29/17 0549  HGB  --    < > 12.6 11.1* 11.8*  HCT  --   --  38.9 34.3* 36.0  PLT  --   --  311 236 255  APTT 82*  --  93* 74* 63*  HEPARINUNFRC >2.20*  --   --  >2.20*  --   CREATININE  --   --  2.08* 1.86* 1.24*   < > = values in this interval not displayed.    Assessment: 71yo female below goal on heparin after several PTTs at goal though had been trending down.  Goal of Therapy:  aPTT 66-102 seconds   Plan:  Will increase heparin gtt by 1-2 units/kg/hr to 1150 units/hr and check PTT in 8hr.  Vernard GamblesVeronda Adrieanna Boteler, PharmD, BCPS  05/29/2017,6:55 AM

## 2017-05-29 NOTE — H&P (View-Only) (Signed)
Progress Note  Patient Name: AMSI GRIMLEY Date of Encounter: 05/29/2017  Primary Cardiologist: McDowell/Berry  Subjective   She feels that she is becoming a little edematous but does not have any shortness of breath either at rest or in supine position. Continues to have resting foot pain, better with dangling feet at the side of the bed.  Creatinine continues to improve, down to 1.2, approaching baseline of 0.9  Inpatient Medications    Scheduled Meds: . amiodarone  200 mg Oral Daily  . carvedilol  3.125 mg Oral BID  . levothyroxine  175 mcg Oral QAC breakfast  . magnesium oxide  200 mg Oral Daily  . pantoprazole  40 mg Oral Q2200   Continuous Infusions: . sodium chloride 10 mL/hr at 05/28/17 1022  . heparin 1,150 Units/hr (05/29/17 0656)   PRN Meds: acetaminophen, diphenhydrAMINE **AND** acetaminophen, albuterol, ALPRAZolam, morphine injection, nitroGLYCERIN, ondansetron (ZOFRAN) IV, oxymetazoline, traMADol   Vital Signs    Vitals:   05/28/17 1400 05/28/17 2147 05/29/17 0607 05/29/17 0923  BP: 129/83 117/66 110/68 112/74  Pulse:  90 86 89  Resp:  20 16   Temp:  98.4 F (36.9 C) 98.1 F (36.7 C)   TempSrc:  Oral Oral   SpO2:  98% 97%   Weight:   178 lb 11.2 oz (81.1 kg)   Height:        Intake/Output Summary (Last 24 hours) at 05/29/2017 0942 Last data filed at 05/29/2017 0610 Gross per 24 hour  Intake 580.13 ml  Output 1300 ml  Net -719.87 ml   Filed Weights   05/27/17 0500 05/28/17 0534 05/29/17 0607  Weight: 172 lb 14.4 oz (78.4 kg) 175 lb 11.2 oz (79.7 kg) 178 lb 11.2 oz (81.1 kg)    Telemetry    Normal sinus rhythm - Personally Reviewed  ECG    no new tracing - Personally Reviewed  Physical Exam  Comfortable while sitting up in chair GEN: No acute distress.   Neck: 7-8 centimeter JVD Cardiac: RRR, no murmurs, rubs, or gallops.  Respiratory: Clear to auscultation bilaterally. GI: Soft, nontender, non-distended  MS: 2+ symmetrical edema in  the feet bilaterally; No deformity. Both feet have erythematous toes and delayed capillary refill, no pulses are palpable. Neuro:  Nonfocal  Psych: Normal affect   Labs    Chemistry Recent Labs  Lab 05/27/17 0855 05/28/17 0324 05/29/17 0549  NA 130* 130* 131*  K 4.3 4.8 4.8  CL 102 101 104  CO2 17* 19* 20*  GLUCOSE 125* 111* 136*  BUN 50* 42* 27*  CREATININE 2.08* 1.86* 1.24*  CALCIUM 9.3 9.1 9.2  GFRNONAA 23* 26* 43*  GFRAA 27* 31* 50*  ANIONGAP 11 10 7      Hematology Recent Labs  Lab 05/27/17 0855 05/28/17 0324 05/29/17 0549  WBC 9.2 8.8 10.0  RBC 4.40 3.83* 4.05  HGB 12.6 11.1* 11.8*  HCT 38.9 34.3* 36.0  MCV 88.4 89.6 88.9  MCH 28.6 29.0 29.1  MCHC 32.4 32.4 32.8  RDW 17.6* 17.6* 17.8*  PLT 311 236 255    Cardiac EnzymesNo results for input(s): TROPONINI in the last 168 hours. No results for input(s): TROPIPOC in the last 168 hours.   BNPNo results for input(s): BNP, PROBNP in the last 168 hours.   DDimer No results for input(s): DDIMER in the last 168 hours.   Radiology    No results found.  Cardiac Studies   05/13/2017 LEA Doppler IMPRESSION: 1. Abnormal monophasic waveforms in the  right popliteal artery and tibial runoff without focal obstructive lesion identified. 2. Monophasic waveforms in the left tibial runoff with high-grade stenosis in the proximal anterior tibial artery.  04/08/2017 CATH Conclusion     Ost LM to LM lesion, 25 %stenosed.  Prox Cx lesion, 25 %stenosed.  2nd Mrg lesion, 99 %stenosed. SVG to OM is occluded.  Ost 2nd Mrg to 2nd Mrg lesion, 75 %stenosed.  Mid Cx lesion, 25 %stenosed.  Prox RCA lesion, 80 %stenosed.  Dist RCA lesion, 100 %stenosed. SVG to PLA is patent.  Mid LAD lesion, 100 %stenosed. LIMA to Lad is occluded  LV end diastolic pressure is moderately elevated.  There is no aortic valve stenosis.  Hemodynamic findings consistent with severe pulmonary hypertension.  Ao sat: 95%, PA sat 61%,  CO 4.7 L/min; CI 2.57  Severe three vessel CAD. 1/3 grafts patent. Severe pulmonary hypertension.   Plan diuresis and management from the heart failure team. EF known to be severely reduced.  Could consider PCI of the RCA that feeds the PDA. I think her DOE is related to her pulmonary HTN, volume overload rather than her RCA stenosis.   Fick Cardiac Output 4.75 L/min  Fick Cardiac Output Index 2.57 (L/min)/BSA  RA A Wave 20 mmHg  RA V Wave 24 mmHg  RA Mean 21 mmHg  RV Systolic Pressure 63 mmHg  RV Diastolic Pressure 17 mmHg  RV EDP 19 mmHg  PA Systolic Pressure 74 mmHg  PA Diastolic Pressure 36 mmHg  PA Mean 57 mmHg  PW A Wave 29 mmHg  PW V Wave 28 mmHg  PW Mean 28 mmHg  AO Systolic Pressure 129 mmHg  AO Diastolic Pressure 67 mmHg  AO Mean 89 mmHg  LV Systolic Pressure 139 mmHg  LV Diastolic Pressure 21 mmHg  LV EDP 23 mmHg       04/12/2017 TEE  - Left ventricle: Systolic function was severely reduced. The estimated ejection fraction was in the range of 20% to 25%. Diffuse hypokinesis. No evidence of thrombus. - Aortic valve: No evidence of vegetation. - Mitral valve: No evidence of vegetation. There was mild regurgitation. - Left atrium: The atrium was moderately to severely dilated. No evidence of thrombus in the atrial cavity or appendage. No evidence of thrombus in the atrial cavity or appendage. - Right ventricle: The cavity size was dilated. Systolic function was severely reduced. - Right atrium: The atrium was severely dilated. - Atrial septum: No defect or patent foramen ovale was identified. - Tricuspid valve: No evidence of vegetation. There was moderate regurgitation.  Impressions:  - Successful cardioversion. No cardiac source of emboli was indentified.   Patient Profile     71 y.o. female with severe ischemic CMP, CAD s/p CABG with 1/3 grafts patent, CAD, combined systolic and diastolic HF, severe PAH, severe PAD  with resting limb ischemia, admitted for IV heaprin and for hydration due to acute renal insufficiency, prior to planned lower extremity arteriography on Monday.  Assessment & Plan    1. CHF: starting to show some signs of hypervolemia. On low dose carvedilol. ARB and spironolactone on hold. will start additional low-dose intravenous fluids tomorrow morning for angiography 2. CAD: symptomatic, severe native and graft disease 3. PAH: severe and only partly explained by left heart failure (transpulmonary gradient almost 30). 4. PAD: severe disease, mostly infrapopliteal. Concern for superimposed AF-related thrombembolism. Also may have cholesterol embolism after femoral cath in October (this could also explain delayed acute renal insufficiency). High risk for limb loss  with DM as well. For angiography on Monday, as allowed by renal function. 5. AFib:  Seems to be in sinus rhythm, definitely regular rhythm, but P waves are very indistinct on ECG. On amiodarone and carvedilol. Eliquis on hold for cath, on IV heparin. 6. ARF:  rapid improvement in renal function suggests that this was due to volume status, not cholesterol embolism. High risk for contrast nephrotoxicity.    For questions or updates, please contact [facility] Please consult [email or web address] for contact info under Cardiology/STEMI.      Signed, Thurmon FairMihai Jaidence Geisler, MD  05/29/2017, 9:42 AM

## 2017-05-29 NOTE — Progress Notes (Signed)
Progress Note  Patient Name: [name] Date of Encounter: 05/29/2017  Primary Cardiologist: McDowell/Berry  Subjective   She feels that she is becoming a little edematous but does not have any shortness of breath either at rest or in supine position. Continues to have resting foot pain, better with dangling feet at the side of the bed.  Creatinine continues to improve, down to 1.2, approaching baseline of 0.9  Inpatient Medications    Scheduled Meds: . amiodarone  200 mg Oral Daily  . carvedilol  3.125 mg Oral BID  . levothyroxine  175 mcg Oral QAC breakfast  . magnesium oxide  200 mg Oral Daily  . pantoprazole  40 mg Oral Q2200   Continuous Infusions: . sodium chloride 10 mL/hr at 05/28/17 1022  . heparin 1,150 Units/hr (05/29/17 0656)   PRN Meds: acetaminophen, diphenhydrAMINE **AND** acetaminophen, albuterol, ALPRAZolam, morphine injection, nitroGLYCERIN, ondansetron (ZOFRAN) IV, oxymetazoline, traMADol   Vital Signs    Vitals:   05/28/17 1400 05/28/17 2147 05/29/17 0607 05/29/17 0923  BP: 129/83 117/66 110/68 112/74  Pulse:  90 86 89  Resp:  20 16   Temp:  98.4 F (36.9 C) 98.1 F (36.7 C)   TempSrc:  Oral Oral   SpO2:  98% 97%   Weight:   178 lb 11.2 oz (81.1 kg)   Height:        Intake/Output Summary (Last 24 hours) at 05/29/2017 0942 Last data filed at 05/29/2017 0610 Gross per 24 hour  Intake 580.13 ml  Output 1300 ml  Net -719.87 ml   Filed Weights   05/27/17 0500 05/28/17 0534 05/29/17 0607  Weight: 172 lb 14.4 oz (78.4 kg) 175 lb 11.2 oz (79.7 kg) 178 lb 11.2 oz (81.1 kg)    Telemetry    Normal sinus rhythm - Personally Reviewed  ECG    no new tracing - Personally Reviewed  Physical Exam  Comfortable while sitting up in chair GEN: No acute distress.   Neck: 7-8 centimeter JVD Cardiac: RRR, no murmurs, rubs, or gallops.  Respiratory: Clear to auscultation bilaterally. GI: Soft, nontender, non-distended  MS: 2+ symmetrical edema in  the feet bilaterally; No deformity. Both feet have erythematous toes and delayed capillary refill, no pulses are palpable. Neuro:  Nonfocal  Psych: Normal affect   Labs    Chemistry Recent Labs  Lab 05/27/17 0855 05/28/17 0324 05/29/17 0549  NA 130* 130* 131*  K 4.3 4.8 4.8  CL 102 101 104  CO2 17* 19* 20*  GLUCOSE 125* 111* 136*  BUN 50* 42* 27*  CREATININE 2.08* 1.86* 1.24*  CALCIUM 9.3 9.1 9.2  GFRNONAA 23* 26* 43*  GFRAA 27* 31* 50*  ANIONGAP 11 10 7      Hematology Recent Labs  Lab 05/27/17 0855 05/28/17 0324 05/29/17 0549  WBC 9.2 8.8 10.0  RBC 4.40 3.83* 4.05  HGB 12.6 11.1* 11.8*  HCT 38.9 34.3* 36.0  MCV 88.4 89.6 88.9  MCH 28.6 29.0 29.1  MCHC 32.4 32.4 32.8  RDW 17.6* 17.6* 17.8*  PLT 311 236 255    Cardiac EnzymesNo results for input(s): TROPONINI in the last 168 hours. No results for input(s): TROPIPOC in the last 168 hours.   BNPNo results for input(s): BNP, PROBNP in the last 168 hours.   DDimer No results for input(s): DDIMER in the last 168 hours.   Radiology    No results found.  Cardiac Studies   05/13/2017 LEA Doppler IMPRESSION: 1. Abnormal monophasic waveforms in the  right popliteal artery and tibial runoff without focal obstructive lesion identified. 2. Monophasic waveforms in the left tibial runoff with high-grade stenosis in the proximal anterior tibial artery.  04/08/2017 CATH Conclusion     Ost LM to LM lesion, 25 %stenosed.  Prox Cx lesion, 25 %stenosed.  2nd Mrg lesion, 99 %stenosed. SVG to OM is occluded.  Ost 2nd Mrg to 2nd Mrg lesion, 75 %stenosed.  Mid Cx lesion, 25 %stenosed.  Prox RCA lesion, 80 %stenosed.  Dist RCA lesion, 100 %stenosed. SVG to PLA is patent.  Mid LAD lesion, 100 %stenosed. LIMA to Lad is occluded  LV end diastolic pressure is moderately elevated.  There is no aortic valve stenosis.  Hemodynamic findings consistent with severe pulmonary hypertension.  Ao sat: 95%, PA sat 61%,  CO 4.7 L/min; CI 2.57  Severe three vessel CAD. 1/3 grafts patent. Severe pulmonary hypertension.   Plan diuresis and management from the heart failure team. EF known to be severely reduced.  Could consider PCI of the RCA that feeds the PDA. I think her DOE is related to her pulmonary HTN, volume overload rather than her RCA stenosis.   Fick Cardiac Output 4.75 L/min  Fick Cardiac Output Index 2.57 (L/min)/BSA  RA A Wave 20 mmHg  RA V Wave 24 mmHg  RA Mean 21 mmHg  RV Systolic Pressure 63 mmHg  RV Diastolic Pressure 17 mmHg  RV EDP 19 mmHg  PA Systolic Pressure 74 mmHg  PA Diastolic Pressure 36 mmHg  PA Mean 57 mmHg  PW A Wave 29 mmHg  PW V Wave 28 mmHg  PW Mean 28 mmHg  AO Systolic Pressure 129 mmHg  AO Diastolic Pressure 67 mmHg  AO Mean 89 mmHg  LV Systolic Pressure 139 mmHg  LV Diastolic Pressure 21 mmHg  LV EDP 23 mmHg       04/12/2017 TEE  - Left ventricle: Systolic function was severely reduced. The estimated ejection fraction was in the range of 20% to 25%. Diffuse hypokinesis. No evidence of thrombus. - Aortic valve: No evidence of vegetation. - Mitral valve: No evidence of vegetation. There was mild regurgitation. - Left atrium: The atrium was moderately to severely dilated. No evidence of thrombus in the atrial cavity or appendage. No evidence of thrombus in the atrial cavity or appendage. - Right ventricle: The cavity size was dilated. Systolic function was severely reduced. - Right atrium: The atrium was severely dilated. - Atrial septum: No defect or patent foramen ovale was identified. - Tricuspid valve: No evidence of vegetation. There was moderate regurgitation.  Impressions:  - Successful cardioversion. No cardiac source of emboli was indentified.   Patient Profile     71 y.o. female with severe ischemic CMP, CAD s/p CABG with 1/3 grafts patent, CAD, combined systolic and diastolic HF, severe PAH, severe PAD  with resting limb ischemia, admitted for IV heaprin and for hydration due to acute renal insufficiency, prior to planned lower extremity arteriography on Monday.  Assessment & Plan    1. CHF: starting to show some signs of hypervolemia. On low dose carvedilol. ARB and spironolactone on hold. will start additional low-dose intravenous fluids tomorrow morning for angiography 2. CAD: symptomatic, severe native and graft disease 3. PAH: severe and only partly explained by left heart failure (transpulmonary gradient almost 30). 4. PAD: severe disease, mostly infrapopliteal. Concern for superimposed AF-related thrombembolism. Also may have cholesterol embolism after femoral cath in October (this could also explain delayed acute renal insufficiency). High risk for limb loss  with DM as well. For angiography on Monday, as allowed by renal function. 5. AFib:  Seems to be in sinus rhythm, definitely regular rhythm, but P waves are very indistinct on ECG. On amiodarone and carvedilol. Eliquis on hold for cath, on IV heparin. 6. ARF:  rapid improvement in renal function suggests that this was due to volume status, not cholesterol embolism. High risk for contrast nephrotoxicity.    For questions or updates, please contact [facility] Please consult [email or web address] for contact info under Cardiology/STEMI.      Signed, Thurmon FairMihai Myriah Boggus, MD  05/29/2017, 9:42 AM

## 2017-05-30 ENCOUNTER — Encounter (HOSPITAL_COMMUNITY)
Admission: AD | Disposition: A | Discharge status: Home / Self Care | Payer: Self-pay | Source: Ambulatory Visit | Attending: Cardiovascular Disease

## 2017-05-30 ENCOUNTER — Other Ambulatory Visit: Payer: Self-pay

## 2017-05-30 ENCOUNTER — Encounter (HOSPITAL_COMMUNITY): Payer: Self-pay | Admitting: General Practice

## 2017-05-30 DIAGNOSIS — I70211 Atherosclerosis of native arteries of extremities with intermittent claudication, right leg: Secondary | ICD-10-CM

## 2017-05-30 HISTORY — PX: ABDOMINAL AORTOGRAM W/LOWER EXTREMITY: CATH118223

## 2017-05-30 HISTORY — PX: PERIPHERAL VASCULAR INTERVENTION: CATH118257

## 2017-05-30 LAB — CBC
HCT: 33.2 % — ABNORMAL LOW (ref 36.0–46.0)
Hemoglobin: 10.6 g/dL — ABNORMAL LOW (ref 12.0–15.0)
MCH: 28.9 pg (ref 26.0–34.0)
MCHC: 31.9 g/dL (ref 30.0–36.0)
MCV: 90.5 fL (ref 78.0–100.0)
PLATELETS: 231 10*3/uL (ref 150–400)
RBC: 3.67 MIL/uL — AB (ref 3.87–5.11)
RDW: 18.1 % — AB (ref 11.5–15.5)
WBC: 8.3 10*3/uL (ref 4.0–10.5)

## 2017-05-30 LAB — BASIC METABOLIC PANEL
ANION GAP: 10 (ref 5–15)
BUN: 31 mg/dL — ABNORMAL HIGH (ref 6–20)
CALCIUM: 9.1 mg/dL (ref 8.9–10.3)
CO2: 17 mmol/L — ABNORMAL LOW (ref 22–32)
Chloride: 107 mmol/L (ref 101–111)
Creatinine, Ser: 1.28 mg/dL — ABNORMAL HIGH (ref 0.44–1.00)
GFR, EST AFRICAN AMERICAN: 48 mL/min — AB (ref >60)
GFR, EST NON AFRICAN AMERICAN: 41 mL/min — AB (ref >60)
GLUCOSE: 115 mg/dL — AB (ref 65–99)
POTASSIUM: 4.3 mmol/L (ref 3.5–5.1)
Sodium: 134 mmol/L — ABNORMAL LOW (ref 135–145)

## 2017-05-30 LAB — POCT ACTIVATED CLOTTING TIME
Activated Clotting Time: 257 s
Activated Clotting Time: 235 s
Activated Clotting Time: 241 s

## 2017-05-30 LAB — HEPARIN LEVEL (UNFRACTIONATED): Heparin Unfractionated: >2.2 IU/mL — ABNORMAL HIGH (ref 0.30–0.70)

## 2017-05-30 LAB — APTT: APTT: 96 s — AB (ref 24–36)

## 2017-05-30 LAB — GLUCOSE, CAPILLARY: Glucose-Capillary: 111 mg/dL — ABNORMAL HIGH (ref 65–99)

## 2017-05-30 SURGERY — ABDOMINAL AORTOGRAM W/LOWER EXTREMITY
Anesthesia: LOCAL | Laterality: Right

## 2017-05-30 MED ORDER — SODIUM CHLORIDE 0.9% FLUSH
3.0000 mL | Freq: Two times a day (BID) | INTRAVENOUS | Status: DC
  Administered 2017-05-30: 3 mL via INTRAVENOUS

## 2017-05-30 MED ORDER — SODIUM CHLORIDE 0.9% FLUSH: 3.0000 mL | INTRAVENOUS | Status: DC | PRN

## 2017-05-30 MED ORDER — APIXABAN 5 MG PO TABS
5.0000 mg | ORAL_TABLET | Freq: Two times a day (BID) | ORAL | Status: DC
  Administered 2017-05-30 – 2017-05-31 (×2): 5 mg via ORAL
  Filled 2017-05-30 (×2): qty 1

## 2017-05-30 MED ORDER — FENTANYL CITRATE (PF) 100 MCG/2ML IJ SOLN
INTRAMUSCULAR | Status: AC
  Filled 2017-05-30: qty 2

## 2017-05-30 MED ORDER — HEPARIN SODIUM (PORCINE) 1000 UNIT/ML IJ SOLN
INTRAMUSCULAR | Status: AC
  Filled 2017-05-30: qty 1

## 2017-05-30 MED ORDER — HEPARIN (PORCINE) IN NACL 2-0.9 UNIT/ML-% IJ SOLN
INTRAMUSCULAR | Status: AC
  Filled 2017-05-30: qty 1000

## 2017-05-30 MED ORDER — MIDAZOLAM HCL 2 MG/2ML IJ SOLN
INTRAMUSCULAR | Status: DC | PRN
  Administered 2017-05-30: 1 mg via INTRAVENOUS

## 2017-05-30 MED ORDER — HEPARIN (PORCINE) IN NACL 2-0.9 UNIT/ML-% IJ SOLN
INTRAMUSCULAR | Status: AC | PRN
  Administered 2017-05-30: 1000 mL via INTRA_ARTERIAL

## 2017-05-30 MED ORDER — ASPIRIN EC 81 MG PO TBEC
81.0000 mg | DELAYED_RELEASE_TABLET | Freq: Every day | ORAL | Status: DC
  Administered 2017-05-31: 81 mg via ORAL
  Filled 2017-05-30: qty 1

## 2017-05-30 MED ORDER — CLOPIDOGREL BISULFATE 300 MG PO TABS
ORAL_TABLET | ORAL | Status: AC
  Filled 2017-05-30: qty 1

## 2017-05-30 MED ORDER — CLOPIDOGREL BISULFATE 75 MG PO TABS
75.0000 mg | ORAL_TABLET | Freq: Every day | ORAL | Status: DC
  Administered 2017-05-31: 75 mg via ORAL
  Filled 2017-05-30: qty 1

## 2017-05-30 MED ORDER — ONDANSETRON HCL 4 MG/2ML IJ SOLN: 4.0000 mg | Freq: Four times a day (QID) | INTRAMUSCULAR | Status: DC | PRN

## 2017-05-30 MED ORDER — SODIUM CHLORIDE 0.9 % IV SOLN: 250.0000 mL | INTRAVENOUS | Status: DC | PRN

## 2017-05-30 MED ORDER — SODIUM CHLORIDE 0.9 % IV SOLN: INTRAVENOUS | Status: AC

## 2017-05-30 MED ORDER — IODIXANOL 320 MG/ML IV SOLN
INTRAVENOUS | Status: DC | PRN
  Administered 2017-05-30: 170 mL via INTRA_ARTERIAL

## 2017-05-30 MED ORDER — MORPHINE SULFATE (PF) 4 MG/ML IV SOLN
1.0000 mg | INTRAVENOUS | Status: DC | PRN
  Administered 2017-05-30 – 2017-05-31 (×2): 2 mg via INTRAVENOUS
  Filled 2017-05-30 (×2): qty 1

## 2017-05-30 MED ORDER — CLOPIDOGREL BISULFATE 300 MG PO TABS
ORAL_TABLET | ORAL | Status: DC | PRN
  Administered 2017-05-30: 300 mg via ORAL

## 2017-05-30 MED ORDER — LIDOCAINE HCL (PF) 1 % IJ SOLN
INTRAMUSCULAR | Status: DC | PRN
  Administered 2017-05-30: 30 mL via INTRADERMAL

## 2017-05-30 MED ORDER — HYDRALAZINE HCL 20 MG/ML IJ SOLN: 5.0000 mg | INTRAMUSCULAR | Status: DC | PRN

## 2017-05-30 MED ORDER — FENTANYL CITRATE (PF) 100 MCG/2ML IJ SOLN
INTRAMUSCULAR | Status: DC | PRN
  Administered 2017-05-30: 25 ug via INTRAVENOUS

## 2017-05-30 MED ORDER — MIDAZOLAM HCL 2 MG/2ML IJ SOLN
INTRAMUSCULAR | Status: AC
  Filled 2017-05-30: qty 2

## 2017-05-30 MED ORDER — TORSEMIDE 20 MG PO TABS
40.0000 mg | ORAL_TABLET | Freq: Two times a day (BID) | ORAL | Status: DC
  Administered 2017-05-30 – 2017-05-31 (×2): 40 mg via ORAL
  Filled 2017-05-30 (×2): qty 2

## 2017-05-30 MED ORDER — LIDOCAINE HCL (PF) 1 % IJ SOLN
INTRAMUSCULAR | Status: AC
  Filled 2017-05-30: qty 30

## 2017-05-30 MED ORDER — HEPARIN SODIUM (PORCINE) 1000 UNIT/ML IJ SOLN
INTRAMUSCULAR | Status: DC | PRN
  Administered 2017-05-30: 2500 [IU] via INTRAVENOUS
  Administered 2017-05-30: 8000 [IU] via INTRAVENOUS

## 2017-05-30 MED ORDER — ACETAMINOPHEN 325 MG PO TABS
650.0000 mg | ORAL_TABLET | ORAL | Status: DC | PRN
  Administered 2017-05-31: 07:00:00 650 mg via ORAL
  Filled 2017-05-30: qty 2

## 2017-05-30 MED ORDER — LABETALOL HCL 5 MG/ML IV SOLN: 10.0000 mg | INTRAVENOUS | Status: DC | PRN

## 2017-05-30 SURGICAL SUPPLY — 28 items
BAG SNAP BAND KOVER 36X36 (MISCELLANEOUS) ×3 IMPLANT
BALLN CHOCOLATE 2.5X120X150 (BALLOONS) ×3
BALLN WOLVERINE 3.50X15 (BALLOONS) ×3
BALLOON CHOCOLATE 2.5X120X150 (BALLOONS) ×2 IMPLANT
BALLOON WOLVERINE 3.50X15 (BALLOONS) ×2 IMPLANT
CATH ANGIO 5F PIGTAIL 65CM (CATHETERS) ×3 IMPLANT
CATH QUICKCROSS .018X135CM (MICROCATHETER) ×3 IMPLANT
CATH STRAIGHT 5FR 65CM (CATHETERS) ×3 IMPLANT
CATH TEMPO 5F RIM 65CM (CATHETERS) ×3 IMPLANT
DEVICE CONTINUOUS FLUSH (MISCELLANEOUS) ×3 IMPLANT
DEVICE TORQUE .014-.018 (MISCELLANEOUS) ×2 IMPLANT
KIT ENCORE 26 ADVANTAGE (KITS) ×3 IMPLANT
KIT PV (KITS) ×3 IMPLANT
SHEATH HIGHFLEX ANSEL 7FR 55CM (SHEATH) ×3 IMPLANT
SHEATH PINNACLE 5F 10CM (SHEATH) ×3 IMPLANT
SHEATH PINNACLE 7F 10CM (SHEATH) ×3 IMPLANT
STENT SIERRA 4.00 X 18 MM (Permanent Stent) ×3 IMPLANT
STOPCOCK MORSE 400PSI 3WAY (MISCELLANEOUS) ×3 IMPLANT
SYRINGE MEDRAD AVANTA MACH 7 (SYRINGE) ×3 IMPLANT
TAPE VIPERTRACK RADIOPAQ (MISCELLANEOUS) ×2 IMPLANT
TAPE VIPERTRACK RADIOPAQUE (MISCELLANEOUS) ×1
TORQUE DEVICE .014-.018 (MISCELLANEOUS) ×3
TRANSDUCER W/STOPCOCK (MISCELLANEOUS) ×3 IMPLANT
TRAY PV CATH (CUSTOM PROCEDURE TRAY) ×3 IMPLANT
TUBING CIL FLEX 10 FLL-RA (TUBING) ×3 IMPLANT
WIRE HITORQ VERSACORE ST 145CM (WIRE) ×3 IMPLANT
WIRE ROSEN-J .035X260CM (WIRE) ×3 IMPLANT
WIRE SPARTACORE .014X300CM (WIRE) ×3 IMPLANT

## 2017-05-30 NOTE — Progress Notes (Signed)
Advanced Heart Failure Rounding Note  Primary Cardiologist: Dr. McDowell/Berry Primary HF: Dr. Gala RomneyBensimhon   Subjective:    Feeling anxious for procedure this am. RLE pain relatively unchanged. Denies SOB. Weight up a few lbs, and started in to feel like she has some edema.   Creatinine down to baseline (1.28 this am). Weight only up 3 lbs from admission.   Objective:   Weight Range: 175 lb 6.4 oz (79.6 kg) Body mass index is 31.07 kg/m.   Vital Signs:   Temp:  [98 F (36.7 C)-98.5 F (36.9 C)] 98 F (36.7 C) (12/03 0551) Pulse Rate:  [87-90] 89 (12/03 0849) Resp:  [18-19] 19 (12/03 0551) BP: (110-132)/(66-87) 112/72 (12/03 0849) SpO2:  [91 %-97 %] 97 % (12/03 0551) Weight:  [175 lb 6.4 oz (79.6 kg)] 175 lb 6.4 oz (79.6 kg) (12/03 0551) Last BM Date: 05/29/17  Weight change: Filed Weights   05/28/17 0534 05/29/17 0607 05/30/17 0551  Weight: 175 lb 11.2 oz (79.7 kg) 178 lb 11.2 oz (81.1 kg) 175 lb 6.4 oz (79.6 kg)    Intake/Output:   Intake/Output Summary (Last 24 hours) at 05/30/2017 1101 Last data filed at 05/30/2017 0548 Gross per 24 hour  Intake 960.85 ml  Output 300 ml  Net 660.85 ml      Physical Exam    General:  Well appearing. No resp difficulty HEENT: Normal Neck: Supple. JVP 5-6 cm. Carotids 2+ bilat; no bruits. No lymphadenopathy or thyromegaly appreciated. Cor: PMI nondisplaced. Regular rate & rhythm. No rubs, gallops or murmurs. Lungs: Clear Abdomen: Soft, nontender, nondistended. No hepatosplenomegaly. No bruits or masses. Good bowel sounds. Extremities: No cyanosis or clubbing. BLE toes red and tender.  Neuro: Alert & orientedx3, cranial nerves grossly intact. moves all 4 extremities w/o difficulty. Affect pleasant  Telemetry   NSR 80-90s, Personally reviewed  EKG    N/A  Labs    CBC Recent Labs    05/29/17 0549 05/30/17 0223  WBC 10.0 8.3  HGB 11.8* 10.6*  HCT 36.0 33.2*  MCV 88.9 90.5  PLT 255 231   Basic Metabolic  Panel Recent Labs    05/29/17 0549 05/30/17 0223  NA 131* 134*  K 4.8 4.3  CL 104 107  CO2 20* 17*  GLUCOSE 136* 115*  BUN 27* 31*  CREATININE 1.24* 1.28*  CALCIUM 9.2 9.1   Liver Function Tests No results for input(s): AST, ALT, ALKPHOS, BILITOT, PROT, ALBUMIN in the last 72 hours. No results for input(s): LIPASE, AMYLASE in the last 72 hours. Cardiac Enzymes No results for input(s): CKTOTAL, CKMB, CKMBINDEX, TROPONINI in the last 72 hours.  BNP: BNP (last 3 results) Recent Labs    12/24/16 0824  BNP 385.0*    ProBNP (last 3 results) No results for input(s): PROBNP in the last 8760 hours.   D-Dimer No results for input(s): DDIMER in the last 72 hours. Hemoglobin A1C No results for input(s): HGBA1C in the last 72 hours. Fasting Lipid Panel No results for input(s): CHOL, HDL, LDLCALC, TRIG, CHOLHDL, LDLDIRECT in the last 72 hours. Thyroid Function Tests No results for input(s): TSH, T4TOTAL, T3FREE, THYROIDAB in the last 72 hours.  Invalid input(s): FREET3  Other results:   Imaging     No results found.   Medications:     Scheduled Medications: . amiodarone  200 mg Oral Daily  . carvedilol  3.125 mg Oral BID  . levothyroxine  175 mcg Oral QAC breakfast  . magnesium oxide  200 mg  Oral Daily  . pantoprazole  40 mg Oral Q2200     Infusions: . sodium chloride Stopped (05/30/17 0453)  . sodium chloride 50 mL/hr at 05/30/17 0453  . heparin 1,150 Units/hr (05/29/17 0656)     PRN Medications:  acetaminophen, diphenhydrAMINE **AND** acetaminophen, albuterol, ALPRAZolam, morphine injection, nitroGLYCERIN, ondansetron (ZOFRAN) IV, oxymetazoline, traMADol    Patient Profile   [name] is 71 year old with a h/o CAD, S/P CABG 2003, chronic systolic/diastolic heart failure, HTN, PAF with DC/CV 04/12/2017 and 05/02/2017 , hyperlipidemia, hypothyroidism admitted for optimization prio for lower extremity angio.   Assessment/Plan   1.RLE limb ischemia-   - Plan for LE angio today. Renal function stable to improved.  2. Chronic Systolic Heart Failure ICM. ECHO EF 25-30%.  - Lasix, losartan, and spiro on hold.  - Creatinine stable at 1.28 this am.  - Continue carvedilol 3.125 mg BID.  3. PAF - DC-CV 03/2017 and 04/2017 - Remains in NSR on po amiodarone 200 mg daily.  - On heparin.  4. AKI on CKD Creatinine baseline 1.2-1.4 - Creatinine back to baseline with gentle IVF and holding diuretics.  5.CAD  CABG in 2003. S/P LHC 10/18 1/3 grafts patent. No targets for intervention - No s/s of ischemia.    - Continue statin and BB.   Medication concerns reviewed with patient and pharmacy team. Barriers identified: None at this time.   Length of Stay: 4  [name], [name]  05/30/2017, 11:01 AM  Advanced Heart Failure Team Pager [phone] (M-F; 7a - 4p)  Please contact [facility] for night-coverage after hours (4p -7a ) and weekends on [email or web address]  Patient seen and examined with the above-signed Advanced Practice Provider and/or Housestaff. I personally reviewed laboratory data, imaging studies and relevant notes. I independently examined the patient and formulated the important aspects of the plan. I have edited the note to reflect any of my changes or salient points. I have personally discussed the plan with the patient and/or family.  Doing well. Renal function much improved. Excellent result with PCI of PAD. Can restart diuretics. Will need triple therapy with DAPT and DOAC for 1 month. Then can drop ASA at 30 days.   [name] Meresaniel Leightyn Cina, MD  3:44 PM

## 2017-05-30 NOTE — Interval H&P Note (Signed)
History and Physical Interval Note:  05/30/2017 11:34 AM  Don PerkingMary G Sonnenberg  has presented today for surgery, with the diagnosis of claudication  The various methods of treatment have been discussed with the patient and family. After consideration of risks, benefits and other options for treatment, the patient has consented to  Procedure(s): ABDOMINAL AORTOGRAM W/LOWER EXTREMITY (Right) as a surgical intervention .  The patient's history has been reviewed, patient examined, no change in status, stable for surgery.  I have reviewed the patient's chart and labs.  Questions were answered to the patient's satisfaction.     Nanetta BattyJonathan Henretta Quist

## 2017-05-30 NOTE — Care Management Important Message (Signed)
Important Message  Patient Details  Name: [name] LoaMary G [name] MRN: [id number] Date of Birth: 12-Sep-1945   Medicare Important Message Given:  Yes    [name], [name] 05/30/2017, 10:58 AM

## 2017-05-30 NOTE — Progress Notes (Signed)
ANTICOAGULATION CONSULT NOTE - Follow Up Consult  Pharmacy Consult for  Heparin Indication: atrial fibrillation  Allergies  Allergen Reactions  . Oxycodone Other (See Comments)    hallucinations   . Pollen Extract Other (See Comments)    Sneezing, running nose.  . Ranexa [Ranolazine] Nausea Only    Headache and nausea  . Statins Other (See Comments)    Cramping, turns skin yellow    Patient Measurements: Height: 5\' 3"  (160 cm) Weight: 175 lb 6.4 oz (79.6 kg) IBW/kg (Calculated) : 52.4 Heparin Dosing Weight: 70 kg  Vital Signs: Temp: 98 F (36.7 C) (12/03 0551) Temp Source: Oral (12/03 0551) BP: 112/72 (12/03 0849) Pulse Rate: 89 (12/03 0849)  Labs: Recent Labs    05/28/17 0324 05/29/17 0549 05/29/17 1509 05/29/17 2152 05/30/17 0223  HGB 11.1* 11.8*  --   --  10.6*  HCT 34.3* 36.0  --   --  33.2*  PLT 236 255  --   --  231  APTT 74* 63* 75*  --  96*  LABPROT  --   --   --  15.4*  --   INR  --   --   --  1.23  --   HEPARINUNFRC >2.20* 1.86*  --   --  >2.20*  CREATININE 1.86* 1.24*  --   --  1.28*    Estimated Creatinine Clearance: 40.9 mL/min (A) (by C-G formula based on SCr of 1.28 mg/dL (H)).   . sodium chloride Stopped (05/30/17 0453)  . sodium chloride 50 mL/hr at 05/30/17 0453  . heparin 1,150 Units/hr (05/29/17 [id number])     Assessment:  71 yr old female on IV heparin for atrial fibrillation.  On Eliquis prior to admission, holding for angiography, which has been postponed until today due to renal function.   Last Eliquis dose 11/27 am.  Received PTA Lovenox 80 sq on 11/27 pm and 11/28 am.  Using aPTTs for heparin monitoring while recent Eliquis doses are effecting heparin levels.  APTT is now therapeutic on heparin drip at 1150 units/hr.  Heparin level remains falsely elevated due to recent Eliquis, although it's unusual that the heparin level is trending up despite no Eliquis since 11/27.  Goal of Therapy:  Heparin level 0.3-0.7 units/ml aPTT 66-102  seconds Monitor platelets by anticoagulation protocol: Yes   Plan:   Continue heparin drip at 1150 units/hr.  Daily aPTT, heparin level and CBC.  Eliquis on hold for angiography on 12/3.  Tad MooreJessica Aristea Posada, Pharm D, BCPS  Clinical Pharmacist Pager [phone]  05/30/2017 11:05 AM

## 2017-05-30 NOTE — Progress Notes (Signed)
Progress Note  Patient Name: [name] [name] Date of Encounter: 05/30/2017  Primary Cardiologist: McDowell/Berry  Subjective   Pt a bit anxious this AM due to procedure schedule for 11:30am. Complains of GI upset overnight related to morphine administration. She is having significant pain in her right foot. Ready to proceed with procedure today and go home.   Inpatient Medications    Scheduled Meds: . amiodarone  200 mg Oral Daily  . carvedilol  3.125 mg Oral BID  . levothyroxine  175 mcg Oral QAC breakfast  . magnesium oxide  200 mg Oral Daily  . pantoprazole  40 mg Oral Q2200   Continuous Infusions: . sodium chloride Stopped (05/30/17 0453)  . sodium chloride 50 mL/hr at 05/30/17 0453  . heparin 1,150 Units/hr (05/29/17 0656)   PRN Meds: acetaminophen, diphenhydrAMINE **AND** acetaminophen, albuterol, ALPRAZolam, morphine injection, nitroGLYCERIN, ondansetron (ZOFRAN) IV, oxymetazoline, traMADol   Vital Signs    Vitals:   05/29/17 1300 05/29/17 2155 05/30/17 0551 05/30/17 0849  BP: 132/87 113/72 110/66 112/72  Pulse: 90 88 87 89  Resp: 18  19   Temp: 98.5 F (36.9 C) 98 F (36.7 C) 98 F (36.7 C)   TempSrc: Axillary Oral Oral   SpO2: 91% 96% 97%   Weight:   175 lb 6.4 oz (79.6 kg)   Height:        Intake/Output Summary (Last 24 hours) at 05/30/2017 0945 Last data filed at 05/30/2017 0548 Gross per 24 hour  Intake 960.85 ml  Output 300 ml  Net 660.85 ml   Filed Weights   05/28/17 0534 05/29/17 0607 05/30/17 0551  Weight: 175 lb 11.2 oz (79.7 kg) 178 lb 11.2 oz (81.1 kg) 175 lb 6.4 oz (79.6 kg)    Physical Exam   General: [name] developed, [name] nourished, NAD Skin: Reddened lower right and left extremity with clear oozing from open sores   Head: Normocephalic, atraumatic, clear, moist mucus membranes. Neck: Negative for carotid bruits. No JVD Lungs:Clear to ausculation bilaterally. No wheezes, rales, or rhonchi. Breathing is unlabored. Cardiovascular:  RRR with S1 S2. No murmurs, rubs, or gallops. Abdomen: Soft, non-tender, non-distended with normoactive bowel sounds.  No obvious abdominal masses. MSK: Strength and tone appear normal for age. 5/5 in all extremities Extremities: 1+ bilateral LE edema. No clubbing or cyanosis. DP/PT pulses 1+ bilaterally Neuro: Alert and oriented. Anxious. No focal deficits. No facial asymmetry. MAE spontaneously. Psych: Responds to questions appropriately with normal affect.    Labs    Chemistry Recent Labs  Lab 05/28/17 0324 05/29/17 0549 05/30/17 0223  NA 130* 131* 134*  K 4.8 4.8 4.3  CL 101 104 107  CO2 19* 20* 17*  GLUCOSE 111* 136* 115*  BUN 42* 27* 31*  CREATININE 1.86* 1.24* 1.28*  CALCIUM 9.1 9.2 9.1  GFRNONAA 26* 43* 41*  GFRAA 31* 50* 48*  ANIONGAP 10 7 10      Hematology Recent Labs  Lab 05/28/17 0324 05/29/17 0549 05/30/17 0223  WBC 8.8 10.0 8.3  RBC 3.83* 4.05 3.67*  HGB 11.1* 11.8* 10.6*  HCT 34.3* 36.0 33.2*  MCV 89.6 88.9 90.5  MCH 29.0 29.1 28.9  MCHC 32.4 32.8 31.9  RDW 17.6* 17.8* 18.1*  PLT 236 255 231    Cardiac EnzymesNo results for input(s): TROPONINI in the last 168 hours. No results for input(s): TROPIPOC in the last 168 hours.   BNPNo results for input(s): BNP, PROBNP in the last 168 hours.   DDimer No results  for input(s): DDIMER in the last 168 hours.   Radiology    No results found.  Telemetry     05/30/17 SR w/ 1st AV block. In/ot of Afib overnight. - Personally Reviewed  ECG     05/29/2017 Atrial Fib? SR with indistinct P waves- Personally Reviewed  Cardiac Studies   05/13/2017 LEA Doppler IMPRESSION: 1. Abnormal monophasic waveforms in the right popliteal artery and tibial runoff without focal obstructive lesion identified. 2. Monophasic waveforms in the left tibial runoff with high-grade stenosis in the proximal anterior tibial artery.  04/08/2017 CATH Conclusion     Ost LM to LM lesion, 25 %stenosed.  Prox Cx lesion,  25 %stenosed.  2nd Mrg lesion, 99 %stenosed. SVG to OM is occluded.  Ost 2nd Mrg to 2nd Mrg lesion, 75 %stenosed.  Mid Cx lesion, 25 %stenosed.  Prox RCA lesion, 80 %stenosed.  Dist RCA lesion, 100 %stenosed. SVG to PLA is patent.  Mid LAD lesion, 100 %stenosed. LIMA to Lad is occluded  LV end diastolic pressure is moderately elevated.  There is no aortic valve stenosis.  Hemodynamic findings consistent with severe pulmonary hypertension.  Ao sat: 95%, PA sat 61%, CO 4.7 L/min; CI 2.57  Severe three vessel CAD. 1/3 grafts patent. Severe pulmonary hypertension.   Plan diuresis and management from the heart failure team. EF known to be severely reduced.  Could consider PCI of the RCA that feeds the PDA. I think her DOE is related to her pulmonary HTN, volume overload rather than her RCA stenosis.  Fick Cardiac Output 4.75 L/min  Fick Cardiac Output Index 2.57 (L/min)/BSA  RA A Wave 20 mmHg  RA V Wave 24 mmHg  RA Mean 21 mmHg  RV Systolic Pressure 63 mmHg  RV Diastolic Pressure 17 mmHg  RV EDP 19 mmHg  PA Systolic Pressure 74 mmHg  PA Diastolic Pressure 36 mmHg  PA Mean 57 mmHg  PW A Wave 29 mmHg  PW V Wave 28 mmHg  PW Mean 28 mmHg  AO Systolic Pressure 129 mmHg  AO Diastolic Pressure 67 mmHg  AO Mean 89 mmHg  LV Systolic Pressure 139 mmHg  LV Diastolic Pressure 21 mmHg  LV EDP 23 mmHg       04/12/2017 TEE  - Left ventricle: Systolic function was severely reduced. The estimated ejection fraction was in the range of 20% to 25%. Diffuse hypokinesis. No evidence of thrombus. - Aortic valve: No evidence of vegetation. - Mitral valve: No evidence of vegetation. There was mild regurgitation. - Left atrium: The atrium was moderately to severely dilated. No evidence of thrombus in the atrial cavity or appendage. No evidence of thrombus in the atrial cavity or appendage. - Right ventricle: The cavity size was dilated. Systolic  function was severely reduced. - Right atrium: The atrium was severely dilated. - Atrial septum: No defect or patent foramen ovale was identified. - Tricuspid valve: No evidence of vegetation. There was moderate regurgitation.  Impressions:  - Successful cardioversion. No cardiac source of emboli was indentified.  Patient Profile     71 y.o. female with severe ischemic CMP, CAD s/p CABG with 1/3 grafts patent, CAD, combined systolic and diastolic HF, severe PAH, severe PAD with resting limb ischemia, admitted for IV heaprin and for hydration due to acute renal insufficiency, prior to planned lower extremity arteriography on Monday.  Assessment & Plan    1. PAD: - Per Dr. Royann Shiversroitoru note 05/29/17, pt at hight risk for limb loss due to severe peripheral vascular  disease.  -She is for lower extremity angiography per Dr. Allyson SabalBerry today 05/30/17. -Pt has been pre-hydrated since admission due to AKI with improving status -Continue Hep gtt, Eliquis on hold -BUN/Cr improving. 31/1.28  2. CHF: - Weight today 05/30/17 175lb, down from 178lb on 05/29/17. Baseline appears to be in the range of 170-172lb. -Net +1.8L since admission  -Continues to show signs of volume overload with 1+ pitting lower extremity edema.  -Spironolactone and Cozaar not ordered from admission.  -Continue gentle hydration, although weight is up some, respiratory status is good.  -Prehydrating with 6350ml/hr   3. Afib -Continue amiodarone 200mg  PO QD and Carvedilol 3.125mg  PO BID  -Pt currently in regular SR, indistinct P-waves with paroxysmal Afib -Continue IV heparin, Eliguis on hold for procedure   4. ARF: -Continue IV hydration for now.  -Will need to address fluid volume status post procedure and monitor renal function closely as she is at high risk for contrast nephrotoxicity -Cr improving. 05/30/17-1.28, 05/29/17-1.24, 05/28/17-1.86, 11/30-2.08  SignedGeorgie Chard, Jill McDaniel, NP  05/30/2017, 9:45 AM   For questions or  updates, please contact   Please consult [email or web address] for contact info under Cardiology/STEMI.  Personally seen and examined. Agree with above.  Awaiting PV procedure.  Continuing to pre-hydrate.  Monitoring her atrial fibrillation.  On amiodarone.  Continue to watch creatinine.  Improving.  Alert, lungs are clear, mild edema noted. Peripheral arterial disease- procedure later today.  Donato SchultzMark Lucious Zou, MD

## 2017-05-31 ENCOUNTER — Encounter (HOSPITAL_COMMUNITY): Payer: Self-pay | Admitting: Cardiovascular Disease

## 2017-05-31 DIAGNOSIS — I5022 Chronic systolic (congestive) heart failure: Secondary | ICD-10-CM

## 2017-05-31 LAB — BASIC METABOLIC PANEL
ANION GAP: 11 (ref 5–15)
BUN: 24 mg/dL — AB (ref 6–20)
CALCIUM: 9.1 mg/dL (ref 8.9–10.3)
CO2: 19 mmol/L — ABNORMAL LOW (ref 22–32)
CREATININE: 1.24 mg/dL — AB (ref 0.44–1.00)
Chloride: 103 mmol/L (ref 101–111)
GFR calc Af Amer: 50 mL/min — ABNORMAL LOW (ref >60)
GFR, EST NON AFRICAN AMERICAN: 43 mL/min — AB (ref >60)
GLUCOSE: 92 mg/dL (ref 65–99)
Potassium: 4 mmol/L (ref 3.5–5.1)
Sodium: 133 mmol/L — ABNORMAL LOW (ref 135–145)

## 2017-05-31 LAB — CBC
HCT: 32.7 % — ABNORMAL LOW (ref 36.0–46.0)
Hemoglobin: 10.4 g/dL — ABNORMAL LOW (ref 12.0–15.0)
MCH: 28.7 pg (ref 26.0–34.0)
MCHC: 31.8 g/dL (ref 30.0–36.0)
MCV: 90.3 fL (ref 78.0–100.0)
PLATELETS: 232 10*3/uL (ref 150–400)
RBC: 3.62 MIL/uL — ABNORMAL LOW (ref 3.87–5.11)
RDW: 18.4 % — AB (ref 11.5–15.5)
WBC: 7.6 10*3/uL (ref 4.0–10.5)

## 2017-05-31 LAB — APTT: aPTT: 31 seconds (ref 24–36)

## 2017-05-31 MED ORDER — LOSARTAN POTASSIUM 25 MG PO TABS
12.5000 mg | ORAL_TABLET | Freq: Every day | ORAL | Status: DC
  Administered 2017-05-31: 12.5 mg via ORAL
  Filled 2017-05-31: qty 0.5

## 2017-05-31 MED ORDER — CLOPIDOGREL BISULFATE 75 MG PO TABS: 75.0000 mg | ORAL_TABLET | Freq: Every day | ORAL | 11 refills | Status: AC

## 2017-05-31 MED ORDER — LOSARTAN POTASSIUM 50 MG PO TABS: 25.0000 mg | ORAL_TABLET | Freq: Every day | ORAL | Status: DC

## 2017-05-31 MED ORDER — PANTOPRAZOLE SODIUM 40 MG PO TBEC: 40.0000 mg | DELAYED_RELEASE_TABLET | Freq: Every day | ORAL | 3 refills | Status: DC

## 2017-05-31 MED ORDER — POTASSIUM CHLORIDE CRYS ER 20 MEQ PO TBCR
20.0000 meq | EXTENDED_RELEASE_TABLET | Freq: Two times a day (BID) | ORAL | Status: DC
  Administered 2017-05-31: 10:00:00 20 meq via ORAL
  Filled 2017-05-31: qty 1

## 2017-05-31 MED ORDER — ASPIRIN 81 MG PO TBEC: 81.0000 mg | DELAYED_RELEASE_TABLET | Freq: Every day | ORAL | Status: DC

## 2017-05-31 MED ORDER — ANGIOPLASTY BOOK
Freq: Once | Status: AC
  Administered 2017-05-31: 02:00:00 1
  Filled 2017-05-31: qty 1

## 2017-05-31 NOTE — Progress Notes (Signed)
Progress Note  Patient Name: [name] Date of Encounter: 05/31/2017  Primary Cardiologist: Dr. McDowell/Berry Primary HF: Dr. Gala RomneyBensimhon  Subjective   Pt doing very well this morning. Ready to go home as expected. No issues over night. Groin site stable. Left LE pain better today.   Inpatient Medications    Scheduled Meds: . amiodarone  200 mg Oral Daily  . apixaban  5 mg Oral BID  . aspirin EC  81 mg Oral Daily  . carvedilol  3.125 mg Oral BID  . clopidogrel  75 mg Oral Q breakfast  . levothyroxine  175 mcg Oral QAC breakfast  . magnesium oxide  200 mg Oral Daily  . pantoprazole  40 mg Oral Q2200  . sodium chloride flush  3 mL Intravenous Q12H  . torsemide  40 mg Oral BID   Continuous Infusions: . sodium chloride Stopped (05/30/17 0453)  . sodium chloride 50 mL/hr at 05/30/17 0453  . sodium chloride     PRN Meds: sodium chloride, diphenhydrAMINE **AND** acetaminophen, acetaminophen, albuterol, ALPRAZolam, hydrALAZINE, labetalol, morphine injection, nitroGLYCERIN, ondansetron (ZOFRAN) IV, oxymetazoline, sodium chloride flush, traMADol   Vital Signs    Vitals:   05/30/17 1945 05/31/17 0000 05/31/17 0124 05/31/17 0502  BP: (!) 102/44   101/67  Pulse: 92   90  Resp: 19 15  13   Temp: (!) 97.5 F (36.4 C)   (!) 97.5 F (36.4 C)  TempSrc: Oral   Oral  SpO2: 96%   93%  Weight:   180 lb 8.9 oz (81.9 kg)   Height:        Intake/Output Summary (Last 24 hours) at 05/31/2017 0803 Last data filed at 05/31/2017 0648 Gross per 24 hour  Intake 776.3 ml  Output 3051 ml  Net -2274.7 ml   Filed Weights   05/29/17 0607 05/30/17 0551 05/31/17 0124  Weight: 178 lb 11.2 oz (81.1 kg) 175 lb 6.4 oz (79.6 kg) 180 lb 8.9 oz (81.9 kg)    Physical Exam   General: Well developed, well nourished, NAD Skin: Reddened LE right and left with minimal oozing from toes.  Head: Normocephalic, atraumatic, clear, moist mucus membranes. Neck: Supple. Mild JVD in upright position.    Lungs:Clear to ausculation bilaterally. No wheezes, rales, or rhonchi. Breathing is unlabored. Cardiovascular: Irregular with S1 S2. No murmurs, rubs, or gallops. Abdomen: Soft, non-tender, non-distended with normoactive bowel sounds. No obvious abdominal masses. MSK: Strength and tone appear normal for age. 5/5 in all extremities Extremities: 2+ edema RLE, 1+ LLE. No clubbing or cyanosis. DP/PT pulses 1+ bilaterally Neuro: Alert and oriented. No focal deficits. No facial asymmetry. MAE spontaneously. Psych: Responds to questions appropriately with normal affect.    Labs    Chemistry Recent Labs  Lab 05/29/17 0549 05/30/17 0223 05/31/17 0454  NA 131* 134* 133*  K 4.8 4.3 4.0  CL 104 107 103  CO2 20* 17* 19*  GLUCOSE 136* 115* 92  BUN 27* 31* 24*  CREATININE 1.24* 1.28* 1.24*  CALCIUM 9.2 9.1 9.1  GFRNONAA 43* 41* 43*  GFRAA 50* 48* 50*  ANIONGAP 7 10 11      Hematology Recent Labs  Lab 05/29/17 0549 05/30/17 0223 05/31/17 0454  WBC 10.0 8.3 7.6  RBC 4.05 3.67* 3.62*  HGB 11.8* 10.6* 10.4*  HCT 36.0 33.2* 32.7*  MCV 88.9 90.5 90.3  MCH 29.1 28.9 28.7  MCHC 32.8 31.9 31.8  RDW 17.8* 18.1* 18.4*  PLT 255 231 232    Cardiac  EnzymesNo results for input(s): TROPONINI in the last 168 hours. No results for input(s): TROPIPOC in the last 168 hours.   BNPNo results for input(s): BNP, PROBNP in the last 168 hours.   DDimer No results for input(s): DDIMER in the last 168 hours.   Radiology    No results found.  Telemetry    05/31/2017- Afib with occasional PVC's 90 - Personally Reviewed  ECG     05/29/2017-Atrial Fibrillation HR 90 - Personally Reviewed  Cardiac Studies   04/12/2017 TEE with Cardioversion: Study Conclusions  - Left ventricle: Systolic function was severely reduced. The   estimated ejection fraction was in the range of 20% to 25%.   Diffuse hypokinesis. No evidence of thrombus. - Aortic valve: No evidence of vegetation. - Mitral valve: No  evidence of vegetation. There was mild   regurgitation. - Left atrium: The atrium was moderately to severely dilated. No   evidence of thrombus in the atrial cavity or appendage. No   evidence of thrombus in the atrial cavity or appendage. - Right ventricle: The cavity size was dilated. Systolic function   was severely reduced. - Right atrium: The atrium was severely dilated. - Atrial septum: No defect or patent foramen ovale was identified. - Tricuspid valve: No evidence of vegetation. There was moderate   regurgitation.  Impressions:  - Successful cardioversion. No cardiac source of emboli was   indentified.   05/29/2017 PV Angiogram/Intervention:  Successful cutting balloon atherectomy followed by drug-eluting stenting of the P3 segment of the right below-the-knee popliteal artery reducing 99% stenosis to 0% residual.The patient will be treated with dual antiplatelet therapy in addition to Eliquis s for 1 month after which time aspirin can be discontinued.   Patient Profile     71 y.o. femalewith severe ischemic CMP, CAD s/p CABG with 1/3 grafts patent, CAD, combined systolic and diastolic HF, severe PAH, severe PAD with resting limb ischemia, admitted for IV heaprin and for hydration due to acute renal insufficiency, prior to planned lower extremity arteriography on Monday.  Assessment & Plan    1. RLE limb ischemia:  -LE angio with intervention 05/29/2017. Cutting balloon and DES of the P3 segment of the right below-the-knee popliteal artery. 99%>0%  -Per MD procedure note, pt will need dual antiplatelet therapy for approximately 1 month, then ASA can be d/c'ed.  -Remain on Eliquis for PAF  -Post-procedure renal function stable. Cr=1.24 today. 05/29/2017-1.28  2. Chronic systolic HF: -TEE 04/12/2017- EF 25-30% -Per HF note, pt can be restarted on diuretics -Will need to additionally restart ARB, BB -Increased bilateral LE swelling R>L  3. PAF: -DC-CV 03/2017 and  04/2017 without success -Currently in AF, rate controlled -Remains on Eliquis, PO Amiodarone 200mg  QD  4. Acute on Chronic Kidney injury: -Creatinine back at baseline post-procedure. Cr today 1.24 -Diuretics currently on hold, however, will need to be restarted -Consider rechecking BMET at follow up visit  5. CAD: -CABG 2003, s/p LHC 03/2017> 1/3 grafts patent.  -No CP -Continue BB and statin therapy  Signed, Georgie ChardJill McDaniel, NP  05/31/2017, 8:03 AM    For questions or updates, please contact   Please consult [email or web address] for contact info under Cardiology/STEMI.  Personally seen and examined. Agree with above. 71 year old with Cutting Balloon DES placement of the P3 segment right below knee popliteal artery, dual antiplatelet therapy for at least one month and aspirin can be discontinued, remain on Eliquis for persistent atrial fibrillation, unsuccessful cardioversion.  Still on p.o. amiodarone 200 mg once a  day.  Unless there are further plans for potential cardioversion in the future, one could consider discontinuation of antiarrhythmic.  Creatinine stable 1.24. CAD is stable, post CABG remotely. Exam: Alert, regular rate and rhythm, lungs are clear, cath site clean dry and intact. Hold spironolactone, okay to resume Lasix.  She has appointment in advanced heart failure clinic in 1 week.  Okay for discharge. Donato Schultz, MD

## 2017-05-31 NOTE — Care Management Note (Signed)
Case Management Note  Patient Details  Name: [name] MRN: [id number] Date of Birth: [birth year]  Subjective/Objective:  From home , s/p pv intervention, will be on plavix, already on eliquis.                  Action/Plan: NCM will follow for dc needs.   Expected Discharge Date:  05/31/17               Expected Discharge Plan:  Home/Self Care  In-House Referral:     Discharge planning Services  CM Consult  Post Acute Care Choice:    Choice offered to:     DME Arranged:    DME Agency:     HH Arranged:    HH Agency:     Status of Service:  Completed, signed off  If discussed at [employer] Length of Stay Meetings, dates discussed:    Additional Comments:  Leone Havenaylor, Saleah Rishel Clinton, RN 05/31/2017, 9:21 AM

## 2017-05-31 NOTE — Discharge Summary (Signed)
Discharge Summary    Patient ID: DEFNE GERLING,  MRN: [id number], DOB/AGE: [birth year] 70 y.o.  Admit date: 05/26/2017 Discharge date: 05/31/2017  Primary Care Provider: Juliette Alcide Primary Cardiologist: Dr. Allyson Sabal Primary HF: Dr. Gala Romney  Discharge Diagnoses    Active Problems:   Critical lower limb ischemia  Allergies Allergies  Allergen Reactions  . Oxycodone Other (See Comments)    hallucinations   . Pollen Extract Other (See Comments)    Sneezing, running nose.  . Ranexa [Ranolazine] Nausea Only    Headache and nausea  . Statins Other (See Comments)    Cramping, turns skin yellow    Diagnostic Studies/Procedures    05/29/2017 PV Angiogram/Intervention:  Successful cutting balloon atherectomy followed by drug-eluting stenting of the P3 segment of the right below-the-knee popliteal artery reducing 99% stenosis to 0% residual.The patient will be treated with dual antiplatelet therapy in addition to Eliquis s for 1 month after which time aspirin can be discontinued.  History of Present Illness     Ms. Mcdill is a 71 y.o. female with a history of CAD s/p CABG 2003, ICM with baseline EF 25-30%, HTN, HLD, DM II, PAF on eliquis, and PAD. Her only patent graft is SVG to PLA, this was seen on recent cardiac catheterization on 04/08/2017. She has turned down ICD in the past. She also have difficult to control atrial fibrillation. She has undergone multiple cardioversions in the past.  More recently, she has been having significant pain in her right foot. This has progressed to redness. Lower extremity arterial Doppler showed monophasic waveforms in her right popliteal artery.  She was initially scheduled for lower extremity angiography on 11/29. Pre-op BMET revealed that her creatinine had jumped from 1.4, one month prior, to 2.4. The decision was made to admit the pt to the hospital for gentle hydration prior to her procedure, completed 05/30/17.    Hospital Course     Consultants: None   The patient was electively admitted to the hospital for hydration and transition off of her home Eliquis to IV Heparin in anticipation of her peripheral vascular evaluation and possible intervention. Her creatinine on admission was 2.08 and has slowly trended down to 1.24. Her spironolactone, Eliquis, and ARB were held on admission in hopes to preserve her renal function response post procedure.   Her spironolactone will not be restarted at discharge per Dr. Anne Fu.    On 05/30/2017 the pt underwent a peripheral vascular angiogram with balloon cutting and DES stent placement to the P3 segment of the right below- the-knee popliteal artery reducing from 99%>0%.   She has done well post-procedure. Her left groin site is unremarkable. No bruising or complications noted. Her fluids have been stopped, Heparin has been turned off. Due to her combine dystolic and systolic heart failure, she is being followed by the HF team. She is now back on her Eliquis, her diuretic has been restarted, as well as her ARB and BB upon discharge.   According to Dr. Hazle Coca post-procedure note, she will need to be on dual antiplatelet therapy in addition to her Eliquis for one month. After the one month time period, her ASA can be stopped.   During her admission, her AF was controlled. She remains on Amiodarone 200mg  PO QD.   Pt was last seen and examined by Dr. Anne Fu who felt that she was stable for discharge.   Post hospital follow up has been arranged with heart failure, Dr. Allyson Sabal for office visit  and doppler study. She will see Dr. Diona Browner in Jan 2019.   Her procedure was scheduled for 05/30/17. Her  Discharge Vitals Blood pressure (!) 110/50, pulse 94, temperature (!) 97.5 F (36.4 C), temperature source Oral, resp. rate 15, height 5\' 3"  (1.6 m), weight 180 lb 8.9 oz (81.9 kg), SpO2 98 %.  Filed Weights   05/29/17 0607 05/30/17 0551 05/31/17 0124  Weight: 178 lb 11.2 oz (81.1 kg) 175 lb 6.4  oz (79.6 kg) 180 lb 8.9 oz (81.9 kg)    Labs & Radiologic Studies    CBC Recent Labs    05/30/17 0223 05/31/17 0454  WBC 8.3 7.6  HGB 10.6* 10.4*  HCT 33.2* 32.7*  MCV 90.5 90.3  PLT 231 232   Basic Metabolic Panel Recent Labs    16/10/96 0223 05/31/17 0454  NA 134* 133*  K 4.3 4.0  CL 107 103  CO2 17* 19*  GLUCOSE 115* 92  BUN 31* 24*  CREATININE 1.28* 1.24*  CALCIUM 9.1 9.1   Liver Function Tests No results for input(s): AST, ALT, ALKPHOS, BILITOT, PROT, ALBUMIN in the last 72 hours. No results for input(s): LIPASE, AMYLASE in the last 72 hours. Cardiac Enzymes No results for input(s): CKTOTAL, CKMB, CKMBINDEX, TROPONINI in the last 72 hours. BNP Invalid input(s): POCBNP D-Dimer No results for input(s): DDIMER in the last 72 hours. Hemoglobin A1C No results for input(s): HGBA1C in the last 72 hours. Fasting Lipid Panel No results for input(s): CHOL, HDL, LDLCALC, TRIG, CHOLHDL, LDLDIRECT in the last 72 hours. Thyroid Function Tests No results for input(s): TSH, T4TOTAL, T3FREE, THYROIDAB in the last 72 hours.  Invalid input(s): FREET3 _____________  US Arterial Lower Extremity Duplex Bilateral  Result Date: 05/13/2017 CLINICAL DATA:  Peripheral arterial disease. Coronary artery disease, hypertension, diabetes, previous tobacco abuse. EXAM: BILATERAL LOWER EXTREMITY ARTERIAL DUPLEX SCAN TECHNIQUE: Gray-scale sonography as well as color Doppler and duplex ultrasound was performed to evaluate the arteries of both lower extremities including the common, superficial and profunda femoral arteries, popliteal artery and calf arteries. COMPARISON:  09/02/2014 by report only FINDINGS: Right Lower Extremity ABI: Not obtained Inflow: Normal common femoral arterial waveforms and velocities. No evidence of inflow (aortoiliac) disease. Outflow: Eccentric calcified plaque in the common femoral artery. Normal profunda femoral, superficial femoral arterial waveforms and velocities.  No focal elevation of the PSV to suggest stenosis. Monophasic waveform in the popliteal artery. Runoff: Monophasic waveforms. No definite occlusion on survey interrogation. No focal aliasing on color Doppler. Left Lower Extremity ABI: Not obtained Inflow: Normal common femoral arterial waveforms and velocities. No evidence of inflow (aortoiliac) disease. Outflow: Eccentric plaque through the common femoral artery. Biphasic profunda femoral, superficial femoral and popliteal arterial waveforms and velocities. Mild plaque throughout the visualized popliteal artery. No focal elevation of the PSV to suggest stenosis. Runoff: Elevated peak systolic velocities in the proximal anterior tibial artery. Monophasic waveforms in the tibial runoff. No definite distal occlusion or stenosis. IMPRESSION: 1. Abnormal monophasic waveforms in the right popliteal artery and tibial runoff without focal obstructive lesion identified. 2. Monophasic waveforms in the left tibial runoff with high-grade stenosis in the proximal anterior tibial artery. Electronically Signed   By: Corlis Leak M.D.   On: 05/13/2017 16:04   Disposition   Pt is being discharged home today in good condition.  Follow-up Plans & Appointments    Follow-up Information    [facility] Follow up on 06/09/2017.   Specialty:  Cardiology Why:  at 300 pm for post hospital follow up. The code for parking is 8002. Please bring all of your medications to your visit.  Contact information: [address] [id number] mc [place] [id number] [phone]       [facility] Follow up on 06/10/2017.   Specialty:  Cardiology Why:  For Ultrasound of your legs at Dr. Hazle CocaBerry's office at 1pm. Office visit to follow at [facility] Contact information: [address] [place] [id number] [phone]       Runell GessBerry, Jonathan J, MD Follow up on 06/10/2017.   Specialties:   Cardiology, Radiology Why:  Hospital follow up appointment with Dr. Conception OmsBerry Contact information: [address] [place] [id number] [phone]        Jonelle SidleMcDowell, Samuel G, MD Follow up on 07/12/2017.   Specialty:  Cardiology Why:  6 week follow up appointment with Dr. Diona BrownerMcDowell at 9:40am. Please arrive by 9:30am Contact information: [address] [place] [phone]           Discharge Medications   Allergies as of 05/31/2017      Reactions   Oxycodone Other (See Comments)   hallucinations    Pollen Extract Other (See Comments)   Sneezing, running nose.   Ranexa [ranolazine] Nausea Only   Headache and nausea   Statins Other (See Comments)   Cramping, turns skin yellow      Medication List    STOP taking these medications   enoxaparin 80 MG/0.8ML injection Commonly known as:  LOVENOX   ibuprofen 200 MG tablet Commonly known as:  ADVIL,MOTRIN   omeprazole 40 MG capsule Commonly known as:  PRILOSEC   spironolactone 25 MG tablet Commonly known as:  ALDACTONE   sulfamethoxazole-trimethoprim 800-160 MG tablet Commonly known as:  BACTRIM DS,SEPTRA DS     TAKE these medications   acetaminophen 500 MG tablet Commonly known as:  TYLENOL Take 1,000 mg by mouth daily as needed for moderate pain.   albuterol 108 (90 Base) MCG/ACT inhaler Commonly known as:  PROVENTIL HFA;VENTOLIN HFA Inhale 2 puffs into the lungs every 6 (six) hours as needed for wheezing or shortness of breath.   amiodarone 200 MG tablet Commonly known as:  PACERONE Take 1 tablet (200 mg total) daily by mouth.   apixaban 5 MG Tabs tablet Commonly known as:  ELIQUIS Take 1 tablet (5 mg total) by mouth 2 (two) times daily.   aspirin 81 MG EC tablet Take 1 tablet (81 mg total) by mouth daily. Start taking on:  06/01/2017   carvedilol 3.125 MG tablet Commonly known as:  COREG Take 3.125 mg by mouth 2 (two) times daily.   clopidogrel 75 MG tablet Commonly  known as:  PLAVIX Take 1 tablet (75 mg total) by mouth daily.   diphenhydramine-acetaminophen 25-500 MG Tabs tablet Commonly known as:  TYLENOL PM Take 2 tablets by mouth at bedtime as needed (sleep).   levothyroxine 175 MCG tablet Commonly known as:  SYNTHROID, LEVOTHROID Take 175 mcg by mouth daily before breakfast.   losartan 25 MG tablet Commonly known as:  COZAAR Take 0.5 tablets (12.5 mg total) by mouth daily.   Magnesium 250 MG Tabs Take 250 mg by mouth daily.   neomycin-bacitracin-polymyxin ointment Commonly known as:  NEOSPORIN Apply 1 application as needed topically for wound care. apply to toe   nitroGLYCERIN 0.2 mg/hr patch Commonly known as:  NITRODUR - Dosed in mg/24 hr PLACE 1 PATCH ONTO THE SKIN DAILY AS  NEEDED FOR CHEST PAINS   oxymetazoline 0.05 % nasal spray Commonly known as:  AFRIN Place 1 spray into both nostrils daily as needed.   pantoprazole 40 MG tablet Commonly known as:  PROTONIX Take 1 tablet (40 mg total) by mouth daily.   potassium chloride 10 MEQ tablet Commonly known as:  K-DUR,KLOR-CON Take 2 tablets (20 mEq total) by mouth 2 (two) times daily.   torsemide 20 MG tablet Commonly known as:  DEMADEX Take 2 tablets (40 mg total) by mouth 2 (two) times daily.         Outstanding Labs/Studies   None  Duration of Discharge Encounter   Greater than 30 minutes including physician time.  Signed, Georgie ChardJill McDaniel NP 05/31/2017, 12:40 PM    Personally seen and examined. Agree with above. 71 year old with Cutting Balloon DES placement of the P3 segment right below knee popliteal artery, dual antiplatelet therapy for at least one month and aspirin can be discontinued, remain on Eliquis for persistent atrial fibrillation, unsuccessful cardioversion.  Still on p.o. amiodarone 200 mg once a day.  Unless there are further plans for potential cardioversion in the future, one could consider discontinuation of antiarrhythmic.  Creatinine stable  1.24. CAD is stable, post CABG remotely. Exam: Alert, regular rate and rhythm, lungs are clear, cath site clean dry and intact. Hold spironolactone, okay to resume Lasix.  She has appointment in advanced heart failure clinic in 1 week.  Okay for discharge. Donato SchultzMark Allyiah Gartner, MD

## 2017-06-01 ENCOUNTER — Encounter (HOSPITAL_COMMUNITY): Payer: Medicare Other | Admitting: Internal Medicine

## 2017-06-01 ENCOUNTER — Telehealth: Payer: Self-pay | Admitting: Cardiovascular Disease

## 2017-06-01 ENCOUNTER — Encounter: Payer: Self-pay | Admitting: Cardiovascular Disease

## 2017-06-01 NOTE — Telephone Encounter (Signed)
Yes

## 2017-06-01 NOTE — Telephone Encounter (Signed)
Ok to add her in next Tuesday, 12/11?

## 2017-06-01 NOTE — Telephone Encounter (Signed)
New message  Patient calling to report foot pain.   Pt c/o swelling: STAT is pt has developed SOB within 24 hours  1) How much weight have you gained and in what time span? n/a  2) If swelling, where is the swelling located?  Right foot is worse, both feet weeping   3) Are you currently taking a fluid pill? YES  4) Are you currently SOB? NO  5) Do you have a log of your daily weights (if so, list)? 177, 175, 171  6) Have you gained 3 pounds in a day or 5 pounds in a week?   7) Have you traveled recently? NO

## 2017-06-01 NOTE — Telephone Encounter (Signed)
Pt of Dr. Allyson SabalBerry s/p PV cath w stents 12/3 Pt called asking for more information regarding what is expected, as far as her timeline of recovery after intervention.  Pt notes pink color in the foot which is new (pale white prior to surgery) Notes swelling in her feet is about the same [facility]. No changes in pain in LE's (no better or worse). No changes in ambulation (still endorses difficulty walking)   She wants to know what is expected to be "normal" for her, esp in regards to her feet.   She does have follow up scheduled 12/14 w Dr. Allyson Sabalberry but states "that's a long way off" and she's wondering if she can be seen sooner. I checked & did not see availability this week - routed to MD for options.

## 2017-06-02 ENCOUNTER — Telehealth: Payer: Self-pay | Admitting: Cardiology

## 2017-06-02 NOTE — Telephone Encounter (Signed)
Lmtcb to schedule appt. [facility] s/p angio--atherectomy LEA.

## 2017-06-02 NOTE — Telephone Encounter (Signed)
Patient describes pain in foot as tingling and is going to try warm soaks and elevate leg above her heart. She called Dr Hazle CocaBerry's office and was told he does not give out pain medications.She said she was told to expect pain for up to 2 weeks.She is going to call me back if she continues to have pain

## 2017-06-02 NOTE — Telephone Encounter (Signed)
Pt husband returned call. Pt now scheduled for 12/11 f/u appt with Dr. Allyson SabalBerry at 2pm and LEA duplex rescheduled to 12 pm on same day.

## 2017-06-02 NOTE — Telephone Encounter (Signed)
Per phone call from pt--having extreme pain from the procedure she had w/ Dr. Allyson SabalBerry --would like to know if Dr. Diona BrownerMcDowell would send in some pain medication for her.

## 2017-06-04 ENCOUNTER — Other Ambulatory Visit: Payer: Self-pay

## 2017-06-04 ENCOUNTER — Encounter (HOSPITAL_COMMUNITY): Payer: Self-pay | Admitting: Emergency Medicine

## 2017-06-04 ENCOUNTER — Emergency Department (HOSPITAL_COMMUNITY)
Admission: EM | Admit: 2017-06-04 | Discharge: 2017-06-04 | Disposition: A | Discharge status: Home / Self Care | Payer: Medicare Other | Attending: Emergency Medicine | Admitting: Emergency Medicine

## 2017-06-04 DIAGNOSIS — I2510 Atherosclerotic heart disease of native coronary artery without angina pectoris: Secondary | ICD-10-CM | POA: Diagnosis not present

## 2017-06-04 DIAGNOSIS — I110 Hypertensive heart disease with heart failure: Secondary | ICD-10-CM | POA: Insufficient documentation

## 2017-06-04 DIAGNOSIS — I5042 Chronic combined systolic (congestive) and diastolic (congestive) heart failure: Secondary | ICD-10-CM | POA: Insufficient documentation

## 2017-06-04 DIAGNOSIS — Z951 Presence of aortocoronary bypass graft: Secondary | ICD-10-CM | POA: Insufficient documentation

## 2017-06-04 DIAGNOSIS — E039 Hypothyroidism, unspecified: Secondary | ICD-10-CM | POA: Diagnosis not present

## 2017-06-04 DIAGNOSIS — E119 Type 2 diabetes mellitus without complications: Secondary | ICD-10-CM | POA: Insufficient documentation

## 2017-06-04 DIAGNOSIS — Z79899 Other long term (current) drug therapy: Secondary | ICD-10-CM | POA: Insufficient documentation

## 2017-06-04 DIAGNOSIS — J45909 Unspecified asthma, uncomplicated: Secondary | ICD-10-CM | POA: Insufficient documentation

## 2017-06-04 DIAGNOSIS — Z87891 Personal history of nicotine dependence: Secondary | ICD-10-CM | POA: Insufficient documentation

## 2017-06-04 DIAGNOSIS — M79671 Pain in right foot: Secondary | ICD-10-CM

## 2017-06-04 DIAGNOSIS — Z7982 Long term (current) use of aspirin: Secondary | ICD-10-CM | POA: Insufficient documentation

## 2017-06-04 DIAGNOSIS — Z7901 Long term (current) use of anticoagulants: Secondary | ICD-10-CM | POA: Diagnosis not present

## 2017-06-04 LAB — BASIC METABOLIC PANEL
Anion gap: 12 (ref 5–15)
BUN: 29 mg/dL — AB (ref 6–20)
CHLORIDE: 102 mmol/L (ref 101–111)
CO2: 24 mmol/L (ref 22–32)
CREATININE: 1.36 mg/dL — AB (ref 0.44–1.00)
Calcium: 9.5 mg/dL (ref 8.9–10.3)
GFR calc Af Amer: 45 mL/min — ABNORMAL LOW (ref >60)
GFR calc non Af Amer: 38 mL/min — ABNORMAL LOW (ref >60)
GLUCOSE: 139 mg/dL — AB (ref 65–99)
POTASSIUM: 4 mmol/L (ref 3.5–5.1)
SODIUM: 138 mmol/L (ref 135–145)

## 2017-06-04 LAB — CBC WITH DIFFERENTIAL/PLATELET
Basophils Absolute: 0 10*3/uL (ref 0.0–0.1)
Basophils Relative: 0 %
EOS ABS: 0.1 10*3/uL (ref 0.0–0.7)
EOS PCT: 1 %
HCT: 36.7 % (ref 36.0–46.0)
Hemoglobin: 11.7 g/dL — ABNORMAL LOW (ref 12.0–15.0)
LYMPHS ABS: 1.4 10*3/uL (ref 0.7–4.0)
LYMPHS PCT: 14 %
MCH: 29.4 pg (ref 26.0–34.0)
MCHC: 31.9 g/dL (ref 30.0–36.0)
MCV: 92.2 fL (ref 78.0–100.0)
MONO ABS: 0.9 10*3/uL (ref 0.1–1.0)
MONOS PCT: 8 %
Neutro Abs: 7.8 10*3/uL — ABNORMAL HIGH (ref 1.7–7.7)
Neutrophils Relative %: 77 %
PLATELETS: 355 10*3/uL (ref 150–400)
RBC: 3.98 MIL/uL (ref 3.87–5.11)
RDW: 18.5 % — AB (ref 11.5–15.5)
WBC: 10.2 10*3/uL (ref 4.0–10.5)

## 2017-06-04 LAB — I-STAT CG4 LACTIC ACID, ED: LACTIC ACID, VENOUS: 1.86 mmol/L (ref 0.5–1.9)

## 2017-06-04 MED ORDER — HYDROCODONE-ACETAMINOPHEN 5-325 MG PO TABS: 1.0000 | ORAL_TABLET | ORAL | 0 refills | Status: DC | PRN

## 2017-06-04 MED ORDER — ONDANSETRON HCL 4 MG/2ML IJ SOLN
4.0000 mg | Freq: Once | INTRAMUSCULAR | Status: AC
  Administered 2017-06-04: 4 mg via INTRAVENOUS
  Filled 2017-06-04: qty 2

## 2017-06-04 MED ORDER — FENTANYL CITRATE (PF) 100 MCG/2ML IJ SOLN
100.0000 ug | INTRAMUSCULAR | Status: DC | PRN
  Administered 2017-06-04: 50 ug via INTRAVENOUS
  Filled 2017-06-04: qty 2

## 2017-06-04 MED ORDER — SODIUM CHLORIDE 0.9 % IV SOLN: INTRAVENOUS | Status: DC

## 2017-06-04 NOTE — ED Triage Notes (Signed)
[facility]  Pt c/o of weeping between toes, toes turning black and pain since yesterday.

## 2017-06-04 NOTE — Discharge Instructions (Signed)
The testing today is reassuring.  It is safe to soak your foot in warm water, less than 100 degrees, 3 times a day for 10 or 15 minutes.  Try to clean the areas between the toes well with soap and water daily.  Keep the right foot in a position of comfort, elevated or down, if needed.

## 2017-06-04 NOTE — ED Provider Notes (Signed)
[facility] EMERGENCY DEPARTMENT Provider Note   CSN: [id number] Arrival date & time: 06/04/17  1537     History   Chief Complaint Chief Complaint  Patient presents with  . Toe Pain    HPI [name] is a 71 y.o. female.  She presents for evaluation of ongoing redness left foot, which she feels worsened after her 5 days ago, but has since then improved somewhat.  Initially the pain was constant, but now she is having intermittent periods of sharp pain, which makes it difficult to walk, as well as having pain at rest.  She denies fever, chills, shortness of breath, chest pain, weakness or dizziness.  She is not taking any narcotic pain medicine and is not getting relief from treatments at home with over-the-counter medications.  No recent trauma.  She is concerned about ongoing "weepy drainage," from the toes.  There is also an area of the right great toe, which family members are concerned indicates poor blood flow.  The patient wonders, if she is at risk for losing her foot.  She contacted her vascular doctor who suggested follow-up in the office, next week, in 3 days.  Patient does not feel that she can wait until then.  There are no other known  HPI  Past Medical History:  Diagnosis Date  . Asthma   . CAD (coronary artery disease)    Multivessel status post CABG in 2003  . Chronic combined systolic (congestive) and diastolic (congestive) heart failure (HCC) 12/25/2016  . Chronic systolic heart failure (HCC)   . Essential hypertension   . GERD (gastroesophageal reflux disease)   . Gum disease   . History of atrial flutter    Cardioversion 2016 in [place]  . History of colonic polyps   . History of goiter   . Hyperlipidemia   . Hypothyroidism   . Ischemic cardiomyopathy 12/25/2016  . Myocardial infarction (HCC)    2003  . Peripheral arterial disease (HCC)    Stent revascularization 2015 - details not clear  . Renal insufficiency   . Type 2 diabetes mellitus [facility])      Patient Active Problem List   Diagnosis Date Noted  . Critical lower limb ischemia 05/24/2017  . PAF (paroxysmal atrial fibrillation) (HCC)   . Abnormal stress test   . Chest pain 04/07/2017  . Elevated troponin 12/25/2016  . Ischemic cardiomyopathy 12/25/2016  . Chronic combined systolic (congestive) and diastolic (congestive) heart failure (HCC) 12/25/2016  . Atrial flutter by electrocardiogram (HCC)   . Acute on chronic systolic heart failure (HCC) 12/24/2016  . Paroxysmal atrial flutter (HCC) 12/24/2016  . Coronary artery disease 12/24/2016  . Hypothyroidism 12/24/2016  . GERD (gastroesophageal reflux disease) 12/24/2016  . Type 2 diabetes mellitus with other specified complication (HCC) 12/24/2016    Past Surgical History:  Procedure Laterality Date  . ABDOMINAL AORTOGRAM W/LOWER EXTREMITY Right 05/30/2017   Procedure: ABDOMINAL AORTOGRAM W/LOWER EXTREMITY;  Surgeon: Runell Gess, MD;  Location[facility] INVASIVE CV LAB;  Service: Cardiovascular;  Laterality: Right;  . CARDIAC CATHETERIZATION    . CARDIOVERSION N/A 04/12/2017   Procedure: CARDIOVERSION;  Surgeon: Dolores Patty, MD;  Location: [facility] ENDOSCOPY;  Service: Cardiovascular;  Laterality: N/A;  . CARDIOVERSION N/A 05/02/2017   Procedure: CARDIOVERSION;  Surgeon: Dolores Patty, MD;  Location: [facility] ENDOSCOPY;  Service: Cardiovascular;  Laterality: N/A;  . CHOLECYSTECTOMY     2012  . CORONARY ARTERY BYPASS GRAFT     2003  . PARATHYROIDECTOMY  2015  . PERIPHERAL VASCULAR INTERVENTION Right 05/30/2017  . PERIPHERAL VASCULAR INTERVENTION Right 05/30/2017   Procedure: PERIPHERAL VASCULAR INTERVENTION;  Surgeon: Runell GessBerry, Jonathan J, MD;  [facility];  Service: Cardiovascular;  Laterality: Right;  . RIGHT/LEFT HEART CATH AND CORONARY/GRAFT ANGIOGRAPHY N/A 04/08/2017   Procedure: RIGHT/LEFT HEART CATH AND CORONARY/GRAFT ANGIOGRAPHY;  Surgeon: Corky CraftsVaranasi, Jayadeep S, MD;  [facility];   Service: Cardiovascular;  Laterality: N/A;  . TEE WITHOUT CARDIOVERSION N/A 04/12/2017   Procedure: TRANSESOPHAGEAL ECHOCARDIOGRAM (TEE);  Surgeon: Dolores PattyBensimhon, Daniel R, MD;  [facility];  Service: Cardiovascular;  Laterality: N/A;  . THYROIDECTOMY    . TONSILLECTOMY     1967  . ULTRASOUND GUIDANCE FOR VASCULAR ACCESS  04/08/2017   Procedure: Ultrasound Guidance For Vascular Access;  Surgeon: Corky CraftsVaranasi, Jayadeep S, MD;  [facility];  Service: Cardiovascular;;    OB History    No data available       Home Medications    Prior to Admission medications   Medication Sig Start Date End Date Taking? Authorizing Provider  acetaminophen (TYLENOL) 500 MG tablet Take 1,000 mg by mouth daily as needed for moderate pain.     [provider]  albuterol (PROVENTIL HFA;VENTOLIN HFA) 108 (90 Base) MCG/ACT inhaler Inhale 2 puffs into the lungs every 6 (six) hours as needed for wheezing or shortness of breath.     [provider]  amiodarone (PACERONE) 200 MG tablet Take 1 tablet (200 mg total) daily by mouth. 05/11/17   Jonelle SidleMcDowell, Samuel G, MD  apixaban (ELIQUIS) 5 MG TABS tablet Take 1 tablet (5 mg total) by mouth 2 (two) times daily. 10/08/16 06/25/17  Jonelle SidleMcDowell, Samuel G, MD  aspirin 81 MG EC tablet Take 1 tablet (81 mg total) by mouth daily. 06/01/17   Georgie ChardMcDaniel, Jill D, NP  carvedilol (COREG) 3.125 MG tablet Take 3.125 mg by mouth 2 (two) times daily.    [provider]  clopidogrel (PLAVIX) 75 MG tablet Take 1 tablet (75 mg total) by mouth daily. 05/31/17   Filbert SchilderMcDaniel, Jill D, NP  diphenhydramine-acetaminophen (TYLENOL PM) 25-500 MG TABS tablet Take 2 tablets by mouth at bedtime as needed (sleep).    [provider]  HYDROcodone-acetaminophen (NORCO) 5-325 MG tablet Take 1 tablet by mouth every 4 (four) hours as needed. 06/04/17   Mancel BaleWentz, Danamarie Minami, MD  levothyroxine (SYNTHROID, LEVOTHROID) 175 MCG tablet Take 175 mcg by mouth daily before  breakfast.    [provider]  losartan (COZAAR) 25 MG tablet Take 0.5 tablets (12.5 mg total) by mouth daily. 04/13/17   Clegg, Amy D, NP  Magnesium 250 MG TABS Take 250 mg by mouth daily.     [provider]  neomycin-bacitracin-polymyxin (NEOSPORIN) ointment Apply 1 application as needed topically for wound care. apply to toe    [provider]  nitroGLYCERIN (NITRODUR - DOSED IN MG/24 HR) 0.2 mg/hr patch PLACE 1 PATCH ONTO THE SKIN DAILY AS NEEDED FOR CHEST PAINS 12/23/16   Jonelle SidleMcDowell, Samuel G, MD  oxymetazoline (AFRIN) 0.05 % nasal spray Place 1 spray into both nostrils daily as needed.     [provider]  pantoprazole (PROTONIX) 40 MG tablet Take 1 tablet (40 mg total) by mouth daily. 05/31/17   Georgie ChardMcDaniel, Jill D, NP  potassium chloride (K-DUR,KLOR-CON) 10 MEQ tablet Take 2 tablets (20 mEq total) by mouth 2 (two) times daily. 04/13/17   Clegg, Amy D, NP  torsemide (DEMADEX) 20 MG tablet Take 2  tablets (40 mg total) by mouth 2 (two) times daily. 04/13/17   Sherald Hesslegg, Amy D, NP    Family History Family History  Problem Relation Age of Onset  . Stroke Mother   . Heart attack Father   . Breast cancer Maternal Aunt   . Breast cancer Paternal Aunt     Social History Social History   Tobacco Use  . Smoking status: Former Smoker    Packs/day: 1.00    Years: 21.00    Pack years: 21.00    Types: Cigarettes    Last attempt to quit: 1988    Years since quitting: 30.9  . Smokeless tobacco: Never Used  Substance Use Topics  . Alcohol use: No  . Drug use: No     Allergies   Oxycodone; Pollen extract; Ranexa [ranolazine]; and Statins   Review of Systems Review of Systems  All other systems reviewed and are negative.    Physical Exam Updated Vital Signs BP 109/65   Pulse 96   Temp 98.3 F (36.8 C) (Oral)   Resp 18   Ht 5\' 3"  (1.6 m)   Wt 76.2 kg (168 lb)   SpO2 99%   BMI 29.76 kg/m   Physical Exam  Constitutional: She is oriented to  person, place, and time. She appears well-developed and well-nourished.  HENT:  Head: Normocephalic and atraumatic.  Eyes: Conjunctivae and EOM are normal. Pupils are equal, round, and reactive to light.  Neck: Normal range of motion and phonation normal. Neck supple.  Cardiovascular: Normal rate.  Pulmonary/Chest: Effort normal.  Musculoskeletal: Normal range of motion.  Right forefoot swollen, mid to distal, including toes, with mild erythema.  Dried drainage in the third and fourth webspaces.  Patient resists mobilization of the toes secondary to pain, and is noted to have good range of motion of the right ankle knee.  There is no proximal streaking.  No gross drainage or bleeding.  Toes the right foot are sensate.  Neurological: She is alert and oriented to person, place, and time. She exhibits normal muscle tone.  Skin: Skin is warm and dry.  Psychiatric: She has a normal mood and affect. Her behavior is normal. Judgment and thought content normal.  Nursing note and vitals reviewed.    ED Treatments / Results  Labs (all labs ordered are listed, but only abnormal results are displayed) Labs Reviewed  BASIC METABOLIC PANEL - Abnormal; Notable for the following components:      Result Value   Glucose, Bld 139 (*)    BUN 29 (*)    Creatinine, Ser 1.36 (*)    GFR calc non Af Amer 38 (*)    GFR calc Af Amer 45 (*)    All other components within normal limits  CBC WITH DIFFERENTIAL/PLATELET - Abnormal; Notable for the following components:   Hemoglobin 11.7 (*)    RDW 18.5 (*)    Neutro Abs 7.8 (*)    All other components within normal limits  CULTURE, BLOOD (ROUTINE X 2)  CULTURE, BLOOD (ROUTINE X 2)  I-STAT CG4 LACTIC ACID, ED  I-STAT CG4 LACTIC ACID, ED   BUN  Date Value Ref Range Status  06/04/2017 29 (H) 6 - 20 mg/dL Final  95/28/413212/09/2016 24 (H) 6 - 20 mg/dL Final  44/01/027212/08/2016 31 (H) 6 - 20 mg/dL Final  53/66/440312/07/2016 27 (H) 6 - 20 mg/dL Final  47/42/595611/27/2018 55 (H) 8 - 27 mg/dL  Final   Creatinine, Ser  Date Value Ref Range Status  06/04/2017 1.36 (H) 0.44 - 1.00 mg/dL Final  96/09/5407 8.11 (H) 0.44 - 1.00 mg/dL Final  91/47/8295 6.21 (H) 0.44 - 1.00 mg/dL Final  30/86/5784 6.96 (H) 0.44 - 1.00 mg/dL Final     EKG  EKG Interpretation None       Radiology No results found.  Procedures Procedures (including critical care time)  Medications Ordered in ED Medications  fentaNYL (SUBLIMAZE) injection 100 mcg (50 mcg Intravenous Given 06/04/17 1636)  0.9 %  sodium chloride infusion (not administered)  ondansetron (ZOFRAN) injection 4 mg (4 mg Intravenous Given 06/04/17 1636)     Initial Impression / Assessment and Plan / ED Course  I have reviewed the triage vital signs and the nursing notes.  Pertinent labs & imaging results that were available during my care of the patient were reviewed by me and considered in my medical decision making (see chart for details).  Clinical Course as of Jun 04 1746  Sat Jun 04, 2017  1650 Doppler interrogation right foot, 4 pulse by me, indicates a monophasic to biphasic audible pulse, dorsalis pedis.  Patient is more comfortable at this time after treatment with narcotic pain medication.  [EW]  1726 Mild elevation Glucose: (!) 139 [EW]  1726 Mild elevation BUN: (!) 29 [EW]  1726 Mild elevation Creatinine: (!) 1.36 [EW]  1726 Normal WBC: 10.2 [EW]  1726 Slightly low Hemoglobin: (!) 11.7 [EW]  1726 Normal Lactic Acid, Venous: 1.86 [EW]    Clinical Course User Index [EW] Mancel Bale, MD     Patient Vitals for the past 24 hrs:  BP Temp Temp src Pulse Resp SpO2 Height Weight  06/04/17 1730 109/65 - - 96 - 99 % - -  06/04/17 1703 100/68 - - 97 - 97 % - -  06/04/17 1700 93/71 - - 97 - 99 % - -  06/04/17 1635 (!) 107/46 - - 100 - 100 % - -  06/04/17 1549 131/70 98.3 F (36.8 C) Oral 100 18 97 % 5\' 3"  (1.6 m) 76.2 kg (168 lb)    5:42 PM Reevaluation with update and discussion. After initial assessment and  treatment, an updated evaluation reveals patient is more comfortable at this time.  Findings discussed with patient and husband, all questions were answered. Mancel Bale      Final Clinical Impressions(s) / ED Diagnoses   Final diagnoses:  Right foot pain    Nonspecific foot pain, with reassuring findings of good blood flow to the foot, and no gross signs for infection.  No apparent complications from recent right leg angioplasty and stenting.  No evidence for infection, foot ischemia, toe ischemia or a asending infection.  Nursing Notes Reviewed/ Care Coordinated Applicable Imaging Reviewed Interpretation of Laboratory Data incorporated into ED treatment  The patient appears reasonably screened and/or stabilized for discharge and I doubt any other medical condition or other [facility] requiring further screening, evaluation, or treatment in the ED at this time prior to discharge.  Plan: Home Medications-continue current medications; Home Treatments-soak right foot 2 or 3 times a day for 15 minutes in warm water, then cleaning well with soap; return here if the recommended treatment, does not improve the symptoms; Recommended follow up-vascular follow-up in 3 days as scheduled.  Return here if needed   ED Discharge Orders        Ordered    HYDROcodone-acetaminophen (NORCO) 5-325 MG tablet  Every 4 hours PRN     06/04/17 1741       Mancel Bale,  MD 06/04/17 1747

## 2017-06-07 ENCOUNTER — Encounter (HOSPITAL_COMMUNITY): Payer: Medicare Other

## 2017-06-07 ENCOUNTER — Ambulatory Visit: Payer: Medicare Other | Admitting: Cardiovascular Disease

## 2017-06-08 ENCOUNTER — Other Ambulatory Visit: Payer: Self-pay | Admitting: Cardiovascular Disease

## 2017-06-08 ENCOUNTER — Telehealth: Payer: Self-pay | Admitting: Cardiovascular Disease

## 2017-06-08 ENCOUNTER — Telehealth: Payer: Self-pay

## 2017-06-08 ENCOUNTER — Telehealth: Payer: Self-pay | Admitting: Cardiology

## 2017-06-08 DIAGNOSIS — I739 Peripheral vascular disease, unspecified: Secondary | ICD-10-CM

## 2017-06-08 NOTE — Telephone Encounter (Signed)
Please give pt a call  °

## 2017-06-08 NOTE — Telephone Encounter (Signed)
Returned call to pt/spouse they state that pt is in pain all the time and needs pain medication. She states that she was at the ER ap  [place] 06-04-[place] and states that they told her that there was nothing wrong and would not give her pain medication. Informed pt/husband to go to the ER-verbalizes understanding but states that they cannot get out due to the weather and would like message to be sent lto Dr Allyson SabalBerry for review for a pain medication rx.

## 2017-06-08 NOTE — Telephone Encounter (Signed)
Returned pt call. She is upset that she [place]. She asked if Dr. Diona BrownerMcDowell could prescribe her pain medication to make it to that visit and she cannot make it back to the Emergency Dept. I apologized to the patient and advised her that Dr. Diona BrownerMcDowell does not prescribe pain medication, but to try to contact the doctor that did. She was not happy when we got off of the phone, but I did advise her to contact the doctor that she will see Friday.

## 2017-06-08 NOTE — Telephone Encounter (Signed)
Please call,pt have been in a lot of pain since angioplasty-she needs something for this pain.

## 2017-06-08 NOTE — Telephone Encounter (Signed)
Returned call to pt/spouse they state that pt is in pain all the time and needs pain medication. She states that she was at the ER ap [place] 06-04-18 and states that they told her that there was nothing wrong and would not give her pain medication. Informed pt/husband to go to the ER-verbalizes understanding but states that they cannot get out due to the weather and would like message to be sent lto Dr Allyson SabalBerry for review for a pain medication rx.See other Tele note

## 2017-06-09 ENCOUNTER — Inpatient Hospital Stay (HOSPITAL_COMMUNITY): Payer: Medicare Other

## 2017-06-09 LAB — CULTURE, BLOOD (ROUTINE X 2)
CULTURE: NO GROWTH
CULTURE: NO GROWTH
SPECIAL REQUESTS: ADEQUATE
Special Requests: ADEQUATE

## 2017-06-09 NOTE — Telephone Encounter (Signed)
Pain meds need to come from PCP

## 2017-06-09 NOTE — Telephone Encounter (Signed)
Pt is coming in for her follow-up appt tomorrow.

## 2017-06-10 ENCOUNTER — Ambulatory Visit (HOSPITAL_COMMUNITY)
Admission: RE | Admit: 2017-06-10 | Discharge: 2017-06-10 | Disposition: A | Discharge status: Home / Self Care | Payer: Medicare Other | Source: Ambulatory Visit | Attending: Cardiovascular Disease | Admitting: Cardiovascular Disease

## 2017-06-10 ENCOUNTER — Ambulatory Visit: Payer: Medicare Other | Admitting: Cardiovascular Disease

## 2017-06-10 ENCOUNTER — Encounter: Payer: Self-pay | Admitting: Cardiovascular Disease

## 2017-06-10 ENCOUNTER — Encounter (HOSPITAL_COMMUNITY): Payer: Medicare Other

## 2017-06-10 VITALS — BP 114/71 | HR 94 | Ht 63.0 in | Wt 174.0 lb

## 2017-06-10 DIAGNOSIS — I2510 Atherosclerotic heart disease of native coronary artery without angina pectoris: Secondary | ICD-10-CM | POA: Insufficient documentation

## 2017-06-10 DIAGNOSIS — R9389 Abnormal findings on diagnostic imaging of other specified body structures: Secondary | ICD-10-CM | POA: Diagnosis not present

## 2017-06-10 DIAGNOSIS — I70229 Atherosclerosis of native arteries of extremities with rest pain, unspecified extremity: Secondary | ICD-10-CM

## 2017-06-10 DIAGNOSIS — I998 Other disorder of circulatory system: Secondary | ICD-10-CM

## 2017-06-10 DIAGNOSIS — E1151 Type 2 diabetes mellitus with diabetic peripheral angiopathy without gangrene: Secondary | ICD-10-CM | POA: Diagnosis not present

## 2017-06-10 DIAGNOSIS — Z87891 Personal history of nicotine dependence: Secondary | ICD-10-CM | POA: Insufficient documentation

## 2017-06-10 DIAGNOSIS — I739 Peripheral vascular disease, unspecified: Secondary | ICD-10-CM

## 2017-06-10 MED ORDER — CEPHALEXIN 500 MG PO CAPS: 500.0000 mg | ORAL_CAPSULE | Freq: Two times a day (BID) | ORAL | 0 refills | Status: DC

## 2017-06-10 NOTE — Assessment & Plan Note (Signed)
Ms. [name] returns today after her recent endovascular procedure which I performed on 05/26/17 for critical limb ischemia. She is a patient of Dr. Bensimhon's and Dr. Ival [name]'s as well. 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 I also performed in the posterior of a long segmental diffuse disease peroneal artery. Initially her foot was red and painful periods to the aforementioned areas of gangrene probable superimposed infection although her foot is warm, her pulses are palpable and her Dopplers have normalized. I'm going to refer her to the care center for further further debridement and local wound care. I'm also going to begin her on Keflex 500 mg by mouth twice a day for 10 days. I will see her back in 2 months for follow-up.

## 2017-06-10 NOTE — Patient Instructions (Signed)
Medication Instructions: Your physician recommends that you continue on your current medications as directed. Please refer to the Current Medication list given to you today.  START Keflex 500 mg twice daily for 10 days.   Follow-Up: You have been referred to the Wound Care Center.  Your physician recommends that you schedule a follow-up appointment in: 2 months with Dr. Allyson SabalBerry.

## 2017-06-10 NOTE — Progress Notes (Signed)
Ms. [name] returns today after her recent endovascular procedure which I performed on 05/26/17 for critical limb ischemia. She is a patient of Dr. Bensimhon's and Dr. Ival BibleMcDowell's as well. 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 I also performed in the posterior of a long segmental diffuse disease peroneal artery. Initially her foot was red and painful periods to the aforementioned areas of gangrene probable superimposed infection although her foot is warm, her pulses are palpable and her Dopplers have normalized. I'm going to refer her to the care center for further further debridement and local wound care. I'm also going to begin her on Keflex 500 mg by mouth twice a day for 10 days. I will see her back in 2 months for follow-up.  [name] GessJonathan J. Nilza [name], M.D., FACP, [facility] HospitalFACC, Earl LagosFAHA, [facility]SC[facility] [address] [place], [place]  [id number]  [phone] 06/10/2017 2:38 PM

## 2017-06-13 ENCOUNTER — Encounter: Payer: Medicare Other | Attending: Surgery | Admitting: Surgery

## 2017-06-13 DIAGNOSIS — E1152 Type 2 diabetes mellitus with diabetic peripheral angiopathy with gangrene: Secondary | ICD-10-CM | POA: Insufficient documentation

## 2017-06-13 DIAGNOSIS — J449 Chronic obstructive pulmonary disease, unspecified: Secondary | ICD-10-CM | POA: Insufficient documentation

## 2017-06-13 DIAGNOSIS — I509 Heart failure, unspecified: Secondary | ICD-10-CM | POA: Insufficient documentation

## 2017-06-13 DIAGNOSIS — I70661 Atherosclerosis of nonbiological bypass graft(s) of the extremities with gangrene, right leg: Secondary | ICD-10-CM | POA: Insufficient documentation

## 2017-06-13 DIAGNOSIS — I70235 Atherosclerosis of native arteries of right leg with ulceration of other part of foot: Secondary | ICD-10-CM | POA: Insufficient documentation

## 2017-06-13 DIAGNOSIS — I110 Hypertensive heart disease with heart failure: Secondary | ICD-10-CM | POA: Diagnosis not present

## 2017-06-13 DIAGNOSIS — E785 Hyperlipidemia, unspecified: Secondary | ICD-10-CM | POA: Insufficient documentation

## 2017-06-13 DIAGNOSIS — I2510 Atherosclerotic heart disease of native coronary artery without angina pectoris: Secondary | ICD-10-CM | POA: Diagnosis not present

## 2017-06-13 DIAGNOSIS — I255 Ischemic cardiomyopathy: Secondary | ICD-10-CM | POA: Diagnosis not present

## 2017-06-13 DIAGNOSIS — Z87891 Personal history of nicotine dependence: Secondary | ICD-10-CM | POA: Insufficient documentation

## 2017-06-13 DIAGNOSIS — L97512 Non-pressure chronic ulcer of other part of right foot with fat layer exposed: Secondary | ICD-10-CM | POA: Insufficient documentation

## 2017-06-13 NOTE — Telephone Encounter (Signed)
Pt was here for appt 06-10-17

## 2017-06-13 NOTE — Progress Notes (Addendum)
[name], [name] ([id number]) Visit Report for 06/13/2017 Chief Complaint Document Details Patient Name: [name], [name] Date of Service: 06/13/2017 1:15 PM Medical Record Number: [id number] Patient Account Number: [id number] Date of Birth/Sex: [birth year] (71 y.o. Female) Treating RN: Huel CoventryWoody, Kim Primary Care Provider: Quintin AltoBURDINE, [name] Other Clinician: Referring Provider: Nanetta BattyBERRY, [name] Treating Provider/Extender: Rudene ReBritto, Fayez Sturgell Weeks in Treatment: 0 Information Obtained from: Patient Chief Complaint Patient presents to the wound care center today with an open arterial ulcer to the interdigital areas of her right foot which she's had for about 2 weeks Electronic Signature(s) Signed: 06/13/2017 2:49:40 PM By: Evlyn KannerBritto, Shawntrice Salle MD, FACS Entered By: Evlyn KannerBritto, Joshva Labreck on 06/13/2017 14:49:40 [name], [name] ([id number]) -------------------------------------------------------------------------------- HPI Details Patient Name: [name], [name] Date of Service: 06/13/2017 1:15 PM Medical Record Number: [id number] Patient Account Number: [id number] Date of Birth/Sex: [birth year](71 y.o. Female) Treating RN: Huel CoventryWoody, Kim Primary Care Provider: Quintin AltoBURDINE, [name] Other Clinician: Referring Provider: Nanetta BattyBERRY, [name] Treating Provider/Extender: Rudene ReBritto, Derris Millan Weeks in Treatment: 0 History of Present Illness Location: right foot toes Quality: Patient reports experiencing a sharp pain to affected area(s). Severity: Patient states wound are getting worse. Duration: Patient has had the wound for < 2 weeks prior to presenting for treatment Timing: Pain in wound is constant (hurts all the time) Context: The wound appeared gradually over time Modifying Factors: Other treatment(s) tried include:recent interventional process to place a stent in her right lower extremity for critical limb ischemia Associated Signs and Symptoms: Patient reports having foul odor. HPI Description: 71 year old female who was referred by  Dr. Nanetta BattyJonathan Berry, who performed a endovascular procedure on 05/26/2017, for critical limb ischemia. Her angiogram showed a 99% below the knee popliteal stenosis which is stented with a 4 mm drug eluting stent. On 06/10/2017 he noted that her pulses are palpable and her Dopplers have normalized. He referred her to wound care for further advice and put her on Keflex 500 mg twice daily for 10 days. She has a past medical history of asthma, coronary artery disease, CHF, hyperlipidemia, ischemic cardiomyopathy, peripheral arterial disease and type 2 diabetes mellitus. She is status post recent abdominal aortogram with right lower extremity stenting, cholecystectomy, CABG, parathyroidectomy, thyroidectomy. She is a former smoker and quit in 1988. 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 The right ABI was 1.0 and biphasic and the left ABI was 0.93 and biphasic. Toe pressures were abnormal with the right being 0.27 and left being 0.31. the duplex study done showed a patent right popliteal artery stent without evidence of focal stenosis or obstruction. of note the patient started having signs of critical limb ischemia somewhere at the end of November and after an urgent workup was taken up for limb salvage with a successfully placed stent by Dr. Nanetta BattyJonathan Berry, with good arterial perfusion postprocedure with normal ABIs noted on 06/10/2017. Electronic Signature(s) Signed: 06/13/2017 2:52:03 PM By: Evlyn KannerBritto, Niko Penson MD, FACS Previous Signature: 06/13/2017 1:40:30 PM Version By: Evlyn KannerBritto, Reakwon Barren MD, FACS Previous Signature: 06/13/2017 1:39:54 PM Version By: Evlyn KannerBritto, Breelyn Icard MD, FACS Previous Signature: 06/13/2017 1:32:22 PM Version By: Evlyn KannerBritto, Jamin Humphries MD, FACS Entered By: Evlyn KannerBritto, Cynia Abruzzo on 06/13/2017 14:52:03 [name], [name]  ([id number]) -------------------------------------------------------------------------------- Physical Exam Details Patient Name: [name], [name] Date of Service: 06/13/2017 1:15 PM Medical Record Number: [id number] Patient Account Number: [id number] Date of Birth/Sex: [birth year] (71 y.o. Female) Treating RN: Huel CoventryWoody, Kim Primary Care Provider: Quintin AltoBURDINE, [name] Other Clinician: Referring Provider: Allyson SabalBERRY,  [name] Treating Provider/Extender: Evlyn Kanner Weeks in Treatment: 0 Constitutional . Pulse regular. Respirations normal and unlabored. Afebrile. . Eyes Nonicteric. Reactive to light. Ears, Nose, Mouth, and Throat Lips, teeth, and gums WNL.Marland Kitchen Moist mucosa without lesions. Neck supple and nontender. No palpable supraclavicular or cervical adenopathy. Normal sized without goiter. Respiratory WNL. No retractions.. Cardiovascular Pedal Pulses WNL. No clubbing, cyanosis or edema. Chest Breasts symmetical and no nipple discharge.. Breast tissue WNL, no masses, lumps, or tenderness.. Gastrointestinal (GI) Abdomen without masses or tenderness.. No liver or spleen enlargement or tenderness.. Lymphatic No adneopathy. No adenopathy. No adenopathy. Musculoskeletal Adexa without tenderness or enlargement.. Digits and nails w/o clubbing, cyanosis, infection, petechiae, ischemia, or inflammatory conditions.. Integumentary (Hair, Skin) No suspicious lesions. No crepitus or fluctuance. No peri-wound warmth or erythema. No masses.Marland Kitchen Psychiatric Judgement and insight Intact.. No evidence of depression, anxiety, or agitation.. Notes the patient has patches of dry gangrene on her right big toe and also several of her other toes on the right foot. In the interdigital area she has got ulcerations on both medial and lateral surface of the second third and fourth and fifth toe. There are extremely tender and with meticulous washing out I was able to see that she has got ulceration in this area. There is  no proximal cellulitis. Electronic Signature(s) Signed: 06/13/2017 2:53:07 PM By: Evlyn Kanner MD, FACS Entered By: Evlyn Kanner on 06/13/2017 14:53:06 [name], [name] ([id number]) -------------------------------------------------------------------------------- Physician Orders Details Patient Name: [name] Date of Service: 06/13/2017 1:15 PM Medical Record Number: [id number] Patient Account Number: [id number] Date of Birth/Sex: 07-06-1945 (71 y.o. Female) Treating RN: Huel Coventry Primary Care Provider: Quintin Alto Other Clinician: Referring Provider: Nanetta Batty Treating Provider/Extender: Rudene Re in Treatment: 0 Verbal / Phone Orders: No Diagnosis Coding Wound Cleansing Wound #1 Right,Distal Foot o Clean wound with Normal Saline. o Cleanse wound with mild soap and water o May Shower, gently pat wound dry prior to applying new dressing. Anesthetic (add to Medication List) Wound #1 Right,Distal Foot o Hurricaine Topical Anesthetic Spray applied to wound bed prior to debridement (In Clinic Only). Primary Wound Dressing Wound #1 Right,Distal Foot o Other: - Silvercell Ag; in between 2nd-5th toes. Secondary Dressing Wound #1 Right,Distal Foot o ABD and Kerlix/Conform Dressing Change Frequency Wound #1 Right,Distal Foot o Change Dressing Monday, Wednesday, Friday Follow-up Appointments o Return Appointment in 1 week. Edema Control Wound #1 Right,Distal Foot o Elevate legs to the level of the heart and pump ankles as often as possible Home Health Wound #1 Right,Distal Foot o Initiate Home Health for Skilled Nursing - [facility] o Home Health Nurse may visit PRN to address patientos wound care needs. o FACE TO FACE ENCOUNTER: MEDICARE and MEDICAID PATIENTS: I certify that this patient is under my care and that I had a face-to-face encounter that meets the physician face-to-face encounter requirements with this patient on this  date. The encounter with the patient was in whole or in part for the following MEDICAL CONDITION: (primary reason for Home Healthcare) MEDICAL NECESSITY: I certify, that based on my findings, NURSING services are a medically necessary home health service. HOME BOUND STATUS: I certify that my clinical findings support that this patient is homebound (i.e., Due to illness or injury, pt requires aid of supportive devices such as crutches, cane, wheelchairs, walkers, the use of special transportation or the assistance of another person to leave their place of residence. There is a normal inability to leave the home and doing so requires considerable  and taxing effort. Other absences are for medical reasons / religious services and are infrequent or of short duration when for other reasons). Mikita, [name] ([id number]) o If current dressing causes regression in wound condition, may D/C ordered dressing product/s and apply Normal Saline Moist Dressing daily until next Wound Healing Center / Other MD appointment. Notify Wound Healing Center of regression in wound condition at [phone]. o Please direct any NON-WOUND related issues/requests for orders to patient's Primary Care Physician Patient Medications Allergies: oxycodone, Ranexa, Statins-Hmg-Coa Reductase Inhibitors Notifications Medication Indication Start End benzocaine DOSE topical 20 % aerosol - aerosol topical Electronic Signature(s) Signed: 06/13/2017 4:44:52 PM By: Evlyn Kanner MD, FACS Signed: 06/14/2017 11:38:18 AM By: Elliot Gurney, BSN, RN, CWS, Kim RN, BSN Previous Signature: 06/13/2017 3:08:43 PM Version By: Elliot Gurney, BSN, RN, CWS, Kim RN, BSN Entered By: Elliot Gurney, BSN, RN, CWS, Kim on 06/13/2017 15:13:26 Doublin, [name] ([id number]) -------------------------------------------------------------------------------- Prescription 06/13/2017 Patient Name: [name] Provider: Evlyn Kanner MD Date of Birth: 07/20/1945 NPI#:  [id number] Sex: F DEA#: [id number] Phone #: [phone] License #: Patient Address: [facility] [address] [facility] [address] [facility] [place], [place] [address] [address], [address] [place], [place] [address] [phone] Allergies oxycodone Ranexa Statins-Hmg-Coa Reductase Inhibitors Medication Medication: Route: Strength: Form: benzocaine 20 % topical aerosol topical 20 % aerosol Class: TOPICAL LOCAL ANESTHETICS Dose: Frequency / Time: Indication: aerosol topical Number of Refills: Number of Units: 0 Generic Substitution: Start Date: End Date: One Time Use: Substitution Permitted No Note to Pharmacy: Signature(s): Date(s): Electronic Signature(s) Signed: 06/13/2017 4:44:52 PM By: Evlyn Kanner MD, FACS Signed: 06/14/2017 11:38:18 AM By: Elliot Gurney BSN, RN, CWS, Kim RN, BSN Rorie, [place] ([id number]) Entered By: Elliot Gurney, BSN, RN, CWS, Kim on 06/13/2017 15:13:28 [name], [name] ([id number]) --------------------------------------------------------------------------------  Problem List Details Patient Name: [name] Date of Service: 06/13/2017 1:15 PM Medical Record Number: [id number] Patient Account Number: [id number] Date of Birth/Sex: 02/21/1946 (71 y.o. 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 Female) Treating RN: Huel Coventry Primary Care Provider: Quintin Alto Other Clinician: Referring Provider: Nanetta Batty Treating Provider/Extender: Rudene Re in Treatment: 0 Subjective Chief Complaint Information obtained from Patient Patient presents to the wound care center today with an open arterial ulcer to the interdigital areas of her right foot which she's had for about 2 weeks History of Present Illness (HPI) The following HPI elements were documented for the patient's wound: Location: right foot toes Quality: Patient reports experiencing a sharp pain to affected area(s). Severity: Patient states wound are getting worse. Duration: Patient has had the wound for < 2 weeks prior to presenting for treatment Timing: Pain in wound is constant (hurts all the time) Context: The wound appeared gradually  over time Modifying Factors: Other treatment(s) tried include:recent interventional process to place a stent in her right lower extremity for critical limb ischemia Associated Signs and Symptoms: Patient reports having foul odor. 71 year old female who was referred by Dr. Nanetta Batty, who performed a endovascular procedure on 05/26/2017, for critical limb ischemia. Her angiogram showed a 99% below the knee popliteal stenosis which is stented with a 4 mm drug eluting stent. On 06/10/2017 he noted that her pulses are palpable and her Dopplers have normalized. He referred her to wound care for further advice and put her on Keflex 500 mg twice daily for 10 days. She has a  past medical history of asthma, coronary artery disease, CHF, hyperlipidemia, ischemic cardiomyopathy, peripheral arterial disease and type 2 diabetes mellitus. She is status post recent abdominal aortogram with right lower extremity stenting, cholecystectomy, CABG, parathyroidectomy, thyroidectomy. She is a former smoker and quit in 1988. 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 The right ABI was 1.0 and biphasic and the left ABI was 0.93 and biphasic. Toe pressures were abnormal with the right being 0.27 and left being 0.31. the duplex study done showed a patent right popliteal artery stent without evidence of focal stenosis or obstruction. of note the patient started having signs of critical limb ischemia somewhere at the end of November and after an urgent workup was taken up for limb salvage with a successfully placed stent by Dr. Nanetta Batty, with good arterial perfusion postprocedure with normal ABIs noted on 06/10/2017. Wound History Patient presents with 7 open wounds that have been present for approximately 3 weeks. Patient has been treating wounds in the following manner: open to air. Laboratory tests have been performed in the last month. Patient reportedly has not tested positive for an antibiotic resistant organism. Patient reportedly has not tested positive for osteomyelitis. Patient reportedly has not had testing performed to evaluate circulation in the legs. Patient experiences the following problems associated with their wounds: swelling. Patient History Information obtained from Patient. Allergies [name], [name] ([id number]) oxycodone, Ranexa, Statins-Hmg-Coa Reductase Inhibitors Family History Heart Disease - Father,Mother, Hypertension - Mother,Father, Stroke - Mother, No family history of Cancer, Diabetes, Hereditary Spherocytosis, Kidney Disease, Lung Disease, Seizures,  Thyroid Problems, Tuberculosis. Social History Former smoker - quit 40 years ago, Marital Status - Married, Alcohol Use - Never, Drug Use - No History, Caffeine Use - Daily. Medical History Eyes Denies history of Cataracts, Glaucoma, Optic Neuritis Ear/Nose/Mouth/Throat Denies history of Chronic sinus problems/congestion, Middle ear problems Hematologic/Lymphatic Patient has history of Anemia - history Denies history of Hemophilia, Human Immunodeficiency Virus, Lymphedema, Sickle Cell Disease Respiratory Denies history of Aspiration, Asthma, Chronic Obstructive Pulmonary Disease (COPD), Pneumothorax, Sleep Apnea, Tuberculosis Cardiovascular Patient has history of Arrhythmia - a fib, Congestive Heart Failure, Hypertension, Myocardial Infarction - 13 years ago, Peripheral Arterial Disease Denies history of Coronary Artery Disease, Deep Vein Thrombosis, Hypotension, Peripheral Venous Disease, Phlebitis, Vasculitis Gastrointestinal Denies history of Cirrhosis , Colitis, Crohn s, Hepatitis A, Hepatitis B, Hepatitis C Endocrine Denies history of Type I Diabetes, Type II Diabetes Immunological Denies history of Lupus Erythematosus, Raynaud s, Scleroderma Musculoskeletal Denies history of Gout, Rheumatoid Arthritis, Osteoarthritis, Osteomyelitis Neurologic Patient has history of Neuropathy - hands Oncologic Denies history of Received Chemotherapy, Received Radiation Review of Systems (ROS) Constitutional Symptoms (General Health) The patient has no complaints or symptoms. Eyes Complains or has symptoms of Glasses / Contacts - glasses.  Hematologic/Lymphatic The patient has no complaints or symptoms. Respiratory The patient has no complaints or symptoms. Cardiovascular Complains or has symptoms of LE edema. Gastrointestinal The patient has no complaints or symptoms. Endocrine The patient has no complaints or symptoms. Genitourinary The patient has no complaints or  symptoms. Immunological The patient has no complaints or symptoms. Bouchard, Khushi Reece AgarG. ([id number]) Integumentary (Skin) Complains or has symptoms of Wounds - left great toe. Denies complaints or symptoms of Bleeding or bruising tendency, Breakdown, Swelling. Musculoskeletal The patient has no complaints or symptoms. Neurologic The patient has no complaints or symptoms. Oncologic The patient has no complaints or symptoms. Psychiatric The patient has no complaints or symptoms. Medications benzocaine 20 % topical aerosol topical aerosol topical I have reviewed her list of medications which include albuterol, amiodarone, and requests, aspirin, Coreg, Keflex, Plavix, hydrocodone, Cozaar, nitroglycerin, Protonix, Demadex. Objective Constitutional Pulse regular. Respirations normal and unlabored. Afebrile. Vitals Time Taken: 1:20 PM, Height: 63 in, Source: Measured, Weight: 176 lbs, Source: Measured, BMI: 31.2, Temperature: 98.1 F, Pulse: 93 bpm, Respiratory Rate: 16 breaths/min, Blood Pressure: 112/62 mmHg. Eyes Nonicteric. Reactive to light. Ears, Nose, Mouth, and Throat Lips, teeth, and gums WNL.Marland Kitchen. Moist mucosa without lesions. Neck supple and nontender. No palpable supraclavicular or cervical adenopathy. Normal sized without goiter. Respiratory WNL. No retractions.. Cardiovascular Pedal Pulses WNL. No clubbing, cyanosis or edema. Chest Breasts symmetical and no nipple discharge.. Breast tissue WNL, no masses, lumps, or tenderness.. Gastrointestinal (GI) Abdomen without masses or tenderness.. No liver or spleen enlargement or tenderness.. Lymphatic Taite, Oaklie G. ([id number]) No adneopathy. No adenopathy. No adenopathy. Musculoskeletal Adexa without tenderness or enlargement.. Digits and nails w/o clubbing, cyanosis, infection, petechiae, ischemia, or inflammatory conditions.Marland Kitchen. Psychiatric Judgement and insight Intact.. No evidence of depression, anxiety, or  agitation.. General Notes: the patient has patches of dry gangrene on her right big toe and also several of her other toes on the right foot. In the interdigital area she has got ulcerations on both medial and lateral surface of the second third and fourth and fifth toe. There are extremely tender and with meticulous washing out I was able to see that she has got ulceration in this area. There is no proximal cellulitis. Integumentary (Hair, Skin) No suspicious lesions. No crepitus or fluctuance. No peri-wound warmth or erythema. No masses.. Wound #1 status is Open. Original cause of wound was Gradually Appeared. The wound is located on the Right,Distal Foot. The wound measures 16cm length x 2cm width x 0.1cm depth; 25.133cm^2 area and 2.513cm^3 volume. There is no tunneling or undermining noted. There is a large amount of sanguinous drainage noted. The wound margin is indistinct and nonvisible. General Notes: When arriving to the wound clinic today patient's 2nd-5th toes on her right foot are matted together. It appears that exudate, blood and klenex tissues have formed a glue like substance that have stuck the toes together. After attempting to clean with saline, warm water and surgical scrub I have sprayed the area with Hurricaine Spray in hopes that I can relieve some of her pain so that I can access the wounded areas. I am unable to see where the wounded areas actually are located. The patient is in extreme pain when touching her toes and screams out when you try to separate them. Wounds can be seen in between the toes. Patient has lateral wounds on 2nd toe; medial and lateral on 3rd toe, medial and lateral on 4th toe and medial on 5th toe. Due to extreme pain experienced by  the patient I was unable to measure or assess the wounds. 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 Since then she has had some demarcation around her toes and there are areas of dry gangrene and also areas between the interdigital spaces of her toes where she has got open ulceration. After review today, I have discussed with the patient and her caregiver, that she needs aggressive wound care along with appropriate oral antibiotics. At any stage, if her condition gets worse, she will need to be admitted to the hospital for IV antibiotics. After demarcation, there may be a possibility of dry patches of gangrene over her toes which may ultimately need to be treated surgically. In the meanwhile we will treat her with aggressive wound care and I have recommended: 1. Washing the wounds well with soap and water and then applying silver alginate between the toes and changing this daily 2. Adequate protein, vitamin A, vitamin C and zinc [name], Dinia G. ([id number]) 3. At some stage later when she has demarcated we will get an x-ray of her foot 4. Regular visits to the wound center She and her caregiver have had all questions answered and I have discussed the treatment plan with Dr. Nanetta Batty her interventional cardiologist. Plan Wound Cleansing: Wound #1 Right,Distal Foot: Clean wound with Normal Saline. Cleanse wound with mild soap and water May Shower, gently pat wound dry prior to applying new dressing. Anesthetic (add to Medication List): Wound #1 Right,Distal Foot: Hurricaine Topical Anesthetic Spray applied to wound bed prior to debridement (In Clinic Only). Primary Wound Dressing: Wound #1 Right,Distal Foot: Other: - Silvercell Ag; in  between 2nd-5th toes. Secondary Dressing: Wound #1 Right,Distal Foot: ABD and Kerlix/Conform Dressing Change Frequency: Wound #1 Right,Distal Foot: Change Dressing Monday, Wednesday, Friday Follow-up Appointments: Return Appointment in 1 week. Edema Control: Wound #1 Right,Distal Foot: Elevate legs to the level of the heart and pump ankles as often as possible Home Health: Wound #1 Right,Distal Foot: Initiate Home Health for Skilled Nursing - [facility] Nurse may visit PRN to address patient s wound care needs. FACE TO FACE ENCOUNTER: MEDICARE and MEDICAID PATIENTS: I certify that this patient is under my care and that I had a face-to-face encounter that meets the physician face-to-face encounter requirements with this patient on this date. The encounter with the patient was in whole or in part for the following MEDICAL CONDITION: (primary reason for Home Healthcare) MEDICAL NECESSITY: I certify, that based on my findings, NURSING services are a medically necessary home health service. HOME BOUND STATUS: I certify that my clinical findings support that this patient is homebound (i.e., Due to illness or injury, pt requires aid of supportive devices such as crutches, cane, wheelchairs, walkers, the use of special transportation or the assistance of another person to leave their place of residence. There is a normal inability to leave the home and doing so requires considerable and taxing effort. Other absences are for medical reasons / religious services and are infrequent or of short duration when for other reasons). If current dressing causes  regression in wound condition, may D/C ordered dressing product/s and apply Normal Saline Moist Dressing daily until next Wound Healing Center / Other MD appointment. Notify Wound Healing Center of regression in wound condition at [phone]. Please direct any NON-WOUND related issues/requests for orders to patient's Primary Care  Physician The following medication(s) was prescribed: benzocaine topical 20 % aerosol aerosol topical this 71 year old patient, who had critical limb ischemia on the right foot, had a limb salvage procedure done on December 3 with a good result of opening out her lower extremity vasculature. Since then she has had some demarcation around her toes Noecker, Yessika G. ([id number]) and there are areas of dry gangrene and also areas between the interdigital spaces of her toes where she has got open ulceration. After review today, I have discussed with the patient and her caregiver, that she needs aggressive wound care along with appropriate oral antibiotics. At any stage, if her condition gets worse, she will need to be admitted to the hospital for IV antibiotics. After demarcation, there may be a possibility of dry patches of gangrene over her toes which may ultimately need to be treated surgically. In the meanwhile we will treat her with aggressive wound care and I have recommended: 1. Washing the wounds well with soap and water and then applying silver alginate between the toes and changing this daily 2. Adequate protein, vitamin A, vitamin C and zinc 3. At some stage later when she has demarcated we will get an x-ray of her foot 4. Regular visits to the wound center She and her caregiver have had all questions answered and I have discussed the treatment plan with Dr. Nanetta Batty her interventional cardiologist. Electronic Signature(s) Signed: 06/13/2017 4:42:22 PM By: Evlyn Kanner MD, FACS Previous Signature: 06/13/2017 2:57:56 PM Version By: Evlyn Kanner MD, FACS Entered By: Evlyn Kanner on 06/13/2017 16:42:21 Sigley, [name] ([id number]) -------------------------------------------------------------------------------- ROS/PFSH Details Patient Name: [name] Date of Service: 06/13/2017 1:15 PM Medical Record Number: [id number] Patient Account Number: [id number] Date of Birth/Sex:  Oct 08, 1945 (71 y.o. Female) Treating RN: Huel Coventry Primary Care Provider: Quintin Alto Other Clinician: Referring Provider: Nanetta Batty Treating Provider/Extender: Rudene Re in Treatment: 0 Information Obtained From Patient Wound History Do you currently have one or more open woundso Yes How many open wounds do you currently haveo 7 Approximately how long have you had your woundso 3 weeks How have you been treating your wound(s) until nowo open to air Has your wound(s) ever healed and then re-openedo No Have you had any lab work done in the past montho Yes Who ordered the lab work doneo [facility] Have you tested positive for an antibiotic resistant organism (MRSA, VRE)o No Have you tested positive for osteomyelitis (bone infection)o No Have you had any tests for circulation on your legso No Have you had other problems associated with your woundso Swelling Eyes Complaints and Symptoms: Positive for: Glasses / Contacts - glasses Medical History: Negative for: Cataracts; Glaucoma; Optic Neuritis Cardiovascular Complaints and Symptoms: Positive for: LE edema Medical History: Positive for: Arrhythmia - a fib; Congestive Heart Failure; Hypertension; Myocardial Infarction - 13 years ago; Peripheral Arterial Disease Negative for: Coronary Artery Disease; Deep Vein Thrombosis; Hypotension; Peripheral Venous Disease; Phlebitis; Vasculitis Immunological Complaints and Symptoms: No Complaints or Symptoms Complaints and Symptoms: Negative for: Hives; Itching Medical History: Negative for: Lupus Erythematosus; Raynaudos; Scleroderma Integumentary (Skin) Complaints and Symptoms: Positive for: Wounds - left great toe Klem, Deissy G. ([id number]) Negative for: Bleeding or bruising  tendency; Breakdown; Swelling Constitutional Symptoms (General Health) Complaints and Symptoms: No Complaints or Symptoms Ear/Nose/Mouth/Throat Medical History: Negative for: Chronic  sinus problems/congestion; Middle ear problems Hematologic/Lymphatic Complaints and Symptoms: No Complaints or Symptoms Medical History: Positive for: Anemia - history Negative for: Hemophilia; Human Immunodeficiency Virus; Lymphedema; Sickle Cell Disease Respiratory Complaints and Symptoms: No Complaints or Symptoms Medical History: Negative for: Aspiration; Asthma; Chronic Obstructive Pulmonary Disease (COPD); Pneumothorax; Sleep Apnea; Tuberculosis Gastrointestinal Complaints and Symptoms: No Complaints or Symptoms Medical History: Negative for: Cirrhosis ; Colitis; Crohnos; Hepatitis A; Hepatitis B; Hepatitis C Endocrine Complaints and Symptoms: No Complaints or Symptoms Medical History: Negative for: Type I Diabetes; Type II Diabetes Genitourinary Complaints and Symptoms: No Complaints or Symptoms Musculoskeletal Complaints and Symptoms: No Complaints or Symptoms Medical History: Negative for: Gout; Rheumatoid Arthritis; Osteoarthritis; Osteomyelitis Scarpino, Denelle G. ([id number]) Neurologic Complaints and Symptoms: No Complaints or Symptoms Medical History: Positive for: Neuropathy - hands Oncologic Complaints and Symptoms: No Complaints or Symptoms Medical History: Negative for: Received Chemotherapy; Received Radiation Psychiatric Complaints and Symptoms: No Complaints or Symptoms Immunizations Pneumococcal Vaccine: Received Pneumococcal Vaccination: Yes Immunization Notes: up to date Implantable Devices Family and Social History Cancer: No; Diabetes: No; Heart Disease: Yes - Father,Mother; Hereditary Spherocytosis: No; Hypertension: Yes - Mother,Father; Kidney Disease: No; Lung Disease: No; Seizures: No; Stroke: Yes - Mother; Thyroid Problems: No; Tuberculosis: No; Former smoker - quit 40 years ago; Marital Status - Married; Alcohol Use: Never; Drug Use: No History; Caffeine Use: Daily; Financial Concerns: No; Food, Clothing or Shelter Needs: No; Support  System Lacking: No; Transportation Concerns: No; Advanced Directives: Yes; Patient does not want information on Advanced Directives; Living Will: Yes; Medical Power of Attorney: Yes - son Pasty Arch) Signed: 06/13/2017 4:44:52 PM By: Evlyn Kanner MD, FACS Signed: 06/14/2017 11:38:18 AM By: Elliot Gurney, BSN, RN, CWS, Kim RN, BSN Entered By: Elliot Gurney, BSN, RN, CWS, Kim on 06/13/2017 13:33:41 Steel, [name] ([id number]) -------------------------------------------------------------------------------- SuperBill Details Patient Name: [name] Date of Service: 06/13/2017 Medical Record Number: [id number] Patient Account Number: [id number] Date of Birth/Sex: 10-01-1945 (71 y.o. 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 nonbiological bypass graft(s) of the extremities with gangrene, right leg Facility Procedures CPT4 Code: 29528413 Description: 99214 - WOUND CARE VISIT-LEV 4 EST PT Modifier: Quantity: 1 Physician Procedures CPT4 Code Description: 2440102 72536 - WC PHYS LEVEL 4 - NEW PT ICD-10 Diagnosis Description I70.235 Atherosclerosis of native arteries of right leg with ulceration of L97.512 Non-pressure chronic ulcer of other part of right foot with fat la I70.661  Atherosclerosis of nonbiological bypass graft(s) of the extremitie Modifier: other part of yer exposed s with gangren Quantity: 1 foot e, right leg Electronic Signature(s) Signed: 06/13/2017 3:10:22 PM By: Elliot Gurney, BSN, RN, CWS, Kim RN, BSN Signed: 06/13/2017 4:44:52 PM By: Evlyn Kanner MD, FACS Previous  Signature: 06/13/2017 2:58:22 PM Version By: Evlyn Kanner MD, FACS Entered By: Elliot Gurney, BSN, RN, CWS, Kim on 06/13/2017 15:10:22

## 2017-06-14 NOTE — Progress Notes (Signed)
KEHINDE, TOTZKE ([id number]) Visit Report for 06/13/2017 Allergy List Details Patient Name: [name], [name] Date of Service: 06/13/2017 1:15 PM Medical Record Number: [id number] Patient Account Number: [id number] Date of Birth/Sex: 07-07-1945 (71 y.o. Female) Treating RN: [name] Primary Care [name]: [name] Other Clinician: Referring [name]: [name] Treating Kateri Balch/Extender: [name] in Treatment: 0 Allergies Active Allergies oxycodone Ranexa Statins-Hmg-Coa Reductase Inhibitors Allergy Notes Electronic Signature(s) Signed: 06/14/2017 11:38:18 AM By: [name], BSN, RN, CWS, Kim RN, BSN Entered By: [name], BSN, RN, CWS, [name] on 06/13/2017 13:26:12 [name], [name] ([id number]) -------------------------------------------------------------------------------- Arrival Information Details Patient Name: [name] Date of Service: 06/13/2017 1:15 PM Medical Record Number: [id number] Patient Account Number: [id number] Date of Birth/Sex: 1946/02/28 (71 y.o. Female) Treating RN: [name] Primary Care [name]: [name] Other Clinician: Referring Ruberta Holck: [name] Treating [name]/Extender: [name] in Treatment: 0 Visit Information Patient Arrived: Wheel Chair Arrival Time: 13:17 Accompanied By: friend Transfer Assistance: Manual Patient Identification Verified: Yes Secondary Verification Process Yes Completed: Patient Requires Transmission-Based No Precautions: Patient Has Alerts: Yes Patient Alerts: Patient on Blood Thinner Prediabetic Eliquis Electronic Signature(s) Signed: 06/14/2017 11:38:18 AM By: [name], BSN, RN, CWS, Kim RN, BSN Entered By: [name], BSN, RN, CWS, [name] on 06/13/2017 13:19:18 [name], [name] ([id number]) -------------------------------------------------------------------------------- Clinic Level of Care Assessment Details Patient Name: [name] Date of Service: 06/13/2017 1:15  PM Medical Record Number: [id number] Patient Account Number: [id number] Date of Birth/Sex: 23-May-1946 (71 y.o. Female) Treating RN: [name] Primary Care [name]: [name] Other Clinician: Referring [name]: [name] Treating [name]/Extender: [name] in Treatment: 0 Clinic Level of Care Assessment Items TOOL 2 Quantity Score []  - Use when only an EandM is performed on the INITIAL visit 0 ASSESSMENTS - Nursing Assessment / Reassessment X - General Physical Exam (combine w/ comprehensive assessment (listed just below) when 1 20 performed on new pt. evals) X- 1 25 Comprehensive Assessment (HX, ROS, Risk Assessments, Wounds Hx, etc.) ASSESSMENTS - Wound and Skin Assessment / Reassessment []  - Simple Wound Assessment / Reassessment - one wound 0 X- 1 5 Complex Wound Assessment / Reassessment - multiple wounds []  - 0 Dermatologic / Skin Assessment (not related to wound area) ASSESSMENTS - Ostomy and/or Continence Assessment and Care []  - Incontinence Assessment and Management 0 []  - 0 Ostomy Care Assessment and Management (repouching, etc.) PROCESS - Coordination of Care []  - Simple Patient / Family Education for ongoing care 0 X- 1 20 Complex (extensive) Patient / Family Education for ongoing care []  - 0 Staff obtains Chiropractor, Records, Test Results / Process Orders []  - 0 Staff telephones HHA, Nursing Homes / Clarify orders / etc []  - 0 Routine Transfer to another Facility (non-emergent condition) []  - 0 Routine Hospital Admission (non-emergent condition) X- 1 15 New Admissions / Manufacturing engineer / Ordering NPWT, Apligraf, etc. []  - 0 Emergency Hospital Admission (emergent condition) X- 1 10 Simple Discharge Coordination []  - 0 Complex (extensive) Discharge Coordination PROCESS - Special Needs []  - Pediatric / Minor Patient Management 0 []  - 0 Isolation Patient Management [name], [name] ([id number]) []  - 0 Hearing / Language /  Visual special needs []  - 0 Assessment of Community assistance (transportation, D/C planning, etc.) []  - 0 Additional assistance / Altered mentation []  - 0 Support Surface(s) Assessment (bed, cushion, seat, etc.) INTERVENTIONS - Wound Cleansing / Measurement X - Wound Imaging (photographs - any number of wounds) 1 5 []  - 0 Wound Tracing (instead of photographs) []  -  0 Simple Wound Measurement - one wound X- 1 5 Complex Wound Measurement - multiple wounds []  - 0 Simple Wound Cleansing - one wound X- 1 5 Complex Wound Cleansing - multiple wounds INTERVENTIONS - Wound Dressings []  - Small Wound Dressing one or multiple wounds 0 X- 1 15 Medium Wound Dressing one or multiple wounds []  - 0 Large Wound Dressing one or multiple wounds []  - 0 Application of Medications - injection INTERVENTIONS - Miscellaneous []  - External ear exam 0 []  - 0 Specimen Collection (cultures, biopsies, blood, body fluids, etc.) []  - 0 Specimen(s) / Culture(s) sent or taken to Lab for analysis []  - 0 Patient Transfer (multiple staff / Nurse, adultHoyer Lift / Similar devices) []  - 0 Simple Staple / Suture removal (25 or less) []  - 0 Complex Staple / Suture removal (26 or more) []  - 0 Hypo / Hyperglycemic Management (close monitor of Blood Glucose) []  - 0 Ankle / Brachial Index (ABI) - do not check if billed separately Has the patient been seen at the hospital within the last three years: Yes Total Score: 125 Level Of Care: New/Established - Level 4 Electronic Signature(s) Signed: 06/14/2017 11:38:18 AM By: [name], BSN, RN, CWS, Kim RN, BSN Entered By: [name], BSN, RN, CWS, [name] on 06/13/2017 15:10:09 [name], [name] ([id number]) -------------------------------------------------------------------------------- Encounter Discharge Information Details Patient Name: [name], [name] Date of Service: 06/13/2017 1:15 PM Medical Record Number: [id number] Patient Account Number: [id number] Date of Birth/Sex:  08-19-1945 (71 y.o. Female) Treating RN: [name] CoventryWoody, [name] Primary Care Onetta Spainhower: [name] AltoBURDINE, STEVEN Other Clinician: Referring Maelie Chriswell: [name] BattyBERRY, JONATHAN Treating Allaina Brotzman/Extender: [name] ReBritto, Errol Weeks in Treatment: 0 Encounter Discharge Information Items Discharge Pain Level: 0 Discharge Condition: Stable Ambulatory Status: Wheelchair Discharge Destination: Home Transportation: Private Auto Accompanied By: friend Schedule Follow-up Appointment: Yes Medication Reconciliation completed and Yes provided to Patient/Care Ramaj Frangos: Provided on Clinical Summary of Care: 06/13/2017 Form Type Recipient Paper Patient MD Electronic Signature(s) Signed: 06/13/2017 3:11:33 PM By: [name], BSN, RN, CWS, Kim RN, BSN Entered By: [name], BSN, RN, CWS, [name] on 06/13/2017 15:11:33 [name], [name] ([id number]) -------------------------------------------------------------------------------- Lower Extremity Assessment Details Patient Name: [name], Melisia G. Date of Service: 06/13/2017 1:15 PM Medical Record Number: [id number] Patient Account Number: [id number] Date of Birth/Sex: 08-19-1945 (71 y.o. Female) Treating RN: [name] CoventryWoody, [name] Primary Care Octavion Mollenkopf: [name] AltoBURDINE, STEVEN Other Clinician: Referring Lenetta Piche: [name] BattyBERRY, JONATHAN Treating Kaiea Esselman/Extender: [name] ReBritto, Errol Weeks in Treatment: 0 Edema Assessment Assessed: [Left: No] [Right: No] [Left: Edema] [Right: :] Calf Left: Right: Point of Measurement: 31 cm From Medial Instep 37 cm 35.6 cm Ankle Left: Right: Point of Measurement: 10 cm From Medial Instep 21 cm 21 cm Vascular Assessment Pulses: Dorsalis Pedis Palpable: [Left:No] [Right:No] Doppler Audible: [Left:Yes] [Right:Yes] Posterior Tibial Palpable: [Left:No] [Right:Yes] Doppler Audible: [Left:Yes] [Right:Yes] Extremity colors, hair growth, and conditions: Extremity Color: [Left:Normal] [Right:Normal] Hair Growth on Extremity: [Left:Yes] [Right:Yes] Temperature of Extremity: [Left:Cool]  [Right:Cool] Capillary Refill: [Left:< 3 seconds] Toe Nail Assessment Left: Right: Thick: Yes Yes Discolored: Yes Yes Deformed: Yes Yes Improper Length and Hygiene: No No Electronic Signature(s) Signed: 06/14/2017 11:38:18 AM By: [name], BSN, RN, CWS, Kim RN, BSN Entered By: [name], BSN, RN, CWS, [name] on 06/13/2017 13:50:53 [name], [name] ([id number]) -------------------------------------------------------------------------------- Multi Wound Chart Details Patient Name: [name], Sharisse G. Date of Service: 06/13/2017 1:15 PM Medical Record Number: [id number] Patient Account Number: [id number] Date of Birth/Sex: 08-19-1945 (71 y.o. Female) Treating RN: [name] CoventryWoody, [name] Primary Care Fordyce Lepak: [name] AltoBURDINE, STEVEN Other Clinician: Referring Samayah Novinger: [name] BattyBERRY, JONATHAN Treating Zubair Lofton/Extender: [name] ReBritto, Errol Weeks in Treatment: 0  Vital Signs Height(in): 63 Pulse(bpm): 93 Weight(lbs): 176 Blood Pressure(mmHg): 112/62 Body Mass Index(BMI): 31 Temperature(F): 98.1 Respiratory Rate 16 (breaths/min): Photos: [1:No Photos] [N/A:N/A] Wound Location: [1:Right, Distal Foot] [N/A:N/A] Wounding Event: [1:Gradually Appeared] [N/A:N/A] Primary Etiology: [1:Arterial Insufficiency Ulcer] [N/A:N/A] Date Acquired: [1:05/30/2017] [N/A:N/A] Weeks of Treatment: [1:0] [N/A:N/A] Wound Status: [1:Open] [N/A:N/A] Clustered Wound: [1:Yes] [N/A:N/A] Pending Amputation on [1:Yes] [N/A:N/A] Presentation: Measurements L x W x D [1:16x2x0.1] [N/A:N/A] (cm) Area (cm) : [1:25.133] [N/A:N/A] Volume (cm) : [1:2.513] [N/A:N/A] Periwound Skin Texture: [1:No Abnormalities Noted] [N/A:N/A] Periwound Skin Moisture: [1:No Abnormalities Noted] [N/A:N/A] Periwound Skin Color: [1:No Abnormalities Noted No] [N/A:N/A N/A] Treatment Notes Electronic Signature(s) Signed: 06/13/2017 2:48:58 PM By: Evlyn Kanner MD, FACS Entered By: Evlyn Kanner on 06/13/2017 14:48:58 [name], [name]  ([id number]) -------------------------------------------------------------------------------- Multi-Disciplinary Care Plan Details Patient Name: [name] Date of Service: 06/13/2017 1:15 PM Medical Record Number: [id number] Patient Account Number: [id number] Date of Birth/Sex: 1945/12/23 (71 y.o. Female) Treating RN: [name] Primary Care Haisley Arens: [name] Other Clinician: Referring Celso Granja: [name] Treating Waylin Dorko/Extender: [name] in Treatment: 0 Active Inactive ` Abuse / Safety / Falls / Self Care Management Nursing Diagnoses: History of Falls Impaired home maintenance Potential for falls Goals: Patient will not experience any injury related to falls Date Initiated: 06/13/2017 Target Resolution Date: 07/14/2017 Goal Status: Active Patient will remain injury free related to falls Date Initiated: 06/13/2017 Target Resolution Date: 07/14/2017 Goal Status: Active Interventions: Assess fall risk on admission and as needed Treatment Activities: Patient referred to home care : 06/13/2017 Notes: ` Necrotic Tissue Nursing Diagnoses: Impaired tissue integrity related to necrotic/devitalized tissue Goals: Necrotic/devitalized tissue will be minimized in the wound bed Date Initiated: 06/13/2017 Target Resolution Date: 07/14/2017 Goal Status: Active Interventions: Assess patient pain level pre-, during and post procedure and prior to discharge Provide education on necrotic tissue and debridement process Treatment Activities: Apply topical anesthetic as ordered : 06/13/2017 Notes: [name], [name] ([id number]) ` Orientation to the Wound Care Program Nursing Diagnoses: Knowledge deficit related to the wound healing center program Goals: Patient/caregiver will verbalize understanding of the Wound Healing Center Program Date Initiated: 06/13/2017 Target Resolution Date: 07/14/2017 Goal Status: Active Interventions: Provide education on  orientation to the wound center Notes: ` Wound/Skin Impairment Nursing Diagnoses: Impaired tissue integrity Knowledge deficit related to ulceration/compromised skin integrity Goals: Ulcer/skin breakdown will have a volume reduction of 30% by week 4 Date Initiated: 06/13/2017 Target Resolution Date: 07/14/2017 Goal Status: Active Interventions: Assess ulceration(s) every visit Treatment Activities: Patient referred to home care : 06/13/2017 Topical wound management initiated : 06/13/2017 Notes: Electronic Signature(s) Signed: 06/13/2017 3:06:31 PM By: [name], BSN, RN, CWS, Kim RN, BSN Entered By: [name], BSN, RN, CWS, [name] on 06/13/2017 15:06:30 [name], [name] ([id number]) -------------------------------------------------------------------------------- Pain Assessment Details Patient Name: [name] Date of Service: 06/13/2017 1:15 PM Medical Record Number: [id number] Patient Account Number: [id number] Date of Birth/Sex: 1945-07-12 (71 y.o. Female) Treating RN: [name] Primary Care Alver Leete: [name] Other Clinician: Referring Marleny Faller: [name] Treating Naveed Humphres/Extender: [name] in Treatment: 0 Active Problems Location of Pain Severity and Description of Pain Patient Has Paino Yes Site Locations Pain Location: Pain in Ulcers With Dressing Change: No Duration of the Pain. Constant / Intermittento Intermittent Rate the pain. Current Pain Level: 4 Worst Pain Level: 10 Least Pain Level: 3 Character of Pain Describe the Pain: Shooting Pain Management and Medication Current Pain Management: Medication: Yes Goals for Pain Management Topical or injectable lidocaine is offered to patient  for acute pain when surgical debridement is performed. If needed, Patient is instructed to use over the counter pain medication for the following 24-48 hours after debridement. Wound care MDs do not prescribed pain medications. Patient has chronic pain or  uncontrolled pain. Patient has been instructed to make an appointment with their Primary Care Physician for pain management. Electronic Signature(s) Signed: 06/14/2017 11:38:18 AM By: [name], BSN, RN, CWS, Kim RN, BSN Entered By: [name], BSN, RN, CWS, [name] on 06/13/2017 13:20:20 Naef, [name] ([id number]) -------------------------------------------------------------------------------- Patient/Caregiver Education Details Patient Name: [name], Tamula G. Date of Service: 06/13/2017 1:15 PM Medical Record Number: [id number] Patient Account Number: [id number] Date of Birth/Gender: September 29, 1945 (71 y.o. Female) Treating RN: [name] CoventryWoody, [name] Primary Care Physician: [name] AltoBURDINE, STEVEN Other Clinician: Referring Physician: Nanetta BattyBERRY, JONATHAN Treating Physician/Extender: [name] ReBritto, Errol Weeks in Treatment: 0 Education Assessment Education Provided To: Patient Education Topics Provided Welcome To The Wound Care Center: Handouts: Welcome To The Wound Care Center Methods: Explain/Verbal Responses: State content correctly Wound/Skin Impairment: Handouts: Caring for Your Ulcer Methods: Demonstration, Explain/Verbal Responses: State content correctly Electronic Signature(s) Signed: 06/14/2017 11:38:18 AM By: [name], BSN, RN, CWS, Kim RN, BSN Entered By: [name], BSN, RN, CWS, [name] on 06/13/2017 15:12:12 [name], [name] ([id number]) -------------------------------------------------------------------------------- Wound Assessment Details Patient Name: [name], Minaal G. Date of Service: 06/13/2017 1:15 PM Medical Record Number: [id number] Patient Account Number: [id number] Date of Birth/Sex: September 29, 1945 (71 y.o. Female) Treating RN: [name] CoventryWoody, [name] Primary Care Onix Jumper: [name] AltoBURDINE, STEVEN Other Clinician: Referring Maclean Foister: [name] BattyBERRY, JONATHAN Treating Taleya Whitcher/Extender: [name] ReBritto, Errol Weeks in Treatment: 0 Wound Status Wound Number: 1 Primary Arterial Insufficiency Ulcer Etiology: Wound Location: Right Foot - Distal Wound  Open Wounding Event: Gradually Appeared Status: Date Acquired: 05/30/2017 Comorbid Anemia, Arrhythmia, Congestive Heart Failure, Weeks Of Treatment: 0 History: Hypertension, Myocardial Infarction, Peripheral Clustered Wound: Yes Arterial Disease, Neuropathy Pending Amputation On Presentation Photos Photo Uploaded By: [name], BSN, RN, CWS, [name] on 06/13/2017 15:42:55 Wound Measurements Length: (cm) 16 Width: (cm) 2 Depth: (cm) 0.1 Clustered Quantity: 7 Area: (cm) 25.133 Volume: (cm) 2.513 % Reduction in Area: 0% % Reduction in Volume: 0% Epithelialization: None Tunneling: No Undermining: No Wound Description Full Thickness Without Exposed Support Classification: Structures Wound Margin: Indistinct, nonvisible Exudate Large Amount: Exudate Type: Sanguinous Exudate Color: red Foul Odor After Cleansing: No Periwound Skin Texture Texture Color No Abnormalities Noted: No No Abnormalities Noted: No Moisture No Abnormalities Noted: No Wound Preparation Ulcer Cleansing: Rinsed/Irrigated with Saline, Wound Cleanser Topical Anesthetic Applied: Other: hurricaine spray, Yiu, Paxton G. ([id number]) Assessment Notes When arriving to the wound clinic today patient's 2nd-5th toes on her right foot are matted together. It appears that exudate, blood and klenex tissues have formed a glue like substance that have stuck the toes together. After attempting to clean with saline, warm water and surgical scrub I have sprayed the area with Hurricaine Spray in hopes that I can relieve some of her pain so that I can access the wounded areas. I am unable to see where the wounded areas actually are located. The patient is in extreme pain when touching her toes and screams out when you try to separate them. Wounds can be seen in between the toes. Patient has lateral wounds on 2nd toe; medial and lateral on 3rd toe, medial and lateral on 4th toe and medial on 5th toe. Due to extreme pain  experienced by the patient I was unable to measure or assess the wounds. Treatment Notes Wound #1 (Right, Distal Foot) 1. Cleansed with: Clean wound with Normal Saline Cleanse  wound with antibacterial soap and water 2. Anesthetic Hurricaine Topical Anesthetic Spray 4. Dressing Applied: Other dressing (specify in notes) 5. Secondary Dressing Applied ABD Pad Kerlix/Conform 7. Secured with Secretary/administrator) Signed: 06/13/2017 2:59:12 PM By: [name], BSN, RN, CWS, Kim RN, BSN Entered By: [name], BSN, RN, CWS, [name] on 06/13/2017 14:59:12 [name], [name] ([id number]) -------------------------------------------------------------------------------- Vitals Details Patient Name: [name] Date of Service: 06/13/2017 1:15 PM Medical Record Number: [id number] Patient Account Number: [id number] Date of Birth/Sex: 30-Dec-1945 (71 y.o. Female) Treating RN: [name] Primary Care Dean Goldner: [name] Other Clinician: Referring Dayvin Aber: [name] Treating Alvino Lechuga/Extender: [name] in Treatment: 0 Vital Signs Time Taken: 13:20 Temperature (F): 98.1 Height (in): 63 Pulse (bpm): 93 Source: Measured Respiratory Rate (breaths/min): 16 Weight (lbs): 176 Blood Pressure (mmHg): 112/62 Source: Measured Reference Range: 80 - 120 mg / dl Body Mass Index (BMI): 31.2 Electronic Signature(s) Signed: 06/14/2017 11:38:18 AM By: [name], BSN, RN, CWS, Kim RN, BSN Entered By: [name], BSN, RN, CWS, [name] on 06/13/2017 13:20:53

## 2017-06-14 NOTE — Progress Notes (Signed)
[name], Doralene G. ([id number]) Visit Report for 06/13/2017 Abuse/Suicide Risk Screen Details Patient Name: [name], Tatia G. Date of Service: 06/13/2017 1:15 PM Medical Record Number: [id number] Patient Account Number: [id number] Date of Birth/Sex: [birth year] (71 y.o. Female) Treating RN: Huel CoventryWoody, Kim Primary Care Marge Vandermeulen: Quintin AltoBURDINE, STEVEN Other Clinician: Referring Jacqueline Spofford: Nanetta BattyBERRY, JONATHAN Treating Absalom Aro/Extender: Rudene ReBritto, Errol Weeks in Treatment: 0 Abuse/Suicide Risk Screen Items Answer ABUSE/SUICIDE RISK SCREEN: Has anyone close to you tried to hurt or harm you recentlyo No Do you feel uncomfortable with anyone in your familyo No Has anyone forced you do things that you didnot want to doo No Do you have any thoughts of harming yourselfo No Patient displays signs or symptoms of abuse and/or neglect. No Electronic Signature(s) Signed: 06/14/2017 11:38:18 AM By: Elliot GurneyWoody, BSN, RN, CWS, Kim RN, BSN Entered By: Elliot GurneyWoody, BSN, RN, CWS, Kim on 06/13/2017 13:33:55 Rumsey, Don PerkingMARY G. ([id number]) -------------------------------------------------------------------------------- Activities of Daily Living Details Patient Name: [name] LoaEFALCO, Lita G. Date of Service: 06/13/2017 1:15 PM Medical Record Number: [id number] Patient Account Number: [id number] Date of Birth/Sex: [birth year] (71 y.o. Female) Treating RN: Huel CoventryWoody, Kim Primary Care Kura Bethards: Quintin AltoBURDINE, STEVEN Other Clinician: Referring Jailen Coward: Nanetta BattyBERRY, JONATHAN Treating Antrice Pal/Extender: Rudene ReBritto, Errol Weeks in Treatment: 0 Activities of Daily Living Items Answer Activities of Daily Living (Please select one for each item) Drive Automobile Not Able Take Medications Completely Able Use Telephone Completely Able Care for Appearance Completely Able Use Toilet Completely Able Bath / Shower Completely Able Dress Self Completely Able Feed Self Completely Able Walk Completely Able Get In / Out Bed Completely Able Housework Completely Able Prepare  Meals Completely Able Handle Money Completely Able Shop for Self Need Assistance Electronic Signature(s) Signed: 06/14/2017 11:38:18 AM By: Elliot GurneyWoody, BSN, RN, CWS, Kim RN, BSN Entered By: Elliot GurneyWoody, BSN, RN, CWS, Kim on 06/13/2017 13:34:25 Schneiderman, Don PerkingMARY G. ([id number]) -------------------------------------------------------------------------------- Education Assessment Details Patient Name: [name] LoaEFALCO, Marleigh G. Date of Service: 06/13/2017 1:15 PM Medical Record Number: [id number] Patient Account Number: [id number] Date of Birth/Sex: [birth year] (71 y.o. Female) Treating RN: Huel CoventryWoody, Kim Primary Care Dhairya Corales: Quintin AltoBURDINE, STEVEN Other Clinician: Referring Nekesha Font: Nanetta BattyBERRY, JONATHAN Treating Zanyia Silbaugh/Extender: Rudene ReBritto, Errol Weeks in Treatment: 0 Primary Learner Assessed: Patient Learning Preferences/Education Level/Primary Language Learning Preference: Explanation, Demonstration Highest Education Level: College or Above Preferred Language: English Cognitive Barrier Assessment/Beliefs Language Barrier: No Translator Needed: No Memory Deficit: No Emotional Barrier: No Cultural/Religious Beliefs Affecting Medical Care: No Physical Barrier Assessment Impaired Vision: No Impaired Hearing: No Decreased Hand dexterity: No Knowledge/Comprehension Assessment Knowledge Level: Medium Comprehension Level: Medium Ability to understand written Medium instructions: Ability to understand verbal Medium instructions: Motivation Assessment Anxiety Level: Calm Cooperation: Cooperative Education Importance: Acknowledges Need Interest in Health Problems: Asks Questions Perception: Coherent Willingness to Engage in Self- Medium Management Activities: Readiness to Engage in Self- Medium Management Activities: Electronic Signature(s) Signed: 06/14/2017 11:38:18 AM By: Elliot GurneyWoody, BSN, RN, CWS, Kim RN, BSN Entered By: Elliot GurneyWoody, BSN, RN, CWS, Kim on 06/13/2017 13:35:41 Bolda, Don PerkingMARY G.  ([id number]) -------------------------------------------------------------------------------- Fall Risk Assessment Details Patient Name: [name] LoaEFALCO, Yessenia G. Date of Service: 06/13/2017 1:15 PM Medical Record Number: [id number] Patient Account Number: [id number] Date of Birth/Sex: [birth year] (71 y.o. Female) Treating RN: Huel CoventryWoody, Kim Primary Care Evalee Gerard: Quintin AltoBURDINE, STEVEN Other Clinician: Referring Esdras Delair: Nanetta BattyBERRY, JONATHAN Treating Ayron Fillinger/Extender: Rudene ReBritto, Errol Weeks in Treatment: 0 Fall Risk Assessment Items Have you had 2 or more falls in the last 12 monthso 0 No Have you had any fall that resulted in injury in the last 12 monthso 0 No FALL RISK ASSESSMENT: History of falling - immediate  or within 3 months 25 Yes Secondary diagnosis 0 No Ambulatory aid None/bed rest/wheelchair/nurse 0 Yes Crutches/cane/walker 0 No Furniture 0 No IV Access/Saline Lock 0 No Gait/Training Normal/bed rest/immobile 0 No Weak 10 Yes Impaired 0 No Mental Status Oriented to own ability 0 Yes Electronic Signature(s) Signed: 06/14/2017 11:38:18 AM By: Elliot GurneyWoody, BSN, RN, CWS, Kim RN, BSN Entered By: Elliot GurneyWoody, BSN, RN, CWS, Kim on 06/13/2017 13:35:58 Gwinn, Don PerkingMARY G. ([id number]) -------------------------------------------------------------------------------- Foot Assessment Details Patient Name: [name] LoaEFALCO, Geralda G. Date of Service: 06/13/2017 1:15 PM Medical Record Number: [id number] Patient Account Number: [id number] Date of Birth/Sex: 01-28-1946 (71 y.o. Female) Treating RN: Huel CoventryWoody, Kim Primary Care Jasiyah Paulding: Quintin AltoBURDINE, STEVEN Other Clinician: Referring Orena Cavazos: Nanetta BattyBERRY, JONATHAN Treating Oretha Weismann/Extender: Rudene ReBritto, Errol Weeks in Treatment: 0 Foot Assessment Items Site Locations + = Sensation present, - = Sensation absent, C = Callus, U = Ulcer R = Redness, W = Warmth, M = Maceration, PU = Pre-ulcerative lesion F = Fissure, S = Swelling, D = Dryness Assessment Right: Left: Other Deformity: No No Prior Foot  Ulcer: No No Prior Amputation: No No Charcot Joint: No No Ambulatory Status: Ambulatory With Help Assistance Device: Wheelchair Gait: Steady Electronic Signature(s) Signed: 06/14/2017 11:38:18 AM By: Elliot GurneyWoody, BSN, RN, CWS, Kim RN, BSN Entered By: Elliot GurneyWoody, BSN, RN, CWS, Kim on 06/13/2017 13:54:18 Stolze, Don PerkingMARY G. ([id number]) -------------------------------------------------------------------------------- Nutrition Risk Assessment Details Patient Name: [name] LoaEFALCO, Anel G. Date of Service: 06/13/2017 1:15 PM Medical Record Number: [id number] Patient Account Number: [id number] Date of Birth/Sex: 01-28-1946 (71 y.o. Female) Treating RN: Huel CoventryWoody, Kim Primary Care Jatia Musa: Quintin AltoBURDINE, STEVEN Other Clinician: Referring Deajah Erkkila: Nanetta BattyBERRY, JONATHAN Treating Ronita Hargreaves/Extender: Rudene ReBritto, Errol Weeks in Treatment: 0 Height (in): 63 Weight (lbs): 176 Body Mass Index (BMI): 31.2 Nutrition Risk Assessment Items NUTRITION RISK SCREEN: I have an illness or condition that made me change the kind and/or amount of 0 No food I eat I eat fewer than two meals per day 0 No I eat few fruits and vegetables, or milk products 0 No I have three or more drinks of beer, liquor or wine almost every day 0 No I have tooth or mouth problems that make it hard for me to eat 0 No I don't always have enough money to buy the food I need 0 No I eat alone most of the time 0 No I take three or more different prescribed or over-the-counter drugs a day 1 Yes Without wanting to, I have lost or gained 10 pounds in the last six months 0 No I am not always physically able to shop, cook and/or feed myself 0 No Nutrition Protocols Good Risk Protocol Moderate Risk Protocol Electronic Signature(s) Signed: 06/14/2017 11:38:18 AM By: Elliot GurneyWoody, BSN, RN, CWS, Kim RN, BSN Entered By: Elliot GurneyWoody, BSN, RN, CWS, Kim on 06/13/2017 13:36:06

## 2017-06-15 ENCOUNTER — Ambulatory Visit (HOSPITAL_COMMUNITY)
Admission: RE | Admit: 2017-06-15 | Discharge: 2017-06-15 | Disposition: A | Discharge status: Home / Self Care | Payer: Medicare Other | Source: Ambulatory Visit | Attending: Cardiology | Admitting: Cardiology

## 2017-06-15 ENCOUNTER — Encounter (HOSPITAL_COMMUNITY): Payer: Self-pay

## 2017-06-15 VITALS — BP 128/82 | HR 93 | Wt 176.4 lb

## 2017-06-15 DIAGNOSIS — K219 Gastro-esophageal reflux disease without esophagitis: Secondary | ICD-10-CM | POA: Insufficient documentation

## 2017-06-15 DIAGNOSIS — Z7902 Long term (current) use of antithrombotics/antiplatelets: Secondary | ICD-10-CM | POA: Insufficient documentation

## 2017-06-15 DIAGNOSIS — I255 Ischemic cardiomyopathy: Secondary | ICD-10-CM | POA: Diagnosis not present

## 2017-06-15 DIAGNOSIS — E039 Hypothyroidism, unspecified: Secondary | ICD-10-CM | POA: Diagnosis not present

## 2017-06-15 DIAGNOSIS — Z951 Presence of aortocoronary bypass graft: Secondary | ICD-10-CM | POA: Insufficient documentation

## 2017-06-15 DIAGNOSIS — I998 Other disorder of circulatory system: Secondary | ICD-10-CM

## 2017-06-15 DIAGNOSIS — N289 Disorder of kidney and ureter, unspecified: Secondary | ICD-10-CM | POA: Insufficient documentation

## 2017-06-15 DIAGNOSIS — E1151 Type 2 diabetes mellitus with diabetic peripheral angiopathy without gangrene: Secondary | ICD-10-CM | POA: Insufficient documentation

## 2017-06-15 DIAGNOSIS — Z79890 Hormone replacement therapy (postmenopausal): Secondary | ICD-10-CM | POA: Insufficient documentation

## 2017-06-15 DIAGNOSIS — Z823 Family history of stroke: Secondary | ICD-10-CM | POA: Insufficient documentation

## 2017-06-15 DIAGNOSIS — Z87891 Personal history of nicotine dependence: Secondary | ICD-10-CM | POA: Diagnosis not present

## 2017-06-15 DIAGNOSIS — Z95828 Presence of other vascular implants and grafts: Secondary | ICD-10-CM | POA: Insufficient documentation

## 2017-06-15 DIAGNOSIS — E785 Hyperlipidemia, unspecified: Secondary | ICD-10-CM | POA: Insufficient documentation

## 2017-06-15 DIAGNOSIS — E669 Obesity, unspecified: Secondary | ICD-10-CM | POA: Insufficient documentation

## 2017-06-15 DIAGNOSIS — I480 Paroxysmal atrial fibrillation: Secondary | ICD-10-CM | POA: Insufficient documentation

## 2017-06-15 DIAGNOSIS — Z885 Allergy status to narcotic agent status: Secondary | ICD-10-CM | POA: Insufficient documentation

## 2017-06-15 DIAGNOSIS — Z8249 Family history of ischemic heart disease and other diseases of the circulatory system: Secondary | ICD-10-CM | POA: Insufficient documentation

## 2017-06-15 DIAGNOSIS — J45909 Unspecified asthma, uncomplicated: Secondary | ICD-10-CM | POA: Diagnosis not present

## 2017-06-15 DIAGNOSIS — Z6831 Body mass index (BMI) 31.0-31.9, adult: Secondary | ICD-10-CM | POA: Insufficient documentation

## 2017-06-15 DIAGNOSIS — I252 Old myocardial infarction: Secondary | ICD-10-CM | POA: Diagnosis not present

## 2017-06-15 DIAGNOSIS — Z79899 Other long term (current) drug therapy: Secondary | ICD-10-CM | POA: Insufficient documentation

## 2017-06-15 DIAGNOSIS — I5042 Chronic combined systolic (congestive) and diastolic (congestive) heart failure: Secondary | ICD-10-CM | POA: Diagnosis not present

## 2017-06-15 DIAGNOSIS — Z789 Other specified health status: Secondary | ICD-10-CM | POA: Diagnosis not present

## 2017-06-15 DIAGNOSIS — Z7982 Long term (current) use of aspirin: Secondary | ICD-10-CM | POA: Insufficient documentation

## 2017-06-15 DIAGNOSIS — Z888 Allergy status to other drugs, medicaments and biological substances status: Secondary | ICD-10-CM | POA: Insufficient documentation

## 2017-06-15 DIAGNOSIS — I2510 Atherosclerotic heart disease of native coronary artery without angina pectoris: Secondary | ICD-10-CM | POA: Insufficient documentation

## 2017-06-15 DIAGNOSIS — I110 Hypertensive heart disease with heart failure: Secondary | ICD-10-CM | POA: Insufficient documentation

## 2017-06-15 DIAGNOSIS — I70229 Atherosclerosis of native arteries of extremities with rest pain, unspecified extremity: Secondary | ICD-10-CM

## 2017-06-15 DIAGNOSIS — Z86010 Personal history of colonic polyps: Secondary | ICD-10-CM | POA: Insufficient documentation

## 2017-06-15 DIAGNOSIS — Z7901 Long term (current) use of anticoagulants: Secondary | ICD-10-CM | POA: Insufficient documentation

## 2017-06-15 LAB — BASIC METABOLIC PANEL
Anion gap: 11 (ref 5–15)
BUN: 17 mg/dL (ref 6–20)
CHLORIDE: 95 mmol/L — AB (ref 101–111)
CO2: 24 mmol/L (ref 22–32)
CREATININE: 1.12 mg/dL — AB (ref 0.44–1.00)
Calcium: 9.2 mg/dL (ref 8.9–10.3)
GFR calc non Af Amer: 49 mL/min — ABNORMAL LOW (ref >60)
GFR, EST AFRICAN AMERICAN: 56 mL/min — AB (ref >60)
Glucose, Bld: 130 mg/dL — ABNORMAL HIGH (ref 65–99)
POTASSIUM: 4.3 mmol/L (ref 3.5–5.1)
Sodium: 130 mmol/L — ABNORMAL LOW (ref 135–145)

## 2017-06-15 MED ORDER — TORSEMIDE 20 MG PO TABS: ORAL_TABLET | ORAL | 6 refills | Status: DC

## 2017-06-15 MED ORDER — CARVEDILOL 6.25 MG PO TABS
6.2500 mg | ORAL_TABLET | Freq: Two times a day (BID) | ORAL | 3 refills | Status: AC
Start: 2017-06-15 — End: ?

## 2017-06-15 NOTE — Progress Notes (Signed)
Advanced Heart Failure Clinic Note   Primary Care: Juliette Alcide, MD Primary Cardiologist: Dr. Diona Browner  HPI: [name] is a 71 y.o. female with a hx of Chronic dyspnea on exertion, CAD, multivessel status post coronary artery bypass grafting in 2003, chronic combined systolic and diastolic heart failure, hypertension, history of atrial flutter with cardioversion in 2016, hyperlipidemia, hypothyroidism, and ischemic cardiomyopathy.  She presented to the ED on 04/07/17 with complaints of chest pain and back pain described as stabbing pain in the left scapula, reminiscent of pain she experienced prior to MI in the past. No associated diaphoresis. The patient was recently seen by Dr. Diona Browner on 03/28/2017 with symptoms of dyspnea and discomfort, was ordered a [employer]. The test was found to be high risk with a large anterior apical and lateral wall infarct. EKG revealed atrial fibrillation with T-wave flattening in the lateral, and inferior leads, heart rate of 87 bpm. Essentially unchanged from prior EKG in July 2018. She underwent LHC that showed severe 3 vessel CAD, 1/3 patent grafts. There were no good targets for PCI. It was felt that her atrial fibrillation was contributing to her low EF. She was started on Amiodarone and underwent DCCV with successful return to NSR. Echo showed EF 20-25%, severely HK RV. Discharge weight was 175 pounds.   Admitted with [facility] critical limb ischemia. Underwent DES R popliteal artery.  Today she returns for post hospital follow up. Main complaint today is regarding 3 toes on right foot. Now followed [facility]. Denies SOB/PND/Orthopnea. No chest pain. Not walking much due to foot pain. Taking all medications.    Past Medical History:  Diagnosis Date  . Asthma   . CAD (coronary artery disease)    Multivessel status post CABG in 2003  . Chronic combined systolic (congestive) and diastolic (congestive) heart failure (HCC) 12/25/2016  .  Chronic systolic heart failure (HCC)   . Essential hypertension   . GERD (gastroesophageal reflux disease)   . Gum disease   . History of atrial flutter    Cardioversion 2016 in [place]  . History of colonic polyps   . History of goiter   . Hyperlipidemia   . Hypothyroidism   . Ischemic cardiomyopathy 12/25/2016  . Myocardial infarction (HCC)    2003  . Peripheral arterial disease (HCC)    Stent revascularization 2015 - details not clear  . Renal insufficiency   . Type 2 diabetes mellitus (HCC)     Current Outpatient Medications  Medication Sig Dispense Refill  . acetaminophen (TYLENOL) 500 MG tablet Take 1,000 mg by mouth daily as needed for moderate pain.     Marland Kitchen albuterol (PROVENTIL HFA;VENTOLIN HFA) 108 (90 Base) MCG/ACT inhaler Inhale 2 puffs into the lungs every 6 (six) hours as needed for wheezing or shortness of breath.     Marland Kitchen amiodarone (PACERONE) 200 MG tablet Take 1 tablet (200 mg total) daily by mouth. 90 tablet 3  . apixaban (ELIQUIS) 5 MG TABS tablet Take 1 tablet (5 mg total) by mouth 2 (two) times daily. 60 tablet 8  . aspirin 81 MG EC tablet Take 1 tablet (81 mg total) by mouth daily.    . carvedilol (COREG) 3.125 MG tablet Take 3.125 mg by mouth 2 (two) times daily.    . cephALEXin (KEFLEX) 500 MG capsule Take 1 capsule (500 mg total) by mouth 2 (two) times daily. 20 capsule 0  . clopidogrel (PLAVIX) 75 MG tablet Take 1 tablet (75 mg total)  by mouth daily. 30 tablet 11  . diphenhydramine-acetaminophen (TYLENOL PM) 25-500 MG TABS tablet Take 2 tablets by mouth at bedtime as needed (sleep).    Marland Kitchen. HYDROcodone-acetaminophen (NORCO) 5-325 MG tablet Take 1 tablet by mouth every 4 (four) hours as needed. 10 tablet 0  . HYDROcodone-acetaminophen (NORCO) 7.5-325 MG tablet Take 1 tablet by mouth as directed.  0  . levothyroxine (SYNTHROID, LEVOTHROID) 175 MCG tablet Take 175 mcg by mouth daily before breakfast.    . losartan (COZAAR) 25 MG tablet Take 0.5 tablets (12.5 mg total)  by mouth daily. 30 tablet 6  . Magnesium 250 MG TABS Take 250 mg by mouth daily.     Marland Kitchen. neomycin-bacitracin-polymyxin (NEOSPORIN) ointment Apply 1 application as needed topically for wound care. apply to toe    . nitroGLYCERIN (NITRODUR - DOSED IN MG/24 HR) 0.2 mg/hr patch PLACE 1 PATCH ONTO THE SKIN DAILY AS NEEDED FOR CHEST PAINS 90 patch 3  . oxymetazoline (AFRIN) 0.05 % nasal spray Place 1 spray into both nostrils daily as needed.     . pantoprazole (PROTONIX) 40 MG tablet Take 1 tablet (40 mg total) by mouth daily. 30 tablet 3  . potassium chloride (K-DUR,KLOR-CON) 10 MEQ tablet Take 2 tablets (20 mEq total) by mouth 2 (two) times daily. 120 tablet 6  . torsemide (DEMADEX) 20 MG tablet Take 2 tablets (40 mg total) by mouth 2 (two) times daily. 120 tablet 6   No current facility-administered medications for this encounter.     Allergies  Allergen Reactions  . Oxycodone Other (See Comments)    hallucinations   . Pollen Extract Other (See Comments)    Sneezing, running nose.  . Ranexa [Ranolazine] Nausea Only    Headache and nausea  . Statins Other (See Comments)    Cramping, turns skin yellow      Social History   Socioeconomic History  . Marital status: Married    Spouse name: Not on file  . Number of children: Not on file  . Years of education: Not on file  . Highest education level: Not on file  Social Needs  . Financial resource strain: Not very hard  . Food insecurity - worry: Never true  . Food insecurity - inability: Never true  . Transportation needs - medical: No  . Transportation needs - non-medical: No  Occupational History  . Not on file  Tobacco Use  . Smoking status: Former Smoker    Packs/day: 1.00    Years: 21.00    Pack years: 21.00    Types: Cigarettes    Last attempt to quit: 1988    Years since quitting: 30.9  . Smokeless tobacco: Never Used  Substance and Sexual Activity  . Alcohol use: No  . Drug use: No  . Sexual activity: Not Currently     Partners: Male  Other Topics Concern  . Not on file  Social History Narrative  . Not on file      Family History  Problem Relation Age of Onset  . Stroke Mother   . Heart attack Father   . Breast cancer Maternal Aunt   . Breast cancer Paternal Aunt     Vitals:   06/15/17 1153  BP: 128/82  Pulse: 93  SpO2: 94%  Weight: 176 lb 6.4 oz (80 kg)     PHYSICAL EXAM: General:  Well appearing. No respiratory difficulty. Arrived in a wheel chair. Husband present.  HEENT: normal Neck: supple. JVP 5-6 . Carotids 2+ bilat;  no bruits. No lymphadenopathy or thyromegaly appreciated. Cor: PMI nondisplaced.Tachy, irregular rate & rhythm. No rubs, gallops or murmurs. Lungs: clear Abdomen: soft, nontender, nondistended. No hepatosplenomegaly. No bruits or masses. Good bowel sounds. Extremities: no cyanosis, clubbing, rash, R and LLE trace edema.  Neuro: alert & oriented x 3, cranial nerves grossly intact. moves all 4 extremities w/o difficulty. Affect pleasant.    ASSESSMENT & PLAN: 1. Chronic combined systolic and diastolic HF:  - TEE 04/12/17 LVEF 20-25%, Severely HK RV, Mod LAE, Severe RAE, Mild MR, Moderate TR, Trivial PI. Has previously refused ICD. EF previously 30-35%, NICM/ICM. Suspect that atrial fib/flutter has worsened CM. S/p DCCV in 03/2017.  -NYHA II but hard to assess as she is not moving around much.  Volume status stable.  Continue torsemide 40 mg BID.  - Continue Spiro 12.5 mg daily.  - Continue losartan 12.5 mg daily - Increase carvedilol 6.25 mg twice a day.   2. CAD- CABG in 2003. S/P LHC 10/18  1/3 grafts patent. No targets for intervention - Denies chest pain.  - - Total Cholesterol 153, HDL 31, LDL 98. TGs 122. Not on statin due to cramping. Declined Zetia.  - Continue ASA 81 mg daily.   3. PAF - [place]  - Successful DCCV 04/12/17.  - Regular pulse.  -Continue Eliquis for anticoagulation.   5. Hypothyroidsim -  Continue synthroid  6. Obesity:  -Body mass index is 31.25 kg/m.  7. Suspected OSA - Set up sleep study down the road.   8. PAD- S/P Angiogram with DES to R popliteal. On plavix, asa. No statin due to allergy.   BMET today.  Plans as above. Follow up in 1 month.    Greater than 50% of the (total minutes 25) visit spent in counseling/coordination of care regarding the above.  Tonye BecketAmy Clegg, NP 06/15/17

## 2017-06-15 NOTE — Patient Instructions (Signed)
INCREASE Coreg to 6.25 mg, one tab twice a day  You may take an additional 20 mg of Torsemide as needed for 3 lb weight gain in 24 hours  Labs today We will only contact you if something comes back abnormal or we need to make some changes. Otherwise no news is good news!  Your physician recommends that you schedule a follow-up appointment in: 4 weeks with Amy Clegg,NP-C  Do the following things EVERYDAY: 1) Weigh yourself in the morning before breakfast. Write it down and keep it in a log. 2) Take your medicines as prescribed 3) Eat low salt foods-Limit salt (sodium) to 2000 mg per day.  4) Stay as active as you can everyday 5) Limit all fluids for the day to less than 2 liters

## 2017-06-22 ENCOUNTER — Encounter: Payer: Medicare Other | Admitting: Internal Medicine

## 2017-06-22 DIAGNOSIS — I70235 Atherosclerosis of native arteries of right leg with ulceration of other part of foot: Secondary | ICD-10-CM | POA: Diagnosis not present

## 2017-06-22 NOTE — Progress Notes (Signed)
RAEVYN, SOKOL ([id number]) Visit Report for 06/22/2017 HPI Details Patient Name: [name], [name] Date of Service: 06/22/2017 10:15 AM Medical Record Number: [id number] Patient Account Number: [id number] Date of Birth/Sex: November 27, 1945 (71 y.o. Female) Treating RN: Renne Crigler Primary Care Provider: Quintin Alto Other Clinician: Referring Provider: Quintin Alto Treating Provider/Extender: Altamese [place] in Treatment: 1 History of Present Illness Location: right foot toes Quality: Patient reports experiencing a sharp pain to affected area(s). Severity: Patient states wound are getting worse. Duration: Patient has had the wound for < 2 weeks prior to presenting for treatment Timing: Pain in wound is constant (hurts all the time) Context: The wound appeared gradually over time Modifying Factors: Other treatment(s) tried include:recent interventional process to place a stent in her right lower extremity for critical limb ischemia Associated Signs and Symptoms: Patient reports having foul odor. HPI Description: 71 year old female who was referred by Dr. Nanetta Batty, who performed a endovascular procedure on 05/26/2017, for critical limb ischemia. Her angiogram showed a 99% below the knee popliteal stenosis which is stented with a 4 mm drug eluting stent. On 06/10/2017 he noted that her pulses are palpable and her Dopplers have normalized. He referred her to wound care for further advice and put her on Keflex 500 mg twice daily for 10 days. She has a past medical history of asthma, coronary artery disease, CHF, hyperlipidemia, ischemic cardiomyopathy, peripheral arterial disease and type 2 diabetes mellitus. She is status post recent abdominal aortogram with right lower extremity stenting, cholecystectomy, CABG, parathyroidectomy, thyroidectomy. She is a former smoker and quit in 1988. She had an ABI performed on 06/10/2017 with the right ABI within normal range and the  left ABI shows mild lower extremity arterial disease. The right ABI was 1.0 and biphasic and the left ABI was 0.93 and biphasic. Toe pressures were abnormal with the right being 0.27 and left being 0.31. the duplex study done showed a patent right popliteal artery stent without evidence of focal stenosis or obstruction. of note the patient started having signs of critical limb ischemia somewhere at the end of November and after an urgent workup was taken up for limb salvage with a successfully placed stent by Dr. Nanetta Batty, with good arterial perfusion postprocedure with normal ABIs noted on 06/10/2017. 06/22/17; patient was admitted to our clinic last week by Dr. Meyer Russel.she is a type II diabetic. She had undergone a right popliteal DES on 11/29 for critical limb ischemia including erythema and pain of her right forefoot. She states about 3 days later she started developing ulcerations in predominantly the right foot but also the left third toe. 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 This is never really healed rib. Repeat ABIs on 12/14 showed a right ABI of 1 and a left ABI of 0.93 with toe pressures be markedly abnormal as noted above and Dr. Marcie Bal notes. The patient is still having a lot of pain making it very difficult for her to function at home this is worse on the right greater than left foot she does describe severe lancinating pain. She does not ambulate all that much and she finds it intolerable to wear footwear Her history is also notable for A. fib, severe cardiomyopathy with an ejection fraction of 25%_30%. Last echocardiogram in June of this year. According to Epic she is on a combination of Eliquis plavix and aspirin. Electronic Signature(s) Signed: 06/22/2017 4:35:05 PM By: Baltazar Najjar MD Entered By: Leanord Hawking,  Michael on 06/22/2017 12:23:30 Denz, [name] ([id number]) Melena, [name]  ([id number]) -------------------------------------------------------------------------------- Physical Exam Details Patient Name: [name], [name] Date of Service: 06/22/2017 10:15 AM Medical Record Number: [id number] Patient Account Number: [id number] Date of Birth/Sex: March 02, 1946 (71 y.o. Female) Treating RN: Renne Crigler Primary Care Provider: Quintin Alto Other Clinician: Referring Provider: Quintin Alto Treating Provider/Extender: Maxwell Caul Weeks in Treatment: 1 Constitutional Sitting or standing Blood Pressure is within target range for patient.. Pulse regular and within target range for patient.Marland Kitchen Respirations regular, non-labored and within target range.. Temperature is normal and within the target range for the patient.Marland Kitchen appears in no distress. Eyes Conjunctivae clear. No discharge. Respiratory Respiratory effort is easy and symmetric bilaterally. Rate is normal at rest and on room air.. Cardiovascular Heart rhythm and rate regular, without murmur or gallop.JVP not elevated. Femoral arteries without bruits and pulses strong.. Rosales pedis pulses are palpable bilaterally. Gastrointestinal (GI) there are no masses nothing to suggest an AAA. No liver or spleen enlargement or tenderness.. Integumentary (Hair, Skin) there is no rash. Psychiatric No evidence of depression, anxiety, or agitation. Calm, cooperative, and communicative. Appropriate interactions and affect.. 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 There is also involvement of the fourth and fifth toe. All of this extremely tender and very difficult to examine. oShe has a large necrotic wound on the tip of her left third toe and also eschar on the medial aspect of the left first toe oHer for foot is cold on the right less so on the left. Electronic Signature(s) Signed: 06/22/2017  4:35:05 PM By: Baltazar Najjar MD Entered By: Baltazar Najjar on 06/22/2017 12:25:35 Cruise, [name] ([id number]) -------------------------------------------------------------------------------- Physician Orders Details Patient Name: [name] Date of Service: 06/22/2017 10:15 AM Medical Record Number: [id number] Patient Account Number: [id number] Date of Birth/Sex: 08-Sep-1945 (71 y.o. Female) Treating RN: Renne Crigler Primary Care Provider: Quintin Alto Other Clinician: Referring Provider: Quintin Alto Treating Provider/Extender: Altamese [place] in Treatment: 1 Verbal / Phone Orders: No Diagnosis Coding Wound Cleansing Wound #1 Right,Distal Foot o Clean wound with Normal Saline. o Cleanse wound with mild soap and water o May Shower, gently pat wound dry prior to applying new dressing. - Must dry well before applying dressing. Anesthetic (add to Medication List) Wound #1 Right,Distal Foot o Topical Lidocaine 4% cream applied to wound bed prior to debridement (In Clinic Only). Primary Wound Dressing Wound #1 Right,Distal Foot o Other: - Silvercell Ag; in between 2nd-5th toes. Secondary Dressing Wound #1 Right,Distal Foot o ABD and Kerlix/Conform Dressing Change Frequency Wound #1 Right,Distal Foot o Change dressing every day. Follow-up Appointments o Return Appointment in 1 week. Edema Control Wound #1 Right,Distal Foot o Elevate legs to the level of the heart and pump ankles as often as possible Home Health Wound #1 Right,Distal Foot o Initiate Home Health for Skilled Nursing - [facility] o Home Health Nurse may visit PRN to address patientos wound care needs. o FACE TO FACE ENCOUNTER: MEDICARE and MEDICAID PATIENTS: I certify that this patient is under my care and that I had a face-to-face encounter that meets the physician face-to-face encounter requirements with this patient on this date. The encounter with the patient was  in whole or in part for the following MEDICAL CONDITION: (primary reason for Home Healthcare) MEDICAL NECESSITY: I certify, that based on my findings, NURSING services  are a medically necessary home health service. HOME BOUND STATUS: I certify that my clinical findings support that this patient is homebound (i.e., Due to illness or injury, pt requires aid of supportive devices such as crutches, cane, wheelchairs, walkers, the use of special transportation or the assistance of another person to leave their place of residence. There is a normal inability to leave the home and doing so requires considerable and taxing effort. Other absences are for medical reasons / religious services and are infrequent or of short duration when for other reasons). Chimento, [name] ([id number]) o If current dressing causes regression in wound condition, may D/C ordered dressing product/s and apply Normal Saline Moist Dressing daily until next Wound Healing Center / Other MD appointment. Notify Wound Healing Center of regression in wound condition at [phone]. o Please direct any NON-WOUND related issues/requests for orders to patient's Primary Care Physician Electronic Signature(s) Signed: 06/22/2017 4:30:16 PM By: Renne Crigler Signed: 06/22/2017 4:35:05 PM By: Baltazar Najjar MD Entered By: Renne Crigler on 06/22/2017 11:57:31 Sohail, [name] ([id number]) -------------------------------------------------------------------------------- Problem List Details Patient Name: [name] Date of Service: 06/22/2017 10:15 AM Medical Record Number: [id number] Patient Account Number: [id number] Date of Birth/Sex: 17-Apr-1946 (71 y.o. 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 Female) Treating RN: Renne Crigler Primary Care Provider: Quintin Alto Other Clinician: Referring Provider: Quintin Alto Treating Provider/Extender: Altamese [place] in Treatment: 1 Subjective History of Present Illness (HPI) The following HPI elements were documented for the patient's wound: Location: right foot toes Quality: Patient reports experiencing a sharp pain to affected area(s). Severity: Patient states wound are getting worse. Duration: Patient has had the wound for < 2 weeks prior to presenting for treatment Timing: Pain in wound is constant (hurts all the time) Context: The wound appeared gradually over time Modifying Factors: Other treatment(s) tried include:recent interventional process to place a stent in her right lower extremity for critical limb ischemia Associated Signs and Symptoms: Patient reports having foul odor. 71 year old female who was referred by Dr. Nanetta Batty, who performed a endovascular procedure on 05/26/2017, for critical limb ischemia. Her angiogram showed a 99% below the knee popliteal stenosis which is stented with a 4 mm  drug eluting stent. On 06/10/2017 he noted that her pulses are palpable and her Dopplers have normalized. He referred her to wound care for further advice and put her on Keflex 500 mg twice daily for 10 days. She has a past medical history of asthma, coronary artery disease, CHF, hyperlipidemia, ischemic cardiomyopathy, peripheral arterial disease and type 2 diabetes mellitus. She is status post recent abdominal aortogram with right lower extremity stenting, cholecystectomy, CABG, parathyroidectomy, thyroidectomy. She is a former smoker and quit in 1988. She had an ABI performed  on 06/10/2017 with the right ABI within normal range and the left ABI shows mild lower extremity arterial disease. The right ABI was 1.0 and biphasic and the left ABI was 0.93 and biphasic. Toe pressures were abnormal with the right being 0.27 and left being 0.31. the duplex study done showed a patent right popliteal artery stent without evidence of focal stenosis or obstruction. of note the patient started having signs of critical limb ischemia somewhere at the end of November and after an urgent workup was taken up for limb salvage with a successfully placed stent by Dr. Nanetta Batty, with good arterial perfusion postprocedure with normal ABIs noted on 06/10/2017. 06/22/17; patient was admitted to our clinic last week by Dr. Meyer Russel.she is a type II diabetic. She had undergone a right popliteal DES on 11/29 for critical limb ischemia including erythema and pain of her right forefoot. She states about 3 days later she started developing ulcerations in predominantly the right foot but also the left third toe. 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 This is never really healed rib. Repeat ABIs on 12/14 showed a right ABI of 1 and a left ABI of 0.93 with toe pressures be markedly abnormal as noted above and Dr. Marcie Bal notes. The patient is still having a lot of pain making  it very difficult for her to function at home this is worse on the right greater than left foot she does describe severe lancinating pain. She does not ambulate all that much and she finds it intolerable to wear footwear Her history is also notable for A. fib, severe cardiomyopathy with an ejection fraction of 25%_30%. Last echocardiogram in June of this year. According to Epic she is on a combination of Eliquis plavix and aspirin. Harewood, Margit Reece Agar ([id number]) Objective Constitutional Sitting or standing Blood Pressure is within target range for patient.. Pulse regular and within target range for patient.Marland Kitchen Respirations regular, non-labored and within target range.. Temperature is normal and within the target range for the patient.Marland Kitchen appears in no distress. Vitals Time Taken: 10:35 AM, Height: 63 in, Weight: 176 lbs, BMI: 31.2, Temperature: 98.4 F, Pulse: 84 bpm, Respiratory Rate: 16 breaths/min, Blood Pressure: 107/64 mmHg. Eyes Conjunctivae clear. No discharge. Respiratory Respiratory effort is easy and symmetric bilaterally. Rate is normal at rest and on room air.. Cardiovascular Heart rhythm and rate regular, without murmur or gallop.JVP not elevated. Femoral arteries without bruits and pulses strong.. Rosales pedis pulses are palpable bilaterally. Gastrointestinal (GI) there are no masses nothing to suggest an AAA. No liver or spleen enlargement or tenderness.Marland Kitchen Psychiatric No evidence of depression, anxiety, or agitation. Calm, cooperative, and communicative. Appropriate interactions and affect.. General Notes: wound exam; the patient has dry gangrene on the plantar aspect of her right big toe as well as between the second and third toe and third and fourth toe. There is also involvement of the fourth and fifth toe. All of this extremely tender and very difficult to examine. She has a large necrotic wound on the tip of her left third toe and also eschar on the medial aspect of the  left first toe Her for foot is cold on the right less so on the left. Integumentary (Hair, Skin) there is no rash. Wound #1 status is Open. Original cause of wound was Gradually Appeared. The wound is located on the Right,Distal Foot. The wound measures 9.2cm length x 2.2cm width x 0.2cm depth; 15.896cm^2 area and 3.179cm^3 volume.  There is Fat Layer (Subcutaneous Tissue) Exposed exposed. There is no tunneling or undermining noted. There is a large amount of sanguinous drainage noted. The wound margin is indistinct and nonvisible. There is medium (34-66%) red granulation within the wound bed. There is a medium (34-66%) amount of necrotic tissue within the wound bed including Eschar and Adherent Slough. The periwound skin appearance exhibited: Callus, Excoriation, Induration, Scarring, Dry/Scaly, Maceration, Erythema. The periwound skin appearance did not exhibit: Crepitus, Rash, Atrophie Blanche, Cyanosis, Ecchymosis, Hemosiderin Staining, Mottled, Pallor, Rubor. The surrounding wound skin color is noted with erythema which is circumferential. Wound #2 status is Open. Original cause of wound was Pressure Injury. The wound is located on the Left Toe Third. The wound measures 1.6cm length x 1.2cm width x 0.1cm depth; 1.508cm^2 area and 0.151cm^3 volume. There is a none present amount of drainage noted. The wound margin is indistinct and nonvisible. There is no granulation within the wound bed. There is a large (67-100%) amount of necrotic tissue within the wound bed including Eschar. The periwound skin appearance exhibited: Callus. The periwound skin appearance did not exhibit: Crepitus, Excoriation, Induration, Rash, Scarring, Dry/Scaly, Maceration, Atrophie Blanche, Cyanosis, Ecchymosis, Hemosiderin Staining, Mottled, Pallor, Rubor, Erythema. Buford, Don PerkingMARY G. 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 Cleanse wound with mild soap and water May Shower, gently pat wound dry prior to applying new dressing. - Must dry well before applying dressing. Anesthetic (add to Medication List): Wound #1 Right,Distal Foot: Topical Lidocaine 4% cream applied to wound bed prior to debridement (In Clinic Only). Primary Wound Dressing: Wound #1 Right,Distal Foot: Other: - Silvercell Ag; in between 2nd-5th toes. Secondary Dressing: Wound #1 Right,Distal Foot: ABD and Kerlix/Conform Dressing Change Frequency: Wound #1 Right,Distal Foot: Change dressing every day. Follow-up Appointments: Return Appointment in 1 week. Edema Control: Wound #1 Right,Distal Foot: Elevate legs to the level of the heart and pump ankles as often as possible Home Health: Wound #1 Right,Distal Foot: [facility] may visit PRN to address patient s wound care needs. FACE TO FACE ENCOUNTER: MEDICARE and MEDICAID PATIENTS: I certify that this patient is under my care and that I had a face-to-face encounter that meets the physician face-to-face encounter requirements with this patient on this date. The encounter with the patient was in whole or in part for the following MEDICAL CONDITION: (primary reason for Home Healthcare) MEDICAL NECESSITY: I certify, that based on my findings, NURSING services are a medically necessary home health service. HOME BOUND STATUS: I certify that my clinical findings support that this patient is homebound (i.e., Due to illness or injury, pt requires aid of supportive devices such as crutches, cane, wheelchairs, walkers, the use of special transportation or the assistance  of another person to leave their place of residence. There is a normal inability to leave the home and doing so requires considerable and taxing effort. Other absences are for medical reasons / religious services and are infrequent or of short duration when for other reasons). If current dressing causes regression in wound condition, may D/C ordered dressing product/s and apply Normal Saline Moist Dressing daily until next Wound Healing Center /  Other MD appointment. Notify Wound Healing Center of regression in wound condition at [phone]. Please direct any NON-WOUND related issues/requests for orders to patient's Primary Care Physician [facility]Huckaba, Don PerkingMARY G. ([id number]) #1 this has the appearance of microemboli and I wonder if this came from either cardiac or large vessel source #2 according to the patient's wound started about 3 days after Dr. Benay SpiceBarry's procedure he went in the left groin to do the stent in the right popliteal. #3 I think we continue with basic wound care including cleaning between the toes although this is difficult, silver alginate and gauze. This will need to be changed daily #4 patient is going to her primary physician for painkiller which currently is hydrocodone, this doesn't really seem sufficient for this type of discomfort #5 the patient has palpable peripheral pulses on both feet yet the forefoot especially on the right is quite cool. I wonder if this is going to pull through Electronic Signature(s) Signed: 06/22/2017 4:35:05 PM By: Baltazar Najjarobson, Michael MD Entered By: Baltazar Najjarobson, Michael on 06/22/2017 12:27:37 Gasiorowski, Don PerkingMARY G. ([id number]) -------------------------------------------------------------------------------- SuperBill Details Patient Name: Jolyne LoaEFALCO, Shaylon G. Date of Service: 06/22/2017 Medical Record Number: [id number] Patient Account Number: [id number] Date of Birth/Sex: [birth year](71 y.o. 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 Atherosclerosis of nonbiological bypass graft(s) of the extremities with gangrene, right leg Facility Procedures CPT4 Code: [id number] Description: 99213 - WOUND CARE VISIT-LEV 3 EST PT Modifier: Quantity: 1 Physician Procedures CPT4 Code Description: [id number] 99214 - WC PHYS LEVEL 4 - EST PT ICD-10 Diagnosis Description I70.235 Atherosclerosis of native arteries of right leg with ulceration of L97.512 Non-pressure chronic ulcer of other part of right foot with fat la I70.661  Atherosclerosis of nonbiological bypass graft(s) of the extremitie Modifier: other part of yer exposed s with gangren Quantity: 1 foot e, right leg Electronic Signature(s) Signed: 06/22/2017 4:35:05 PM By: Baltazar Najjarobson, Michael MD Entered By: Baltazar Najjarobson, Michael on 06/22/2017 12:28:01

## 2017-06-25 NOTE — Progress Notes (Signed)
[name], [name] ([id number]) Visit Report for 06/22/2017 Arrival Information Details Patient Name: [name], [name] Date of Service: 06/22/2017 10:15 AM Medical Record Number: [id number] Patient Account Number: [id number] Date of Birth/Sex: [birth year] (71 y.o. Female) Treating RN: Renne CriglerFlinchum, Cheryl Primary Care Paulett Kaufhold: Quintin AltoBURDINE, STEVEN Other Clinician: Referring Traeh Milroy: Quintin AltoBURDINE, STEVEN Treating Broly Hatfield/Extender: Altamese CarolinaOBSON, MICHAEL G Weeks in Treatment: 1 Visit Information History Since Last Visit All ordered tests and consults were completed: No Patient Arrived: Ambulatory Added or deleted any medications: No Arrival Time: 10:34 Had a fall or experienced change in No Accompanied By: husband activities of daily living that may affect Transfer Assistance: None risk of falls: Patient Identification Verified: Yes Signs or symptoms of abuse/neglect since last visito No Secondary Verification Process Yes Hospitalized since last visit: No Completed: Pain Present Now: Yes Patient Requires Transmission-Based No Precautions: Patient Has Alerts: Yes Patient Alerts: Patient on Blood Thinner Prediabetic Eliquis Electronic Signature(s) Signed: 06/22/2017 4:30:16 PM By: Renne CriglerFlinchum, Cheryl Entered By: Renne CriglerFlinchum, Cheryl on 06/22/2017 10:34:40 Mckeel, Don PerkingMARY G. ([id number]) -------------------------------------------------------------------------------- Clinic Level of Care Assessment Details Patient Name: [name] LoaEFALCO, [name] Date of Service: 06/22/2017 10:15 AM Medical Record Number: [id number] Patient Account Number: [id number] Date of Birth/Sex: [birth year] (71 y.o. Female) Treating RN: Renne CriglerFlinchum, Cheryl Primary Care Vannary Greening: Quintin AltoBURDINE, STEVEN Other Clinician: Referring Cloie Wooden: Quintin AltoBURDINE, STEVEN Treating Orelia Brandstetter/Extender: Altamese CarolinaOBSON, MICHAEL G Weeks in Treatment: 1 Clinic Level of Care Assessment Items TOOL 4 Quantity Score []  - Use when only an EandM is performed on FOLLOW-UP visit  0 ASSESSMENTS - Nursing Assessment / Reassessment X - Reassessment of Co-morbidities (includes updates in patient status) 1 10 X- 1 5 Reassessment of Adherence to Treatment Plan ASSESSMENTS - Wound and Skin Assessment / Reassessment X - Simple Wound Assessment / Reassessment - one wound 1 5 []  - 0 Complex Wound Assessment / Reassessment - multiple wounds []  - 0 Dermatologic / Skin Assessment (not related to wound area) ASSESSMENTS - Focused Assessment []  - Circumferential Edema Measurements - multi extremities 0 []  - 0 Nutritional Assessment / Counseling / Intervention []  - 0 Lower Extremity Assessment (monofilament, tuning fork, pulses) []  - 0 Peripheral Arterial Disease Assessment (using hand held doppler) ASSESSMENTS - Ostomy and/or Continence Assessment and Care []  - Incontinence Assessment and Management 0 []  - 0 Ostomy Care Assessment and Management (repouching, etc.) PROCESS - Coordination of Care X - Simple Patient / Family Education for ongoing care 1 15 []  - 0 Complex (extensive) Patient / Family Education for ongoing care []  - 0 Staff obtains ChiropractorConsents, Records, Test Results / Process Orders []  - 0 Staff telephones HHA, Nursing Homes / Clarify orders / etc []  - 0 Routine Transfer to another Facility (non-emergent condition) []  - 0 Routine Hospital Admission (non-emergent condition) []  - 0 New Admissions / Manufacturing engineernsurance Authorizations / Ordering NPWT, Apligraf, etc. []  - 0 Emergency Hospital Admission (emergent condition) X- 1 10 Simple Discharge Coordination Grega, Domini G. ([id number]) []  - 0 Complex (extensive) Discharge Coordination PROCESS - Special Needs []  - Pediatric / Minor Patient Management 0 []  - 0 Isolation Patient Management []  - 0 Hearing / Language / Visual special needs []  - 0 Assessment of Community assistance (transportation, D/C planning, etc.) []  - 0 Additional assistance / Altered mentation []  - 0 Support Surface(s) Assessment (bed,  cushion, seat, etc.) INTERVENTIONS - Wound Cleansing / Measurement X - Simple Wound Cleansing - one wound 1 5 []  - 0 Complex Wound Cleansing - multiple wounds X- 1 5 Wound Imaging (photographs - any number of wounds) []  -  0 Wound Tracing (instead of photographs) X- 1 5 Simple Wound Measurement - one wound []  - 0 Complex Wound Measurement - multiple wounds INTERVENTIONS - Wound Dressings X - Small Wound Dressing one or multiple wounds 2 10 []  - 0 Medium Wound Dressing one or multiple wounds []  - 0 Large Wound Dressing one or multiple wounds []  - 0 Application of Medications - topical []  - 0 Application of Medications - injection INTERVENTIONS - Miscellaneous []  - External ear exam 0 []  - 0 Specimen Collection (cultures, biopsies, blood, body fluids, etc.) []  - 0 Specimen(s) / Culture(s) sent or taken to Lab for analysis []  - 0 Patient Transfer (multiple staff / Nurse, adultHoyer Lift / Similar devices) []  - 0 Simple Staple / Suture removal (25 or less) []  - 0 Complex Staple / Suture removal (26 or more) []  - 0 Hypo / Hyperglycemic Management (close monitor of Blood Glucose) []  - 0 Ankle / Brachial Index (ABI) - do not check if billed separately X- 1 5 Vital Signs Mahadeo, Elif G. ([id number]) Has the patient been seen at the hospital within the last three years: Yes Total Score: 85 Level Of Care: New/Established - Level 3 Electronic Signature(s) Signed: 06/22/2017 4:30:16 PM By: Renne CriglerFlinchum, Cheryl Entered By: Renne CriglerFlinchum, Cheryl on 06/22/2017 11:37:12 Dacey, Don PerkingMARY G. ([id number]) -------------------------------------------------------------------------------- Encounter Discharge Information Details Patient Name: [name] LoaEFALCO, [name] Date of Service: 06/22/2017 10:15 AM Medical Record Number: [id number] Patient Account Number: [id number] Date of Birth/Sex: 1945/09/04 (71 y.o. Female) Treating RN: Renne CriglerFlinchum, Cheryl Primary Care Miracle Criado: Quintin AltoBURDINE, STEVEN Other Clinician: Referring  Freman Lapage: Quintin AltoBURDINE, STEVEN Treating Makya Phillis/Extender: Altamese CarolinaOBSON, MICHAEL G Weeks in Treatment: 1 Encounter Discharge Information Items Schedule Follow-up Appointment: No Medication Reconciliation completed and No provided to Patient/Care Viveca Beckstrom: Provided on Clinical Summary of Care: 06/22/2017 Form Type Recipient Paper Patient MD Electronic Signature(s) Signed: 06/24/2017 2:37:55 PM By: Gwenlyn PerkingMoore, Shelia Entered By: Gwenlyn PerkingMoore, Shelia on 06/22/2017 11:36:44 Bunnell, Don PerkingMARY G. ([id number]) -------------------------------------------------------------------------------- Lower Extremity Assessment Details Patient Name: [name] LoaEFALCO, Meenakshi G. Date of Service: 06/22/2017 10:15 AM Medical Record Number: [id number] Patient Account Number: [id number] Date of Birth/Sex: 1945/09/04 (71 y.o. Female) Treating RN: Renne CriglerFlinchum, Cheryl Primary Care Ethan Clayburn: Quintin AltoBURDINE, STEVEN Other Clinician: Referring Milbert Bixler: Quintin AltoBURDINE, STEVEN Treating Delvecchio Madole/Extender: Altamese CarolinaOBSON, MICHAEL G Weeks in Treatment: 1 Edema Assessment Assessed: [Left: No] [Right: No] Edema: [Left: Yes] [Right: Yes] Vascular Assessment Claudication: Claudication Assessment [Left:Rest Pain] [Right:Rest Pain] Pulses: Dorsalis Pedis Palpable: [Left:Yes] [Right:Yes] Posterior Tibial Extremity colors, hair growth, and conditions: Extremity Color: [Left:Normal] [Right:Normal] Hair Growth on Extremity: [Left:Yes] [Right:Yes] Temperature of Extremity: [Left:Cool] [Right:Cool] Capillary Refill: [Left:< 3 seconds] [Right:< 3 seconds] Toe Nail Assessment Left: Right: Thick: Yes Yes Discolored: Yes Yes Deformed: Yes Yes Improper Length and Hygiene: Yes Yes Electronic Signature(s) Signed: 06/22/2017 4:30:16 PM By: Renne CriglerFlinchum, Cheryl Entered By: Renne CriglerFlinchum, Cheryl on 06/22/2017 11:02:24 Senn, Don PerkingMARY G. ([id number]) -------------------------------------------------------------------------------- Multi Wound Chart Details Patient Name: [name] LoaEFALCO, Colleene G. Date of  Service: 06/22/2017 10:15 AM Medical Record Number: [id number] Patient Account Number: [id number] Date of Birth/Sex: 1945/09/04 (71 y.o. Female) Treating RN: Renne CriglerFlinchum, Cheryl Primary Care Cherrie Franca: Quintin AltoBURDINE, STEVEN Other Clinician: Referring Damonte Frieson: Quintin AltoBURDINE, STEVEN Treating Wisam Siefring/Extender: Altamese CarolinaOBSON, MICHAEL G Weeks in Treatment: 1 Vital Signs Height(in): 63 Pulse(bpm): 84 Weight(lbs): 176 Blood Pressure(mmHg): 107/64 Body Mass Index(BMI): 31 Temperature(F): 98.4 Respiratory Rate 16 (breaths/min): Photos: [1:No Photos] [2:No Photos] [N/A:N/A] Wound Location: [1:Right Foot - Distal] [2:Left Toe Third] [N/A:N/A] Wounding Event: [1:Gradually Appeared] [2:Pressure Injury] [N/A:N/A] Primary Etiology: [1:Arterial Insufficiency Ulcer] [2:Pressure Ulcer] [N/A:N/A] Comorbid History: [1:Anemia, Arrhythmia, Congestive Heart Failure, Hypertension, Myocardial Infarction,  Peripheral Arterial Disease, Neuropathy] [2:Anemia, Arrhythmia, Congestive Heart Failure, Hypertension, Myocardial Infarction, Peripheral Arterial  Disease, Neuropathy] [N/A:N/A] Date Acquired: [1:05/30/2017] [2:02/21/2017] [N/A:N/A] Weeks of Treatment: [1:1] [2:0] [N/A:N/A] Wound Status: [1:Open] [2:Open] [N/A:N/A] Clustered Wound: [1:Yes] [2:No] [N/A:N/A] Clustered Quantity: [1:7] [2:N/A] [N/A:N/A] Pending Amputation on [1:Yes] [2:No] [N/A:N/A] Presentation: Measurements L x W x D [1:9.2x2.2x0.2] [2:1.6x1.2x0.1] [N/A:N/A] (cm) Area (cm) : [1:15.896] [2:1.508] [N/A:N/A] Volume (cm) : [1:3.179] [2:0.151] [N/A:N/A] % Reduction in Area: [1:36.80%] [2:N/A] [N/A:N/A] % Reduction in Volume: [1:-26.50%] [2:N/A] [N/A:N/A] Classification: [1:Full Thickness Without Exposed Support Structures] [2:Unstageable/Unclassified] [N/A:N/A] Exudate Amount: [1:Large] [2:None Present] [N/A:N/A] Exudate Type: [1:Sanguinous] [2:N/A] [N/A:N/A] Exudate Color: [1:red] [2:N/A] [N/A:N/A] Wound Margin: [1:Indistinct, nonvisible] [2:Indistinct,  nonvisible] [N/A:N/A] Granulation Amount: [1:Medium (34-66%)] [2:None Present (0%)] [N/A:N/A] Granulation Quality: [1:Red] [2:N/A] [N/A:N/A] Necrotic Amount: [1:Medium (34-66%)] [2:Large (67-100%)] [N/A:N/A] Necrotic Tissue: [1:Eschar, Adherent Slough] [2:Eschar] [N/A:N/A] Exposed Structures: [1:Fat Layer (Subcutaneous Tissue) Exposed: Yes Fascia: No Tendon: No Muscle: No] [2:Fascia: No Fat Layer (Subcutaneous Tissue) Exposed: No Tendon: No Muscle: No] [N/A:N/A] Joint: No Joint: No Bone: No Bone: No Epithelialization: None N/A N/A Periwound Skin Texture: Excoriation: Yes Callus: Yes N/A Induration: Yes Excoriation: No Callus: Yes Induration: No Scarring: Yes Crepitus: No Crepitus: No Rash: No Rash: No Scarring: No Periwound Skin Moisture: Maceration: Yes Maceration: No N/A Dry/Scaly: Yes Dry/Scaly: No Periwound Skin Color: Erythema: Yes Atrophie Blanche: No N/A Atrophie Blanche: No Cyanosis: No Cyanosis: No Ecchymosis: No Ecchymosis: No Erythema: No Hemosiderin Staining: No Hemosiderin Staining: No Mottled: No Mottled: No Pallor: No Pallor: No Rubor: No Rubor: No Erythema Location: Circumferential N/A N/A Tenderness on Palpation: No No N/A Wound Preparation: Ulcer Cleansing: Ulcer Cleansing: N/A Rinsed/Irrigated with Saline, Rinsed/Irrigated with Saline Wound Cleanser Topical Anesthetic Applied: Topical Anesthetic Applied: Other: lidocaine 4% Other: lidocaine 4% Treatment Notes Electronic Signature(s) Signed: 06/22/2017 4:35:05 PM By: Baltazar Najjar MD Entered By: Baltazar Najjar on 06/22/2017 12:18:22 Masten, [name] ([id number]) -------------------------------------------------------------------------------- Multi-Disciplinary Care Plan Details Patient Name: [name] Loa Date of Service: 06/22/2017 10:15 AM Medical Record Number: [id number] Patient Account Number: [id number] Date of Birth/Sex: Jul 30, 1945 (71 y.o. Female) Treating RN:  Renne Crigler Primary Care Dillinger Aston: Quintin Alto Other Clinician: Referring Danna Sewell: Quintin Alto Treating Bretton Tandy/Extender: Altamese [place] in Treatment: 1 Active Inactive ` Abuse / Safety / Falls / Self Care Management Nursing Diagnoses: History of Falls Impaired home maintenance Potential for falls Goals: Patient will not experience any injury related to falls Date Initiated: 06/13/2017 Target Resolution Date: 07/14/2017 Goal Status: Active Patient will remain injury free related to falls Date Initiated: 06/13/2017 Target Resolution Date: 07/14/2017 Goal Status: Active Interventions: Assess fall risk on admission and as needed Treatment Activities: Patient referred to home care : 06/13/2017 Notes: ` Necrotic Tissue Nursing Diagnoses: Impaired tissue integrity related to necrotic/devitalized tissue Goals: Necrotic/devitalized tissue will be minimized in the wound bed Date Initiated: 06/13/2017 Target Resolution Date: 07/14/2017 Goal Status: Active Interventions: Assess patient pain level pre-, during and post procedure and prior to discharge Provide education on necrotic tissue and debridement process Treatment Activities: Apply topical anesthetic as ordered : 06/13/2017 Notes: Yarde, [name] ([id number]) ` Orientation to the Wound Care Program Nursing Diagnoses: Knowledge deficit related to the wound healing center program Goals: Patient/caregiver will verbalize understanding of the Wound Healing Center Program Date Initiated: 06/13/2017 Target Resolution Date: 07/14/2017 Goal Status: Active Interventions: Provide education on orientation to the wound center Notes: ` Wound/Skin Impairment Nursing Diagnoses: Impaired tissue integrity Knowledge deficit related to ulceration/compromised skin integrity Goals: Ulcer/skin breakdown will have  a volume reduction of 30% by week 4 Date Initiated: 06/13/2017 Target Resolution Date:  07/14/2017 Goal Status: Active Interventions: Assess ulceration(s) every visit Treatment Activities: Patient referred to home care : 06/13/2017 Topical wound management initiated : 06/13/2017 Notes: Electronic Signature(s) Signed: 06/22/2017 4:30:16 PM By: Renne Crigler Entered By: Renne Crigler on 06/22/2017 11:02:31 Fisch, [name] ([id number]) -------------------------------------------------------------------------------- Pain Assessment Details Patient Name: [name] Loa Date of Service: 06/22/2017 10:15 AM Medical Record Number: [id number] Patient Account Number: [id number] Date of Birth/Sex: 11-07-1945 (71 y.o. Female) Treating RN: Renne Crigler Primary Care Braydan Marriott: Quintin Alto Other Clinician: Referring Teshia Mahone: Quintin Alto Treating Alroy Portela/Extender: Altamese [place] in Treatment: 1 Active Problems Location of Pain Severity and Description of Pain Patient Has Paino Yes Site Locations Duration of the Pain. Constant / Intermittento Intermittent Rate the pain. Current Pain Level: 4 Character of Pain Describe the Pain: Aching, Shooting, Throbbing Pain Management and Medication Current Pain Management: Electronic Signature(s) Signed: 06/22/2017 4:30:16 PM By: Renne Crigler Entered By: Renne Crigler on 06/22/2017 10:35:42 Sitar, [name] ([id number]) -------------------------------------------------------------------------------- Wound Assessment Details Patient Name: [name] Loa Date of Service: 06/22/2017 10:15 AM Medical Record Number: [id number] Patient Account Number: [id number] Date of Birth/Sex: 25-Jun-1946 (71 y.o. Female) Treating RN: Renne Crigler Primary Care Dimonique Bourdeau: Quintin Alto Other Clinician: Referring Afnan Cadiente: Quintin Alto Treating Nikiya Starn/Extender: Maxwell Caul Weeks in Treatment: 1 Wound Status Wound Number: 1 Primary Arterial Insufficiency Ulcer Etiology: Wound Location: Right Foot -  Distal Wound Open Wounding Event: Gradually Appeared Status: Date Acquired: 05/30/2017 Comorbid Anemia, Arrhythmia, Congestive Heart Failure, Weeks Of Treatment: 1 History: Hypertension, Myocardial Infarction, Peripheral Clustered Wound: Yes Arterial Disease, Neuropathy Pending Amputation On Presentation Photos Wound Measurements Length: (cm) 9.2 Width: (cm) 2.2 Depth: (cm) 0.2 Clustered Quantity: 7 Area: (cm) 15.896 Volume: (cm) 3.179 % Reduction in Area: 36.8% % Reduction in Volume: -26.5% Epithelialization: None Tunneling: No Undermining: No Wound Description Full Thickness Without Exposed Support Foul Odo Classification: Structures Slough/F Wound Margin: Indistinct, nonvisible Exudate Large Amount: Exudate Type: Sanguinous Exudate Color: red r After Cleansing: No ibrino Yes Wound Bed Granulation Amount: Medium (34-66%) Exposed Structure Granulation Quality: Red Fascia Exposed: No Necrotic Amount: Medium (34-66%) Fat Layer (Subcutaneous Tissue) Exposed: Yes Necrotic Quality: Eschar, Adherent Slough Tendon Exposed: No Muscle Exposed: No Joint Exposed: No Carreker, Lamiyah G. ([id number]) Bone Exposed: No Periwound Skin Texture Texture Color No Abnormalities Noted: No No Abnormalities Noted: No Callus: Yes Atrophie Blanche: No Crepitus: No Cyanosis: No Excoriation: Yes Ecchymosis: No Induration: Yes Erythema: Yes Rash: No Erythema Location: Circumferential Scarring: Yes Hemosiderin Staining: No Mottled: No Moisture Pallor: No No Abnormalities Noted: No Rubor: No Dry / Scaly: Yes Maceration: Yes Wound Preparation Ulcer Cleansing: Rinsed/Irrigated with Saline, Wound Cleanser Topical Anesthetic Applied: Other: lidocaine 4%, Electronic Signature(s) Signed: 06/22/2017 1:19:31 PM By: Renne Crigler Entered By: Renne Crigler on 06/22/2017 13:19:31 Veneziano, [name]  ([id number]) -------------------------------------------------------------------------------- Wound Assessment Details Patient Name: [name] Loa Date of Service: 06/22/2017 10:15 AM Medical Record Number: [id number] Patient Account Number: [id number] Date of Birth/Sex: 1946-02-28 (71 y.o. Female) Treating RN: Renne Crigler Primary Care Anelly Samarin: Quintin Alto Other Clinician: Referring Emilina Smarr: Quintin Alto Treating [place] Shannahan/Extender: Altamese  in Treatment: 1 Wound Status Wound Number: 2 Primary Pressure Ulcer Etiology: Wound Location: Left Toe Third Wound Open Wounding Event: Pressure Injury Status: Date Acquired: 02/21/2017 Comorbid Anemia, Arrhythmia, Congestive Heart Failure, Weeks Of Treatment: 0 History: Hypertension, Myocardial Infarction, Peripheral Clustered Wound: No Arterial Disease, Neuropathy Wound Measurements Length: (cm) Width: (cm) Depth: (cm)  Area: (cm) Volume: (cm) 1.6 % Reduction in Area: 1.2 % Reduction in Volume: 0.1 1.508 0.151 Wound Description Classification: Unstageable/Unclassified Wound Margin: Indistinct, nonvisible Exudate Amount: None Present Foul Odor After Cleansing: No Slough/Fibrino Yes Wound Bed Granulation Amount: None Present (0%) Exposed Structure Necrotic Amount: Large (67-100%) Fascia Exposed: No Necrotic Quality: Eschar Fat Layer (Subcutaneous Tissue) Exposed: No Tendon Exposed: No Muscle Exposed: No Joint Exposed: No Bone Exposed: No Periwound Skin Texture Texture Color No Abnormalities Noted: No No Abnormalities Noted: No Callus: Yes Atrophie Blanche: No Crepitus: No Cyanosis: No Excoriation: No Ecchymosis: No Induration: No Erythema: No Rash: No Hemosiderin Staining: No Scarring: No Mottled: No Pallor: No Moisture Rubor: No No Abnormalities Noted: No Dry / Scaly: No Maceration: No Wound Preparation Stringfellow, Annalena G. ([id number]) Ulcer Cleansing: Rinsed/Irrigated with  Saline Topical Anesthetic Applied: Other: lidocaine 4%, Electronic Signature(s) Signed: 06/22/2017 4:30:16 PM By: Renne Crigler Entered By: Renne Crigler on 06/22/2017 10:46:43 Brecheisen, [name] ([id number]) -------------------------------------------------------------------------------- Vitals Details Patient Name: [name] Loa Date of Service: 06/22/2017 10:15 AM Medical Record Number: [id number] Patient Account Number: [id number] Date of Birth/Sex: [birth year] (70 y.o. Female) Treating RN: Renne Crigler Primary Care Lyrical Sowle: Quintin Alto Other Clinician: Referring Jebidiah Baggerly: Quintin Alto Treating Michail Boyte/Extender: Altamese [place] in Treatment: 1 Vital Signs Time Taken: 10:35 Temperature (F): 98.4 Height (in): 63 Pulse (bpm): 84 Weight (lbs): 176 Respiratory Rate (breaths/min): 16 Body Mass Index (BMI): 31.2 Blood Pressure (mmHg): 107/64 Reference Range: 80 - 120 mg / dl Electronic Signature(s) Signed: 06/22/2017 4:30:16 PM By: Renne Crigler Entered By: Renne Crigler on 06/22/2017 10:36:00

## 2017-06-29 ENCOUNTER — Encounter: Payer: Medicare Other | Attending: Internal Medicine | Admitting: Internal Medicine

## 2017-06-29 ENCOUNTER — Encounter: Payer: Medicare Other | Admitting: Vascular Surgery

## 2017-06-29 DIAGNOSIS — I70661 Atherosclerosis of nonbiological bypass graft(s) of the extremities with gangrene, right leg: Secondary | ICD-10-CM | POA: Diagnosis not present

## 2017-06-29 DIAGNOSIS — I2510 Atherosclerotic heart disease of native coronary artery without angina pectoris: Secondary | ICD-10-CM | POA: Insufficient documentation

## 2017-06-29 DIAGNOSIS — Z87891 Personal history of nicotine dependence: Secondary | ICD-10-CM | POA: Diagnosis not present

## 2017-06-29 DIAGNOSIS — L97512 Non-pressure chronic ulcer of other part of right foot with fat layer exposed: Secondary | ICD-10-CM | POA: Insufficient documentation

## 2017-06-29 DIAGNOSIS — I110 Hypertensive heart disease with heart failure: Secondary | ICD-10-CM | POA: Insufficient documentation

## 2017-06-29 DIAGNOSIS — E1152 Type 2 diabetes mellitus with diabetic peripheral angiopathy with gangrene: Secondary | ICD-10-CM | POA: Diagnosis not present

## 2017-06-29 DIAGNOSIS — I70235 Atherosclerosis of native arteries of right leg with ulceration of other part of foot: Secondary | ICD-10-CM | POA: Insufficient documentation

## 2017-06-29 DIAGNOSIS — J449 Chronic obstructive pulmonary disease, unspecified: Secondary | ICD-10-CM | POA: Diagnosis not present

## 2017-06-29 DIAGNOSIS — I255 Ischemic cardiomyopathy: Secondary | ICD-10-CM | POA: Diagnosis not present

## 2017-06-29 DIAGNOSIS — E785 Hyperlipidemia, unspecified: Secondary | ICD-10-CM | POA: Diagnosis not present

## 2017-06-29 DIAGNOSIS — I509 Heart failure, unspecified: Secondary | ICD-10-CM | POA: Insufficient documentation

## 2017-06-30 ENCOUNTER — Encounter: Payer: Self-pay | Admitting: Vascular Surgery

## 2017-06-30 ENCOUNTER — Ambulatory Visit: Payer: Medicare Other | Admitting: Vascular Surgery

## 2017-06-30 VITALS — BP 95/66 | HR 90 | Temp 98.2°F | Resp 16 | Ht 63.0 in | Wt 173.0 lb

## 2017-06-30 DIAGNOSIS — I7025 Atherosclerosis of native arteries of other extremities with ulceration: Secondary | ICD-10-CM | POA: Insufficient documentation

## 2017-06-30 MED ORDER — SULFAMETHOXAZOLE-TRIMETHOPRIM 800-160 MG PO TABS: 1.0000 | ORAL_TABLET | Freq: Two times a day (BID) | ORAL | Status: AC

## 2017-06-30 NOTE — H&P (View-Only) (Signed)
Requested by:  Juliette Alcide, MD [address] [place], [place] [address]  Reason for consultation: left foot gangrene    History of Present Illness   [name] is a 72 y.o. (1945-12-01) female with known PAD s/p recent R pop stenting with peroneal artery angioplasty with Dr. Allyson Sabal who presents with cc: left foot gangrene.  The patient's chronology is somewhat disjointed as she was upset during the interview.  She recalls after recent procedure developing worsening of wounds in her right foot.  She was referred to [facility] for management of the R foot.  The patient notes significant improvement in the R foot since development of her wounds.   In regards to her left foot, there is not mention of L foot wounds but the aptient notes she has finished a recent course of Keflex for the tip of her L 3rd toe, suggesting the L foot ulcer has been present previously.  The patient notes this side has progressed with more pain on this side at this time.  She denies any drainage or fever or chills.  Patient risk factors for atherosclerosis include: DM, HLD  Past Medical History:  Diagnosis Date  . Asthma   . CAD (coronary artery disease)    Multivessel status post CABG in 2003  . Chronic combined systolic (congestive) and diastolic (congestive) heart failure (HCC) 12/25/2016  . Chronic systolic heart failure (HCC)   . Essential hypertension   . GERD (gastroesophageal reflux disease)   . Gum disease   . History of atrial flutter    Cardioversion 2016 in [place]  . History of colonic polyps   . History of goiter   . Hyperlipidemia   . Hypothyroidism   . Ischemic cardiomyopathy 12/25/2016  . Myocardial infarction (HCC)    2003  . Peripheral arterial disease (HCC)    Stent revascularization 2015 - details not clear  . Renal insufficiency   . Type 2 diabetes mellitus (HCC)     Past Surgical History:  Procedure Laterality Date  . ABDOMINAL AORTOGRAM W/LOWER EXTREMITY Right 05/30/2017    Procedure: ABDOMINAL AORTOGRAM W/LOWER EXTREMITY;  Surgeon: Runell Gess, MD;  Location: [facility] INVASIVE CV LAB;  Service: Cardiovascular;  Laterality: Right;  . CARDIAC CATHETERIZATION    . CARDIOVERSION N/A 04/12/2017   Procedure: CARDIOVERSION;  Surgeon: Dolores Patty, MD;  Location: [facility] ENDOSCOPY;  Service: Cardiovascular;  Laterality: N/A;  . CARDIOVERSION N/A 05/02/2017   Procedure: CARDIOVERSION;  Surgeon: Dolores Patty, MD;  Location: [facility] ENDOSCOPY;  Service: Cardiovascular;  Laterality: N/A;  . CHOLECYSTECTOMY     2012  . CORONARY ARTERY BYPASS GRAFT     2003  . PARATHYROIDECTOMY     2015  . PERIPHERAL VASCULAR INTERVENTION Right 05/30/2017  . PERIPHERAL VASCULAR INTERVENTION Right 05/30/2017   Procedure: PERIPHERAL VASCULAR INTERVENTION;  Surgeon: Runell Gess, MD;  Location: [facility] INVASIVE CV LAB;  Service: Cardiovascular;  Laterality: Right;  . RIGHT/LEFT HEART CATH AND CORONARY/GRAFT ANGIOGRAPHY N/A 04/08/2017   Procedure: RIGHT/LEFT HEART CATH AND CORONARY/GRAFT ANGIOGRAPHY;  Surgeon: Corky Crafts, MD;  Location: [facility] INVASIVE CV LAB;  Service: Cardiovascular;  Laterality: N/A;  . TEE WITHOUT CARDIOVERSION N/A 04/12/2017   Procedure: TRANSESOPHAGEAL ECHOCARDIOGRAM (TEE);  Surgeon: Dolores Patty, MD;  Location: [facility] ENDOSCOPY;  Service: Cardiovascular;  Laterality: N/A;  . THYROIDECTOMY    . TONSILLECTOMY     1967  . ULTRASOUND GUIDANCE FOR VASCULAR ACCESS  04/08/2017   Procedure: Ultrasound Guidance For  Vascular Access;  Surgeon: Corky Crafts, MD;  Location: [facility] INVASIVE CV LAB;  Service: Cardiovascular;;     Social History   Socioeconomic History  . Marital status: Married    Spouse name: Not on file  . Number of children: Not on file  . Years of education: Not on file  . Highest education level: Not on file  Social Needs  . Financial resource strain: Not very hard  . Food insecurity - worry: Never true  . Food insecurity - inability:  Never true  . Transportation needs - medical: No  . Transportation needs - non-medical: No  Occupational History  . Not on file  Tobacco Use  . Smoking status: Former Smoker    Packs/day: 1.00    Years: 21.00    Pack years: 21.00    Types: Cigarettes    Last attempt to quit: 1988    Years since quitting: 31.0  . Smokeless tobacco: Never Used  Substance and Sexual Activity  . Alcohol use: No  . Drug use: No  . Sexual activity: Not Currently    Partners: Male  Other Topics Concern  . Not on file  Social History Narrative  . Not on file    Family History  Problem Relation Age of Onset  . Stroke Mother   . Heart attack Father   . Breast cancer Maternal Aunt   . Breast cancer Paternal Aunt     Current Outpatient Medications  Medication Sig Dispense Refill  . acetaminophen (TYLENOL) 500 MG tablet Take 1,000 mg by mouth daily as needed for moderate pain.     Marland Kitchen albuterol (PROVENTIL HFA;VENTOLIN HFA) 108 (90 Base) MCG/ACT inhaler Inhale 2 puffs into the lungs every 6 (six) hours as needed for wheezing or shortness of breath.     Marland Kitchen amiodarone (PACERONE) 200 MG tablet Take 1 tablet (200 mg total) daily by mouth. 90 tablet 3  . aspirin 81 MG EC tablet Take 1 tablet (81 mg total) by mouth daily.    . carvedilol (COREG) 6.25 MG tablet Take 1 tablet (6.25 mg total) by mouth 2 (two) times daily. 60 tablet 3  . cephALEXin (KEFLEX) 500 MG capsule Take 1 capsule (500 mg total) by mouth 2 (two) times daily. 20 capsule 0  . clopidogrel (PLAVIX) 75 MG tablet Take 1 tablet (75 mg total) by mouth daily. 30 tablet 11  . diphenhydramine-acetaminophen (TYLENOL PM) 25-500 MG TABS tablet Take 2 tablets by mouth at bedtime as needed (sleep).    Marland Kitchen HYDROcodone-acetaminophen (NORCO) 5-325 MG tablet Take 1 tablet by mouth every 4 (four) hours as needed. 10 tablet 0  . HYDROcodone-acetaminophen (NORCO) 7.5-325 MG tablet Take 1 tablet by mouth as directed.  0  . levothyroxine (SYNTHROID, LEVOTHROID) 175 MCG  tablet Take 175 mcg by mouth daily before breakfast.    . losartan (COZAAR) 25 MG tablet Take 0.5 tablets (12.5 mg total) by mouth daily. 30 tablet 6  . Magnesium 250 MG TABS Take 250 mg by mouth daily.     . nitroGLYCERIN (NITRODUR - DOSED IN MG/24 HR) 0.2 mg/hr patch PLACE 1 PATCH ONTO THE SKIN DAILY AS NEEDED FOR CHEST PAINS 90 patch 3  . oxymetazoline (AFRIN) 0.05 % nasal spray Place 1 spray into both nostrils daily as needed.     . pantoprazole (PROTONIX) 40 MG tablet Take 1 tablet (40 mg total) by mouth daily. 30 tablet 3  . potassium chloride (K-DUR,KLOR-CON) 10 MEQ tablet Take 2 tablets (20 mEq total)  by mouth 2 (two) times daily. 120 tablet 6  . torsemide (DEMADEX) 20 MG tablet Take 2 tablets (40 mg total) by mouth 2 (two) times daily. May also take 1 tablet (20 mg total) as needed (for 3lb weight gain in 24 hours). 150 tablet 6  . apixaban (ELIQUIS) 5 MG TABS tablet Take 1 tablet (5 mg total) by mouth 2 (two) times daily. 60 tablet 8  . neomycin-bacitracin-polymyxin (NEOSPORIN) ointment Apply 1 application as needed topically for wound care. apply to toe     Current Facility-Administered Medications  Medication Dose Route Frequency Provider Last Rate Last Dose  . sulfamethoxazole-trimethoprim (BACTRIM DS,SEPTRA DS) 800-160 MG per tablet 1 tablet  1 tablet Oral Q12H Fransisco Hertzhen, Laquiesha Piacente L, MD        Allergies  Allergen Reactions  . Oxycodone Other (See Comments)    hallucinations   . Pollen Extract Other (See Comments)    Sneezing, running nose.  . Ranexa [Ranolazine] Nausea Only    Headache and nausea  . Statins Other (See Comments)    Cramping, turns skin yellow    REVIEW OF SYSTEMS (negative unless checked):   Cardiac:  []  Chest pain or chest pressure? [x]  Shortness of breath upon activity? []  Shortness of breath when lying flat? []  Irregular heart rhythm?  Vascular:  [x]  Pain in calf, thigh, or hip brought on by walking? [x]  Pain in feet at night that wakes you up from your  sleep? []  Blood clot in your veins? []  Leg swelling?  Pulmonary:  []  Oxygen at home? []  Productive cough? []  Wheezing?  Neurologic:  []  Sudden weakness in arms or legs? []  Sudden numbness in arms or legs? []  Sudden onset of difficult speaking or slurred speech? []  Temporary loss of vision in one eye? []  Problems with dizziness?  Gastrointestinal:  []  Blood in stool? []  Vomited blood?  Genitourinary:  []  Burning when urinating? []  Blood in urine?  Psychiatric:  []  Major depression  Hematologic:  []  Bleeding problems? []  Problems with blood clotting?  Dermatologic:  []  Rashes or ulcers?  Constitutional:  []  Fever or chills?  Ear/Nose/Throat:  []  Change in hearing? []  Nose bleeds? []  Sore throat?  Musculoskeletal:  []  Back pain? []  Joint pain? []  Muscle pain?   For VQI Use Only   PRE-ADM LIVING Home  AMB STATUS Wheelchair  CAD Sx History of MI, but no symptoms No MI within 6 months  PRIOR CHF None  STRESS TEST MI    Physical Examination     Vitals:   06/30/17 1028  BP: 95/66  Pulse: 90  Resp: 16  Temp: 98.2 F (36.8 C)  TempSrc: Oral  SpO2: 99%  Weight: 173 lb (78.5 kg)  Height: 5\' 3"  (1.6 m)   Body mass index is 30.65 kg/m.  General Alert, O x 3, WD, NAD  Head [place]/AT,    Ear/Nose/ Throat Hearing grossly intact, nares without erythema or drainage, oropharynx without Erythema or Exudate, Mallampati score: 3,   Eyes PERRLA, EOMI,    Neck Supple, mid-line trachea,    Pulmonary Sym exp, good B air movt, CTA B  Cardiac RRR, Nl S1, S2, no Murmurs, No rubs, No S3,S4  Vascular Vessel Right Left  Radial Palpable Palpable  Brachial Palpable Palpable  Carotid Palpable, No Bruit Palpable, No Bruit  Aorta Not palpable N/A  Femoral Palpable Palpable  Popliteal Not palpable Not palpable  PT Not palpable Not palpable  DP Faintly palpable Not palpable    Gastro- intestinal soft,  non-distended, non-tender to palpation, No guarding or rebound, no  HSM, no masses, no CVAT B, No palpable prominent aortic pulse,    Musculo- skeletal M/S 5/5 throughout, R foot: multiple superficial patches of ischemic skin (most in process of peeling off with viable skin underneath), L foot: some eythema and edema in forefoot, 3rd toe with ischemic skin at distal tip with minimal purulent drainage, TTP; no TTP in mid-foot or forefoot  Neurologic Cranial nerves 2-12 intact , Pain and light touch intact in extremities , Motor exam as listed above  Psychiatric Judgement intact, Mood & affect appropriate for pt's clinical situation  Dermatologic See M/S exam for extremity exam, No rashes otherwise noted  Lymphatic  Palpable lymph nodes: None    Non-invasive Vascular Imaging   ABI (06/10/17)  R:   ABI: 1.0,   PT: bi  DP: bi  TBI:  0.31  L:   ABI: 0.8,   PT: bi  DP: bi  TBI: 0.31   Medical Decision Making   ANUHEA GASSNER is a 72 y.o. female who presents with: B foot ischemia, known BLE PAD, chronic kidney disease stage 2   I gave the patient rx for Augmentin DS 1 po BID x 7 days as pt already was given Keflex recently.    The problem with ABI in diabetic patients is medial calcifications tends to lead unfounded optimism of the status of the tibial arteries.    I don't have access to the study to review the waveforms myself, so it's impossible to determine if the waveforms were truly as read.  Given the presence of possible infected ischemic tissue in the L foot, I would recommend an aggressive stance.  Based on the patient's vascular studies and examination, I have offered the patient: aortogram, left leg runoff and possible intervention. I discussed with the patient the nature of angiographic procedures, especially the limited patencies of any endovascular intervention.   The patient is aware of that the risks of an angiographic procedure include but are not limited to: bleeding, infection, access site complications, renal failure,  embolization, rupture of vessel, dissection, arteriovenous fistula, possible need for emergent surgical intervention, possible need for surgical procedures to treat the patient's pathology, anaphylactic reaction to contrast, and stroke and death.    The patient want time to reconsider proceeding.  She is going to call us if she agrees to proceed.  I discussed in depth with the patient the nature of atherosclerosis, and emphasized the importance of maximal medical management including strict control of blood pressure, blood glucose, and lipid levels, obtaining regular exercise, antiplatelet agents, and cessation of smoking.   The patient is currently not on a statin due to reported allergy.  The patient is currently on an anti-platelet: ASA and Plavix.  The patient is aware that without maximal medical management the underlying atherosclerotic disease process will progress, limiting the benefit of any interventions.  Thank you for allowing [place] to participate in this patient's care.   Leonides Sake, MD, FACS [facility] Office: [phone] Pager: [phone]  06/30/2017, 11:30 AM

## 2017-06-30 NOTE — Progress Notes (Signed)
Requested by:  Juliette Alcide, MD [address] [place], [place] [address]  Reason for consultation: left foot gangrene    History of Present Illness   [name] is a 72 y.o. (1945-12-01) female with known PAD s/p recent R pop stenting with peroneal artery angioplasty with Dr. Allyson Sabal who presents with cc: left foot gangrene.  The patient's chronology is somewhat disjointed as she was upset during the interview.  She recalls after recent procedure developing worsening of wounds in her right foot.  She was referred to [facility] for management of the R foot.  The patient notes significant improvement in the R foot since development of her wounds.   In regards to her left foot, there is not mention of L foot wounds but the aptient notes she has finished a recent course of Keflex for the tip of her L 3rd toe, suggesting the L foot ulcer has been present previously.  The patient notes this side has progressed with more pain on this side at this time.  She denies any drainage or fever or chills.  Patient risk factors for atherosclerosis include: DM, HLD  Past Medical History:  Diagnosis Date  . Asthma   . CAD (coronary artery disease)    Multivessel status post CABG in 2003  . Chronic combined systolic (congestive) and diastolic (congestive) heart failure (HCC) 12/25/2016  . Chronic systolic heart failure (HCC)   . Essential hypertension   . GERD (gastroesophageal reflux disease)   . Gum disease   . History of atrial flutter    Cardioversion 2016 in [place]  . History of colonic polyps   . History of goiter   . Hyperlipidemia   . Hypothyroidism   . Ischemic cardiomyopathy 12/25/2016  . Myocardial infarction (HCC)    2003  . Peripheral arterial disease (HCC)    Stent revascularization 2015 - details not clear  . Renal insufficiency   . Type 2 diabetes mellitus (HCC)     Past Surgical History:  Procedure Laterality Date  . ABDOMINAL AORTOGRAM W/LOWER EXTREMITY Right 05/30/2017    Procedure: ABDOMINAL AORTOGRAM W/LOWER EXTREMITY;  Surgeon: Runell Gess, MD;  Location: [facility] INVASIVE CV LAB;  Service: Cardiovascular;  Laterality: Right;  . CARDIAC CATHETERIZATION    . CARDIOVERSION N/A 04/12/2017   Procedure: CARDIOVERSION;  Surgeon: Dolores Patty, MD;  Location: [facility] ENDOSCOPY;  Service: Cardiovascular;  Laterality: N/A;  . CARDIOVERSION N/A 05/02/2017   Procedure: CARDIOVERSION;  Surgeon: Dolores Patty, MD;  Location: [facility] ENDOSCOPY;  Service: Cardiovascular;  Laterality: N/A;  . CHOLECYSTECTOMY     2012  . CORONARY ARTERY BYPASS GRAFT     2003  . PARATHYROIDECTOMY     2015  . PERIPHERAL VASCULAR INTERVENTION Right 05/30/2017  . PERIPHERAL VASCULAR INTERVENTION Right 05/30/2017   Procedure: PERIPHERAL VASCULAR INTERVENTION;  Surgeon: Runell Gess, MD;  Location: [facility] INVASIVE CV LAB;  Service: Cardiovascular;  Laterality: Right;  . RIGHT/LEFT HEART CATH AND CORONARY/GRAFT ANGIOGRAPHY N/A 04/08/2017   Procedure: RIGHT/LEFT HEART CATH AND CORONARY/GRAFT ANGIOGRAPHY;  Surgeon: Corky Crafts, MD;  Location: [facility] INVASIVE CV LAB;  Service: Cardiovascular;  Laterality: N/A;  . TEE WITHOUT CARDIOVERSION N/A 04/12/2017   Procedure: TRANSESOPHAGEAL ECHOCARDIOGRAM (TEE);  Surgeon: Dolores Patty, MD;  Location: [facility] ENDOSCOPY;  Service: Cardiovascular;  Laterality: N/A;  . THYROIDECTOMY    . TONSILLECTOMY     1967  . ULTRASOUND GUIDANCE FOR VASCULAR ACCESS  04/08/2017   Procedure: Ultrasound Guidance For  Vascular Access;  Surgeon: Corky Crafts, MD;  Location: [facility] INVASIVE CV LAB;  Service: Cardiovascular;;     Social History   Socioeconomic History  . Marital status: Married    Spouse name: Not on file  . Number of children: Not on file  . Years of education: Not on file  . Highest education level: Not on file  Social Needs  . Financial resource strain: Not very hard  . Food insecurity - worry: Never true  . Food insecurity - inability:  Never true  . Transportation needs - medical: No  . Transportation needs - non-medical: No  Occupational History  . Not on file  Tobacco Use  . Smoking status: Former Smoker    Packs/day: 1.00    Years: 21.00    Pack years: 21.00    Types: Cigarettes    Last attempt to quit: 1988    Years since quitting: 31.0  . Smokeless tobacco: Never Used  Substance and Sexual Activity  . Alcohol use: No  . Drug use: No  . Sexual activity: Not Currently    Partners: Male  Other Topics Concern  . Not on file  Social History Narrative  . Not on file    Family History  Problem Relation Age of Onset  . Stroke Mother   . Heart attack Father   . Breast cancer Maternal Aunt   . Breast cancer Paternal Aunt     Current Outpatient Medications  Medication Sig Dispense Refill  . acetaminophen (TYLENOL) 500 MG tablet Take 1,000 mg by mouth daily as needed for moderate pain.     Marland Kitchen albuterol (PROVENTIL HFA;VENTOLIN HFA) 108 (90 Base) MCG/ACT inhaler Inhale 2 puffs into the lungs every 6 (six) hours as needed for wheezing or shortness of breath.     Marland Kitchen amiodarone (PACERONE) 200 MG tablet Take 1 tablet (200 mg total) daily by mouth. 90 tablet 3  . aspirin 81 MG EC tablet Take 1 tablet (81 mg total) by mouth daily.    . carvedilol (COREG) 6.25 MG tablet Take 1 tablet (6.25 mg total) by mouth 2 (two) times daily. 60 tablet 3  . cephALEXin (KEFLEX) 500 MG capsule Take 1 capsule (500 mg total) by mouth 2 (two) times daily. 20 capsule 0  . clopidogrel (PLAVIX) 75 MG tablet Take 1 tablet (75 mg total) by mouth daily. 30 tablet 11  . diphenhydramine-acetaminophen (TYLENOL PM) 25-500 MG TABS tablet Take 2 tablets by mouth at bedtime as needed (sleep).    Marland Kitchen HYDROcodone-acetaminophen (NORCO) 5-325 MG tablet Take 1 tablet by mouth every 4 (four) hours as needed. 10 tablet 0  . HYDROcodone-acetaminophen (NORCO) 7.5-325 MG tablet Take 1 tablet by mouth as directed.  0  . levothyroxine (SYNTHROID, LEVOTHROID) 175 MCG  tablet Take 175 mcg by mouth daily before breakfast.    . losartan (COZAAR) 25 MG tablet Take 0.5 tablets (12.5 mg total) by mouth daily. 30 tablet 6  . Magnesium 250 MG TABS Take 250 mg by mouth daily.     . nitroGLYCERIN (NITRODUR - DOSED IN MG/24 HR) 0.2 mg/hr patch PLACE 1 PATCH ONTO THE SKIN DAILY AS NEEDED FOR CHEST PAINS 90 patch 3  . oxymetazoline (AFRIN) 0.05 % nasal spray Place 1 spray into both nostrils daily as needed.     . pantoprazole (PROTONIX) 40 MG tablet Take 1 tablet (40 mg total) by mouth daily. 30 tablet 3  . potassium chloride (K-DUR,KLOR-CON) 10 MEQ tablet Take 2 tablets (20 mEq total)  by mouth 2 (two) times daily. 120 tablet 6  . torsemide (DEMADEX) 20 MG tablet Take 2 tablets (40 mg total) by mouth 2 (two) times daily. May also take 1 tablet (20 mg total) as needed (for 3lb weight gain in 24 hours). 150 tablet 6  . apixaban (ELIQUIS) 5 MG TABS tablet Take 1 tablet (5 mg total) by mouth 2 (two) times daily. 60 tablet 8  . neomycin-bacitracin-polymyxin (NEOSPORIN) ointment Apply 1 application as needed topically for wound care. apply to toe     Current Facility-Administered Medications  Medication Dose Route Frequency Provider Last Rate Last Dose  . sulfamethoxazole-trimethoprim (BACTRIM DS,SEPTRA DS) 800-160 MG per tablet 1 tablet  1 tablet Oral Q12H Fransisco Hertzhen, Brian L, MD        Allergies  Allergen Reactions  . Oxycodone Other (See Comments)    hallucinations   . Pollen Extract Other (See Comments)    Sneezing, running nose.  . Ranexa [Ranolazine] Nausea Only    Headache and nausea  . Statins Other (See Comments)    Cramping, turns skin yellow    REVIEW OF SYSTEMS (negative unless checked):   Cardiac:  []  Chest pain or chest pressure? [x]  Shortness of breath upon activity? []  Shortness of breath when lying flat? []  Irregular heart rhythm?  Vascular:  [x]  Pain in calf, thigh, or hip brought on by walking? [x]  Pain in feet at night that wakes you up from your  sleep? []  Blood clot in your veins? []  Leg swelling?  Pulmonary:  []  Oxygen at home? []  Productive cough? []  Wheezing?  Neurologic:  []  Sudden weakness in arms or legs? []  Sudden numbness in arms or legs? []  Sudden onset of difficult speaking or slurred speech? []  Temporary loss of vision in one eye? []  Problems with dizziness?  Gastrointestinal:  []  Blood in stool? []  Vomited blood?  Genitourinary:  []  Burning when urinating? []  Blood in urine?  Psychiatric:  []  Major depression  Hematologic:  []  Bleeding problems? []  Problems with blood clotting?  Dermatologic:  []  Rashes or ulcers?  Constitutional:  []  Fever or chills?  Ear/Nose/Throat:  []  Change in hearing? []  Nose bleeds? []  Sore throat?  Musculoskeletal:  []  Back pain? []  Joint pain? []  Muscle pain?   For VQI Use Only   PRE-ADM LIVING Home  AMB STATUS Wheelchair  CAD Sx History of MI, but no symptoms No MI within 6 months  PRIOR CHF None  STRESS TEST MI    Physical Examination     Vitals:   06/30/17 1028  BP: 95/66  Pulse: 90  Resp: 16  Temp: 98.2 F (36.8 C)  TempSrc: Oral  SpO2: 99%  Weight: 173 lb (78.5 kg)  Height: 5\' 3"  (1.6 m)   Body mass index is 30.65 kg/m.  General Alert, O x 3, WD, NAD  Head [place]/AT,    Ear/Nose/ Throat Hearing grossly intact, nares without erythema or drainage, oropharynx without Erythema or Exudate, Mallampati score: 3,   Eyes PERRLA, EOMI,    Neck Supple, mid-line trachea,    Pulmonary Sym exp, good B air movt, CTA B  Cardiac RRR, Nl S1, S2, no Murmurs, No rubs, No S3,S4  Vascular Vessel Right Left  Radial Palpable Palpable  Brachial Palpable Palpable  Carotid Palpable, No Bruit Palpable, No Bruit  Aorta Not palpable N/A  Femoral Palpable Palpable  Popliteal Not palpable Not palpable  PT Not palpable Not palpable  DP Faintly palpable Not palpable    Gastro- intestinal soft,  non-distended, non-tender to palpation, No guarding or rebound, no  HSM, no masses, no CVAT B, No palpable prominent aortic pulse,    Musculo- skeletal M/S 5/5 throughout, R foot: multiple superficial patches of ischemic skin (most in process of peeling off with viable skin underneath), L foot: some eythema and edema in forefoot, 3rd toe with ischemic skin at distal tip with minimal purulent drainage, TTP; no TTP in mid-foot or forefoot  Neurologic Cranial nerves 2-12 intact , Pain and light touch intact in extremities , Motor exam as listed above  Psychiatric Judgement intact, Mood & affect appropriate for pt's clinical situation  Dermatologic See M/S exam for extremity exam, No rashes otherwise noted  Lymphatic  Palpable lymph nodes: None    Non-invasive Vascular Imaging   ABI (06/10/17)  R:   ABI: 1.0,   PT: bi  DP: bi  TBI:  0.31  L:   ABI: 0.8,   PT: bi  DP: bi  TBI: 0.31   Medical Decision Making   [name] is a 72 y.o. female who presents with: B foot ischemia, known BLE PAD, chronic kidney disease stage 2   I gave the patient rx for Augmentin DS 1 po BID x 7 days as pt already was given Keflex recently.    The problem with ABI in diabetic patients is medial calcifications tends to lead unfounded optimism of the status of the tibial arteries.    I don't have access to the study to review the waveforms myself, so it's impossible to determine if the waveforms were truly as read.  Given the presence of possible infected ischemic tissue in the L foot, I would recommend an aggressive stance.  Based on the patient's vascular studies and examination, I have offered the patient: aortogram, left leg runoff and possible intervention. I discussed with the patient the nature of angiographic procedures, especially the limited patencies of any endovascular intervention.   The patient is aware of that the risks of an angiographic procedure include but are not limited to: bleeding, infection, access site complications, renal failure,  embolization, rupture of vessel, dissection, arteriovenous fistula, possible need for emergent surgical intervention, possible need for surgical procedures to treat the patient's pathology, anaphylactic reaction to contrast, and stroke and death.    The patient want time to reconsider proceeding.  She is going to call us if she agrees to proceed.  I discussed in depth with the patient the nature of atherosclerosis, and emphasized the importance of maximal medical management including strict control of blood pressure, blood glucose, and lipid levels, obtaining regular exercise, antiplatelet agents, and cessation of smoking.   The patient is currently not on a statin due to reported allergy.  The patient is currently on an anti-platelet: ASA and Plavix.  The patient is aware that without maximal medical management the underlying atherosclerotic disease process will progress, limiting the benefit of any interventions.  Thank you for allowing [place] to participate in this patient's care.   Leonides Sake, MD, FACS [facility] Office: [phone] Pager: [phone]  06/30/2017, 11:30 AM

## 2017-07-01 ENCOUNTER — Telehealth (HOSPITAL_COMMUNITY): Payer: Self-pay | Admitting: Vascular Surgery

## 2017-07-01 ENCOUNTER — Other Ambulatory Visit: Payer: Self-pay | Admitting: *Deleted

## 2017-07-01 MED ORDER — AMOXICILLIN-POT CLAVULANATE 875-125 MG PO TABS: 1.0000 | ORAL_TABLET | Freq: Two times a day (BID) | ORAL | 0 refills | Status: AC

## 2017-07-01 NOTE — Progress Notes (Signed)
ZULEY, LUTTER ([id number]) Visit Report for 06/29/2017 HPI Details Patient Name: [name], [name] Date of Service: 06/29/2017 1:30 PM Medical Record Number: [id number] Patient Account Number: [id number] Date of Birth/Sex: 1946/05/02 (72 y.o. Female) Treating RN: Huel Coventry Primary Care Provider: Quintin Alto Other Clinician: Referring Provider: Quintin Alto Treating Provider/Extender: Altamese [place] in Treatment: 2 History of Present Illness Location: right foot toes Quality: Patient reports experiencing a sharp pain to affected area(s). Severity: Patient states wound are getting worse. Duration: Patient has had the wound for < 2 weeks prior to presenting for treatment Timing: Pain in wound is constant (hurts all the time) Context: The wound appeared gradually over time Modifying Factors: Other treatment(s) tried include:recent interventional process to place a stent in her right lower extremity for critical limb ischemia Associated Signs and Symptoms: Patient reports having foul odor. HPI Description: 72 year old female who was referred by Dr. Nanetta Batty, who performed a endovascular procedure on 05/26/2017, for critical limb ischemia. Her angiogram showed a 99% below the knee popliteal stenosis which is stented with a 4 mm drug eluting stent. On 06/10/2017 he noted that her pulses are palpable and her Dopplers have normalized. He referred her to wound care for further advice and put her on Keflex 500 mg twice daily for 10 days. She has a past medical history of asthma, coronary artery disease, CHF, hyperlipidemia, ischemic cardiomyopathy, peripheral arterial disease and type 2 diabetes mellitus. She is status post recent abdominal aortogram with right lower extremity stenting, cholecystectomy, CABG, parathyroidectomy, thyroidectomy. She is a former smoker and quit in 1988. She had an ABI performed on 06/10/2017 with the right ABI within normal range and the left ABI  shows mild lower extremity arterial disease. The right ABI was 1.0 and biphasic and the left ABI was 0.93 and biphasic. Toe pressures were abnormal with the right being 0.27 and left being 0.31. the duplex study done showed a patent right popliteal artery stent without evidence of focal stenosis or obstruction. of note the patient started having signs of critical limb ischemia somewhere at the end of November and after an urgent workup was taken up for limb salvage with a successfully placed stent by Dr. Nanetta Batty, with good arterial perfusion postprocedure with normal ABIs noted on 06/10/2017. 06/22/17; patient was admitted to our clinic last week by Dr. Meyer Russel.she is a type II diabetic. She had undergone a right popliteal DES on 11/29 for critical limb ischemia including erythema and pain of her right forefoot. She states about 3 days later she started developing ulcerations in predominantly the right foot but also the left third toe. 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 This is never really healed rib. Repeat ABIs on 12/14 showed a right ABI of 1 and a left ABI of 0.93 with toe pressures be markedly abnormal as noted above and Dr. Marcie Bal notes. The patient is still having a lot of pain making it very difficult for her to function at home this is worse on the right greater than left foot she does describe severe lancinating pain. She does not ambulate all that much and she finds it intolerable to wear footwear Her history is also notable for A. fib, severe cardiomyopathy with an ejection fraction of 25%_30%. Last echocardiogram in June of this year. According to Epic she is on a combination of Eliquis plavix and aspirin. 06/29/17; patient has an appointment with Dr. Imogene Burn of vascular surgery I believe  next week. This was arranged by her primary physician. She is still having a lot of pain in the right side in general most of her toes look  better however. Supposed to be using silver alginate however [facility] applying currently applied wet to dry dressings Electronic Signature(s) Signed: 06/29/2017 5:48:27 PM By: Baltazar Najjar MD Robar, [name] ([id number]) Entered By: Baltazar Najjar on 06/29/2017 16:07:45 Rains, [name] ([id number]) -------------------------------------------------------------------------------- Physical Exam Details Patient Name: [name] Date of Service: 06/29/2017 1:30 PM Medical Record Number: [id number] Patient Account Number: [id number] Date of Birth/Sex: 1946/01/19 (72 y.o. Female) Treating RN: Huel Coventry Primary Care Provider: Quintin Alto Other Clinician: Referring Provider: Quintin Alto Treating Provider/Extender: Maxwell Caul Weeks in Treatment: 2 Constitutional Sitting or standing Blood Pressure is within target range for patient.. Pulse regular and within target range for patient.Marland Kitchen Respirations regular, non-labored and within target range.. Temperature is normal and within the target range for the patient.Marland Kitchen appears in no distress. Respiratory Respiratory effort is easy and symmetric bilaterally. Rate is normal at rest and on room air.. Cardiovascular Pedal pulses absent bilaterally. Lymphatic none palpable in the popliteal or inguinal area. Integumentary (Hair, Skin) no rashes seen. Psychiatric No evidence of depression, anxiety, or agitation. Calm, cooperative, and communicative. Appropriate interactions and affect.. Notes wound exam; #1 right foot. She still has what appears to be dry gangrene on the plantar right big toe as well as wounds on the medial and lateral part of the third toe as well as the medial part of the fourth toe. All of this is very tender and difficult to examine #2 left foot; she has a necrotic wound on the tip of her left third toe which looks somewhat better than last week and also eschar on the medial aspect of the left first toe.  She states this was there well before the rest of this showed up perhaps for as long as he year Electronic Signature(s) Signed: 06/29/2017 5:48:27 PM By: Baltazar Najjar MD Entered By: Baltazar Najjar on 06/29/2017 16:09:55 Herda, [name] ([id number]) -------------------------------------------------------------------------------- Physician Orders Details Patient Name: [name] Date of Service: 06/29/2017 1:30 PM Medical Record Number: [id number] Patient Account Number: [id number] Date of Birth/Sex: [birth year] (72 y.o. Female) Treating RN: Huel Coventry Primary Care Provider: Quintin Alto Other Clinician: Referring Provider: Quintin Alto Treating Provider/Extender: Altamese [place] in Treatment: 2 Verbal / Phone Orders: No Diagnosis Coding Wound Cleansing Wound #1 Right,Distal Toe - Web between 2nd and 3rd o Clean wound with Normal Saline. o Cleanse wound with mild soap and water o May Shower, gently pat wound dry prior to applying new dressing. - Must dry well before applying dressing. Wound #2 Right Toe - Web between 3rd and 4th o Clean wound with Normal Saline. o Cleanse wound with mild soap and water o May Shower, gently pat wound dry prior to applying new dressing. - Must dry well before applying dressing. Wound #3 Right Toe - Web between 4th and 5th o Clean wound with Normal Saline. o Cleanse wound with mild soap and water o May Shower, gently pat wound dry prior to applying new dressing. - Must dry well before applying dressing. Primary Wound Dressing Wound #1 Right,Distal Toe - Web between 2nd and 3rd o Other: - Silvercell Ag; in between 2nd-5th toes. Wound #2 Right Toe - Web between 3rd and 4th o Other: - Silvercell Ag; in between 2nd-5th toes. Wound #3 Right Toe - Web between 4th and 5th o Other: - Silvercell  Ag; in between 2nd-5th toes. Secondary Dressing Wound #1 Right,Distal Toe - Web between 2nd and 3rd o ABD and  Kerlix/Conform Wound #2 Right Toe - Web between 3rd and 4th o ABD and Kerlix/Conform Wound #3 Right Toe - Web between 4th and 5th o ABD and Kerlix/Conform Dressing Change Frequency Wound #1 Right,Distal Toe - Web between 2nd and 3rd o Change dressing every day. Wound #2 Right Toe - Web between 3rd and 4th o Change dressing every day. Wound #3 Right Toe - Web between 4th and 5th Carreon, [place]. ([id number]) o Change dressing every day. Follow-up Appointments Wound #1 Right,Distal Toe - Web between 2nd and 3rd o Return Appointment in 1 week. Wound #2 Right Toe - Web between 3rd and 4th o Return Appointment in 1 week. Wound #3 Right Toe - Web between 4th and 5th o Return Appointment in 1 week. Edema Control Wound #1 Right,Distal Toe - Web between 2nd and 3rd o Elevate legs to the level of the heart and pump ankles as often as possible Wound #2 Right Toe - Web between 3rd and 4th o Elevate legs to the level of the heart and pump ankles as often as possible Wound #3 Right Toe - Web between 4th and 5th o Elevate legs to the level of the heart and pump ankles as often as possible Home Health Wound #1 Right,Distal Toe - Web between 2nd and 3rd o Initiate Home Health for Skilled Nursing - [facility] o Home Health Nurse may visit PRN to address patientos wound care needs. o FACE TO FACE ENCOUNTER: MEDICARE and MEDICAID PATIENTS: I certify that this patient is under my care and that I had a face-to-face encounter that meets the physician face-to-face encounter requirements with this patient on this date. The encounter with the patient was in whole or in part for the following MEDICAL CONDITION: (primary reason for Home Healthcare) MEDICAL NECESSITY: I certify, that based on my findings, NURSING services are a medically necessary home health service. HOME BOUND STATUS: I certify that my clinical findings support that this patient is homebound (i.e., Due  to illness or injury, pt requires aid of supportive devices such as crutches, cane, wheelchairs, walkers, the use of special transportation or the assistance of another person to leave their place of residence. There is a normal inability to leave the home and doing so requires considerable and taxing effort. Other absences are for medical reasons / religious services and are infrequent or of short duration when for other reasons). o If current dressing causes regression in wound condition, may D/C ordered dressing product/s and apply Normal Saline Moist Dressing daily until next Wound Healing Center / Other MD appointment. Notify Wound Healing Center of regression in wound condition at [phone]. o Please direct any NON-WOUND related issues/requests for orders to patient's Primary Care Physician Wound #2 Right Toe - Web between 3rd and 4th o Initiate Home Health for Skilled [facility] - [facility] o Home Health Nurse may visit PRN to address patientos wound care needs. o FACE TO FACE ENCOUNTER: MEDICARE and MEDICAID PATIENTS: I certify that this patient is under my care and that I had a face-to-face encounter that meets the physician face-to-face encounter requirements with this patient on this date. The encounter with the patient was in whole or in part for the following MEDICAL CONDITION: (primary reason for Home Healthcare) MEDICAL NECESSITY: I certify, that based on my findings, NURSING services are a medically necessary home health service. HOME  BOUND STATUS: I certify that my clinical findings support that this patient is homebound (i.e., Due to illness or injury, pt requires aid of supportive devices such as crutches, cane, wheelchairs, walkers, the use of special transportation or the assistance of another person to leave their place of residence. There is a normal inability to leave the home and doing so requires considerable and taxing effort. Other absences are for  medical reasons / religious services and are infrequent or of short duration when for other reasons). o If current dressing causes regression in wound condition, may D/C ordered dressing product/s and apply Normal Saline Moist Dressing daily until next Wound Healing Center / Other MD appointment. Notify Wound Healing Center of regression in wound condition at [phone]. Tagliaferri, Don PerkingMARY G. ([id number]) o Please direct any NON-WOUND related issues/requests for orders to patient's Primary Care Physician Wound #3 Right Toe - Web between 4th and 5th o Initiate Home Health for Skilled [facility] - [facility] o Home Health Nurse may visit PRN to address patientos wound care needs. o FACE TO FACE ENCOUNTER: MEDICARE and MEDICAID PATIENTS: I certify that this patient is under my care and that I had a face-to-face encounter that meets the physician face-to-face encounter requirements with this patient on this date. The encounter with the patient was in whole or in part for the following MEDICAL CONDITION: (primary reason for Home Healthcare) MEDICAL NECESSITY: I certify, that based on my findings, NURSING services are a medically necessary home health service. HOME BOUND STATUS: I certify that my clinical findings support that this patient is homebound (i.e., Due to illness or injury, pt requires aid of supportive devices such as crutches, cane, wheelchairs, walkers, the use of special transportation or the assistance of another person to leave their place of residence. There is a normal inability to leave the home and doing so requires considerable and taxing effort. Other absences are for medical reasons / religious services and are infrequent or of short duration when for other reasons). o If current dressing causes regression in wound condition, may D/C ordered dressing product/s and apply Normal Saline Moist Dressing daily until next Wound Healing Center / Other MD appointment.  Notify Wound Healing Center of regression in wound condition at [phone]. o Please direct any NON-WOUND related issues/requests for orders to patient's Primary Care Physician Electronic Signature(s) Signed: 06/29/2017 5:48:27 PM By: Baltazar Najjarobson, Annastyn Silvey MD Signed: 06/30/2017 5:50:18 PM By: Elliot GurneyWoody, BSN, RN, CWS, Kim RN, BSN Entered By: Elliot GurneyWoody, BSN, RN, CWS, Kim on 06/29/2017 14:38:27 Crandle, Don PerkingMARY G. ([id number]) -------------------------------------------------------------------------------- Problem List Details Patient Name: Jolyne LoaEFALCO, Honestee G. Date of Service: 06/29/2017 1:30 PM Medical Record Number: [id number] Patient Account Number: [id number] Date of Birth/Sex: 05/20/1946 41(72 y.o. 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 ([id number]) -------------------------------------------------------------------------------- Progress Note Details Patient Name: Jolyne LoaEFALCO, Brittanni G. Date of Service: 06/29/2017 1:30 PM Medical Record Number: [id number] Patient Account Number: [id number] Date of Birth/Sex: 05/20/1946 (71  y.o. Female) Treating RN: Huel Coventry Primary Care Provider: Quintin Alto Other Clinician: Referring Provider: Quintin Alto Treating Provider/Extender: Altamese [place]co in Treatment: 2 Subjective History of Present Illness (HPI) The following HPI elements were documented for the patient's wound: Location: right foot toes Quality: Patient reports experiencing a sharp pain to affected area(s). Severity: Patient states wound are getting worse. Duration: Patient has had the wound for < 2 weeks prior to presenting for treatment Timing: Pain in wound is constant (hurts all the time) Context: The wound appeared gradually over time Modifying Factors: Other treatment(s) tried include:recent interventional process to place a stent in her right lower extremity for critical limb ischemia Associated Signs and Symptoms: Patient reports having foul odor. 72 year old female who was referred by Dr. Nanetta Batty, who performed a endovascular procedure on 05/26/2017, for critical limb ischemia. Her angiogram showed a 99% below the knee popliteal stenosis which is stented with a 4 mm drug eluting stent. On 06/10/2017 he noted that her pulses are palpable and her Dopplers have normalized. He referred her to wound care for further advice and put her on Keflex 500 mg twice daily for 10 days. She has a past medical history of asthma, coronary artery disease, CHF, hyperlipidemia, ischemic cardiomyopathy, peripheral arterial disease and type 2 diabetes mellitus. She is status post recent abdominal aortogram with right lower extremity stenting, cholecystectomy, CABG, parathyroidectomy, thyroidectomy. She is a former smoker and quit in 1988. 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 The right ABI was 1.0 and biphasic and the left ABI was 0.93 and biphasic. Toe pressures were abnormal with the right being 0.27 and left being 0.31. the duplex study done showed a patent right popliteal artery stent without evidence of focal stenosis or obstruction. of note the patient  started having signs of critical limb ischemia somewhere at the end of November and after an urgent workup was taken up for limb salvage with a successfully placed stent by Dr. Nanetta Batty, with good arterial perfusion postprocedure with normal ABIs noted on 06/10/2017. 06/22/17; patient was admitted to our clinic last week by Dr. Meyer Russel.she is a type II diabetic. She had undergone a right popliteal DES on 11/29 for critical limb ischemia including erythema and pain of her right forefoot. She states about 3 days later she started developing ulcerations in predominantly the right foot but also the left third toe. 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 This is never really healed rib. Repeat ABIs on 12/14 showed a right ABI of 1 and a left ABI of 0.93 with toe pressures be markedly abnormal as noted above and Dr. Marcie Bal notes. The patient is still having a lot of pain making it very difficult for her to function at home this is worse on the right greater than left foot she does describe severe lancinating pain. She does not ambulate all that much and she finds it intolerable to wear footwear Her history is also notable for A. fib, severe cardiomyopathy with an ejection fraction of 25%_30%. Last echocardiogram in June of this year. According to Epic she is on a combination of Eliquis plavix and aspirin. 06/29/17; patient has an appointment with Dr. Imogene Burn of vascular surgery I believe next week. This was arranged by her primary physician. She is still having a lot of pain in the right side in general  most of her toes look better however. Supposed to be using silver alginate however [facility] applying currently applied wet to dry dressings Roehrich, Kitty G. ([id number]) Objective Constitutional Sitting or standing Blood Pressure is within target range for patient.. Pulse regular and within target range for patient.Marland Kitchen Respirations regular,  non-labored and within target range.. Temperature is normal and within the target range for the patient.Marland Kitchen appears in no distress. Vitals Time Taken: 1:53 PM, Height: 63 in, Weight: 176 lbs, BMI: 31.2, Temperature: 98.2 F, Pulse: 87 bpm, Respiratory Rate: 16 breaths/min, Blood Pressure: 118/70 mmHg. Respiratory Respiratory effort is easy and symmetric bilaterally. Rate is normal at rest and on room air.. Cardiovascular Pedal pulses absent bilaterally. Lymphatic none palpable in the popliteal or inguinal area. Psychiatric No evidence of depression, anxiety, or agitation. Calm, cooperative, and communicative. Appropriate interactions and affect.. General Notes: wound exam; #1 right foot. She still has what appears to be dry gangrene on the plantar right big toe as well as wounds on the medial and lateral part of the third toe as well as the medial part of the fourth toe. All of this is very tender and difficult to examine #2 left foot; she has a necrotic wound on the tip of her left third toe which looks somewhat better than last week and also eschar on the medial aspect of the left first toe. She states this was there well before the rest of this showed up perhaps for as long as he year Integumentary (Hair, Skin) no rashes seen. Wound #1 status is Open. Original cause of wound was Gradually Appeared. The wound is located on the Right,Distal Toe - Web between 2nd and 3rd. The wound measures 1.5cm length x 1.5cm width x 0.1cm depth; 1.767cm^2 area and 0.177cm^3 volume. Wound #2 status is Open. Original cause of wound was Pressure Injury. The wound is located on the Right Toe - Web between 3rd and 4th. The wound measures 1.4cm length x 0.9cm width x 0.1cm depth; 0.99cm^2 area and 0.099cm^3 volume. Wound #3 status is Open. Original cause of wound was Gradually Appeared. The wound is located on the Right Toe - Web between 4th and 5th. The wound measures 0.5cm length x 0.4cm width x 0.1cm depth;  0.157cm^2 area and 0.016cm^3 volume. There is Fat Layer (Subcutaneous Tissue) Exposed exposed. There is no tunneling or undermining noted. There is a medium amount of serous drainage noted. The wound margin is indistinct and nonvisible. There is no granulation within the wound bed. There is a large (67-100%) amount of necrotic tissue within the wound bed including Adherent Slough. 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 Cleanse wound with mild soap and water May Shower, gently pat wound dry prior to applying new dressing. - Must dry well before applying dressing. Wound #2 Right Toe - Web between 3rd and 4th: Clean wound with Normal Saline. Cleanse wound with mild soap and water May Shower, gently pat wound dry prior to applying new dressing. - Must dry well before applying dressing. Wound #3 Right Toe - Web between 4th and 5th: Clean wound with Normal Saline. Cleanse wound with mild soap  and water May Shower, gently pat wound dry prior to applying new dressing. - Must dry well before applying dressing. Primary Wound Dressing: Wound #1 Right,Distal Toe - Web between 2nd and 3rd: Other: - Silvercell Ag; in between 2nd-5th toes. Wound #2 Right Toe - Web between 3rd and 4th: Other: - Silvercell Ag; in between 2nd-5th toes. Wound #3 Right Toe - Web between 4th and 5th: Other: - Silvercell Ag; in between 2nd-5th toes. Secondary Dressing: Wound #1 Right,Distal Toe - Web between 2nd and 3rd: ABD and Kerlix/Conform Wound #2 Right Toe - Web between 3rd and 4th: ABD and  Kerlix/Conform Wound #3 Right Toe - Web between 4th and 5th: ABD and Kerlix/Conform Dressing Change Frequency: Wound #1 Right,Distal Toe - Web between 2nd and 3rd: Change dressing every day. Wound #2 Right Toe - Web between 3rd and 4th: Change dressing every day. Wound #3 Right Toe - Web between 4th and 5th: Change dressing every day. Follow-up Appointments: Wound #1 Right,Distal Toe - Web between 2nd and 3rd: Return Appointment in 1 week. Wound #2 Right Toe - Web between 3rd and 4th: Return Appointment in 1 week. Wound #3 Right Toe - Web between 4th and 5th: Return Appointment in 1 week. Edema Control: Wound #1 Right,Distal Toe - Web between 2nd and 3rd: Elevate legs to the level of the heart and pump ankles as often as possible Wound #2 Right Toe - Web between 3rd and 4th: Karstens, TWISHA VANPELT ([id number]) Elevate legs to the level of the heart and pump ankles as often as possible Wound #3 Right Toe - Web between 4th and 5th: Elevate legs to the level of the heart and pump ankles as often as possible Home Health: Wound #1 Right,Distal Toe - Web between 2nd and 3rd: [facility] for Sk[facility] - [facility] Home Health Nurse may visit PRN to address patient s wound care needs. FACE TO FACE ENCOUNTER: MEDICARE and MEDICAID PATIENTS: I certify that this patient is under my care and that I had a face-to-face encounter that meets the physician face-to-face encounter requirements with this patient on this date. The encounter with the patient was in whole or in part for the following MEDICAL CONDITION: (primary reason for Home Healthcare) MEDICAL NECESSITY: I certify, that based on my findings, NURSING services are a medically necessary home health service. HOME BOUND STATUS: I certify that my clinical findings support that this patient is homebound (i.e., Due to illness or injury, pt requires aid of supportive devices such as crutches, cane, wheelchairs, walkers, the  use of special transportation or the assistance of another person to leave their place of residence. There is a normal inability to leave the home and doing so requires considerable and taxing effort. Other absences are for medical reasons / religious services and are infrequent or of short duration when for other reasons). If current dressing causes regression in wound condition, may D/C ordered dressing product/s and apply Normal Saline Moist Dressing daily until next Wound Healing Center / Other MD appointment. Notify Wound Healing Center of regression in wound condition at [phone]. Please direct any NON-WOUND related issues/requests for orders to patient's Primary Care Physician Wound #2 Right Toe - Web between 3rd and 4th: Init[facility] for [facility] - [facility] Home Health Nurse may visit PRN to address patient s wound care needs. FACE TO FACE ENCOUNTER: MEDICARE and MEDICAID PATIENTS: I certify that this patient is under my care and that I had a face-to-face  encounter that meets the physician face-to-face encounter requirements with this patient on this date. The encounter with the patient was in whole or in part for the following MEDICAL CONDITION: (primary reason for Home Healthcare) MEDICAL NECESSITY: I certify, that based on my findings, NURSING services are a medically necessary home health service. HOME BOUND STATUS: I certify that my clinical findings support that this patient is homebound (i.e., Due to illness or injury, pt requires aid of supportive devices such as crutches, cane, wheelchairs, walkers, the use of special transportation or the assistance of another person to leave their place of residence. There is a normal inability to leave the home and doing so requires considerable and taxing effort. Other absences are for medical reasons / religious services and are infrequent or of short duration when for other reasons). If current dressing causes  regression in wound condition, may D/C ordered dressing product/s and apply Normal Saline Moist Dressing daily until next Wound Healing Center / Other MD appointment. Notify Wound Healing Center of regression in wound condition at [phone]. Please direct any NON-WOUND related issues/requests for orders to patient's Primary Care Physician Wound #3 Right Toe - Web between 4th and 5th: [facility] for [facility] - [facility] Home Health Nurse may visit PRN to address patient s wound care needs. FACE TO FACE ENCOUNTER: MEDICARE and MEDICAID PATIENTS: I certify that this patient is under my care and that I had a face-to-face encounter that meets the physician face-to-face encounter requirements with this patient on this date. The encounter with the patient was in whole or in part for the following MEDICAL CONDITION: (primary reason for Home Healthcare) MEDICAL NECESSITY: I certify, that based on my findings, NURSING services are a medically necessary home health service. HOME BOUND STATUS: I certify that my clinical findings support that this patient is homebound (i.e., Due to illness or injury, pt requires aid of supportive devices such as crutches, cane, wheelchairs, walkers, the use of special transportation or the assistance of another person to leave their place of residence. There is a normal inability to leave the home and doing so requires considerable and taxing effort. Other absences are for medical reasons / religious services and are infrequent or of short duration when for other reasons). If current dressing causes regression in wound condition, may D/C ordered dressing product/s and apply Normal Saline Moist Dressing daily until next Wound Healing Center / Other MD appointment. Notify Wound Healing Center of regression in wound condition at [phone]. Please direct any NON-WOUND related issues/requests for orders to patient's Primary Care Physician wound  exam we continue with silver alginate to all the areas on her toes Pehrson, Arminda G. ([id number]) #2 will come input from Dr. Imogene Burn next week #3 [facility]. Certainly don't want to add more moisture between the toes especially on the right foot. We want silver alginate Electronic Signature(s) Signed: 06/29/2017 5:48:27 PM By: Baltazar Najjar MD Entered By: Baltazar Najjar on 06/29/2017 16:10:50 Quade, [name] ([id number]) -------------------------------------------------------------------------------- SuperBill Details Patient Name: [name] Date of Service: 06/29/2017 Medical Record Number: [id number] Patient Account Number: [id number] Date of Birth/Sex: 1946/06/15 (72 y.o. 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 Atherosclerosis of nonbiological bypass graft(s) of the extremities with gangrene, right leg Facility Procedures CPT4 Code: 69629528 Description: 99213 - WOUND CARE VISIT-LEV 3 EST PT Modifier: Quantity: 1 Physician Procedures CPT4 Code Description: [id number] 99213 - WC PHYS LEVEL 3 - EST PT ICD-10 Diagnosis Description 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

## 2017-07-01 NOTE — Telephone Encounter (Signed)
Spoke to pt to reschedule 1/18 appt due to [name] not being in the office to 1/16 @ 11:30 am

## 2017-07-01 NOTE — Progress Notes (Signed)
RUSSIA, SCHEIDERER ([id number]) Visit Report for 06/29/2017 Arrival Information Details Patient Name: [name], [name] Date of Service: 06/29/2017 1:30 PM Medical Record Number: [id number] Patient Account Number: [id number] Date of Birth/Sex: 08-19-1945 (71 y.o. Female) Treating RN: Huel Coventry Primary Care Chue Berkovich: Quintin Alto Other Clinician: Referring Ramondo Dietze: Quintin Alto Treating Weldon Nouri/Extender: Altamese [place] in Treatment: 2 Visit Information History Since Last Visit Added or deleted any medications: No Patient Arrived: Walker Any new allergies or adverse reactions: No Arrival Time: 13:54 Had a fall or experienced change in No Accompanied By: husband activities of daily living that may affect Transfer Assistance: None risk of falls: Patient Identification Verified: Yes Signs or symptoms of abuse/neglect since last visito No Secondary Verification Process Yes Hospitalized since last visit: No Completed: Has Dressing in Place as Prescribed: Yes Patient Requires Transmission-Based No Pain Present Now: Yes Precautions: Patient Has Alerts: Yes Patient Alerts: Patient on Blood Thinner Prediabetic Eliquis Electronic Signature(s) Signed: 06/30/2017 5:50:18 PM By: Elliot Gurney, BSN, RN, CWS, Kim RN, BSN Entered By: Elliot Gurney, BSN, RN, CWS, Kim on 06/29/2017 13:54:34 Fessenden, [name] ([id number]) -------------------------------------------------------------------------------- Clinic Level of Care Assessment Details Patient Name: [name] Date of Service: 06/29/2017 1:30 PM Medical Record Number: [id number] Patient Account Number: [id number] Date of Birth/Sex: [birth year] (71 y.o. Female) Treating RN: Huel Coventry Primary Care Jennaya Pogue: Quintin Alto Other Clinician: Referring Khai Torbert: Quintin Alto Treating Autie Vasudevan/Extender: Altamese [place] in Treatment: 2 Clinic Level of Care Assessment Items TOOL 4 Quantity Score []  - Use when only an EandM is  performed on FOLLOW-UP visit 0 ASSESSMENTS - Nursing Assessment / Reassessment []  - Reassessment of Co-morbidities (includes updates in patient status) 0 X- 1 5 Reassessment of Adherence to Treatment Plan ASSESSMENTS - Wound and Skin Assessment / Reassessment []  - Simple Wound Assessment / Reassessment - one wound 0 X- 1 5 Complex Wound Assessment / Reassessment - multiple wounds []  - 0 Dermatologic / Skin Assessment (not related to wound area) ASSESSMENTS - Focused Assessment []  - Circumferential Edema Measurements - multi extremities 0 []  - 0 Nutritional Assessment / Counseling / Intervention []  - 0 Lower Extremity Assessment (monofilament, tuning fork, pulses) []  - 0 Peripheral Arterial Disease Assessment (using hand held doppler) ASSESSMENTS - Ostomy and/or Continence Assessment and Care []  - Incontinence Assessment and Management 0 []  - 0 Ostomy Care Assessment and Management (repouching, etc.) PROCESS - Coordination of Care []  - Simple Patient / Family Education for ongoing care 0 X- 1 20 Complex (extensive) Patient / Family Education for ongoing care X- 1 10 Staff obtains Chiropractor, Records, Test Results / Process Orders []  - 0 Staff telephones HHA, Nursing Homes / Clarify orders / etc []  - 0 Routine Transfer to another Facility (non-emergent condition) []  - 0 Routine Hospital Admission (non-emergent condition) []  - 0 New Admissions / Manufacturing engineer / Ordering NPWT, Apligraf, etc. []  - 0 Emergency Hospital Admission (emergent condition) []  - 0 Simple Discharge Coordination [name], [name] ([id number]) X- 1 15 Complex (extensive) Discharge Coordination PROCESS - Special Needs []  - Pediatric / Minor Patient Management 0 []  - 0 Isolation Patient Management []  - 0 Hearing / Language / Visual special needs []  - 0 Assessment of Community assistance (transportation, D/C planning, etc.) []  - 0 Additional assistance / Altered mentation []  - 0 Support  Surface(s) Assessment (bed, cushion, seat, etc.) INTERVENTIONS - Wound Cleansing / Measurement []  - Simple Wound Cleansing - one wound 0 X- 2 5 Complex Wound Cleansing - multiple wounds X- 1 5  Wound Imaging (photographs - any number of wounds) []  - 0 Wound Tracing (instead of photographs) []  - 0 Simple Wound Measurement - one wound X- 2 5 Complex Wound Measurement - multiple wounds INTERVENTIONS - Wound Dressings []  - Small Wound Dressing one or multiple wounds 0 []  - 0 Medium Wound Dressing one or multiple wounds X- 1 20 Large Wound Dressing one or multiple wounds []  - 0 Application of Medications - topical []  - 0 Application of Medications - injection INTERVENTIONS - Miscellaneous []  - External ear exam 0 []  - 0 Specimen Collection (cultures, biopsies, blood, body fluids, etc.) []  - 0 Specimen(s) / Culture(s) sent or taken to Lab for analysis []  - 0 Patient Transfer (multiple staff / Nurse, adultHoyer Lift / Similar devices) []  - 0 Simple Staple / Suture removal (25 or less) []  - 0 Complex Staple / Suture removal (26 or more) []  - 0 Hypo / Hyperglycemic Management (close monitor of Blood Glucose) []  - 0 Ankle / Brachial Index (ABI) - do not check if billed separately X- 1 5 Vital Signs [name], [name] ([id number]) Has the patient been seen at the hospital within the last three years: Yes Total Score: 105 Level Of Care: New/Established - Level 3 Electronic Signature(s) Signed: 06/30/2017 5:50:18 PM By: Elliot GurneyWoody, BSN, RN, CWS, Kim RN, BSN Entered By: Elliot GurneyWoody, BSN, RN, CWS, Kim on 06/29/2017 14:19:25 [name], [name] ([id number]) -------------------------------------------------------------------------------- Encounter Discharge Information Details Patient Name: [name] LoaEFALCO, Tamrah G. Date of Service: 06/29/2017 1:30 PM Medical Record Number: [id number] Patient Account Number: [id number] Date of Birth/Sex: 22-Apr-1946 (71 y.o. Female) Treating RN: Huel CoventryWoody, Kim Primary Care Deby Adger: Quintin AltoBURDINE,  STEVEN Other Clinician: Referring Hezakiah Champeau: Quintin AltoBURDINE, STEVEN Treating Izayiah Tibbitts/Extender: Altamese CarolinaOBSON, MICHAEL G Weeks in Treatment: 2 Encounter Discharge Information Items Discharge Pain Level: 2 Discharge Condition: Stable Ambulatory Status: Wheelchair Discharge Destination: Home Transportation: Private Auto Accompanied By: husband Schedule Follow-up Appointment: Yes Medication Reconciliation completed and Yes provided to Patient/Care Klee Kolek: Provided on Clinical Summary of Care: 06/29/2017 Form Type Recipient Paper Patient MD Electronic Signature(s) Signed: 06/29/2017 2:40:13 PM By: Elliot GurneyWoody, BSN, RN, CWS, Kim RN, BSN Entered By: Elliot GurneyWoody, BSN, RN, CWS, Kim on 06/29/2017 14:40:13 [name], [name] ([id number]) -------------------------------------------------------------------------------- Lower Extremity Assessment Details Patient Name: [name] LoaEFALCO, Alima G. Date of Service: 06/29/2017 1:30 PM Medical Record Number: [id number] Patient Account Number: [id number] Date of Birth/Sex: 22-Apr-1946 (71 y.o. Female) Treating RN: Huel CoventryWoody, Kim Primary Care Silvana Holecek: Quintin AltoBURDINE, STEVEN Other Clinician: Referring Shaynah Hund: Quintin AltoBURDINE, STEVEN Treating Simrah Chatham/Extender: Altamese CarolinaOBSON, MICHAEL G Weeks in Treatment: 2 Vascular Assessment Pulses: Dorsalis Pedis Palpable: [Left:Yes] [Right:Yes] Posterior Tibial Extremity colors, hair growth, and conditions: Extremity Color: [Left:Normal] [Right:Normal] Hair Growth on Extremity: [Left:No] [Right:No] Temperature of Extremity: [Left:Warm] [Right:Warm] Capillary Refill: [Left:> 3 seconds] [Right:> 3 seconds] Toe Nail Assessment Left: Right: Thick: No No Discolored: No No Deformed: No No Improper Length and Hygiene: No No Electronic Signature(s) Signed: 06/30/2017 5:50:18 PM By: Elliot GurneyWoody, BSN, RN, CWS, Kim RN, BSN Entered By: Elliot GurneyWoody, BSN, RN, CWS, Kim on 06/29/2017 14:10:40 [name], [name]  ([id number]) -------------------------------------------------------------------------------- Multi Wound Chart Details Patient Name: [name] LoaEFALCO, Feven G. Date of Service: 06/29/2017 1:30 PM Medical Record Number: [id number] Patient Account Number: [id number] Date of Birth/Sex: 22-Apr-1946 (71 y.o. Female) Treating RN: Huel CoventryWoody, Kim Primary Care Dvaughn Fickle: Quintin AltoBURDINE, STEVEN Other Clinician: Referring Arbadella Kimbler: Quintin AltoBURDINE, STEVEN Treating Evelynn Hench/Extender: Altamese CarolinaOBSON, MICHAEL G Weeks in Treatment: 2 Vital Signs Height(in): 63 Pulse(bpm): 87 Weight(lbs): 176 Blood Pressure(mmHg): 118/70 Body Mass Index(BMI): 31 Temperature(F): 98.2 Respiratory Rate 16 (breaths/min): Photos: [3:No Photos] Wound Location: Right, Distal Foot  Left Toe Third Right Toe - Web between 4th and 5th Wounding Event: Gradually Appeared Pressure Injury Gradually Appeared 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 Comorbid History: N/A N/A Anemia, Arrhythmia, Congestive Heart Failure, Hypertension, Myocardial Infarction, Peripheral Arterial Disease, Neuropathy Date Acquired: 05/30/2017 02/21/2017 04/25/2017 Weeks of Treatment: 2 1 0 Wound Status: Open Open Open Clustered Wound: Yes No No Pending Amputation on Yes No No Presentation: Measurements L x W x D 1.5x1.5x0.1 1.4x0.9x0.1 0.5x0.4x0.1 (cm) Area (cm) : 1.767 0.99 0.157 Volume (cm) : 0.177 0.099 0.016 % Reduction in Area: 93.00% 34.40% N/A % Reduction in Volume: 93.00% 34.40% N/A Classification: Full Thickness Without Unstageable/Unclassified Full Thickness Without Exposed Support Structures Exposed Support Structures Exudate Amount: N/A N/A Medium Exudate Type: N/A N/A Serous Exudate Color: N/A N/A amber Wound Margin: N/A N/A Indistinct, nonvisible Granulation Amount: N/A N/A None Present (0%) Necrotic Amount: N/A N/A Large (67-100%) Epithelialization: N/A N/A None Periwound Skin Texture: No Abnormalities Noted  No Abnormalities Noted No Abnormalities Noted [name], [name] ([id number]) Periwound Skin Moisture: No Abnormalities Noted No Abnormalities Noted No Abnormalities Noted Periwound Skin Color: No Abnormalities Noted No Abnormalities Noted No Abnormalities Noted Tenderness on Palpation: No No No Wound Preparation: N/A N/A Ulcer Cleansing: Rinsed/Irrigated with Saline Treatment Notes Wound #1 (Right, Distal Toe - Web between 2nd and 3rd) 1. Cleansed with: Clean wound with Normal Saline Cleanse wound with antibacterial soap and water 4. Dressing Applied: Other dressing (specify in notes) 5. Secondary Dressing Applied ABD Pad Kerlix/Conform 7. Secured with Tape Notes [employer] Wound #2 (Right Toe - Web between 3rd and 4th) 1. Cleansed with: Clean wound with Normal Saline Cleanse wound with antibacterial soap and water 4. Dressing Applied: Other dressing (specify in notes) 5. Secondary Dressing Applied ABD Pad Kerlix/Conform 7. Secured with Tape Notes [employer] Wound #3 (Right Toe - Web between 4th and 5th) 1. Cleansed with: Clean wound with Normal Saline Cleanse wound with antibacterial soap and water 4. Dressing Applied: Other dressing (specify in notes) 5. Secondary Dressing Applied ABD Pad Kerlix/Conform 7. Secured with Tape Notes [employer] Electronic Signature(s) Signed: 06/29/2017 5:48:27 PM By: Baltazar Najjar MD [name], [name] ([id number]) Entered By: Baltazar Najjar on 06/29/2017 16:06:24 [name], [name] ([id number]) -------------------------------------------------------------------------------- Multi-Disciplinary Care Plan Details Patient Name: [name] Date of Service: 06/29/2017 1:30 PM Medical Record Number: [id number] Patient Account Number: [id number] Date of Birth/Sex: 09/03/1945 (71 y.o. Female) Treating RN: Huel Coventry Primary Care Fergus Throne: Quintin Alto Other Clinician: Referring Jaspreet Bodner: Quintin Alto Treating Daizha Anand/Extender:  Altamese [place] in Treatment: 2 Active Inactive ` Abuse / Safety / Falls / Self Care Management Nursing Diagnoses: History of Falls Impaired home maintenance Potential for falls Goals: Patient will not experience any injury related to falls Date Initiated: 06/13/2017 Target Resolution Date: 07/14/2017 Goal Status: Active Patient will remain injury free related to falls Date Initiated: 06/13/2017 Target Resolution Date: 07/14/2017 Goal Status: Active Interventions: Assess fall risk on admission and as needed Treatment Activities: Patient referred to home care : 06/13/2017 Notes: ` Necrotic Tissue Nursing Diagnoses: Impaired tissue integrity related to necrotic/devitalized tissue Goals: Necrotic/devitalized tissue will be minimized in the wound bed Date Initiated: 06/13/2017 Target Resolution Date: 07/14/2017 Goal Status: Active Interventions: Assess patient pain level pre-, during and post procedure and prior to discharge Provide education on necrotic tissue and debridement process Treatment Activities: Apply topical anesthetic as ordered : 06/13/2017 Notes: Serda, [name] ([id number]) ` Orientation to the Wound Care Program Nursing Diagnoses: Knowledge deficit related to  the wound healing center program Goals: Patient/caregiver will verbalize understanding of the Wound Healing Center Program Date Initiated: 06/13/2017 Target Resolution Date: 07/14/2017 Goal Status: Active Interventions: Provide education on orientation to the wound center Notes: ` Wound/Skin Impairment Nursing Diagnoses: Impaired tissue integrity Knowledge deficit related to ulceration/compromised skin integrity Goals: Ulcer/skin breakdown will have a volume reduction of 30% by week 4 Date Initiated: 06/13/2017 Target Resolution Date: 07/14/2017 Goal Status: Active Interventions: Assess ulceration(s) every visit Treatment Activities: Patient referred to home care :  06/13/2017 Topical wound management initiated : 06/13/2017 Notes: Electronic Signature(s) Signed: 06/30/2017 5:50:18 PM By: Elliot Gurney, BSN, RN, CWS, Kim RN, BSN Entered By: Elliot Gurney, BSN, RN, CWS, Kim on 06/29/2017 14:11:06 [name], [name] ([id number]) -------------------------------------------------------------------------------- Pain Assessment Details Patient Name: [name] Date of Service: 06/29/2017 1:30 PM Medical Record Number: [id number] Patient Account Number: [id number] Date of Birth/Sex: [birth year] (71 y.o. Female) Treating RN: Huel Coventry Primary Care Camay Pedigo: Quintin Alto Other Clinician: Referring Nichelle Renwick: Quintin Alto Treating Daivon Rayos/Extender: Altamese [place] in Treatment: 2 Active Problems Location of Pain Severity and Description of Pain Patient Has Paino Yes Site Locations Pain Location: Pain in Ulcers With Dressing Change: Yes Rate the pain. Current Pain Level: 4 Pain Management and Medication Current Pain Management: Goals for Pain Management Topical or injectable lidocaine is offered to patient for acute pain when surgical debridement is performed. If needed, Patient is instructed to use over the counter pain medication for the following 24-48 hours after debridement. Wound care MDs do not prescribed pain medications. Patient has chronic pain or uncontrolled pain. Patient has been instructed to make an appointment with their Primary Care Physician for pain management. Electronic Signature(s) Signed: 06/30/2017 5:50:18 PM By: Elliot Gurney, BSN, RN, CWS, Kim RN, BSN Entered By: Elliot Gurney, BSN, RN, CWS, Kim on 06/29/2017 13:54:47 [name], [name] ([id number]) -------------------------------------------------------------------------------- Patient/Caregiver Education Details Patient Name: [name] Date of Service: 06/29/2017 1:30 PM Medical Record Number: [id number] Patient Account Number: [id number] Date of Birth/Gender: 03/06/1946 (71 y.o.  Female) Treating RN: Huel Coventry Primary Care Physician: Quintin Alto Other Clinician: Referring Physician: Quintin Alto Treating Physician/Extender: Altamese [place] in Treatment: 2 Education Assessment Education Provided To: Patient Education Topics Provided Wound/Skin Impairment: Handouts: Caring for Your Ulcer, Other: Home health orders Methods: Demonstration, Explain/Verbal Responses: State content correctly Electronic Signature(s) Signed: 06/30/2017 5:50:18 PM By: Elliot Gurney, BSN, RN, CWS, Kim RN, BSN Entered By: Elliot Gurney, BSN, RN, CWS, Kim on 06/29/2017 14:40:44 [name], [name] ([id number]) -------------------------------------------------------------------------------- Wound Assessment Details Patient Name: [name] Date of Service: 06/29/2017 1:30 PM Medical Record Number: [id number] Patient Account Number: [id number] Date of Birth/Sex: 10-29-1945 (71 y.o. Female) Treating RN: Huel Coventry Primary Care Lizette Pazos: Quintin Alto Other Clinician: Referring Kanetra Ho: Quintin Alto Treating Diondra Pines/Extender: Maxwell Caul Weeks in Treatment: 2 Wound Status Wound Number: 1 Primary Etiology: Arterial Insufficiency Ulcer Wound Location: Right, Distal Foot Wound Status: Open Wounding Event: Gradually Appeared Date Acquired: 05/30/2017 Weeks Of Treatment: 2 Clustered Wound: Yes Pending Amputation On Presentation Photos Photo Uploaded By: Elliot Gurney, BSN, RN, CWS, Kim on 06/29/2017 15:25:03 Wound Measurements Length: (cm) 1.5 Width: (cm) 1.5 Depth: (cm) 0.1 Area: (cm) 1.767 Volume: (cm) 0.177 % Reduction in Area: 93% % Reduction in Volume: 93% Wound Description Full Thickness Without Exposed Support Classification: Structures Periwound Skin Texture Texture Color No Abnormalities Noted: No No Abnormalities Noted: No Moisture No Abnormalities Noted: No Electronic Signature(s) Signed: 06/30/2017 5:50:18 PM By: Elliot Gurney, BSN, RN, CWS, Kim RN, BSN Entered  By: Elliot Gurney, BSN,  RN, CWS, Kim on 06/29/2017 14:09:59 [place], [name] ([id number]) -------------------------------------------------------------------------------- Wound Assessment Details Patient Name: MELYSA, SCHROYER Date of Service: 06/29/2017 1:30 PM Medical Record Number: [id number] Patient Account Number: [id number] Date of Birth/Sex: 1945-09-08 (72 y.o. Female) Treating RN: Huel Coventry Primary Care Jaydrian Corpening: Quintin Alto Other Clinician: Referring Tamarion Haymond: Quintin Alto Treating Colbie Danner/Extender: Maxwell Caul Weeks in Treatment: 2 Wound Status Wound Number: 2 Primary Etiology: Pressure Ulcer Wound Location: Left Toe Third Wound Status: Open Wounding Event: Pressure Injury Date Acquired: 02/21/2017 Weeks Of Treatment: 1 Clustered Wound: No Photos Photo Uploaded By: Elliot Gurney, BSN, RN, CWS, Kim on 06/29/2017 15:25:03 Wound Measurements Length: (cm) 1.4 Width: (cm) 0.9 Depth: (cm) 0.1 Area: (cm) 0.99 Volume: (cm) 0.099 % Reduction in Area: 34.4% % Reduction in Volume: 34.4% Wound Description Classification: Unstageable/Unclassified Periwound Skin Texture Texture Color No Abnormalities Noted: No No Abnormalities Noted: No Moisture No Abnormalities Noted: No Electronic Signature(s) Signed: 06/30/2017 5:50:18 PM By: Elliot Gurney, BSN, RN, CWS, Kim RN, BSN Entered By: Elliot Gurney, BSN, RN, CWS, Kim on 06/29/2017 14:09:59 Steen, [name] ([id number]) -------------------------------------------------------------------------------- Wound Assessment Details Patient Name: [name] Date of Service: 06/29/2017 1:30 PM Medical Record Number: [id number] Patient Account Number: [id number] Date of Birth/Sex: 1945-07-30 (71 y.o. Female) Treating RN: Huel Coventry Primary Care Nazirah Tri: Quintin Alto Other Clinician: Referring Shane Badeaux: Quintin Alto Treating Mykel Sponaugle/Extender: Altamese [place] in Treatment: 2 Wound Status Wound Number: 3 Primary Arterial Insufficiency  Ulcer Etiology: Wound Location: Right Toe - Web between 4th and 5th Wound Open Wounding Event: Gradually Appeared Status: Date Acquired: 04/25/2017 Comorbid Anemia, Arrhythmia, Congestive Heart Failure, Weeks Of Treatment: 0 History: Hypertension, Myocardial Infarction, Peripheral Clustered Wound: No Arterial Disease, Neuropathy Wound Measurements Length: (cm) 0.5 Width: (cm) 0.4 Depth: (cm) 0.1 Area: (cm) 0.157 Volume: (cm) 0.016 % Reduction in Area: % Reduction in Volume: Epithelialization: None Tunneling: No Undermining: No Wound Description Full Thickness Without Exposed Support Classification: Structures Wound Margin: Indistinct, nonvisible Exudate Medium Amount: Exudate Type: Serous Exudate Color: amber Foul Odor After Cleansing: No Slough/Fibrino Yes Wound Bed Granulation Amount: None Present (0%) Exposed Structure Necrotic Amount: Large (67-100%) Fascia Exposed: No Necrotic Quality: Adherent Slough Fat Layer (Subcutaneous Tissue) Exposed: Yes Tendon Exposed: No Muscle Exposed: No Joint Exposed: No Bone Exposed: No Periwound Skin Texture Texture Color No Abnormalities Noted: No No Abnormalities Noted: No Moisture No Abnormalities Noted: No Wound Preparation Ulcer Cleansing: Rinsed/Irrigated with Saline Treatment Notes Wound #3 (Right Toe - Web between 4th and 5th) Arnesen, Julianny G. ([id number]) 1. Cleansed with: Clean wound with Normal Saline Cleanse wound with antibacterial soap and water 4. Dressing Applied: Other dressing (specify in notes) 5. Secondary Dressing Applied ABD Pad Kerlix/Conform 7. Secured with Tape Notes Biomedical scientist) Signed: 06/29/2017 2:37:00 PM By: Elliot Gurney, BSN, RN, CWS, Kim RN, BSN Entered By: Elliot Gurney, BSN, RN, CWS, Kim on 06/29/2017 14:36:59 Rimel, [name] ([id number]) -------------------------------------------------------------------------------- Vitals Details Patient Name: [name] Date of Service: 06/29/2017 1:30 PM Medical Record Number: [id number] Patient Account Number: [id number] Date of Birth/Sex: 1945-09-22 (71 y.o. Female) Treating RN: Huel Coventry Primary Care Janayla Marik: Quintin Alto Other Clinician: Referring Carigan Lister: Quintin Alto Treating Laraine Samet/Extender: Altamese  in Treatment: 2 Vital Signs Time Taken: 13:53 Temperature (F): 98.2 Height (in): 63 Pulse (bpm): 87 Weight (lbs): 176 Respiratory Rate (breaths/min): 16 Body Mass Index (BMI): 31.2 Blood Pressure (mmHg): 118/70 Reference Range: 80 - 120 mg / dl Electronic Signature(s) Signed: 06/30/2017 5:50:18 PM By: Elliot Gurney, BSN, RN, CWS, Kim RN, BSN Entered  By: Elliot Gurney, BSN, RN, CWS, Kim on 06/29/2017 13:54:51

## 2017-07-06 ENCOUNTER — Encounter: Payer: Medicare Other | Admitting: Internal Medicine

## 2017-07-06 DIAGNOSIS — I70235 Atherosclerosis of native arteries of right leg with ulceration of other part of foot: Secondary | ICD-10-CM | POA: Diagnosis not present

## 2017-07-07 ENCOUNTER — Other Ambulatory Visit: Payer: Self-pay | Admitting: *Deleted

## 2017-07-07 NOTE — Progress Notes (Signed)
[name], Rhona G. ([id number]) Visit Report for 07/06/2017 Arrival Information Details Patient Name: [name], [name] Date of Service: 07/06/2017 1:45 PM Medical Record Number: [id number] Patient Account Number: [id number] Date of Birth/Sex: 05-Apr-1946 (71 y.o. Female) Treating RN: Renne CriglerFlinchum, Cheryl Primary Care Arbor Cohen: Quintin AltoBURDINE, STEVEN Other Clinician: Referring Yancey Pedley: Quintin AltoBURDINE, STEVEN Treating Zaya Kessenich/Extender: Altamese CarolinaOBSON, MICHAEL G Weeks in Treatment: 3 Visit Information History Since Last Visit All ordered tests and consults were completed: No Patient Arrived: Wheel Chair Added or deleted any medications: No Arrival Time: 14:02 Any new allergies or adverse reactions: No Accompanied By: husband Had a fall or experienced change in No Transfer Assistance: None activities of daily living that may affect Patient Requires Transmission-Based No risk of falls: Precautions: Signs or symptoms of abuse/neglect since last visito No Patient Has Alerts: Yes Hospitalized since last visit: No Patient Alerts: Patient on Blood Pain Present Now: No Thinner Prediabetic Eliquis Electronic Signature(s) Signed: 07/06/2017 3:18:20 PM By: Renne CriglerFlinchum, Cheryl Entered By: Renne CriglerFlinchum, Cheryl on 07/06/2017 14:03:30 Wahlen, Don PerkingMARY G. ([id number]) -------------------------------------------------------------------------------- Clinic Level of Care Assessment Details Patient Name: [name] LoaEFALCO, [name] Date of Service: 07/06/2017 1:45 PM Medical Record Number: [id number] Patient Account Number: [id number] Date of Birth/Sex: 05-Apr-1946 (71 y.o. Female) Treating RN: Renne CriglerFlinchum, Cheryl Primary Care Tashala Cumbo: Quintin AltoBURDINE, STEVEN Other Clinician: Referring Loetta Connelley: Quintin AltoBURDINE, STEVEN Treating Trystyn Sitts/Extender: Altamese CarolinaOBSON, MICHAEL G Weeks in Treatment: 3 Clinic Level of Care Assessment Items TOOL 4 Quantity Score []  - Use when only an EandM is performed on FOLLOW-UP visit 0 ASSESSMENTS - Nursing Assessment / Reassessment []  -  Reassessment of Co-morbidities (includes updates in patient status) 0 X- 1 5 Reassessment of Adherence to Treatment Plan ASSESSMENTS - Wound and Skin Assessment / Reassessment []  - Simple Wound Assessment / Reassessment - one wound 0 X- 3 5 Complex Wound Assessment / Reassessment - multiple wounds []  - 0 Dermatologic / Skin Assessment (not related to wound area) ASSESSMENTS - Focused Assessment []  - Circumferential Edema Measurements - multi extremities 0 []  - 0 Nutritional Assessment / Counseling / Intervention []  - 0 Lower Extremity Assessment (monofilament, tuning fork, pulses) []  - 0 Peripheral Arterial Disease Assessment (using hand held doppler) ASSESSMENTS - Ostomy and/or Continence Assessment and Care []  - Incontinence Assessment and Management 0 []  - 0 Ostomy Care Assessment and Management (repouching, etc.) PROCESS - Coordination of Care []  - Simple Patient / Family Education for ongoing care 0 X- 1 20 Complex (extensive) Patient / Family Education for ongoing care []  - 0 Staff obtains ChiropractorConsents, Records, Test Results / Process Orders []  - 0 Staff telephones HHA, Nursing Homes / Clarify orders / etc []  - 0 Routine Transfer to another Facility (non-emergent condition) []  - 0 Routine Hospital Admission (non-emergent condition) []  - 0 New Admissions / Manufacturing engineernsurance Authorizations / Ordering NPWT, Apligraf, etc. []  - 0 Emergency Hospital Admission (emergent condition) []  - 0 Simple Discharge Coordination Castilleja, [name] ([id number]) X- 1 15 Complex (extensive) Discharge Coordination PROCESS - Special Needs []  - Pediatric / Minor Patient Management 0 []  - 0 Isolation Patient Management []  - 0 Hearing / Language / Visual special needs []  - 0 Assessment of Community assistance (transportation, D/C planning, etc.) []  - 0 Additional assistance / Altered mentation []  - 0 Support Surface(s) Assessment (bed, cushion, seat, etc.) INTERVENTIONS - Wound Cleansing /  Measurement []  - Simple Wound Cleansing - one wound 0 X- 3 5 Complex Wound Cleansing - multiple wounds X- 1 5 Wound Imaging (photographs - any number of wounds) []  - 0 Wound Tracing (instead of  photographs) []  - 0 Simple Wound Measurement - one wound X- 3 5 Complex Wound Measurement - multiple wounds INTERVENTIONS - Wound Dressings []  - Small Wound Dressing one or multiple wounds 0 X- 3 15 Medium Wound Dressing one or multiple wounds []  - 0 Large Wound Dressing one or multiple wounds []  - 0 Application of Medications - topical []  - 0 Application of Medications - injection INTERVENTIONS - Miscellaneous []  - External ear exam 0 []  - 0 Specimen Collection (cultures, biopsies, blood, body fluids, etc.) []  - 0 Specimen(s) / Culture(s) sent or taken to Lab for analysis []  - 0 Patient Transfer (multiple staff / Nurse, adult / Similar devices) []  - 0 Simple Staple / Suture removal (25 or less) []  - 0 Complex Staple / Suture removal (26 or more) []  - 0 Hypo / Hyperglycemic Management (close monitor of Blood Glucose) []  - 0 Ankle / Brachial Index (ABI) - do not check if billed separately X- 1 5 Vital Signs Aguirre, Catlin G. ([id number]) Has the patient been seen at the hospital within the last three years: Yes Total Score: 140 Level Of Care: New/Established - Level 4 Electronic Signature(s) Signed: 07/06/2017 3:18:20 PM By: Renne Crigler Entered By: Renne Crigler on 07/06/2017 15:00:10 Hoke, [name] ([id number]) -------------------------------------------------------------------------------- Encounter Discharge Information Details Patient Name: [name] Date of Service: 07/06/2017 1:45 PM Medical Record Number: [id number] Patient Account Number: [id number] Date of Birth/Sex: Jul 19, 1945 (71 y.o. Female) Treating RN: Renne Crigler Primary Care Savina Olshefski: Quintin Alto Other Clinician: Referring Randall Rampersad: Quintin Alto Treating Kareem Aul/Extender: Altamese [place] in Treatment: 3 Encounter Discharge Information Items Discharge Pain Level: 2 Discharge Condition: Stable Ambulatory Status: Wheelchair Discharge Destination: Home Transportation: Private Auto Accompanied By: self Schedule Follow-up Appointment: Yes Medication Reconciliation completed and No provided to Patient/Care Navaya Wiatrek: Patient Clinical Summary of Care: Declined Electronic Signature(s) Signed: 07/06/2017 3:18:20 PM By: Renne Crigler Entered By: Renne Crigler on 07/06/2017 15:01:30 Weitman, [name] ([id number]) -------------------------------------------------------------------------------- Lower Extremity Assessment Details Patient Name: [name] Date of Service: 07/06/2017 1:45 PM Medical Record Number: [id number] Patient Account Number: [id number] Date of Birth/Sex: May 23, 1946 (71 y.o. Female) Treating RN: Renne Crigler Primary Care Cordelia Bessinger: Quintin Alto Other Clinician: Referring Satoya Feeley: Quintin Alto Treating Phylliss Strege/Extender: Altamese [place] in Treatment: 3 Edema Assessment Assessed: [Left: No] [Right: No] Edema: [Left: Ye] [Right: s] Vascular Assessment Claudication: Claudication Assessment [Right:None] Pulses: Dorsalis Pedis Palpable: [Right:Yes] Posterior Tibial Extremity colors, hair growth, and conditions: Extremity Color: [Right:Normal] Hair Growth on Extremity: [Right:Yes] Temperature of Extremity: [Right:Cool] Capillary Refill: [Right:> 3 seconds] Toe Nail Assessment Left: Right: Thick: Yes Discolored: Yes Deformed: Yes Improper Length and Hygiene: Yes Electronic Signature(s) Signed: 07/06/2017 3:18:20 PM By: Renne Crigler Entered By: Renne Crigler on 07/06/2017 14:21:27 Muehl, [name] ([id number]) -------------------------------------------------------------------------------- Multi Wound Chart Details Patient Name: [name] Date of Service: 07/06/2017 1:45 PM Medical Record Number:  [id number] Patient Account Number: [id number] Date of Birth/Sex: 11-10-1945 (71 y.o. Female) Treating RN: Renne Crigler Primary Care Naksh Radi: Quintin Alto Other Clinician: Referring Zakariyah Freimark: Quintin Alto Treating Marvia Troost/Extender: Altamese [place] in Treatment: 3 Vital Signs Height(in): 63 Pulse(bpm): 87 Weight(lbs): 176 Blood Pressure(mmHg): 105/62 Body Mass Index(BMI): 31 Temperature(F): 98.4 Respiratory Rate 16 (breaths/min): Photos: [1:No Photos] [2:No Photos] [3:No Photos] Wound Location: [1:Right Toe - Web between 2nd and 3rd - Distal] [2:Right Toe - Web between 3rd and 4th] [3:Right Toe - Web between 4th and 5th] Wounding Event: [1:Gradually Appeared] [2:Pressure Injury] [3:Gradually Appeared] Primary Etiology: [1:Arterial Insufficiency Ulcer] [2:Arterial  Insufficiency Ulcer] [3:Arterial Insufficiency Ulcer] Comorbid History: [1:Anemia, Arrhythmia, Congestive Heart Failure, Hypertension, Myocardial Infarction, Peripheral Arterial Disease, Neuropathy] [2:Anemia, Arrhythmia, Congestive Heart Failure, Hypertension, Myocardial Infarction, Peripheral Arterial  Disease, Neuropathy] [3:Anemia, Arrhythmia, Congestive Heart Failure, Hypertension, Myocardial Infarction, Peripheral Arterial Disease, Neuropathy] Date Acquired: [1:05/30/2017] [2:02/21/2017] [3:04/25/2017] Weeks of Treatment: [1:3] [2:2] [3:1] Wound Status: [1:Open] [2:Open] [3:Open] Clustered Wound: [1:Yes] [2:No] [3:No] Pending Amputation on [1:Yes] [2:No] [3:No] Presentation: Measurements L x W x D [1:1x0.8x0.1] [2:0.4x0.5x0.1] [3:0.3x0.4x0.1] (cm) Area (cm) : [1:0.628] [2:0.157] [3:0.094] Volume (cm) : [1:0.063] [2:0.016] [3:0.009] % Reduction in Area: [1:97.50%] [2:89.60%] [3:40.10%] % Reduction in Volume: [1:97.50%] [2:89.40%] [3:43.80%] Classification: [1:Full Thickness Without Exposed Support Structures] [2:Partial Thickness] [3:Full Thickness Without Exposed Support Structures] Exudate  Amount: [1:None Present] [2:None Present] [3:None Present] Wound Margin: [1:Indistinct, nonvisible] [2:Flat and Intact] [3:Indistinct, nonvisible] Granulation Amount: [1:None Present (0%)] [2:Small (1-33%)] [3:None Present (0%)] Necrotic Amount: [1:Large (67-100%)] [2:Large (67-100%)] [3:Large (67-100%)] Necrotic Tissue: [1:Eschar] [2:Eschar, Adherent Slough] [3:Eschar, Adherent Slough] Exposed Structures: [1:Fascia: No Fat Layer (Subcutaneous Tissue) Exposed: No Tendon: No Muscle: No Joint: No Bone: No] [2:Fascia: No Fat Layer (Subcutaneous Tissue) Exposed: No Tendon: No Muscle: No Joint: No Bone: No] [3:Fat Layer (Subcutaneous Tissue) Exposed: Yes  Fascia: No Tendon: No Muscle: No Joint: No Bone: No] Epithelialization: [1:Medium (34-66%)] [2:Medium (34-66%)] [3:Large (67-100%)] Periwound Skin Texture: Callus: Yes Callus: Yes Callus: Yes Excoriation: No Excoriation: No Excoriation: No Induration: No Induration: No Induration: No Crepitus: No Crepitus: No Crepitus: No Rash: No Rash: No Rash: No Scarring: No Scarring: No Scarring: No Periwound Skin Moisture: Dry/Scaly: Yes Dry/Scaly: Yes Maceration: No Maceration: No Maceration: No Dry/Scaly: No Periwound Skin Color: Atrophie Blanche: No Atrophie Blanche: No Atrophie Blanche: No Cyanosis: No Cyanosis: No Cyanosis: No Ecchymosis: No Ecchymosis: No Ecchymosis: No Erythema: No Erythema: No Erythema: No Hemosiderin Staining: No Hemosiderin Staining: No Hemosiderin Staining: No Mottled: No Mottled: No Mottled: No Pallor: No Pallor: No Pallor: No Rubor: No Rubor: No Rubor: No Temperature: No Abnormality No Abnormality No Abnormality Tenderness on Palpation: Yes Yes Yes Wound Preparation: Ulcer Cleansing: Ulcer Cleansing: Ulcer Cleansing: Rinsed/Irrigated with Saline Rinsed/Irrigated with Saline Rinsed/Irrigated with Saline Topical Anesthetic Applied: Topical Anesthetic Applied: Topical Anesthetic  Applied: Other: lidocaine 4% Other: lidocaine 4% Other: lidocAine 4% Treatment Notes Wound #1 (Right, Distal Toe - Web between 2nd and 3rd) 1. Cleansed with: Clean wound with Normal Saline 2. Anesthetic Topical Lidocaine 4% cream to wound bed prior to debridement 4. Dressing Applied: Other dressing (specify in notes) 5. Secondary Dressing Applied ABD Pad Notes silvercell between toes , kerlix wrap Wound #2 (Right Toe - Web between 3rd and 4th) 1. Cleansed with: Clean wound with Normal Saline 2. Anesthetic Topical Lidocaine 4% cream to wound bed prior to debridement 4. Dressing Applied: Other dressing (specify in notes) 5. Secondary Dressing Applied ABD Pad Notes silvercell between toes , kerlix wrap Wound #3 (Right Toe - Web between 4th and 5th) 1. Cleansed with: Clean wound with Normal Saline 2. Anesthetic Topical Lidocaine 4% cream to wound bed prior to debridement Kievit, Rakiya G. ([id number]) 4. Dressing Applied: Other dressing (specify in notes) 5. Secondary Dressing Applied ABD Pad Notes silvercell between toes , kerlix wrap Electronic Signature(s) Signed: 07/06/2017 4:32:47 PM By: Baltazar Najjar MD Entered By: Baltazar Najjar on 07/06/2017 15:10:59 Roscher, [name] ([id number]) -------------------------------------------------------------------------------- Multi-Disciplinary Care Plan Details Patient Name: [name] Date of Service: 07/06/2017 1:45 PM Medical Record Number: [id number] Patient Account Number: [id number] Date of Birth/Sex: May 07, 1946 (71 y.o. Female) Treating RN:  Renne Crigler Primary Care Ravinder Lukehart: Quintin Alto Other Clinician: Referring Raegan Winders: Quintin Alto Treating Hyla Coard/Extender: Altamese [place] in Treatment: 3 Active Inactive ` Abuse / Safety / Falls / Self Care Management Nursing Diagnoses: History of Falls Impaired home maintenance Potential for falls Goals: Patient will not experience any injury  related to falls Date Initiated: 06/13/2017 Target Resolution Date: 07/14/2017 Goal Status: Active Patient will remain injury free related to falls Date Initiated: 06/13/2017 Target Resolution Date: 07/14/2017 Goal Status: Active Interventions: Assess fall risk on admission and as needed Treatment Activities: Patient referred to home care : 06/13/2017 Notes: ` Necrotic Tissue Nursing Diagnoses: Impaired tissue integrity related to necrotic/devitalized tissue Goals: Necrotic/devitalized tissue will be minimized in the wound bed Date Initiated: 06/13/2017 Target Resolution Date: 07/14/2017 Goal Status: Active Interventions: Assess patient pain level pre-, during and post procedure and prior to discharge Provide education on necrotic tissue and debridement process Treatment Activities: Apply topical anesthetic as ordered : 06/13/2017 Notes: Stovall, [name] ([id number]) ` Orientation to the Wound Care Program Nursing Diagnoses: Knowledge deficit related to the wound healing center program Goals: Patient/caregiver will verbalize understanding of the Wound Healing Center Program Date Initiated: 06/13/2017 Target Resolution Date: 07/14/2017 Goal Status: Active Interventions: Provide education on orientation to the wound center Notes: ` Wound/Skin Impairment Nursing Diagnoses: Impaired tissue integrity Knowledge deficit related to ulceration/compromised skin integrity Goals: Ulcer/skin breakdown will have a volume reduction of 30% by week 4 Date Initiated: 06/13/2017 Target Resolution Date: 07/14/2017 Goal Status: Active Interventions: Assess ulceration(s) every visit Treatment Activities: Patient referred to home care : 06/13/2017 Topical wound management initiated : 06/13/2017 Notes: Electronic Signature(s) Signed: 07/06/2017 3:18:20 PM By: Renne Crigler Entered By: Renne Crigler on 07/06/2017 14:21:34 Detzel, [name]  ([id number]) -------------------------------------------------------------------------------- Pain Assessment Details Patient Name: [name] Date of Service: 07/06/2017 1:45 PM Medical Record Number: [id number] Patient Account Number: [id number] Date of Birth/Sex: 1946/01/05 (71 y.o. Female) Treating RN: Renne Crigler Primary Care Jaaziel Peatross: Quintin Alto Other Clinician: Referring Annlouise Gerety: Quintin Alto Treating Magdelyn Roebuck/Extender: Altamese [place] in Treatment: 3 Active Problems Location of Pain Severity and Description of Pain Patient Has Paino No Site Locations Pain Management and Medication Current Pain Management: Electronic Signature(s) Signed: 07/06/2017 3:18:20 PM By: Renne Crigler Entered By: Renne Crigler on 07/06/2017 14:03:39 Somero, [name] ([id number]) -------------------------------------------------------------------------------- Patient/Caregiver Education Details Patient Name: [name] Date of Service: 07/06/2017 1:45 PM Medical Record Number: [id number] Patient Account Number: [id number] Date of Birth/Gender: 28-Dec-1945 (71 y.o. Female) Treating RN: Renne Crigler Primary Care Physician: Quintin Alto Other Clinician: Referring Physician: Quintin Alto Treating Physician/Extender: Altamese [place] in Treatment: 3 Education Assessment Education Provided To: Patient and Caregiver Education Topics Provided Wound/Skin Impairment: Handouts: Caring for Your Ulcer Methods: Explain/Verbal Responses: State content correctly Electronic Signature(s) Signed: 07/06/2017 3:18:20 PM By: Renne Crigler Entered By: Renne Crigler on 07/06/2017 15:03:43 Baillie, [name] ([id number]) -------------------------------------------------------------------------------- Wound Assessment Details Patient Name: [name] Date of Service: 07/06/2017 1:45 PM Medical Record Number: [id number] Patient Account Number: [id number] Date of  Birth/Sex: 1945-09-11 (72 y.o. Female) Treating RN: Renne Crigler Primary Care Qunisha Bryk: Quintin Alto Other Clinician: Referring Tanishi Nault: Quintin Alto Treating Mysty Kielty/Extender: Altamese  in Treatment: 3 Wound Status Wound Number: 1 Primary Arterial Insufficiency Ulcer Etiology: Wound Location: Right Toe - Web between 2nd and 3rd - Distal Wound Open Status: Wounding Event: Gradually Appeared Comorbid Anemia, Arrhythmia, Congestive Heart Failure, Date Acquired: 05/30/2017 History: Hypertension, Myocardial Infarction, Peripheral Weeks Of Treatment: 3 Arterial Disease, Neuropathy  Clustered Wound: Yes Pending Amputation On Presentation Photos Photo Uploaded By: Renne Crigler on 07/06/2017 15:19:15 Wound Measurements Length: (cm) 1 Width: (cm) 0.8 Depth: (cm) 0.1 Area: (cm) 0.628 Volume: (cm) 0.063 % Reduction in Area: 97.5% % Reduction in Volume: 97.5% Epithelialization: Medium (34-66%) Tunneling: No Undermining: No Wound Description Full Thickness Without Exposed Support Classification: Structures Wound Margin: Indistinct, nonvisible Exudate None Present Amount: Wound Bed Granulation Amount: None Present (0%) Exposed Structure Necrotic Amount: Large (67-100%) Fascia Exposed: No Necrotic Quality: Eschar Fat Layer (Subcutaneous Tissue) Exposed: No Tendon Exposed: No Muscle Exposed: No Joint Exposed: No Bone Exposed: No Hogate, Marleny G. ([id number]) Periwound Skin Texture Texture Color No Abnormalities Noted: No No Abnormalities Noted: No Callus: Yes Atrophie Blanche: No Crepitus: No Cyanosis: No Excoriation: No Ecchymosis: No Induration: No Erythema: No Rash: No Hemosiderin Staining: No Scarring: No Mottled: No Pallor: No Moisture Rubor: No No Abnormalities Noted: No Dry / Scaly: Yes Temperature / Pain Maceration: No Temperature: No Abnormality Tenderness on Palpation: Yes Wound Preparation Ulcer Cleansing:  Rinsed/Irrigated with Saline Topical Anesthetic Applied: Other: lidocaine 4%, Treatment Notes Wound #1 (Right, Distal Toe - Web between 2nd and 3rd) 1. Cleansed with: Clean wound with Normal Saline 2. Anesthetic Topical Lidocaine 4% cream to wound bed prior to debridement 4. Dressing Applied: Other dressing (specify in notes) 5. Secondary Dressing Applied ABD Pad Notes silvercell between toes , kerlix wrap Electronic Signature(s) Signed: 07/06/2017 3:18:20 PM By: Renne Crigler Entered By: Renne Crigler on 07/06/2017 14:18:07 Perezperez, [name] ([id number]) -------------------------------------------------------------------------------- Wound Assessment Details Patient Name: [name] Date of Service: 07/06/2017 1:45 PM Medical Record Number: [id number] Patient Account Number: [id number] Date of Birth/Sex: 1946/01/01 (72 y.o. Female) Treating RN: Renne Crigler Primary Care Imagene Boss: Quintin Alto Other Clinician: Referring Jerrian Mells: Quintin Alto Treating Davy Faught/Extender: Altamese [place] in Treatment: 3 Wound Status Wound Number: 2 Primary Arterial Insufficiency Ulcer Etiology: Wound Location: Right Toe - Web between 3rd and 4th Wound Open Wounding Event: Pressure Injury Status: Date Acquired: 02/21/2017 Comorbid Anemia, Arrhythmia, Congestive Heart Failure, Weeks Of Treatment: 2 History: Hypertension, Myocardial Infarction, Peripheral Clustered Wound: No Arterial Disease, Neuropathy Photos Photo Uploaded By: Renne Crigler on 07/06/2017 15:19:34 Wound Measurements Length: (cm) 0.4 Width: (cm) 0.5 Depth: (cm) 0.1 Area: (cm) 0.157 Volume: (cm) 0.016 % Reduction in Area: 89.6% % Reduction in Volume: 89.4% Epithelialization: Medium (34-66%) Tunneling: No Undermining: No Wound Description Classification: Partial Thickness Wound Margin: Flat and Intact Exudate Amount: None Present Wound Bed Granulation Amount: Small (1-33%) Exposed  Structure Necrotic Amount: Large (67-100%) Fascia Exposed: No Necrotic Quality: Eschar, Adherent Slough Fat Layer (Subcutaneous Tissue) Exposed: No Tendon Exposed: No Muscle Exposed: No Joint Exposed: No Bone Exposed: No Periwound Skin Texture Texture Color No Abnormalities Noted: No No Abnormalities Noted: No Sokoloski, Betha G. ([id number]) Callus: Yes Atrophie Blanche: No Crepitus: No Cyanosis: No Excoriation: No Ecchymosis: No Induration: No Erythema: No Rash: No Hemosiderin Staining: No Scarring: No Mottled: No Pallor: No Moisture Rubor: No No Abnormalities Noted: No Dry / Scaly: Yes Temperature / Pain Maceration: No Temperature: No Abnormality Tenderness on Palpation: Yes Wound Preparation Ulcer Cleansing: Rinsed/Irrigated with Saline Topical Anesthetic Applied: Other: lidocaine 4%, Treatment Notes Wound #2 (Right Toe - Web between 3rd and 4th) 1. Cleansed with: Clean wound with Normal Saline 2. Anesthetic Topical Lidocaine 4% cream to wound bed prior to debridement 4. Dressing Applied: Other dressing (specify in notes) 5. Secondary Dressing Applied ABD Pad Notes silvercell between toes , kerlix wrap Electronic Signature(s) Signed: 07/06/2017 3:18:20  PM By: Renne Crigler Entered By: Renne Crigler on 07/06/2017 14:19:22 Araki, [name] ([id number]) -------------------------------------------------------------------------------- Wound Assessment Details Patient Name: [name] Date of Service: 07/06/2017 1:45 PM Medical Record Number: [id number] Patient Account Number: [id number] Date of Birth/Sex: 1946-03-28 (71 y.o. Female) Treating RN: Renne Crigler Primary Care Joye Wesenberg: Quintin Alto Other Clinician: Referring Bently Wyss: Quintin Alto Treating Florette Thai/Extender: Altamese [place] in Treatment: 3 Wound Status Wound Number: 3 Primary Arterial Insufficiency Ulcer Etiology: Wound Location: Right Toe - Web between 4th and  5th Wound Open Wounding Event: Gradually Appeared Status: Date Acquired: 04/25/2017 Comorbid Anemia, Arrhythmia, Congestive Heart Failure, Weeks Of Treatment: 1 History: Hypertension, Myocardial Infarction, Peripheral Clustered Wound: No Arterial Disease, Neuropathy Photos Photo Uploaded By: Renne Crigler on 07/06/2017 15:19:52 Wound Measurements Length: (cm) 0.3 Width: (cm) 0.4 Depth: (cm) 0.1 Area: (cm) 0.094 Volume: (cm) 0.009 % Reduction in Area: 40.1% % Reduction in Volume: 43.8% Epithelialization: Large (67-100%) Tunneling: No Undermining: No Wound Description Full Thickness Without Exposed Support Classification: Structures Wound Margin: Indistinct, nonvisible Exudate None Present Amount: Foul Odor After Cleansing: No Slough/Fibrino Yes Wound Bed Granulation Amount: None Present (0%) Exposed Structure Necrotic Amount: Large (67-100%) Fascia Exposed: No Necrotic Quality: Eschar, Adherent Slough Fat Layer (Subcutaneous Tissue) Exposed: Yes Tendon Exposed: No Muscle Exposed: No Joint Exposed: No Bone Exposed: No Periwound Skin Texture Treto, Harlym G. ([id number]) Texture Color No Abnormalities Noted: No No Abnormalities Noted: No Callus: Yes Atrophie Blanche: No Crepitus: No Cyanosis: No Excoriation: No Ecchymosis: No Induration: No Erythema: No Rash: No Hemosiderin Staining: No Scarring: No Mottled: No Pallor: No Moisture Rubor: No No Abnormalities Noted: No Dry / Scaly: No Temperature / Pain Maceration: No Temperature: No Abnormality Tenderness on Palpation: Yes Wound Preparation Ulcer Cleansing: Rinsed/Irrigated with Saline Topical Anesthetic Applied: Other: lidocAine 4%, Treatment Notes Wound #3 (Right Toe - Web between 4th and 5th) 1. Cleansed with: Clean wound with Normal Saline 2. Anesthetic Topical Lidocaine 4% cream to wound bed prior to debridement 4. Dressing Applied: Other dressing (specify in notes) 5. Secondary  Dressing Applied ABD Pad Notes silvercell between toes , kerlix wrap Electronic Signature(s) Signed: 07/06/2017 3:18:20 PM By: Renne Crigler Entered By: Renne Crigler on 07/06/2017 14:20:09 Ewing, [name] ([id number]) -------------------------------------------------------------------------------- Vitals Details Patient Name: [name] Date of Service: 07/06/2017 1:45 PM Medical Record Number: [id number] Patient Account Number: [id number] Date of Birth/Sex: 02/17/1946 (72 y.o. Female) Treating RN: Renne Crigler Primary Care Vinaya Sancho: Quintin Alto Other Clinician: Referring Keahi Mccarney: Quintin Alto Treating Zoey Gilkeson/Extender: [place] in Treatment: 3 Vital Signs Time Taken: 02:04 Temperature (F): 98.4 Height (in): 63 Pulse (bpm): 87 Weight (lbs): 176 Respiratory Rate (breaths/min): 16 Body Mass Index (BMI): 31.2 Blood Pressure (mmHg): 105/62 Reference Range: 80 - 120 mg / dl Electronic Signature(s) Signed: 07/06/2017 3:18:20 PM By: Renne Crigler Entered By: Renne Crigler on 07/06/2017 14:04:08

## 2017-07-07 NOTE — Progress Notes (Signed)
Patient called and instructed to be at [facility]  at 5:30 am. NPO past MN except may take any heart, blood pressure or seizure medications with sips of water.Hold others until after the test.  Reviewed directions for any diabetes medications. Must have driver to go home and must have someone overnight. Verbalized understanding.

## 2017-07-07 NOTE — Progress Notes (Signed)
[name], [name] ([id number]) Visit Report for 07/06/2017 HPI Details Patient Name: [name], [name] Date of Service: 07/06/2017 1:45 PM Medical Record Number: [id number] Patient Account Number: [id number] Date of Birth/Sex: December 27, 1945 (71 y.o. Female) Treating RN: [name] CriglerFlinchum, [name] Primary Care Provider: Quintin AltoBURDINE, [name] Other Clinician: Referring Provider: Quintin AltoBURDINE, [name] Treating Provider/Extender: [name] in Treatment: 3 History of Present Illness Location: right foot toes Quality: Patient reports experiencing a sharp pain to affected area(s). Severity: Patient states wound are getting worse. Duration: Patient has had the wound for < 2 [name] prior to presenting for treatment Timing: Pain in wound is constant (hurts all the time) Context: The wound appeared gradually over time Modifying Factors: Other treatment(s) tried include:recent interventional process to place a stent in her right lower extremity for critical limb ischemia Associated Signs and Symptoms: Patient reports having foul odor. HPI Description: 72 year old female who was referred by [name], who performed a endovascular procedure on 05/26/2017, for critical limb ischemia. Her angiogram showed a 99% below the knee popliteal stenosis which is stented with a 4 mm drug eluting stent. On 06/10/2017 he noted that her pulses are palpable and her Dopplers have normalized. He referred her to wound care for further advice and put her on Keflex 500 mg twice daily for 10 days. She has a past medical history of asthma, coronary artery disease, CHF, hyperlipidemia, ischemic cardiomyopathy, peripheral arterial disease and type 2 diabetes mellitus. She is status post recent abdominal aortogram with right lower extremity stenting, cholecystectomy, CABG, parathyroidectomy, thyroidectomy. She is a former smoker and quit in 1988. She had an ABI performed on 06/10/2017 with the right ABI within normal range and the left  ABI shows mild lower extremity arterial disease. The right ABI was 1.0 and biphasic and the left ABI was 0.93 and biphasic. Toe pressures were abnormal with the right being 0.27 and left being 0.31. the duplex study done showed a patent right popliteal artery stent without evidence of focal stenosis or obstruction. of note the patient started having signs of critical limb ischemia somewhere at the end of November and after an urgent workup was taken up for limb salvage with a successfully placed stent by [name], with good arterial perfusion postprocedure with normal ABIs noted on 06/10/2017. 06/22/17; patient was admitted to our clinic last week by [name].she is a type II diabetic. She had undergone a right popliteal DES on 11/29 for critical limb ischemia including erythema and pain of her right forefoot. She states about 3 days later she started developing ulcerations in predominantly the right foot but also the left third toe. 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 This is never really healed rib. Repeat ABIs on 12/14 showed a right ABI of 1 and a left ABI of 0.93 with toe pressures be markedly abnormal as noted above and [name]'s notes. The patient is still having a lot of pain making it very difficult for her to function at home this is worse on the right greater than left foot she does describe severe lancinating pain. She does not ambulate all that much and she finds it intolerable to wear footwear Her history is also notable for A. fib, severe cardiomyopathy with an ejection fraction of 25%_30%. Last echocardiogram in June of this year. According to Epic she is on a combination of Eliquis plavix and aspirin. 06/29/17; patient has an appointment with [name] of vascular surgery I believe  next week. This was arranged by her primary physician. She is still having a lot of pain in the right side in general most of her toes  look better however. Supposed to be using silver alginate however [facility] applying currently applied wet to dry dressings. 07/06/17; the patient went for appointment with Dr. Imogene Burn. She is status post right popliteal stenting with peroneal artery angioplasty with Dr. Gery [name]. She presented to our clinic with bilateral lower extremity wounds in her toes which are exceptionally painful. Dr. Imogene Burn put her on a course of antibiotics. Her arterial studies from 06/10/17 showed an ABI in the right [facility], [name] ([id number]) of 1 and ABI in the left of 0.8. Waveforms at the dorsalis pedis and posterior tibial were biphasic. However routine ABIs were 0.31 bilaterally. The patient has severe bilateral rest pain. Electronic Signature(s) Signed: 07/06/2017 4:32:47 PM By: [name] Najjar MD Entered By: [name] on 07/06/2017 15:18:13 [name] ([id number]) -------------------------------------------------------------------------------- Physical Exam Details Patient Name: [name] Date of Service: 07/06/2017 1:45 PM Medical Record Number: [id number] Patient Account Number: [id number] Date of Birth/Sex: 04-18-1946 (72 y.o. Female) Treating RN: [name] Primary Care Provider: Quintin Alto Other Clinician: Referring Provider: Quintin Alto Treating Provider/Extender: [name] [place] in Treatment: 3 Constitutional Sitting or standing Blood Pressure is within target range for patient.. Pulse regular and within target range for patient.Marland Kitchen Respirations regular, non-labored and within target range.. Temperature is normal and within the target range for the patient.Marland Kitchen appears in no distress. Eyes Conjunctivae clear. No discharge. Respiratory Respiratory effort is easy and symmetric bilaterally. Rate is normal at rest and on room air.. Cardiovascular Femoral arteries without bruits and pulses strong.. Pedal pulses absent bilaterally.. Lymphatic None palpable in the  popliteal or inguinal area bilaterally. Notes Wound exam; #1 right foot this is very difficult to examine because of pain however she has some degree of dry gangrene on the plantar right big toe. This is stable as well as wounds on the medial and lateral part of the third toe heel part of the fourth toe and this week I think at the tip of the fifth toe. #2 on the left foot she has an ischemic looking area on the medial aspect of the left great toe she states this is actually predating any of her other wounds also on the tip of her left third toe. Electronic Signature(s) Signed: 07/06/2017 4:32:47 PM By: [name] Najjar MD Entered By: [name] on 07/06/2017 15:21:20 Jamerson, [name] ([id number]) -------------------------------------------------------------------------------- Physician Orders Details Patient Name: [name] Date of Service: 07/06/2017 1:45 PM Medical Record Number: [id number] Patient Account Number: [id number] Date of Birth/Sex: July 09, 1945 (71 y.o. Female) Treating RN: [name] Primary Care Provider: Quintin Alto Other Clinician: Referring Provider: Quintin Alto Treating Provider/Extender: [name] [place] in Treatment: 3 Verbal / Phone Orders: No Diagnosis Coding Wound Cleansing Wound #1 Right,Distal Toe - Web between 2nd and 3rd o Clean wound with Normal Saline. Wound #2 Right Toe - Web between 3rd and 4th o Clean wound with Normal Saline. Wound #3 Right Toe - Web between 4th and 5th o Clean wound with Normal Saline. Anesthetic (add to Medication List) Wound #1 Right,Distal Toe - Web between 2nd and 3rd o Topical Lidocaine 4% cream applied to wound bed prior to debridement (In Clinic Only). Wound #2 Right Toe - Web between 3rd and 4th o Topical Lidocaine 4% cream applied to wound bed prior to debridement (In Clinic Only). Wound #3 Right Toe -  Web between 4th and 5th o Topical Lidocaine 4% cream applied to wound bed prior  to debridement (In Clinic Only). Primary Wound Dressing Wound #1 Right,Distal Toe - Web between 2nd and 3rd o Other: - silvercell between toes to base Wound #2 Right Toe - Web between 3rd and 4th o Other: - silvercell between toes to base Wound #3 Right Toe - Web between 4th and 5th o Other: - silvercell between toes to base Secondary Dressing Wound #1 Right,Distal Toe - Web between 2nd and 3rd o ABD pad Wound #2 Right Toe - Web between 3rd and 4th o ABD pad Wound #3 Right Toe - Web between 4th and 5th o ABD pad Dressing Change Frequency Wound #1 Right,Distal Toe - Web between 2nd and 3rd o Change dressing every other day. [name], [name] [name] ([id number]) Wound #2 Right Toe - Web between 3rd and 4th o Change dressing every other day. Wound #3 Right Toe - Web between 4th and 5th o Change dressing every other day. Follow-up Appointments Wound #1 Right,Distal Toe - Web between 2nd and 3rd o Return Appointment in 2 [name]. Wound #2 Right Toe - Web between 3rd and 4th o Return Appointment in 2 [name]. Wound #3 Right Toe - Web between 4th and 5th o Return Appointment in 2 [name]. Electronic Signature(s) Signed: 07/06/2017 3:18:20 PM By: [name] Signed: 07/06/2017 4:32:47 PM By: [name] Najjar MD Entered By: [name] on 07/06/2017 14:59:02 [name] ([id number]) -------------------------------------------------------------------------------- Problem List Details Patient Name: [name] Date of Service: 07/06/2017 1:45 PM Medical Record Number: [id number] Patient Account Number: [id number] Date of Birth/Sex: [birth year] (71 y.o. 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 Female) Treating RN: [name] Primary Care Provider: Quintin Alto Other Clinician: Referring Provider: Quintin Alto Treating Provider/Extender: [name]  in Treatment: 3 Subjective History of Present Illness (HPI) The following HPI elements were documented for the patient's wound: Location: right foot toes Quality: Patient reports experiencing a sharp pain to affected area(s). Severity: Patient states wound are getting worse. Duration: Patient has had the wound for < 2 [name] prior to presenting for treatment Timing: Pain in wound is constant (hurts all the time) Context: The wound appeared gradually over time Modifying Factors: Other treatment(s) tried include:recent interventional process to place a stent in her right lower extremity for critical limb ischemia Associated Signs and Symptoms: Patient reports having foul odor. 72 year old female who was referred by Dr. Nanetta Batty, who performed a endovascular procedure on 05/26/2017, for critical limb ischemia. Her angiogram showed a 99% below the knee popliteal stenosis which is stented with a 4 mm drug eluting stent. On 06/10/2017 he noted that her pulses  are palpable and her Dopplers have normalized. He referred her  to wound care for further advice and put her on Keflex 500 mg twice daily for 10 days. She has a past medical history of asthma, coronary artery disease, CHF, hyperlipidemia, ischemic cardiomyopathy, peripheral arterial disease and type 2 diabetes mellitus. She is status post recent abdominal aortogram with right lower extremity stenting, cholecystectomy, CABG, parathyroidectomy, thyroidectomy. She is a former smoker and quit in 1988. 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 The right ABI was 1.0 and biphasic and the left ABI was 0.93 and biphasic. Toe pressures were abnormal with the right being 0.27 and left being 0.31. the duplex study done showed a patent right popliteal artery stent without evidence of focal stenosis or obstruction. of note the patient started having signs of critical limb ischemia somewhere at the end of November and after an urgent workup was taken up for limb salvage with a successfully placed stent by Dr. Nanetta Batty, with good arterial perfusion postprocedure with normal ABIs noted on 06/10/2017. 06/22/17; patient was admitted to our clinic last week by Dr. Meyer Russel.she is a type II diabetic. She had undergone a right popliteal DES on 11/29 for critical limb ischemia including erythema and pain of her right forefoot. She states about 3 days later she started developing ulcerations in predominantly the right foot but also the left third toe. 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 This is never really healed rib. Repeat ABIs on 12/14 showed a right ABI of 1 and a left ABI of 0.93 with toe pressures be markedly abnormal as noted above and Dr. Marcie Bal notes. The patient is still having a lot of pain making it very difficult for her to function at home this is worse  on the right greater than left foot she does describe severe lancinating pain. She does not ambulate all that much and she finds it intolerable to wear footwear Her history is also notable for A. fib, severe cardiomyopathy with an ejection fraction of 25%_30%. Last echocardiogram in June of this year. According to Epic she is on a combination of Eliquis plavix and aspirin. 06/29/17; patient has an appointment with Dr. Imogene Burn of vascular surgery I believe next week. This was arranged by her primary physician. She is still having a lot of pain in the right side in general most of her toes look better however. Supposed to be using silver alginate however [facility] applying currently applied wet to dry dressings. 07/06/17; the patient went for appointment with Dr. Imogene Burn. She is status post right popliteal stenting with peroneal artery angioplasty with Dr. Gery [name]. She presented to our clinic with bilateral lower extremity wounds in her toes which are exceptionally painful. Dr. Imogene Burn put her on a course of antibiotics. Her arterial studies from 06/10/17 showed an ABI in the right of 1 and ABI in the left of 0.8. Waveforms at the dorsalis pedis and posterior tibial were biphasic. However routine ABIs were [name], [name] ([id number]) 0.31 bilaterally. The patient has severe bilateral rest pain. Objective Constitutional Sitting or standing Blood Pressure is within target range for patient.. Pulse regular and within target range for patient.Marland Kitchen Respirations regular, non-labored and within target range.. Temperature is normal and within the target range for the patient.Marland Kitchen appears in no distress. Vitals Time Taken: 2:04 AM, Height: 63 in, Weight: 176 lbs, BMI: 31.2, Temperature: 98.4 F, Pulse: 87 bpm, Respiratory Rate:  16 breaths/min, Blood Pressure: 105/62 mmHg. Eyes Conjunctivae clear. No discharge. Respiratory Respiratory effort is easy and symmetric bilaterally. Rate is normal at rest and on room  air.. Cardiovascular Femoral arteries without bruits and pulses strong.. Pedal pulses absent bilaterally.. Lymphatic None palpable in the popliteal or inguinal area bilaterally. General Notes: Wound exam; #1 right foot this is very difficult to examine because of pain however she has some degree of dry gangrene on the plantar right big toe. This is stable as well as wounds on the medial and lateral part of the third toe heel part of the fourth toe and this week I think at the tip of the fifth toe. #2 on the left foot she has an ischemic looking area on the medial aspect of the left great toe she states this is actually predating any of her other wounds also on the tip of her left third toe. Integumentary (Hair, Skin) Wound #1 status is Open. Original cause of wound was Gradually Appeared. The wound is located on the Right,Distal Toe - Web between 2nd and 3rd. The wound measures 1cm length x 0.8cm width x 0.1cm depth; 0.628cm^2 area and 0.063cm^3 volume. There is no tunneling or undermining noted. There is a none present amount of drainage noted. The wound margin is indistinct and nonvisible. There is no granulation within the wound bed. There is a large (67-100%) amount of necrotic tissue within the wound bed including Eschar. The periwound skin appearance exhibited: Callus, Dry/Scaly. The periwound skin appearance did not exhibit: Crepitus, Excoriation, Induration, Rash, Scarring, Maceration, Atrophie Blanche, Cyanosis, Ecchymosis, Hemosiderin Staining, Mottled, Pallor, Rubor, Erythema. Periwound temperature was noted as No Abnormality. The periwound has tenderness on palpation. Wound #2 status is Open. Original cause of wound was Pressure Injury. The wound is located on the Right Toe - Web between 3rd and 4th. The wound measures 0.4cm length x 0.5cm width x 0.1cm depth; 0.157cm^2 area and 0.016cm^3 volume. There is no tunneling or undermining noted. There is a none present amount of drainage  noted. The wound margin is flat and intact. There is small (1-33%) granulation within the wound bed. There is a large (67-100%) amount of necrotic tissue within the wound bed including Eschar and Adherent Slough. The periwound skin appearance exhibited: Callus, Dry/Scaly. The periwound skin appearance did not exhibit: Crepitus, Excoriation, Induration, Rash, Scarring, Maceration, Atrophie Blanche, Cyanosis, Ecchymosis, Hemosiderin Staining, Mottled, Pallor, Rubor, Erythema. Periwound temperature was noted as No Abnormality. The periwound has tenderness on palpation. [name] ([id number]) Wound #3 status is Open. Original cause of wound was Gradually Appeared. The wound is located on the Right Toe - Web between 4th and 5th. The wound measures 0.3cm length x 0.4cm width x 0.1cm depth; 0.094cm^2 area and 0.009cm^3 volume. There is Fat Layer (Subcutaneous Tissue) Exposed exposed. There is no tunneling or undermining noted. There is a none present amount of drainage noted. The wound margin is indistinct and nonvisible. There is no granulation within the wound bed. There is a large (67-100%) amount of necrotic tissue within the wound bed including Eschar and Adherent Slough. The periwound skin appearance exhibited: Callus. The periwound skin appearance did not exhibit: Crepitus, Excoriation, Induration, Rash, Scarring, Dry/Scaly, Maceration, Atrophie Blanche, Cyanosis, Ecchymosis, Hemosiderin Staining, Mottled, Pallor, Rubor, Erythema. Periwound temperature was noted as No Abnormality. The periwound has tenderness on palpation. 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 Wound #2 Right Toe - Web between 3rd and 4th: Clean wound with Normal Saline. Wound #3 Right Toe - Web between 4th and 5th: Clean wound with Normal Saline. Anesthetic (add to Medication List): Wound #1 Right,Distal Toe - Web between 2nd and 3rd: Topical Lidocaine 4% cream applied to wound bed prior to debridement (In Clinic Only). Wound #2 Right Toe - Web between 3rd and 4th: Topical Lidocaine 4% cream applied to wound bed prior to debridement (In Clinic Only). Wound #3 Right Toe - Web between 4th and 5th: Topical Lidocaine 4% cream applied to wound bed prior to debridement (In Clinic Only). Primary Wound Dressing: Wound #1 Right,Distal Toe - Web between 2nd and 3rd: Other: - silvercell between toes to base Wound #2 Right Toe - Web between 3rd and 4th: Other: - silvercell between toes to base Wound #3 Right Toe - Web between 4th and 5th: Other: - silvercell between toes to base Secondary Dressing: Wound #1 Right,Distal Toe - Web between 2nd and 3rd: ABD pad Wound #2 Right Toe - Web between 3rd and 4th: ABD pad Wound #3 Right Toe - Web between 4th and 5th: ABD pad Dressing Change Frequency: Arviso, Jorden [name] ([id number]) Wound #1 Right,Distal Toe - Web between 2nd and 3rd: Change dressing every other day. Wound #2 Right Toe - Web between 3rd and 4th: Change dressing every other day. Wound #3 Right Toe - Web between 4th and 5th: Change dressing every other day. Follow-up Appointments: Wound #1 Right,Distal Toe - Web between 2nd and 3rd: Return Appointment in 2 [name]. Wound #2 Right Toe - Web between 3rd and 4th: Return Appointment in 2 [name]. Wound #3 Right Toe - Web between 4th and 5th: Return Appointment in 2 [name]. o #1 this patient has ischemic wounds on her bilateral feet right greater than left. She is managing the pain with narcotics but I [name]'t think the patient will be able to tolerate this much longer. #2 she  is on Plavix and aspirin #3 had a stent placed in the right leg over this is not relieved her pain and the wounds look about the same #4 we are putting silver alginate to all wounds especially between the toes #55 encouraged her to go ahead with the angiography offered by Dr. Imogene Burn, clearly she is still having pain at rest which is severe, hopefully something can be done to improve her blood flow. Otherwise I fear this patient is going to need amputation- type surgery Electronic Signature(s) Signed: 07/06/2017 4:32:47 PM By: [name] Najjar MD Entered By: [name] on 07/06/2017 15:24:53 Babino, [name] ([id number]) -------------------------------------------------------------------------------- SuperBill Details Patient Name: [name] Date of Service: 07/06/2017 Medical Record Number: [id number] Patient Account Number: [id number] Date of Birth/Sex: [birth year] (71 y.o. 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 nonbiological bypass graft(s) of the extremities with gangrene, right leg Facility Procedures CPT4 Code: 08657846 Description: 99214 - WOUND CARE VISIT-LEV 4 EST PT Modifier: Quantity:  1 Physician Procedures CPT4 Code Description: [id number] 99213 - WC PHYS LEVEL 3 - EST PT ICD-10 Diagnosis Description I70.235 Atherosclerosis of native arteries of right leg with ulceration o L97.512 Non-pressure chronic ulcer of other part of right foot with fat l Modifier: f other part of fo ayer exposed Quantity: 1 ot Electronic Signature(s) Signed: 07/06/2017 4:32:47 PM By: [name] Najjar MD Entered By: [name] on 07/06/2017 15:25:14

## 2017-07-08 ENCOUNTER — Telehealth: Payer: Self-pay | Admitting: *Deleted

## 2017-07-08 NOTE — Telephone Encounter (Signed)
-----   Message from Jonelle SidleSamuel G McDowell, MD sent at 07/08/2017  9:59 AM EST ----- Regarding: RE: medication clearance May hold Eliquis as requested. Agree with continuing Plavix.  ----- Message ----- From: Retta Macoberts, Melaysia Streed J, RN Sent: 07/08/2017   9:43 AM To: Jonelle SidleSamuel G McDowell, MD Subject: medication clearance                           Patient is scheduled for Arotogram on 07/14/17 with Dr. Imogene Burnhen. Will need to hold Eliquis at least 3 days prior to procedure. Will remain on Plavix. Request medication clearance ASAP. Please advise via message. Thank you, Museum/gallery conservatorBecky RN

## 2017-07-08 NOTE — Telephone Encounter (Signed)
Reviewed in detail all pre-procedure instructions including to continue Plavix and Hold Eliquis x 3 days prior to test. Verbalized understanding.

## 2017-07-11 NOTE — Progress Notes (Deleted)
Cardiology Office Note  Date: 07/11/2017   ID: [name], DOB 1945-08-07, MRN [id number]  PCP: Juliette Alcide, MD  Primary Cardiologist: Nona Dell, MD   No chief complaint on file.   History of Present Illness: [name] is a medically complex 72 y.o. female last seen in November 2018.  She continues to follow in the Heart Failure clinic, last seen in December 2018, I reviewed the note.  Patient has been evaluated by Dr. Allyson Sabal for assessment of PAD. She underwent DES stenting of a 99% below the knee popliteal stenosis in November of last year along with arthrectomy of the right peroneal artery. She has more recently been seen by Dr. Imogene Burn with [facility] with ischemic foot symptoms and there is plan for additional angiography and possible revascularization.  Past Medical History:  Diagnosis Date  . Asthma   . CAD (coronary artery disease)    Multivessel status post CABG in 2003  . Chronic combined systolic (congestive) and diastolic (congestive) heart failure (HCC) 12/25/2016  . Chronic systolic heart failure (HCC)   . Essential hypertension   . GERD (gastroesophageal reflux disease)   . Gum disease   . History of atrial flutter    Cardioversion 2016 in [place]  . History of colonic polyps   . History of goiter   . Hyperlipidemia   . Hypothyroidism   . Ischemic cardiomyopathy 12/25/2016  . Myocardial infarction (HCC)    2003  . Peripheral arterial disease (HCC)    Stent revascularization 2015 - details not clear  . Renal insufficiency   . Type 2 diabetes mellitus (HCC)     Past Surgical History:  Procedure Laterality Date  . ABDOMINAL AORTOGRAM W/LOWER EXTREMITY Right 05/30/2017   Procedure: ABDOMINAL AORTOGRAM W/LOWER EXTREMITY;  Surgeon: Runell Gess, MD;  Location[facility] INVASIVE CV LAB;  Service: Cardiovascular;  Laterality: Right;  . CARDIAC CATHETERIZATION    . CARDIOVERSION N/A 04/12/2017   Procedure: CARDIOVERSION;  Surgeon: Dolores Patty,  MD;  Location: [facility] ENDOSCOPY;  Service: Cardiovascular;  Laterality: N/A;  . CARDIOVERSION N/A 05/02/2017   Procedure: CARDIOVERSION;  Surgeon: Dolores Patty, MD;  Location: [facility] ENDOSCOPY;  Service: Cardiovascular;  Laterality: N/A;  . CHOLECYSTECTOMY     2012  . CORONARY ARTERY BYPASS GRAFT     2003  . PARATHYROIDECTOMY     2015  . PERIPHERAL VASCULAR INTERVENTION Right 05/30/2017  . PERIPHERAL VASCULAR INTERVENTION Right 05/30/2017   Procedure: PERIPHERAL VASCULAR INTERVENTION;  Surgeon: Runell Gess, MD;  Location: [facility] INVASIVE CV LAB;  Service: Cardiovascular;  Laterality: Right;  . RIGHT/LEFT HEART CATH AND CORONARY/GRAFT ANGIOGRAPHY N/A 04/08/2017   Procedure: RIGHT/LEFT HEART CATH AND CORONARY/GRAFT ANGIOGRAPHY;  Surgeon: Corky Crafts, MD;  Location: [facility] INVASIVE CV LAB;  Service: Cardiovascular;  Laterality: N/A;  . TEE WITHOUT CARDIOVERSION N/A 04/12/2017   Procedure: TRANSESOPHAGEAL ECHOCARDIOGRAM (TEE);  Surgeon: Dolores Patty, MD;  Location: [facility] ENDOSCOPY;  Service: Cardiovascular;  Laterality: N/A;  . THYROIDECTOMY    . TONSILLECTOMY     1967  . ULTRASOUND GUIDANCE FOR VASCULAR ACCESS  04/08/2017   Procedure: Ultrasound Guidance For Vascular Access;  Surgeon: Corky Crafts, MD;  Location: [facility] INVASIVE CV LAB;  Service: Cardiovascular;;    Current Outpatient Medications  Medication Sig Dispense Refill  . acetaminophen (TYLENOL) 500 MG tablet Take 1,000 mg by mouth daily as needed for moderate pain.     Marland Kitchen albuterol (PROVENTIL HFA;VENTOLIN HFA) 108 (90 Base) MCG/ACT inhaler Inhale  2 puffs into the lungs every 6 (six) hours as needed for wheezing or shortness of breath.     Marland Kitchen. amiodarone (PACERONE) 200 MG tablet Take 1 tablet (200 mg total) daily by mouth. 90 tablet 3  . apixaban (ELIQUIS) 5 MG TABS tablet Take 1 tablet (5 mg total) by mouth 2 (two) times daily. 60 tablet 8  . aspirin 81 MG EC tablet Take 1 tablet (81 mg total) by mouth daily.    .  carvedilol (COREG) 6.25 MG tablet Take 1 tablet (6.25 mg total) by mouth 2 (two) times daily. 60 tablet 3  . cephALEXin (KEFLEX) 500 MG capsule Take 1 capsule (500 mg total) by mouth 2 (two) times daily. (Patient not taking: Reported on 07/08/2017) 20 capsule 0  . clopidogrel (PLAVIX) 75 MG tablet Take 1 tablet (75 mg total) by mouth daily. 30 tablet 11  . HYDROcodone-acetaminophen (NORCO) 5-325 MG tablet Take 1 tablet by mouth every 4 (four) hours as needed. (Patient not taking: Reported on 07/08/2017) 10 tablet 0  . HYDROcodone-acetaminophen (NORCO) 7.5-325 MG tablet Take 1 tablet by mouth every 6 (six) hours as needed (for pain.).   0  . levothyroxine (SYNTHROID, LEVOTHROID) 175 MCG tablet Take 175 mcg by mouth daily before breakfast.    . losartan (COZAAR) 25 MG tablet Take 0.5 tablets (12.5 mg total) by mouth daily. 30 tablet 6  . Magnesium 250 MG TABS Take 250 mg by mouth daily.     . magnesium hydroxide (MILK OF MAGNESIA) 400 MG/5ML suspension Take 30 mLs by mouth daily as needed for mild constipation.    . nitroGLYCERIN (NITRODUR - DOSED IN MG/24 HR) 0.2 mg/hr patch PLACE 1 PATCH ONTO THE SKIN DAILY AS NEEDED FOR CHEST PAINS 90 patch 3  . oxymetazoline (AFRIN) 0.05 % nasal spray Place 1 spray into both nostrils daily as needed.     . pantoprazole (PROTONIX) 40 MG tablet Take 1 tablet (40 mg total) by mouth daily. 30 tablet 3  . potassium chloride (K-DUR,KLOR-CON) 10 MEQ tablet Take 2 tablets (20 mEq total) by mouth 2 (two) times daily. 120 tablet 6  . promethazine (PHENERGAN) 25 MG tablet Take 25 mg by mouth every 4 (four) hours as needed for nausea/vomiting.  0  . torsemide (DEMADEX) 20 MG tablet Take 2 tablets (40 mg total) by mouth 2 (two) times daily. May also take 1 tablet (20 mg total) as needed (for 3lb weight gain in 24 hours). 150 tablet 6   No current facility-administered medications for this visit.    Allergies:  Oxycodone; Pollen extract; Ranexa [ranolazine]; and Statins    Social History: The patient  reports that she quit smoking about 31 years ago. Her smoking use included cigarettes. She has a 21.00 pack-year smoking history. she has never used smokeless tobacco. She reports that she does not drink alcohol or use drugs.   Family History: The patient's family history includes Breast cancer in her maternal aunt and paternal aunt; Heart attack in her father; Stroke in her mother.   ROS:  Please see the history of present illness. Otherwise, complete review of systems is positive for {NONE DEFAULTED:18576::"none"}.  All other systems are reviewed and negative.   Physical Exam: VS:  There were no vitals taken for this visit., BMI There is no height or weight on file to calculate BMI.  Wt Readings from Last 3 Encounters:  06/30/17 173 lb (78.5 kg)  06/15/17 176 lb 6.4 oz (80 kg)  06/10/17 174 lb (78.9  kg)    General: Patient appears comfortable at rest. HEENT: Conjunctiva and lids normal, oropharynx clear with moist mucosa. Neck: Supple, no elevated JVP or carotid bruits, no thyromegaly. Lungs: Clear to auscultation, nonlabored breathing at rest. Cardiac: Regular rate and rhythm, no S3 or significant systolic murmur, no pericardial rub. Abdomen: Soft, nontender, no hepatomegaly, bowel sounds present, no guarding or rebound. Extremities: No pitting edema, distal pulses 2+. Skin: Warm and dry. Musculoskeletal: No kyphosis. Neuropsychiatric: Alert and oriented x3, affect grossly appropriate.  ECG: I personally reviewed the tracing from 05/28/21 showed probable sinus rhythm with prolonged PR interval, IVCD, diffuse ST-T wave abnormalities.  Recent Labwork: 12/24/2016: B Natriuretic Peptide 385.0 04/09/2017: ALT 23; AST 26; TSH 4.502 04/12/2017: Magnesium 2.1 06/04/2017: Hemoglobin 11.7; Platelets 355 06/15/2017: BUN 17; Creatinine, Ser 1.12; Potassium 4.3; Sodium 130     Component Value Date/Time   CHOL 153 04/09/2017 0331   TRIG 122 04/09/2017 0331    HDL 31 (L) 04/09/2017 0331   CHOLHDL 4.9 04/09/2017 0331   VLDL 24 04/09/2017 0331   LDLCALC 98 04/09/2017 0331    Other Studies Reviewed Today:  TEE 04/12/2017: Study Conclusions  - Left ventricle: Systolic function was severely reduced. The estimated ejection fraction was in the range of 20% to 25%. Diffuse hypokinesis. No evidence of thrombus. - Aortic valve: No evidence of vegetation. - Mitral valve: No evidence of vegetation. There was mild regurgitation. - Left atrium: The atrium was moderately to severely dilated. No evidence of thrombus in the atrial cavity or appendage. No evidence of thrombus in the atrial cavity or appendage. - Right ventricle: The cavity size was dilated. Systolic function was severely reduced. - Right atrium: The atrium was severely dilated. - Atrial septum: No defect or patent foramen ovale was identified. - Tricuspid valve: No evidence of vegetation. There was moderate regurgitation.  Impressions:  - Successful cardioversion. No cardiac source of emboli was indentified.  Assessment and Plan:    Current medicines were reviewed with the patient today.  No orders of the defined types were placed in this encounter.   Disposition:  Signed, Jonelle Sidle, MD, [facility] 07/11/2017 11:42 AM    [facility] at [facility] [address] [place], [place], [place] [address] Phone: [phone]; Fax: [phone]

## 2017-07-12 ENCOUNTER — Ambulatory Visit: Payer: Medicare Other | Admitting: Cardiology

## 2017-07-13 ENCOUNTER — Encounter (HOSPITAL_COMMUNITY): Payer: Medicare Other

## 2017-07-14 ENCOUNTER — Encounter (HOSPITAL_COMMUNITY)
Admission: RE | Disposition: A | Discharge status: Home / Self Care | Payer: Self-pay | Source: Ambulatory Visit | Attending: Vascular Surgery

## 2017-07-14 ENCOUNTER — Encounter (HOSPITAL_COMMUNITY): Payer: Self-pay | Admitting: Vascular Surgery

## 2017-07-14 ENCOUNTER — Ambulatory Visit (HOSPITAL_COMMUNITY)
Admission: RE | Admit: 2017-07-14 | Discharge: 2017-07-14 | Disposition: A | Discharge status: Home / Self Care | Payer: Medicare Other | Source: Ambulatory Visit | Attending: Vascular Surgery | Admitting: Vascular Surgery

## 2017-07-14 DIAGNOSIS — Z951 Presence of aortocoronary bypass graft: Secondary | ICD-10-CM | POA: Insufficient documentation

## 2017-07-14 DIAGNOSIS — E039 Hypothyroidism, unspecified: Secondary | ICD-10-CM | POA: Insufficient documentation

## 2017-07-14 DIAGNOSIS — I70245 Atherosclerosis of native arteries of left leg with ulceration of other part of foot: Secondary | ICD-10-CM

## 2017-07-14 DIAGNOSIS — Z7982 Long term (current) use of aspirin: Secondary | ICD-10-CM | POA: Diagnosis not present

## 2017-07-14 DIAGNOSIS — I130 Hypertensive heart and chronic kidney disease with heart failure and stage 1 through stage 4 chronic kidney disease, or unspecified chronic kidney disease: Secondary | ICD-10-CM | POA: Diagnosis not present

## 2017-07-14 DIAGNOSIS — Z79899 Other long term (current) drug therapy: Secondary | ICD-10-CM | POA: Insufficient documentation

## 2017-07-14 DIAGNOSIS — Z8679 Personal history of other diseases of the circulatory system: Secondary | ICD-10-CM | POA: Diagnosis not present

## 2017-07-14 DIAGNOSIS — J45909 Unspecified asthma, uncomplicated: Secondary | ICD-10-CM | POA: Insufficient documentation

## 2017-07-14 DIAGNOSIS — L97529 Non-pressure chronic ulcer of other part of left foot with unspecified severity: Secondary | ICD-10-CM | POA: Diagnosis not present

## 2017-07-14 DIAGNOSIS — L97509 Non-pressure chronic ulcer of other part of unspecified foot with unspecified severity: Secondary | ICD-10-CM | POA: Diagnosis present

## 2017-07-14 DIAGNOSIS — E1122 Type 2 diabetes mellitus with diabetic chronic kidney disease: Secondary | ICD-10-CM | POA: Diagnosis not present

## 2017-07-14 DIAGNOSIS — N182 Chronic kidney disease, stage 2 (mild): Secondary | ICD-10-CM | POA: Insufficient documentation

## 2017-07-14 DIAGNOSIS — Z7901 Long term (current) use of anticoagulants: Secondary | ICD-10-CM | POA: Insufficient documentation

## 2017-07-14 DIAGNOSIS — E785 Hyperlipidemia, unspecified: Secondary | ICD-10-CM | POA: Insufficient documentation

## 2017-07-14 DIAGNOSIS — I700 Atherosclerosis of aorta: Secondary | ICD-10-CM | POA: Insufficient documentation

## 2017-07-14 DIAGNOSIS — I70235 Atherosclerosis of native arteries of right leg with ulceration of other part of foot: Secondary | ICD-10-CM | POA: Insufficient documentation

## 2017-07-14 DIAGNOSIS — I5042 Chronic combined systolic (congestive) and diastolic (congestive) heart failure: Secondary | ICD-10-CM | POA: Insufficient documentation

## 2017-07-14 DIAGNOSIS — I998 Other disorder of circulatory system: Secondary | ICD-10-CM | POA: Diagnosis present

## 2017-07-14 DIAGNOSIS — E11621 Type 2 diabetes mellitus with foot ulcer: Secondary | ICD-10-CM | POA: Insufficient documentation

## 2017-07-14 DIAGNOSIS — I255 Ischemic cardiomyopathy: Secondary | ICD-10-CM | POA: Insufficient documentation

## 2017-07-14 DIAGNOSIS — I2510 Atherosclerotic heart disease of native coronary artery without angina pectoris: Secondary | ICD-10-CM | POA: Insufficient documentation

## 2017-07-14 DIAGNOSIS — L97519 Non-pressure chronic ulcer of other part of right foot with unspecified severity: Secondary | ICD-10-CM | POA: Insufficient documentation

## 2017-07-14 DIAGNOSIS — Z95820 Peripheral vascular angioplasty status with implants and grafts: Secondary | ICD-10-CM | POA: Diagnosis not present

## 2017-07-14 DIAGNOSIS — I719 Aortic aneurysm of unspecified site, without rupture: Secondary | ICD-10-CM | POA: Diagnosis not present

## 2017-07-14 DIAGNOSIS — Z885 Allergy status to narcotic agent status: Secondary | ICD-10-CM | POA: Insufficient documentation

## 2017-07-14 DIAGNOSIS — E1151 Type 2 diabetes mellitus with diabetic peripheral angiopathy without gangrene: Secondary | ICD-10-CM | POA: Diagnosis not present

## 2017-07-14 DIAGNOSIS — Z7902 Long term (current) use of antithrombotics/antiplatelets: Secondary | ICD-10-CM | POA: Insufficient documentation

## 2017-07-14 DIAGNOSIS — I70229 Atherosclerosis of native arteries of extremities with rest pain, unspecified extremity: Secondary | ICD-10-CM | POA: Diagnosis present

## 2017-07-14 DIAGNOSIS — Z87891 Personal history of nicotine dependence: Secondary | ICD-10-CM | POA: Insufficient documentation

## 2017-07-14 DIAGNOSIS — Z888 Allergy status to other drugs, medicaments and biological substances status: Secondary | ICD-10-CM | POA: Insufficient documentation

## 2017-07-14 HISTORY — PX: ULTRASOUND GUIDANCE FOR VASCULAR ACCESS: SHX6516

## 2017-07-14 HISTORY — PX: ABDOMINAL AORTOGRAM W/LOWER EXTREMITY: CATH118223

## 2017-07-14 LAB — POCT I-STAT, CHEM 8
BUN: 23 mg/dL — ABNORMAL HIGH (ref 6–20)
CHLORIDE: 101 mmol/L (ref 101–111)
CREATININE: 1.3 mg/dL — AB (ref 0.44–1.00)
Calcium, Ion: 0.9 mmol/L — ABNORMAL LOW (ref 1.15–1.40)
Glucose, Bld: 135 mg/dL — ABNORMAL HIGH (ref 65–99)
HEMATOCRIT: 27 % — AB (ref 36.0–46.0)
HEMOGLOBIN: 9.2 g/dL — AB (ref 12.0–15.0)
POTASSIUM: 3.6 mmol/L (ref 3.5–5.1)
Sodium: 136 mmol/L (ref 135–145)
TCO2: 26 mmol/L (ref 22–32)

## 2017-07-14 LAB — GLUCOSE, CAPILLARY
GLUCOSE-CAPILLARY: 114 mg/dL — AB (ref 65–99)
Glucose-Capillary: 125 mg/dL — ABNORMAL HIGH (ref 65–99)

## 2017-07-14 SURGERY — ABDOMINAL AORTOGRAM W/LOWER EXTREMITY
Anesthesia: LOCAL

## 2017-07-14 MED ORDER — IODIXANOL 320 MG/ML IV SOLN
INTRAVENOUS | Status: DC | PRN
  Administered 2017-07-14: 105 mL via INTRA_ARTERIAL

## 2017-07-14 MED ORDER — HEPARIN (PORCINE) IN NACL 2-0.9 UNIT/ML-% IJ SOLN
INTRAMUSCULAR | Status: AC | PRN
  Administered 2017-07-14: 1000 mL

## 2017-07-14 MED ORDER — SODIUM CHLORIDE 0.9 % WEIGHT BASED INFUSION: 1.0000 mL/kg/h | INTRAVENOUS | Status: DC

## 2017-07-14 MED ORDER — FENTANYL CITRATE (PF) 100 MCG/2ML IJ SOLN
INTRAMUSCULAR | Status: DC | PRN
  Administered 2017-07-14: 25 ug via INTRAVENOUS

## 2017-07-14 MED ORDER — HEPARIN (PORCINE) IN NACL 2-0.9 UNIT/ML-% IJ SOLN
INTRAMUSCULAR | Status: AC
  Filled 2017-07-14: qty 500

## 2017-07-14 MED ORDER — LIDOCAINE HCL (PF) 1 % IJ SOLN
INTRAMUSCULAR | Status: DC | PRN
  Administered 2017-07-14: 15 mL via INTRADERMAL

## 2017-07-14 MED ORDER — MIDAZOLAM HCL 2 MG/2ML IJ SOLN
INTRAMUSCULAR | Status: DC | PRN
  Administered 2017-07-14: 0.5 mg via INTRAVENOUS

## 2017-07-14 MED ORDER — SODIUM CHLORIDE 0.9 % IV SOLN: 250.0000 mL | INTRAVENOUS | Status: DC | PRN

## 2017-07-14 MED ORDER — TRAMADOL HCL 50 MG PO TABS: 50.0000 mg | ORAL_TABLET | Freq: Four times a day (QID) | ORAL | Status: DC | PRN

## 2017-07-14 MED ORDER — SODIUM CHLORIDE 0.9% FLUSH: 3.0000 mL | Freq: Two times a day (BID) | INTRAVENOUS | Status: DC

## 2017-07-14 MED ORDER — TRAMADOL HCL 50 MG PO TABS
50.0000 mg | ORAL_TABLET | Freq: Once | ORAL | Status: AC
Start: 2017-07-14 — End: 2017-07-14
  Administered 2017-07-14: 50 mg via ORAL
  Filled 2017-07-14: qty 1

## 2017-07-14 MED ORDER — MIDAZOLAM HCL 2 MG/2ML IJ SOLN
INTRAMUSCULAR | Status: AC
  Filled 2017-07-14: qty 2

## 2017-07-14 MED ORDER — FENTANYL CITRATE (PF) 100 MCG/2ML IJ SOLN
INTRAMUSCULAR | Status: AC
  Filled 2017-07-14: qty 2

## 2017-07-14 MED ORDER — LIDOCAINE HCL (PF) 1 % IJ SOLN
INTRAMUSCULAR | Status: AC
  Filled 2017-07-14: qty 30

## 2017-07-14 MED ORDER — SODIUM CHLORIDE 0.9% FLUSH: 3.0000 mL | INTRAVENOUS | Status: DC | PRN

## 2017-07-14 MED ORDER — HYDRALAZINE HCL 20 MG/ML IJ SOLN: 5.0000 mg | INTRAMUSCULAR | Status: DC | PRN

## 2017-07-14 MED ORDER — SODIUM CHLORIDE 0.9 % IV SOLN
INTRAVENOUS | Status: DC
  Administered 2017-07-14: 06:00:00 via INTRAVENOUS

## 2017-07-14 MED ORDER — LABETALOL HCL 5 MG/ML IV SOLN: 10.0000 mg | INTRAVENOUS | Status: DC | PRN

## 2017-07-14 SURGICAL SUPPLY — 12 items
CATH OMNI FLUSH 5F 65CM (CATHETERS) ×3 IMPLANT
CATH SOFT-VU 4F 65 STRAIGHT (CATHETERS) ×2 IMPLANT
CATH SOFT-VU STRAIGHT 4F 65CM (CATHETERS) ×1
COVER PRB 48X5XTLSCP FOLD TPE (BAG) ×2 IMPLANT
COVER PROBE 5X48 (BAG) ×1
KIT MICROINTRODUCER STIFF 5F (SHEATH) ×3 IMPLANT
KIT PV (KITS) ×3 IMPLANT
SHEATH PINNACLE 5F 10CM (SHEATH) ×3 IMPLANT
SYR MEDRAD MARK V 150ML (SYRINGE) ×3 IMPLANT
TRANSDUCER W/STOPCOCK (MISCELLANEOUS) ×3 IMPLANT
TRAY PV CATH (CUSTOM PROCEDURE TRAY) ×3 IMPLANT
WIRE BENTSON .035X145CM (WIRE) ×3 IMPLANT

## 2017-07-14 NOTE — Op Note (Signed)
OPERATIVE NOTE   PROCEDURE: 1.  right common femoral artery cannulation under ultrasound guidance 2.  Placement of catheter in aorta 3.  Aortogram 4.  Conscious sedation for 56 min 5.  Second order arterial selection 6.  left leg runoff via catheter 7.  right leg runoff via sheath  PRE-OPERATIVE DIAGNOSIS: left foot ulcer  POST-OPERATIVE DIAGNOSIS: same as above   SURGEON: [name] Sake, MD  ANESTHESIA: conscious sedation  ESTIMATED BLOOD LOSS: 50 cc  CONTRAST: 105 cc  FINDING(S):  Aorta: patent, mild diffuse disease, small ulcerated segment  Superior mesenteric artery: none Celiac artery: none   Right Left  RA patent patent  CIA patent patent  EIA patent patent  IIA patent patent  CFA patent patent  SFA patent patent  PFA patent patent  Pop Patent, below-the-knee popliteal stent patent  Trif patent patent  AT Patent, sub-total occlusion in proximal AT, miniscule  Patent, sub-total occlusion in mid-segment AT, miniscule   Pero Patent, dominant runoff, miniscule Patent, dominant runoff, miniscule  PT Occluded Occluded   SPECIMEN(S):  none  INDICATIONS:   [name] is a 72 y.o. female who presents with bilateral foot ulcer, right healing and left new.  The patient presents for: aortogram, left leg runoff, and possible intervention.  I discussed with the patient the nature of angiographic procedures, especially the limited patencies of any endovascular intervention.  I discussed with the patient the nature of angiographic procedures, especially the limited patencies of any endovascular intervention.  The patient is aware of that the risks of an angiographic procedure include but are not limited to: bleeding, infection, access site complications, renal failure, embolization, rupture of vessel, dissection, arteriovenous fistula, possible need for emergent surgical intervention, possible need for surgical procedures to treat the patient's pathology, anaphylactic  reaction to contrast, and stroke and death.  The patient is aware of the risks and agrees to proceed.    DESCRIPTION: After full informed consent was obtained from the patient, the patient was brought back to the angiography suite.  The patient was placed supine upon the angiography table and connected to cardiopulmonary monitoring equipment.  The patient was then given conscious sedation, the amounts of which are documented in the patient's chart.  The patient was prepped and drape in the standard fashion for an angiographic procedure.  At this point, attention was turned to the right groin.  Under ultrasound guidance, the subcutaneous tissue surrounding the right common femoral artery was anesthesized with 1% lidocaine with epinephrine.  The artery was then cannulated with a micropuncture needle.  The microwire was advanced into the iliac arterial system.  The needle was exchanged for a microsheath, which was loaded into the common femoral artery over the wire.  The microwire was exchanged for a Bentson wire which was advanced into the aorta.  The microsheath was then exchanged for a 5-Fr sheath which was loaded into the common femoral artery.  The Omniflush catheter was then loaded over the wire up to the level of L1.  The catheter was connected to the power injector circuit.  After de-airring and de-clotting the circuit, a power injector aortogram was completed.  The findings are listed above.  The Bentson wire was replaced in the catheter, and using the [facility] and Omniflush catheter, the left common iliac artery was selected.  The wire was advanced into the external iliac artery.  Due inability to advance the catheter, the catheter was exchanged for a straight catheter, which was advanced into the external  iliac artery.  An automated left leg runoff was completed after de-airring and de-clotting the circuit.  The findings are as listed above.    The Bentson wire was replaced in the catheter to  straighten the crook of the catheter.  Both were removed together from the sheath.  The right sheath was aspirated and no clot was present.  The sheath was connected to the power injector circuit.  An automated right leg runoff was completed after de-airring and de-clotting the circuit.  The findings are as listed above.  The sheath was aspirated.  No clots were present and the sheath was reloaded with heparinized saline.    Based on the images, this patient needs: no intervention.  There is no further intervention possible due to the small size of the tibial arteries..   COMPLICATIONS: none  CONDITION: stable   [name] SakeBrian Chen, MD, [facility] [place] Office: [phone] Pager: [phone]  07/14/2017, 8:17 AM

## 2017-07-14 NOTE — Progress Notes (Signed)
Site area: Right groin a 5 french arterial sheath was removed  Site Prior to Removal:  Level 0  Pressure Applied For 20 MINUTES    Bedrest Beginning at 0850a  Manual:   Yes.    Patient Status During Pull:  stable  Post Pull Groin Site:  Level 0  Post Pull Instructions Given:  Yes.    Post Pull Pulses Present:  Yes.    Dressing Applied:  Yes.    Comments:  VS remain stable 

## 2017-07-14 NOTE — Discharge Instructions (Signed)

## 2017-07-14 NOTE — Interval H&P Note (Signed)
   History and Physical Update  The patient was interviewed and re-examined.  The patient's previous History and Physical has been reviewed and is unchanged from my consult.  There is no change in the plan of care: aortogram, left leg runoff, and possible intervention.   I discussed with the patient the nature of angiographic procedures, especially the limited patencies of any endovascular intervention.    The patient is aware of that the risks of an angiographic procedure include but are not limited to: bleeding, infection, access site complications, renal failure, embolization, rupture of vessel, dissection, arteriovenous fistula, possible need for emergent surgical intervention, possible need for surgical procedures to treat the patient's pathology, anaphylactic reaction to contrast, and stroke and death.    The patient is aware of the risks and agrees to proceed.  Laboratory   CBC CBC Latest Ref Rng & Units 06/04/2017 05/31/2017 05/30/2017  WBC 4.0 - 10.5 K/uL 10.2 7.6 8.3  Hemoglobin 12.0 - 15.0 g/dL 11.7(L) 10.4(L) 10.6(L)  Hematocrit 36.0 - 46.0 % 36.7 32.7(L) 33.2(L)  Platelets 150 - 400 K/uL 355 232 231    BMP BMP Latest Ref Rng & Units 06/15/2017 06/04/2017 05/31/2017  Glucose 65 - 99 mg/dL 161(W130(H) 960(A139(H) 92  BUN 6 - 20 mg/dL 17 54(U29(H) 98(J24(H)  Creatinine 0.44 - 1.00 mg/dL 1.91(Y1.12(H) 7.82(N1.36(H) 5.62(Z1.24(H)  BUN/Creat Ratio 12 - 28 - - -  Sodium 135 - 145 mmol/L 130(L) 138 133(L)  Potassium 3.5 - 5.1 mmol/L 4.3 4.0 4.0  Chloride 101 - 111 mmol/L 95(L) 102 103  CO2 22 - 32 mmol/L 24 24 19(L)  Calcium 8.9 - 10.3 mg/dL 9.2 9.5 9.1    Coagulation Lab Results  Component Value Date   INR 1.23 05/29/2017   INR 1.2 05/24/2017   INR 1.54 04/07/2017   No results found for: PTT  Lipids    Component Value Date/Time   CHOL 153 04/09/2017 0331   TRIG 122 04/09/2017 0331   HDL 31 (L) 04/09/2017 0331   CHOLHDL 4.9 04/09/2017 0331   VLDL 24 04/09/2017 0331   LDLCALC 98 04/09/2017 0331      Leonides SakeBrian Skylynne Schlechter, MD, FACS [facility] [place] Office: [phone] Pager: [phone]  07/14/2017, 6:56 AM

## 2017-07-15 ENCOUNTER — Telehealth: Payer: Self-pay | Admitting: Vascular Surgery

## 2017-07-15 ENCOUNTER — Encounter (HOSPITAL_COMMUNITY): Payer: Medicare Other

## 2017-07-15 NOTE — Telephone Encounter (Signed)
-----   Message from Sharee PimpleMarilyn K McChesney, RN sent at 07/14/2017  1:01 PM EST ----- Regarding: 4 weeks    ----- Message ----- From: Fransisco Hertzhen, Brian L, MD Sent: 07/14/2017   8:22 AM To: [facility] [address]  Jolyne LoaMary G [name] [id number] 07-Aug-1945   PROCEDURE: 1.  right common femoral artery cannulation under ultrasound guidance 2.  Placement of catheter in aorta 3.  Aortogram 4.  Conscious sedation for 56 min 5.  Second order arterial selection 6.  left leg runoff via catheter 7.  right leg runoff via sheath  Follow-up: 4 week

## 2017-07-15 NOTE — Telephone Encounter (Signed)
[facility] is off in 4 w. In 5 w, 08/17/17, pt has an appt w/ Dr. Allyson SabalBerry at [facility] at 1:30. Spoke to pt to sch [facility] [place]. 08/17/17 at 3:00.

## 2017-07-20 ENCOUNTER — Encounter: Payer: Medicare Other | Admitting: Internal Medicine

## 2017-07-20 DIAGNOSIS — I70235 Atherosclerosis of native arteries of right leg with ulceration of other part of foot: Secondary | ICD-10-CM | POA: Diagnosis not present

## 2017-07-21 NOTE — Progress Notes (Signed)
[name], [name] ([id number]) Visit Report for 07/20/2017 HPI Details Patient Name: [name], [name] Date of Service: 07/20/2017 10:15 AM Medical Record Number: [id number] Patient Account Number: [id number] Date of Birth/Sex: Nov 25, 1945 (72 y.o. Female) Treating RN: Renne CriglerFlinchum, Cheryl Primary Care Provider: Quintin AltoBURDINE, STEVEN Other Clinician: Referring Provider: Quintin AltoBURDINE, STEVEN Treating Provider/Extender: Altamese CarolinaOBSON, Destini Cambre G Weeks in Treatment: 5 History of Present Illness Location: right foot toes Quality: Patient reports experiencing a sharp pain to affected area(s). Severity: Patient states wound are getting worse. Duration: Patient has had the wound for < 2 weeks prior to presenting for treatment Timing: Pain in wound is constant (hurts all the time) Context: The wound appeared gradually over time Modifying Factors: Other treatment(s) tried include:recent interventional process to place a stent in her right lower extremity for critical limb ischemia Associated Signs and Symptoms: Patient reports having foul odor. HPI Description: 72 year old female who was referred by Dr. Nanetta BattyJonathan Berry, who performed a endovascular procedure on 05/26/2017, for critical limb ischemia. Her angiogram showed a 99% below the knee popliteal stenosis which is stented with a 4 mm drug eluting stent. On 06/10/2017 he noted that her pulses are palpable and her Dopplers have normalized. He referred her to wound care for further advice and put her on Keflex 500 mg twice daily for 10 days. She has a past medical history of asthma, coronary artery disease, CHF, hyperlipidemia, ischemic cardiomyopathy, peripheral arterial disease and type 2 diabetes mellitus. She is status post recent abdominal aortogram with right lower extremity stenting, cholecystectomy, CABG, parathyroidectomy, thyroidectomy. She is a former smoker and quit in 1988. She had an ABI performed on 06/10/2017 with the right ABI within normal range and the  left ABI shows mild lower extremity arterial disease. The right ABI was 1.0 and biphasic and the left ABI was 0.93 and biphasic. Toe pressures were abnormal with the right being 0.27 and left being 0.31. the duplex study done showed a patent right popliteal artery stent without evidence of focal stenosis or obstruction. of note the patient started having signs of critical limb ischemia somewhere at the end of November and after an urgent workup was taken up for limb salvage with a successfully placed stent by Dr. Nanetta BattyJonathan Berry, with good arterial perfusion postprocedure with normal ABIs noted on 06/10/2017. 06/22/17; patient was admitted to our clinic last week by Dr. Meyer RusselBritto.she is a type II diabetic. She had undergone a right popliteal DES on 11/29 for critical limb ischemia including erythema and pain of her right forefoot. She states about 3 days later she started developing ulcerations in predominantly the right foot but also the left third toe. 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 This is never really healed rib. Repeat ABIs on 12/14 showed a right ABI of 1 and a left ABI of 0.93 with toe pressures be markedly abnormal as noted above and Dr. Marcie BalBritto's notes. The patient is still having a lot of pain making it very difficult for her to function at home this is worse on the right greater than left foot she does describe severe lancinating pain. She does not ambulate all that much and she finds it intolerable to wear footwear Her history is also notable for A. fib, severe cardiomyopathy with an ejection fraction of 25%_30%. Last echocardiogram in June of this year. According to Epic she is on a combination of Eliquis plavix and aspirin. 06/29/17; patient has an appointment with Dr. Imogene Burnhen of vascular surgery I believe  next week. This was arranged by her primary physician. She is still having a lot of pain in the right side in general most of her  toes look better however. Supposed to be using silver alginate however [facility] applying currently applied wet to dry dressings. 07/06/17; the patient went for appointment with Dr. Imogene Burn. She is status post right popliteal stenting with peroneal artery angioplasty with Dr. Gery Pray. She presented to our clinic with bilateral lower extremity wounds in her toes which are exceptionally painful. Dr. Imogene Burn put her on a course of antibiotics. Her arterial studies from 06/10/17 showed an ABI in the right of 1 and ABI in the left of 0.8. Waveforms at the dorsalis pedis and posterior tibial were biphasic. However routine ABIs were Lubbers, Hasna G. ([id number]) 0.31 bilaterally. The patient has severe bilateral rest pain. 07/20/17; the patient had her arteriogram by Dr. Imogene Burn on 07/14/17. Based on the images the patient was not felt to require any interventions and no further interventions were possible due to small size of the tibial arteries. The posterior tibial arteries bilaterally were occluded heel arteries were the dominant runoff which were miniscule. Anterior tibials both showed subtotal occlusion in the mid segment and these were small vessels. Paradoxically the patient complains of a lot less pain in the right foot however she is having a lot of pain in the left first and left third toes. She is managing this with activity limitation and narcotics Electronic Signature(s) Signed: 07/20/2017 5:00:32 PM By: Baltazar Najjar MD Entered By: Baltazar Najjar on 07/20/2017 12:34:43 [name] ([id number]) -------------------------------------------------------------------------------- Physical Exam Details Patient Name: [name] Loa Date of Service: 07/20/2017 10:15 AM Medical Record Number: [id number] Patient Account Number: [id number] Date of Birth/Sex: 25-Jun-1946 (71 y.o. Female) Treating RN: Renne Crigler Primary Care Provider: Quintin Alto Other Clinician: Referring Provider:  Quintin Alto Treating Provider/Extender: Altamese [place] in Treatment: 5 Constitutional Sitting or standing Blood Pressure is within target range for patient.. Pulse regular and within target range for patient.Marland Kitchen Respirations regular, non-labored and within target range.. Temperature is normal and within the target range for the patient.Marland Kitchen appears in no distressalthough she looks chronically uncomfortable. Eyes Conjunctivae clear. No discharge. Respiratory Respiratory effort is easy and symmetric bilaterally. Rate is normal at rest and on room air.. Cardiovascular Femoral arteries palpable.. Pedal pulses absent bilaterally.. the right forefoot is warm or worse a left is cold. Lymphatic popliteal areas have no notes. Psychiatric No evidence of depression, anxiety, or agitation. Calm, cooperative, and communicative. Appropriate interactions and affect.. Notes wound exam; the right foot is better. There is some loss of necrotic surface epithelium on some of her toes only to show underlying healthy epithelialization. The dry gangrene on the plantar first toe is essentially resolved. I removed a dead Of skin and subcutaneous tissue from the fourth toe. She still has an open area on the right third toe. oOn the left foot she has an ischemic area on the tip of the left great toe and the medial aspect of the left great toe. In most problematically a very painful necrotic tip on the left third toe. He any manipulation of the third toe is very problematic Electronic Signature(s) Signed: 07/20/2017 5:00:32 PM By: Baltazar Najjar MD Entered By: Baltazar Najjar on 07/20/2017 12:37:39 Dinges, Don [name] ([id number]) -------------------------------------------------------------------------------- Physician Orders Details Patient Name: [name] Loa Date of Service: 07/20/2017 10:15 AM Medical Record Number: [id number] Patient Account Number: [id number] Date of Birth/Sex: 16-Jan-1946 (71 y.o.  Female) Treating RN:  Renne Crigler Primary Care Provider: Quintin Alto Other Clinician: Referring Provider: Quintin Alto Treating Provider/Extender: Altamese [place] in Treatment: 5 Verbal / Phone Orders: No Diagnosis Coding Wound Cleansing Wound #1 Right,Distal Toe - Web between 2nd and 3rd o Clean wound with Normal Saline. Wound #2 Right Toe - Web between 3rd and 4th o Clean wound with Normal Saline. Wound #4 Right,Dorsal Toe Fifth o Clean wound with Normal Saline. Wound #5 Left,Distal Toe Great o Clean wound with Normal Saline. Wound #6 Left,Lateral Toe Great o Clean wound with Normal Saline. Wound #7 Left Toe Third o Clean wound with Normal Saline. Anesthetic (add to Medication List) Wound #1 Right,Distal Toe - Web between 2nd and 3rd o Topical Lidocaine 4% cream applied to wound bed prior to debridement (In Clinic Only). Wound #2 Right Toe - Web between 3rd and 4th o Topical Lidocaine 4% cream applied to wound bed prior to debridement (In Clinic Only). Wound #4 Right,Dorsal Toe Fifth o Topical Lidocaine 4% cream applied to wound bed prior to debridement (In Clinic Only). Wound #5 Left,Distal Toe Great o Topical Lidocaine 4% cream applied to wound bed prior to debridement (In Clinic Only). Wound #6 Left,Lateral Toe Great o Topical Lidocaine 4% cream applied to wound bed prior to debridement (In Clinic Only). Wound #7 Left Toe Third o Topical Lidocaine 4% cream applied to wound bed prior to debridement (In Clinic Only). Primary Wound Dressing Wound #1 Right,Distal Toe - Web between 2nd and 3rd o Other: - silvercell between toes down to base of web and cover wounds with silvercell strip Wound #2 Right Toe - Web between 3rd and 4th o Other: - silvercell between toes down to base of web between toes Offutt, Terrisa G. ([id number]) Wound #4 Right,Dorsal Toe Fifth o Other: - silvercell between toes down to base of web between  toes Wound #5 Left,Distal Toe Great o Other: - silvercell between toes down to base of web between toes Wound #6 Left,Lateral Toe Great o Other: - silvercell between toes down to base of web between toes Wound #7 Left Toe Third o Other: - silvercell between toes down to base of web between toes Secondary Dressing Wound #1 Right,Distal Toe - Web between 2nd and 3rd o ABD pad Wound #2 Right Toe - Web between 3rd and 4th o ABD pad Wound #4 Right,Dorsal Toe Fifth o ABD pad Wound #5 Left,Distal Toe Great o ABD pad Wound #6 Left,Lateral Toe Great o ABD pad Wound #7 Left Toe Third o ABD pad Dressing Change Frequency Wound #1 Right,Distal Toe - Web between 2nd and 3rd o Change dressing every day. Wound #2 Right Toe - Web between 3rd and 4th o Change dressing every day. Wound #4 Right,Dorsal Toe Fifth o Change dressing every day. Wound #5 Left,Distal Toe Great o Change dressing every day. Wound #6 Left,Lateral Toe Great o Change dressing every day. Wound #7 Left Toe Third o Change dressing every day. Follow-up Appointments Wound #1 Right,Distal Toe - Web between 2nd and 3rd o Return Appointment in 1 week. Wound #2 Right Toe - Web between 3rd and 4th Raya, [place]. ([id number]) o Return Appointment in 1 week. Wound #4 Right,Dorsal Toe Fifth o Return Appointment in 1 week. Wound #5 Left,Distal Toe Great o Return Appointment in 1 week. Wound #6 Left,Lateral Toe Great o Return Appointment in 1 week. Wound #7 Left Toe Third o Return Appointment in 1 week. Home Health Wound #1 Right,Distal Toe - Web between 2nd and 3rd o  Continue Home Health Visits o Home Health Nurse may visit PRN to address patientos wound care needs. o FACE TO FACE ENCOUNTER: MEDICARE and MEDICAID PATIENTS: I certify that this patient is under my care and that I had a face-to-face encounter that meets the physician face-to-face encounter requirements with  this patient on this date. The encounter with the patient was in whole or in part for the following MEDICAL CONDITION: (primary reason for Home Healthcare) MEDICAL NECESSITY: I certify, that based on my findings, NURSING services are a medically necessary home health service. HOME BOUND STATUS: I certify that my clinical findings support that this patient is homebound (i.e., Due to illness or injury, pt requires aid of supportive devices such as crutches, cane, wheelchairs, walkers, the use of special transportation or the assistance of another person to leave their place of residence. There is a normal inability to leave the home and doing so requires considerable and taxing effort. Other absences are for medical reasons / religious services and are infrequent or of short duration when for other reasons). o If current dressing causes regression in wound condition, may D/C ordered dressing product/s and apply Normal Saline Moist Dressing daily until next Wound Healing Center / Other MD appointment. Notify Wound Healing Center of regression in wound condition at [phone]. o Please direct any NON-WOUND related issues/requests for orders to patient's Primary Care Physician Wound #2 Right Toe - Web between 3rd and 4th o Continue Home Health Visits o Home Health Nurse may visit PRN to address patientos wound care needs. o FACE TO FACE ENCOUNTER: MEDICARE and MEDICAID PATIENTS: I certify that this patient is under my care and that I had a face-to-face encounter that meets the physician face-to-face encounter requirements with this patient on this date. The encounter with the patient was in whole or in part for the following MEDICAL CONDITION: (primary reason for Home Healthcare) MEDICAL NECESSITY: I certify, that based on my findings, NURSING services are a medically necessary home health service. HOME BOUND STATUS: I certify that my clinical findings support that this patient is  homebound (i.e., Due to illness or injury, pt requires aid of supportive devices such as crutches, cane, wheelchairs, walkers, the use of special transportation or the assistance of another person to leave their place of residence. There is a normal inability to leave the home and doing so requires considerable and taxing effort. Other absences are for medical reasons / religious services and are infrequent or of short duration when for other reasons). o If current dressing causes regression in wound condition, may D/C ordered dressing product/s and apply Normal Saline Moist Dressing daily until next Wound Healing Center / Other MD appointment. Notify Wound Healing Center of regression in wound condition at [phone]. o Please direct any NON-WOUND related issues/requests for orders to patient's Primary Care Physician Wound #4 Right,Dorsal Toe Fifth o Continue Home Health Visits o Home Health Nurse may visit PRN to address patientos wound care needs. o FACE TO FACE ENCOUNTER: MEDICARE and MEDICAID PATIENTS: I certify that this patient is under my care and that I had a face-to-face encounter that meets the physician face-to-face encounter requirements with this patient on this date. The encounter with the patient was in whole or in part for the following MEDICAL CONDITION: (primary reason for Home Healthcare) MEDICAL NECESSITY: I certify, that based on my findings, GAY, MONCIVAIS ([id number]) NURSING services are a medically necessary home health service. HOME BOUND STATUS: I certify that my clinical findings support that  this patient is homebound (i.e., Due to illness or injury, pt requires aid of supportive devices such as crutches, cane, wheelchairs, walkers, the use of special transportation or the assistance of another person to leave their place of residence. There is a normal inability to leave the home and doing so requires considerable and taxing effort. Other absences are  for medical reasons / religious services and are infrequent or of short duration when for other reasons). o If current dressing causes regression in wound condition, may D/C ordered dressing product/s and apply Normal Saline Moist Dressing daily until next Wound Healing Center / Other MD appointment. Notify Wound Healing Center of regression in wound condition at [phone]. o Please direct any NON-WOUND related issues/requests for orders to patient's Primary Care Physician Wound #5 Left,Distal Toe Doretha Sou Continue Home Health Visits o Home Health Nurse may visit PRN to address patientos wound care needs. o FACE TO FACE ENCOUNTER: MEDICARE and MEDICAID PATIENTS: I certify that this patient is under my care and that I had a face-to-face encounter that meets the physician face-to-face encounter requirements with this patient on this date. The encounter with the patient was in whole or in part for the following MEDICAL CONDITION: (primary reason for Home Healthcare) MEDICAL NECESSITY: I certify, that based on my findings, NURSING services are a medically necessary home health service. HOME BOUND STATUS: I certify that my clinical findings support that this patient is homebound (i.e., Due to illness or injury, pt requires aid of supportive devices such as crutches, cane, wheelchairs, walkers, the use of special transportation or the assistance of another person to leave their place of residence. There is a normal inability to leave the home and doing so requires considerable and taxing effort. Other absences are for medical reasons / religious services and are infrequent or of short duration when for other reasons). o If current dressing causes regression in wound condition, may D/C ordered dressing product/s and apply Normal Saline Moist Dressing daily until next Wound Healing Center / Other MD appointment. Notify Wound Healing Center of regression in wound condition at  [phone]. o Please direct any NON-WOUND related issues/requests for orders to patient's Primary Care Physician Wound #6 Left,Lateral Toe Doretha Sou Continue Home Health Visits o Home Health Nurse may visit PRN to address patientos wound care needs. o FACE TO FACE ENCOUNTER: MEDICARE and MEDICAID PATIENTS: I certify that this patient is under my care and that I had a face-to-face encounter that meets the physician face-to-face encounter requirements with this patient on this date. The encounter with the patient was in whole or in part for the following MEDICAL CONDITION: (primary reason for Home Healthcare) MEDICAL NECESSITY: I certify, that based on my findings, NURSING services are a medically necessary home health service. HOME BOUND STATUS: I certify that my clinical findings support that this patient is homebound (i.e., Due to illness or injury, pt requires aid of supportive devices such as crutches, cane, wheelchairs, walkers, the use of special transportation or the assistance of another person to leave their place of residence. There is a normal inability to leave the home and doing so requires considerable and taxing effort. Other absences are for medical reasons / religious services and are infrequent or of short duration when for other reasons). o If current dressing causes regression in wound condition, may D/C ordered dressing product/s and apply Normal Saline Moist Dressing daily until next Wound Healing Center / Other MD appointment. Notify Wound Healing Center of regression in wound  condition at [phone]. o Please direct any NON-WOUND related issues/requests for orders to patient's Primary Care Physician Wound #7 Left Toe Third o Continue Home Health Visits o Home Health Nurse may visit PRN to address patientos wound care needs. o FACE TO FACE ENCOUNTER: MEDICARE and MEDICAID PATIENTS: I certify that this patient is under my care and that I had a  face-to-face encounter that meets the physician face-to-face encounter requirements with this patient on this date. The encounter with the patient was in whole or in part for the following MEDICAL CONDITION: (primary reason for Home Healthcare) MEDICAL NECESSITY: I certify, that based on my findings, NURSING services are a medically necessary home health service. HOME BOUND STATUS: I certify that my clinical findings support that this patient is homebound (i.e., Due to illness or injury, pt requires aid of supportive devices such as crutches, cane, wheelchairs, walkers, the use of special transportation or the assistance of another person to leave their place of residence. There is a normal inability to leave the home [facility], Jacklyne G. ([id number]) and doing so requires considerable and taxing effort. Other absences are for medical reasons / religious services and are infrequent or of short duration when for other reasons). o If current dressing causes regression in wound condition, may D/C ordered dressing product/s and apply Normal Saline Moist Dressing daily until next Wound Healing Center / Other MD appointment. Notify Wound Healing Center of regression in wound condition at [phone]. o Please direct any NON-WOUND related issues/requests for orders to patient's Primary Care Physician Consults o Cardiology - Dr. Leanord Hawking will contact Dr. Allyson Sabal to discuss evaluation of patients left lower leg due to non healing wounds on left great toe and third toe head of left foot Electronic Signature(s) Signed: 07/20/2017 4:36:13 PM By: Renne Crigler Signed: 07/20/2017 5:00:32 PM By: Baltazar Najjar MD Entered By: Renne Crigler on 07/20/2017 11:45:47 Berendt, Don [name] ([id number]) -------------------------------------------------------------------------------- Problem List Details Patient Name: [name] Loa Date of Service: 07/20/2017 10:15 AM Medical Record Number: [id number] Patient  Account Number: [id number] Date of Birth/Sex: 1946/02/04 (71 y.o. 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 Female) Treating RN: Renne Crigler Primary Care Provider: Quintin Alto Other Clinician: Referring Provider: Quintin Alto Treating Provider/Extender: Altamese [place] in Treatment: 5 Subjective History of Present Illness (HPI) The following HPI elements were documented for the patient's wound: Location: right foot toes Quality: Patient reports experiencing a sharp pain to affected area(s). Severity: Patient states wound are getting worse. Duration: Patient has had the wound for < 2 weeks prior to presenting for treatment Timing: Pain in wound is constant (hurts all the time) Context: The wound appeared gradually over time Modifying Factors: Other treatment(s) tried include:recent interventional process to place a stent in her  right lower  extremity for critical limb ischemia Associated Signs and Symptoms: Patient reports having foul odor. 72 year old female who was referred by Dr. Nanetta Batty, who performed a endovascular procedure on 05/26/2017, for critical limb ischemia. Her angiogram showed a 99% below the knee popliteal stenosis which is stented with a 4 mm drug eluting stent. On 06/10/2017 he noted that her pulses are palpable and her Dopplers have normalized. He referred her to wound care for further advice and put her on Keflex 500 mg twice daily for 10 days. She has a past medical history of asthma, coronary artery disease, CHF, hyperlipidemia, ischemic cardiomyopathy, peripheral arterial disease and type 2 diabetes mellitus. She is status post recent abdominal aortogram with right lower extremity stenting, cholecystectomy, CABG, parathyroidectomy, thyroidectomy. She is a former smoker and quit in 1988. 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 The right ABI was 1.0 and biphasic and the left ABI was 0.93 and biphasic. Toe pressures were abnormal with the right being 0.27 and left being 0.31. the duplex study done showed a patent right popliteal artery stent without evidence of focal stenosis or obstruction. of note the patient started having signs of critical limb ischemia somewhere at the end of November and after an urgent workup was taken up for limb salvage with a successfully placed stent by Dr. Nanetta Batty, with good arterial perfusion postprocedure with normal ABIs noted on 06/10/2017. 06/22/17; patient was admitted to our clinic last week by Dr. Meyer Russel.she is a type II diabetic. She had undergone a right popliteal DES on 11/29 for critical limb ischemia including erythema and pain of her right forefoot. She states about 3 days later she started developing ulcerations in predominantly the right foot but also the  left third toe. 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 This is never really healed rib. Repeat ABIs on 12/14 showed a right ABI of 1 and a left ABI of 0.93 with toe pressures be markedly abnormal as noted above and Dr. Marcie Bal notes. The patient is still having a lot of pain making it very difficult for her to function at home this is worse on the right greater than left foot she does describe severe lancinating pain. She does not ambulate all that much and she finds it intolerable to wear footwear Her history is also notable for A. fib, severe cardiomyopathy with an ejection fraction of 25%_30%. Last echocardiogram in June of this year. According to Epic she is on a combination of Eliquis plavix and aspirin. 06/29/17; patient has an appointment with Dr. Imogene Burn of vascular surgery I believe next week. This was arranged by her primary physician. She is still having a lot of pain in the right side in general most of her toes look better however. Supposed to be using silver alginate however [facility] applying currently applied wet to dry dressings. 07/06/17; the patient went for appointment with Dr. Imogene Burn. She is status post right popliteal stenting with peroneal artery angioplasty with Dr. Gery Pray. She presented to our clinic with bilateral lower extremity wounds in her toes which are exceptionally painful. Dr. Imogene Burn put her on a course of antibiotics. Her arterial studies from 06/10/17 showed an ABI in the right of 1 and ABI in the left of 0.8. Waveforms at the dorsalis pedis and posterior tibial were biphasic. However routine ABIs were Fornes, Gracelyn G. (  [id number]) 0.31 bilaterally. The patient has severe bilateral rest pain. 07/20/17; the patient had her arteriogram by Dr. Imogene Burn on 07/14/17. Based on the images the patient was not felt to require any interventions and no further interventions were possible due to small size of the tibial arteries. The  posterior tibial arteries bilaterally were occluded heel arteries were the dominant runoff which were miniscule. Anterior tibials both showed subtotal occlusion in the mid segment and these were small vessels. Paradoxically the patient complains of a lot less pain in the right foot however she is having a lot of pain in the left first and left third toes. She is managing this with activity limitation and narcotics Objective Constitutional Sitting or standing Blood Pressure is within target range for patient.. Pulse regular and within target range for patient.Marland Kitchen Respirations regular, non-labored and within target range.. Temperature is normal and within the target range for the patient.Marland Kitchen appears in no distressalthough she looks chronically uncomfortable. Vitals Time Taken: 10:19 AM, Height: 63 in, Weight: 176 lbs, BMI: 31.2, Temperature: 98.1 F, Pulse: 85 bpm, Respiratory Rate: 16 breaths/min, Blood Pressure: 109/69 mmHg. Eyes Conjunctivae clear. No discharge. Respiratory Respiratory effort is easy and symmetric bilaterally. Rate is normal at rest and on room air.. Cardiovascular Femoral arteries palpable.. Pedal pulses absent bilaterally.. the right forefoot is warm or worse a left is cold. Lymphatic popliteal areas have no notes. Psychiatric No evidence of depression, anxiety, or agitation. Calm, cooperative, and communicative. Appropriate interactions and affect.. General Notes: wound exam; the right foot is better. There is some loss of necrotic surface epithelium on some of her toes only to show underlying healthy epithelialization. The dry gangrene on the plantar first toe is essentially resolved. I removed a dead Of skin and subcutaneous tissue from the fourth toe. She still has an open area on the right third toe. On the left foot she has an ischemic area on the tip of the left great toe and the medial aspect of the left great toe. In most problematically a very painful necrotic tip  on the left third toe. He any manipulation of the third toe is very problematic Integumentary (Hair, Skin) Wound #1 status is Open. Original cause of wound was Gradually Appeared. The wound is located on the Right,Distal Toe - Web between 2nd and 3rd. The wound measures 0.8cm length x 0.6cm width x 0.1cm depth; 0.377cm^2 area and 0.038cm^3 volume. Wound #2 status is Open. Original cause of wound was Pressure Injury. The wound is located on the Right Toe - Web between 3rd and 4th. The wound measures 0.5cm length x 0.5cm width x 0.1cm depth; 0.196cm^2 area and 0.02cm^3 volume. Wound #3 status is Healed - Epithelialized. Original cause of wound was Gradually Appeared. The wound is located on the [facility], DEBIE ASHLINE. ([id number]) Right Toe - Web between 4th and 5th. The wound measures 0cm length x 0cm width x 0cm depth; 0cm^2 area and 0cm^3 volume. Wound #4 status is Open. Original cause of wound was Gradually Appeared. The wound is located on the Right,Dorsal Toe Fifth. The wound measures 0.2cm length x 0.2cm width x 0.1cm depth; 0.031cm^2 area and 0.003cm^3 volume. There is no tunneling or undermining noted. There is a small amount of serous drainage noted. The wound margin is flat and intact. There is large (67-100%) pink granulation within the wound bed. There is no necrotic tissue within the wound bed. The periwound skin appearance did not exhibit: Callus, Crepitus, Excoriation, Induration, Rash, Scarring, Dry/Scaly, Maceration, Atrophie Blanche, Cyanosis,  Ecchymosis, Hemosiderin Staining, Mottled, Pallor, Rubor, Erythema. Wound #5 status is Open. Original cause of wound was Gradually Appeared. The wound is located on the [employer]. The wound measures 2cm length x 1cm width x 0.01cm depth; 1.571cm^2 area and 0.016cm^3 volume. There is no tunneling or undermining noted. There is a none present amount of drainage noted. The wound margin is flat and intact. There is no granulation within  the wound bed. There is a large (67-100%) amount of necrotic tissue within the wound bed including Eschar. The periwound skin appearance exhibited: Callus, Induration, Scarring. The periwound skin appearance did not exhibit: Crepitus, Excoriation, Rash, Dry/Scaly, Maceration, Atrophie Blanche, Cyanosis, Ecchymosis, Hemosiderin Staining, Mottled, Pallor, Rubor, Erythema. Periwound temperature was noted as No Abnormality. The periwound has tenderness on palpation. Wound #6 status is Open. Original cause of wound was Gradually Appeared. The wound is located on the [employer]. The wound measures 1.8cm length x 2.2cm width x 0.1cm depth; 3.11cm^2 area and 0.311cm^3 volume. There is no tunneling or undermining noted. There is a medium amount of serosanguineous drainage noted. Foul odor after cleansing was noted. The wound margin is distinct with the outline attached to the wound base. There is small (1-33%) pink granulation within the wound bed. There is a large (67-100%) amount of necrotic tissue within the wound bed including Eschar and Adherent Slough. The periwound skin appearance exhibited: Callus, Crepitus, Induration. The periwound skin appearance did not exhibit: Excoriation, Rash, Scarring, Dry/Scaly, Maceration, Atrophie Blanche, Cyanosis, Ecchymosis, Hemosiderin Staining, Mottled, Pallor, Rubor, Erythema. Periwound temperature was noted as No Abnormality. The periwound has tenderness on palpation. Wound #7 status is Open. Original cause of wound was Gradually Appeared. The wound is located on the Left Toe Third. The wound measures 1.2cm length x 0.8cm width x 0.1cm depth; 0.754cm^2 area and 0.075cm^3 volume. There is no tunneling or undermining noted. There is a none present amount of drainage noted. The wound margin is distinct with the outline attached to the wound base. There is no granulation within the wound bed. There is a large (67-100%) amount of necrotic tissue within the  wound bed including Eschar. The periwound skin appearance exhibited: Callus. The periwound skin appearance did not exhibit: Crepitus, Excoriation, Induration, Rash, Scarring, Dry/Scaly, Maceration, Atrophie Blanche, Cyanosis, Ecchymosis, Hemosiderin Staining, Mottled, Pallor, Rubor, Erythema. Periwound temperature was noted as No Abnormality. The periwound has tenderness on palpation. 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 ([id number]) Wound #1 Right,Distal Toe - Web between 2nd and 3rd: Clean wound with Normal Saline. Wound #2 Right Toe - Web between 3rd and 4th: Clean wound with Normal Saline. Wound #4 Right,Dorsal Toe Fifth: Clean wound with Normal Saline. Wound #5 Left,Distal Toe Great: Clean wound with Normal Saline. Wound #6 Left,Lateral Toe Great: Clean wound with Normal Saline. Wound #7 Left Toe Third: Clean wound with Normal Saline. Anesthetic (add to Medication List): Wound #1 Right,Distal Toe - Web between 2nd and 3rd: Topical Lidocaine 4% cream applied to wound bed prior to debridement (In Clinic Only). Wound #2 Right Toe - Web between 3rd and 4th: Topical Lidocaine 4% cream applied to wound bed prior to debridement (In Clinic Only). Wound #4 Right,Dorsal Toe Fifth: Topical Lidocaine 4% cream applied to  wound bed prior to debridement (In Clinic Only). Wound #5 Left,Distal Toe Great: Topical Lidocaine 4% cream applied to wound bed prior to debridement (In Clinic Only). Wound #6 Left,Lateral Toe Great: Topical Lidocaine 4% cream applied to wound bed prior to debridement (In Clinic Only). Wound #7 Left Toe Third: Topical Lidocaine 4% cream applied to wound  bed prior to debridement (In Clinic Only). Primary Wound Dressing: Wound #1 Right,Distal Toe - Web between 2nd and 3rd: Other: - silvercell between toes down to base of web and cover wounds with silvercell strip Wound #2 Right Toe - Web between 3rd and 4th: Other: - silvercell between toes down to base of web between toes Wound #4 Right,Dorsal Toe Fifth: Other: - silvercell between toes down to base of web between toes Wound #5 Left,Distal Toe Great: Other: - silvercell between toes down to base of web between toes Wound #6 Left,Lateral Toe Great: Other: - silvercell between toes down to base of web between toes Wound #7 Left Toe Third: Other: - silvercell between toes down to base of web between toes Secondary Dressing: Wound #1 Right,Distal Toe - Web between 2nd and 3rd: ABD pad Wound #2 Right Toe - Web between 3rd and 4th: ABD pad Wound #4 Right,Dorsal Toe Fifth: ABD pad Wound #5 Left,Distal Toe Great: ABD pad Wound #6 Left,Lateral Toe Great: ABD pad Wound #7 Left Toe Third: ABD pad Dressing Change Frequency: Wound #1 Right,Distal Toe - Web between 2nd and 3rd: Change dressing every day. Wound #2 Right Toe - Web between 3rd and 4th: Change dressing every day. Wound #4 Right,Dorsal Toe Fifth: Change dressing every day. Wound #5 Left,Distal Toe Great: Change dressing every day. Lobue, Kathye GMarland Kitchen ([id number]) Wound #6 Left,Lateral Toe Great: Change dressing every day. Wound #7 Left Toe Third: Change dressing every day. Follow-up Appointments: Wound #1 Right,Distal Toe - Web between 2nd and 3rd: Return Appointment in 1 week. Wound #2 Right Toe - Web between 3rd and 4th: Return Appointment in 1 week. Wound #4 Right,Dorsal Toe Fifth: Return Appointment in 1 week. Wound #5 Left,Distal Toe Great: Return Appointment in 1 week. Wound #6 Left,Lateral Toe Great: Return Appointment in 1 week. Wound #7 Left Toe Third: Return Appointment in 1 week. Home Health: Wound #1  Right,Distal Toe - Web between 2nd and 3rd: Continue Home Health Visits Home Health Nurse may visit PRN to address patient s wound care needs. FACE TO FACE ENCOUNTER: MEDICARE and MEDICAID PATIENTS: I certify that this patient is under my care and that I had a face-to-face encounter that meets the physician face-to-face encounter requirements with this patient on this date. The encounter with the patient was in whole or in part for the following MEDICAL CONDITION: (primary reason for Home Healthcare) MEDICAL NECESSITY: I certify, that based on my findings, NURSING services are a medically necessary home health service. HOME BOUND STATUS: I certify that my clinical findings support that this patient is homebound (i.e., Due to illness or injury, pt requires aid of supportive devices such as crutches, cane, wheelchairs, walkers, the use of special transportation or the assistance of another person to leave their place of residence. There is a normal inability to leave the home and doing so requires considerable and taxing effort. Other absences are for medical reasons / religious services and are infrequent or of short duration when for other reasons). If current dressing causes regression in wound condition, may D/C ordered dressing product/s and apply Normal Saline Moist Dressing daily until next Wound Healing Center /  Other MD appointment. Notify Wound Healing Center of regression in wound condition at [phone]. Please direct any NON-WOUND related issues/requests for orders to patient's Primary Care Physician Wound #2 Right Toe - Web between 3rd and 4th: Continue Home Health Visits Home Health Nurse may visit PRN to address patient s wound care needs. FACE TO FACE ENCOUNTER: MEDICARE and MEDICAID PATIENTS: I certify that this patient is under my care and that I had a face-to-face encounter that meets the physician face-to-face encounter requirements with this patient on this date.  The encounter with the patient was in whole or in part for the following MEDICAL CONDITION: (primary reason for Home Healthcare) MEDICAL NECESSITY: I certify, that based on my findings, NURSING services are a medically necessary home health service. HOME BOUND STATUS: I certify that my clinical findings support that this patient is homebound (i.e., Due to illness or injury, pt requires aid of supportive devices such as crutches, cane, wheelchairs, walkers, the use of special transportation or the assistance of another person to leave their place of residence. There is a normal inability to leave the home and doing so requires considerable and taxing effort. Other absences are for medical reasons / religious services and are infrequent or of short duration when for other reasons). If current dressing causes regression in wound condition, may D/C ordered dressing product/s and apply Normal Saline Moist Dressing daily until next Wound Healing Center / Other MD appointment. Notify Wound Healing Center of regression in wound condition at [phone]. Please direct any NON-WOUND related issues/requests for orders to patient's Primary Care Physician Wound #4 Right,Dorsal Toe Fifth: Continue Home Health Visits Home Health Nurse may visit PRN to address patient s wound care needs. FACE TO FACE ENCOUNTER: MEDICARE and MEDICAID PATIENTS: I certify that this patient is under my care and that I had a face-to-face encounter that meets the physician face-to-face encounter requirements with this patient on this date. The encounter with the patient was in whole or in part for the following MEDICAL CONDITION: (primary reason for Home Healthcare) MEDICAL NECESSITY: I certify, that based on my findings, NURSING services are a medically necessary home health service. HOME BOUND STATUS: I certify that my clinical findings support that this patient is homebound (i.e., Due to illness or injury, pt requires aid of  supportive devices such as crutches, cane, wheelchairs, walkers, the use of special transportation or the assistance of another person to leave their place of residence. There is a normal inability to leave the [facility], [place] G. ([id number]) home and doing so requires considerable and taxing effort. Other absences are for medical reasons / religious services and are infrequent or of short duration when for other reasons). If current dressing causes regression in wound condition, may D/C ordered dressing product/s and apply Normal Saline Moist Dressing daily until next Wound Healing Center / Other MD appointment. Notify Wound Healing Center of regression in wound condition at [phone]. Please direct any NON-WOUND related issues/requests for orders to patient's Primary Care Physician Wound #5 Left,Distal Toe Great: Continue Home Health Visits Home Health Nurse may visit PRN to address patient s wound care needs. FACE TO FACE ENCOUNTER: MEDICARE and MEDICAID PATIENTS: I certify that this patient is under my care and that I had a face-to-face encounter that meets the physician face-to-face encounter requirements with this patient on this date. The encounter with the patient was in whole or in part for the following MEDICAL CONDITION: (primary reason for Home Healthcare) MEDICAL NECESSITY: I certify, that based  on my findings, NURSING services are a medically necessary home health service. HOME BOUND STATUS: I certify that my clinical findings support that this patient is homebound (i.e., Due to illness or injury, pt requires aid of supportive devices such as crutches, cane, wheelchairs, walkers, the use of special transportation or the assistance of another person to leave their place of residence. There is a normal inability to leave the home and doing so requires considerable and taxing effort. Other absences are for medical reasons / religious services and are infrequent or of short duration  when for other reasons). If current dressing causes regression in wound condition, may D/C ordered dressing product/s and apply Normal Saline Moist Dressing daily until next Wound Healing Center / Other MD appointment. Notify Wound Healing Center of regression in wound condition at [phone]. Please direct any NON-WOUND related issues/requests for orders to patient's Primary Care Physician Wound #6 Left,Lateral Toe Great: Continue Home Health Visits Home Health Nurse may visit PRN to address patient s wound care needs. FACE TO FACE ENCOUNTER: MEDICARE and MEDICAID PATIENTS: I certify that this patient is under my care and that I had a face-to-face encounter that meets the physician face-to-face encounter requirements with this patient on this date. The encounter with the patient was in whole or in part for the following MEDICAL CONDITION: (primary reason for Home Healthcare) MEDICAL NECESSITY: I certify, that based on my findings, NURSING services are a medically necessary home health service. HOME BOUND STATUS: I certify that my clinical findings support that this patient is homebound (i.e., Due to illness or injury, pt requires aid of supportive devices such as crutches, cane, wheelchairs, walkers, the use of special transportation or the assistance of another person to leave their place of residence. There is a normal inability to leave the home and doing so requires considerable and taxing effort. Other absences are for medical reasons / religious services and are infrequent or of short duration when for other reasons). If current dressing causes regression in wound condition, may D/C ordered dressing product/s and apply Normal Saline Moist Dressing daily until next Wound Healing Center / Other MD appointment. Notify Wound Healing Center of regression in wound condition at [phone]. Please direct any NON-WOUND related issues/requests for orders to patient's Primary Care  Physician Wound #7 Left Toe Third: Continue Home Health Visits Home Health Nurse may visit PRN to address patient s wound care needs. FACE TO FACE ENCOUNTER: MEDICARE and MEDICAID PATIENTS: I certify that this patient is under my care and that I had a face-to-face encounter that meets the physician face-to-face encounter requirements with this patient on this date. The encounter with the patient was in whole or in part for the following MEDICAL CONDITION: (primary reason for Home Healthcare) MEDICAL NECESSITY: I certify, that based on my findings, NURSING services are a medically necessary home health service. HOME BOUND STATUS: I certify that my clinical findings support that this patient is homebound (i.e., Due to illness or injury, pt requires aid of supportive devices such as crutches, cane, wheelchairs, walkers, the use of special transportation or the assistance of another person to leave their place of residence. There is a normal inability to leave the home and doing so requires considerable and taxing effort. Other absences are for medical reasons / religious services and are infrequent or of short duration when for other reasons). If current dressing causes regression in wound condition, may D/C ordered dressing product/s and apply Normal Saline Moist Dressing daily until next Wound  Healing Center / Other MD appointment. Notify Wound Healing Center of regression in wound condition at [phone]. Please direct any NON-WOUND related issues/requests for orders to patient's Primary Care Physician Consults ordered were: Cardiology - Dr. Leanord Hawking will contact Dr. Allyson Sabal to discuss evaluation of patients left lower leg due to non healing wounds on left great toe and third toe head of left foot Santizo, Makenzey G. ([id number]) #1 all remaining wound areas to be dressed with normal saline and gauze #2 all last Dr. Allyson Sabal to look at the left lower extremity angiogram with regards to anything further  that can be done and a comment on where an amputation could be done if this is necessary #3 she has had surprising improvement in the right foot. Thing that remains here is the medial right third toe in the nailbed of the fourth toe Electronic Signature(s) Signed: 07/20/2017 5:00:32 PM By: Baltazar Najjar MD Entered By: Baltazar Najjar on 07/20/2017 12:38:42 Engram, Don [name] ([id number]) -------------------------------------------------------------------------------- SuperBill Details Patient Name: [name] Loa Date of Service: 07/20/2017 Medical Record Number: [id number] Patient Account Number: [id number] Date of Birth/Sex: October 02, 1945 (72 y.o. 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 Atherosclerosis of nonbiological bypass graft(s) of the extremities with gangrene, right leg I70.245 Atherosclerosis of native arteries of left leg with ulceration of other part of foot Facility Procedures CPT4 Code: [id number] Description: 53664 - WOUND CARE VISIT-LEV 5 EST PT Modifier: Quantity: 1 Physician Procedures CPT4 Code Description: [id number] 59563 - WC PHYS LEVEL 3 - EST PT ICD-10 Diagnosis Description I70.235 Atherosclerosis of native arteries of right leg with ulceration o I70.245 Atherosclerosis of native arteries of left leg with ulceration of Modifier: f other part of fo other part of foo Quantity: 1 ot t Electronic Signature(s) Signed: 07/20/2017 5:00:32 PM By: Baltazar Najjar MD Entered By: Baltazar Najjar on 07/20/2017 12:40:08

## 2017-07-22 NOTE — Progress Notes (Signed)
[name], [name] ([id number]) Visit Report for 07/20/2017 Arrival Information Details Patient Name: [name], [name] Date of Service: 07/20/2017 10:15 AM Medical Record Number: [id number] Patient Account Number: [id number] Date of Birth/Sex: 1945-10-11 (72 y.o. Female) Treating RN: Renne Crigler Primary Care Yobana Culliton: Quintin Alto Other Clinician: Referring Roanna Reaves: Quintin Alto Treating Heavenlee Maiorana/Extender: Altamese [place] in Treatment: 5 Visit Information History Since Last Visit All ordered tests and consults were completed: Yes Patient Arrived: Wheel Chair Added or deleted any medications: No Arrival Time: 10:16 Any new allergies or adverse reactions: No Accompanied By: husband Had a fall or experienced change in No Transfer Assistance: EasyPivot Patient activities of daily living that may affect Lift risk of falls: Patient Identification Verified: Yes Signs or symptoms of abuse/neglect since last visito No Secondary Verification Process Yes Hospitalized since last visit: No Completed: Pain Present Now: Yes Patient Requires Transmission-Based No Precautions: Patient Has Alerts: Yes Patient Alerts: Patient on Blood Thinner Prediabetic Eliquis Electronic Signature(s) Signed: 07/20/2017 4:36:13 PM By: Renne Crigler Entered By: Renne Crigler on 07/20/2017 10:18:31 [name], [name] ([id number]) -------------------------------------------------------------------------------- Clinic Level of Care Assessment Details Patient Name: [name] Date of Service: 07/20/2017 10:15 AM Medical Record Number: [id number] Patient Account Number: [id number] Date of Birth/Sex: [birth year] (71 y.o. Female) Treating RN: Renne Crigler Primary Care Rumi Taras: Quintin Alto Other Clinician: Referring Niyana Chesbro: Quintin Alto Treating Fred Hammes/Extender: Altamese [place] in Treatment: 5 Clinic Level of Care Assessment Items TOOL 4 Quantity Score []  - Use  when only an EandM is performed on FOLLOW-UP visit 0 ASSESSMENTS - Nursing Assessment / Reassessment []  - Reassessment of Co-morbidities (includes updates in patient status) 0 X- 1 5 Reassessment of Adherence to Treatment Plan ASSESSMENTS - Wound and Skin Assessment / Reassessment []  - Simple Wound Assessment / Reassessment - one wound 0 X- 6 5 Complex Wound Assessment / Reassessment - multiple wounds []  - 0 Dermatologic / Skin Assessment (not related to wound area) ASSESSMENTS - Focused Assessment []  - Circumferential Edema Measurements - multi extremities 0 []  - 0 Nutritional Assessment / Counseling / Intervention []  - 0 Lower Extremity Assessment (monofilament, tuning fork, pulses) []  - 0 Peripheral Arterial Disease Assessment (using hand held doppler) ASSESSMENTS - Ostomy and/or Continence Assessment and Care []  - Incontinence Assessment and Management 0 []  - 0 Ostomy Care Assessment and Management (repouching, etc.) PROCESS - Coordination of Care []  - Simple Patient / Family Education for ongoing care 0 X- 1 20 Complex (extensive) Patient / Family Education for ongoing care []  - 0 Staff obtains Chiropractor, Records, Test Results / Process Orders []  - 0 Staff telephones HHA, Nursing Homes / Clarify orders / etc []  - 0 Routine Transfer to another Facility (non-emergent condition) []  - 0 Routine Hospital Admission (non-emergent condition) []  - 0 New Admissions / Manufacturing engineer / Ordering NPWT, Apligraf, etc. []  - 0 Emergency Hospital Admission (emergent condition) []  - 0 Simple Discharge Coordination Keep, Keeli G. ([id number]) X- 1 15 Complex (extensive) Discharge Coordination PROCESS - Special Needs []  - Pediatric / Minor Patient Management 0 []  - 0 Isolation Patient Management []  - 0 Hearing / Language / Visual special needs []  - 0 Assessment of Community assistance (transportation, D/C planning, etc.) []  - 0 Additional assistance / Altered  mentation []  - 0 Support Surface(s) Assessment (bed, cushion, seat, etc.) INTERVENTIONS - Wound Cleansing / Measurement []  - Simple Wound Cleansing - one wound 0 X- 6 5 Complex Wound Cleansing - multiple wounds X- 1 5 Wound Imaging (photographs -  any number of wounds) []  - 0 Wound Tracing (instead of photographs) []  - 0 Simple Wound Measurement - one wound X- 6 5 Complex Wound Measurement - multiple wounds INTERVENTIONS - Wound Dressings X - Small Wound Dressing one or multiple wounds 6 10 []  - 0 Medium Wound Dressing one or multiple wounds []  - 0 Large Wound Dressing one or multiple wounds []  - 0 Application of Medications - topical []  - 0 Application of Medications - injection INTERVENTIONS - Miscellaneous []  - External ear exam 0 []  - 0 Specimen Collection (cultures, biopsies, blood, body fluids, etc.) []  - 0 Specimen(s) / Culture(s) sent or taken to Lab for analysis []  - 0 Patient Transfer (multiple staff / Nurse, adult / Similar devices) []  - 0 Simple Staple / Suture removal (25 or less) []  - 0 Complex Staple / Suture removal (26 or more) []  - 0 Hypo / Hyperglycemic Management (close monitor of Blood Glucose) []  - 0 Ankle / Brachial Index (ABI) - do not check if billed separately X- 1 5 Vital Signs [name], [name] ([id number]) Has the patient been seen at the hospital within the last three years: Yes Total Score: 200 Level Of Care: New/Established - Level 5 Electronic Signature(s) Signed: 07/20/2017 4:36:13 PM By: Renne Crigler Entered By: Renne Crigler on 07/20/2017 11:32:53 [name], [name] ([id number]) -------------------------------------------------------------------------------- Encounter Discharge Information Details Patient Name: [name] Date of Service: 07/20/2017 10:15 AM Medical Record Number: [id number] Patient Account Number: [id number] Date of Birth/Sex: October 21, 1945 (71 y.o. Female) Treating RN: Renne Crigler Primary Care  Callan Norden: Quintin Alto Other Clinician: Referring Shaily Librizzi: Quintin Alto Treating Verita Kuroda/Extender: Altamese [place] in Treatment: 5 Encounter Discharge Information Items Schedule Follow-up Appointment: No Medication Reconciliation completed and No provided to Patient/Care Meghann [place]: Provided on Clinical Summary of Care: 07/20/2017 Form Type Recipient Paper Patient MD Electronic Signature(s) Signed: 07/21/2017 11:25:55 AM By: Gwenlyn [name] Entered By: Gwenlyn [name] on 07/20/2017 11:30:44 Mcauley, [name] ([id number]) -------------------------------------------------------------------------------- Lower Extremity Assessment Details Patient Name: [name] Date of Service: 07/20/2017 10:15 AM Medical Record Number: [id number] Patient Account Number: [id number] Date of Birth/Sex: 02-14-1946 (71 y.o. Female) Treating RN: Renne Crigler Primary Care Rio Taber: Quintin Alto Other Clinician: Referring Keylin Podolsky: Quintin Alto Treating Devine Klingel/Extender: Altamese [place] in Treatment: 5 Edema Assessment Assessed: [Left: No] [Right: No] Edema: [Left: No] [Right: No] Vascular Assessment Claudication: Claudication Assessment [Left:Intermittent] [Right:Intermittent] Pulses: Dorsalis Pedis Palpable: [Left:No] [Right:No] Doppler Audible: [Left:Yes] [Right:Yes] Posterior Tibial Extremity colors, hair growth, and conditions: Extremity Color: [Left:Normal] [Right:Normal] Hair Growth on Extremity: [Left:No] [Right:No] Temperature of Extremity: [Left:Warm] [Right:Warm] Capillary Refill: [Left:> 3 seconds] [Right:> 3 seconds] Toe Nail Assessment Left: Right: Thick: Yes Yes Discolored: Yes Yes Deformed: Yes Yes Improper Length and Hygiene: Yes Electronic Signature(s) Signed: 07/20/2017 4:36:13 PM By: Renne Crigler Entered By: Renne Crigler on 07/20/2017 10:45:35 [name], [name]  ([id number]) -------------------------------------------------------------------------------- Multi Wound Chart Details Patient Name: [name] Date of Service: 07/20/2017 10:15 AM Medical Record Number: [id number] Patient Account Number: [id number] Date of Birth/Sex: 11-03-1945 (71 y.o. 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 Comorbid History: N/A N/A N/A Date Acquired: 05/30/2017 02/21/2017 04/25/2017 Weeks of Treatment: 5 4 3  Wound Status: Open Open Healed - Epithelialized Clustered Wound: Yes No No Pending Amputation on Yes No No Presentation: Measurements L x W x D 0.8x0.6x0.1 0.5x0.5x0.1 0x0x0 (cm) Area (cm) : 0.377 0.196 0 Volume (cm) : 0.038 0.02 0 % Reduction in Area: 98.50% 87.00% 100.00% % Reduction in Volume: 98.50% 86.80% 100.00% Classification: Full Thickness Without Partial Thickness Full Thickness Without Exposed Support Structures Exposed Support Structures Exudate Amount: N/A N/A N/A Exudate Type: N/A N/A N/A Exudate Color: N/A N/A N/A Foul Odor After Cleansing: N/A N/A N/A Odor Anticipated Due to N/A N/A N/A Product Use: Wound Margin: N/A N/A N/A Granulation Amount: N/A N/A N/A Granulation Quality: N/A N/A N/A Necrotic Amount: N/A N/A N/A [name], [name] ([id number]) Necrotic Tissue: N/A N/A  N/A Epithelialization: N/A N/A N/A Periwound Skin Texture: No Abnormalities Noted No Abnormalities Noted No Abnormalities Noted Periwound Skin Moisture: No Abnormalities Noted No Abnormalities Noted No Abnormalities Noted Periwound Skin Color: No Abnormalities Noted No Abnormalities Noted No Abnormalities Noted Temperature: N/A N/A N/A Tenderness on Palpation: No No No Wound Preparation: N/A N/A N/A Wound Number: [id number] 5 6  Photos: No Photos Wound Location: Right Toe Fifth - Dorsal Left Toe Great - Distal Left Toe Great - Lateral Wounding Event: Gradually Appeared Gradually Appeared Gradually Appeared 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 Comorbid History: Anemia, Arrhythmia, Anemia, Arrhythmia, Anemia, Arrhythmia, Congestive Heart Failure, Congestive Heart Failure, Congestive Heart Failure, Hypertension, Myocardial Hypertension, Myocardial Hypertension, Myocardial Infarction, Peripheral Arterial Infarction, Peripheral Arterial Infarction, Peripheral Arterial Disease, Neuropathy Disease, Neuropathy Disease, Neuropathy Date Acquired: 07/19/2017 07/17/2017 07/17/2017 Weeks of Treatment: 0 0 0 Wound Status: Open Open Open Clustered Wound: No No No Pending Amputation on No No No Presentation: Measurements L x W x D 0.2x0.2x0.1 2x1x0.01 1.8x2.2x0.1 (cm) Area (cm) : 0.031 1.571 3.11 Volume (cm) : 0.003 0.016 0.311 % Reduction in Area: N/A N/A N/A % Reduction in Volume: N/A N/A N/A Classification: Partial Thickness Partial Thickness Partial Thickness Exudate Amount: Small None Present Medium Exudate Type: Serous N/A Serosanguineous Exudate Color: amber N/A red, brown Foul Odor After Cleansing: No No Yes Odor Anticipated Due to N/A N/A No Product Use: Wound Margin: Flat and Intact Flat and Intact Distinct, outline attached Granulation Amount: Large (67-100%) None Present (0%) Small (1-33%) Granulation Quality: Pink N/A  Pink Necrotic Amount: None Present (0%) Large (67-100%) Large (67-100%) Necrotic Tissue: N/A Eschar Eschar, Adherent Slough Exposed Structures: Fascia: No Fascia: No Fascia: No Fat Layer (Subcutaneous Fat Layer (Subcutaneous Fat Layer (Subcutaneous Tissue) Exposed: No Tissue) Exposed: No Tissue) Exposed: No Tendon: No Tendon: No Tendon: No Muscle: No Muscle: No Muscle: No Joint: No Joint: No Joint: No Bone: No Bone: No Bone: No [name], [name] ([id number]) Epithelialization: None Large (67-100%) Large (67-100%) Periwound Skin Texture: Excoriation: No Induration: Yes Induration: Yes Induration: No Callus: Yes Callus: Yes Callus: No Scarring: Yes Crepitus: Yes Crepitus: No Excoriation: No Excoriation: No Rash: No Crepitus: No Rash: No Scarring: No Rash: No Scarring: No Periwound Skin Moisture: Maceration: No Maceration: No Maceration: No Dry/Scaly: No Dry/Scaly: No Dry/Scaly: No Periwound Skin Color: Atrophie Blanche: No Atrophie Blanche: No Atrophie Blanche: No Cyanosis: No Cyanosis: No Cyanosis: No Ecchymosis: No Ecchymosis: No Ecchymosis: No Erythema: No Erythema: No Erythema: No Hemosiderin Staining: No Hemosiderin Staining: No Hemosiderin Staining: No Mottled: No Mottled: No  Mottled: No Pallor: No Pallor: No Pallor: No Rubor: No Rubor: No Rubor: No Temperature: N/A No Abnormality No Abnormality Tenderness on Palpation: No Yes Yes Wound Preparation: Ulcer Cleansing: Ulcer Cleansing: Ulcer Cleansing: Rinsed/Irrigated with Saline Rinsed/Irrigated with Saline Rinsed/Irrigated with Saline Topical Anesthetic Applied: Topical Anesthetic Applied: Topical Anesthetic Applied: Other: lidocaine 4% Other: lidocaine 4% Other: lidocaine 4% Wound Number: 7 N/A N/A Photos: N/A N/A Wound Location: Left Toe Third N/A N/A Wounding Event: Gradually Appeared N/A N/A Primary Etiology: Arterial Insufficiency Ulcer N/A N/A Comorbid History:  Anemia, Arrhythmia, N/A N/A Congestive Heart Failure, Hypertension, Myocardial Infarction, Peripheral Arterial Disease, Neuropathy Date Acquired: 07/16/2017 N/A N/A Weeks of Treatment: 0 N/A N/A Wound Status: Open N/A N/A Clustered Wound: No N/A N/A Pending Amputation on No N/A N/A Presentation: Measurements L x W x D 1.2x0.8x0.1 N/A N/A (cm) Area (cm) : 0.754 N/A N/A Volume (cm) : 0.075 N/A N/A % Reduction in Area: N/A N/A N/A % Reduction in Volume: N/A N/A N/A Classification: Partial Thickness N/A N/A Exudate Amount: None Present N/A N/A Exudate Type: N/A N/A N/A [name], [name] [name] ([id number]) Exudate Color: N/A N/A N/A Foul Odor After Cleansing: No N/A N/A Odor Anticipated Due to N/A N/A N/A Product Use: Wound Margin: Distinct, outline attached N/A N/A Granulation Amount: None Present (0%) N/A N/A Granulation Quality: N/A N/A N/A Necrotic Amount: Large (67-100%) N/A N/A Necrotic Tissue: Eschar N/A N/A Exposed Structures: Fascia: No N/A N/A Fat Layer (Subcutaneous Tissue) Exposed: No Tendon: No Muscle: No Joint: No Bone: No Epithelialization: Large (67-100%) N/A N/A Periwound Skin Texture: Callus: Yes N/A N/A Excoriation: No Induration: No Crepitus: No Rash: No Scarring: No Periwound Skin Moisture: Maceration: No N/A N/A Dry/Scaly: No Periwound Skin Color: Atrophie Blanche: No N/A N/A Cyanosis: No Ecchymosis: No Erythema: No Hemosiderin Staining: No Mottled: No Pallor: No Rubor: No Temperature: No Abnormality N/A N/A Tenderness on Palpation: Yes N/A N/A Wound Preparation: Ulcer Cleansing: N/A N/A Rinsed/Irrigated with Saline Topical Anesthetic Applied: Other: lidocaine 4% Treatment Notes Wound #1 (Right, Distal Toe - Web between 2nd and 3rd) 1. Cleansed with: Clean wound with Normal Saline 2. Anesthetic Topical Lidocaine 4% cream to wound bed prior to debridement 4. Dressing Applied: Other dressing (specify in notes) 5. Secondary Dressing  Applied ABD Pad 7. Secured with Tape Tubigrip Notes silvercell between toes, and covering wounds, gauze wrap secure with tape [name], [name] ([id number]) Wound #2 (Right Toe - Web between 3rd and 4th) 1. Cleansed with: Clean wound with Normal Saline 2. Anesthetic Topical Lidocaine 4% cream to wound bed prior to debridement 4. Dressing Applied: Other dressing (specify in notes) 5. Secondary Dressing Applied ABD Pad 7. Secured with Tape Tubigrip Notes silvercell between toes, and covering wounds, gauze wrap secure with tape Wound #4 (Right, Dorsal Toe Fifth) 1. Cleansed with: Clean wound with Normal Saline 2. Anesthetic Topical Lidocaine 4% cream to wound bed prior to debridement 4. Dressing Applied: Other dressing (specify in notes) 5. Secondary Dressing Applied ABD Pad 7. Secured with Tape Tubigrip Notes silvercell between toes, and covering wounds, gauze wrap secure with tape Wound #5 (Left, Distal Toe Great) 1. Cleansed with: Clean wound with Normal Saline 2. Anesthetic Topical Lidocaine 4% cream to wound bed prior to debridement 4. Dressing Applied: Other dressing (specify in notes) 5. Secondary Dressing Applied ABD Pad 7. Secured with Tape Tubigrip Notes silvercell between toes, and covering wounds, gauze wrap secure with tape Wound #6 (Left, Lateral Toe Great) 1. Cleansed with: Clean wound with Normal Saline 2.  Anesthetic Topical Lidocaine 4% cream to wound bed prior to debridement 4. Dressing Applied: NAAMA, [name] ([id number]) Other dressing (specify in notes) 5. Secondary Dressing Applied ABD Pad 7. Secured with Tape Tubigrip Notes silvercell between toes, and covering wounds, gauze wrap secure with tape Wound #7 (Left Toe Third) 1. Cleansed with: Clean wound with Normal Saline 2. Anesthetic Topical Lidocaine 4% cream to wound bed prior to debridement 4. Dressing Applied: Other dressing (specify in notes) 5. Secondary Dressing  Applied ABD Pad 7. Secured with Tape Tubigrip Notes silvercell between toes, and covering wounds, gauze wrap secure with tape Electronic Signature(s) Signed: 07/20/2017 5:00:32 PM By: Baltazar Najjar MD Entered By: Baltazar Najjar on 07/20/2017 12:32:14 Brossard, [name] ([id number]) -------------------------------------------------------------------------------- Multi-Disciplinary Care Plan Details Patient Name: [name] Date of Service: 07/20/2017 10:15 AM Medical Record Number: [id number] Patient Account Number: [id number] Date of Birth/Sex: 1946-05-22 (71 y.o. Female) Treating RN: Renne Crigler Primary Care Lesli Issa: Quintin Alto Other Clinician: Referring Tangie Stay: Quintin Alto Treating Emerlyn Mehlhoff/Extender: Altamese [place] in Treatment: 5 Active Inactive ` Abuse / Safety / Falls / Self Care Management Nursing Diagnoses: History of Falls Impaired home maintenance Potential for falls Goals: Patient will not experience any injury related to falls Date Initiated: 06/13/2017 Target Resolution Date: 07/14/2017 Goal Status: Active Patient will remain injury free related to falls Date Initiated: 06/13/2017 Target Resolution Date: 07/14/2017 Goal Status: Active Interventions: Assess fall risk on admission and as needed Treatment Activities: Patient referred to home care : 06/13/2017 Notes: ` Necrotic Tissue Nursing Diagnoses: Impaired tissue integrity related to necrotic/devitalized tissue Goals: Necrotic/devitalized tissue will be minimized in the wound bed Date Initiated: 06/13/2017 Target Resolution Date: 07/14/2017 Goal Status: Active Interventions: Assess patient pain level pre-, during and post procedure and prior to discharge Provide education on necrotic tissue and debridement process Treatment Activities: Apply topical anesthetic as ordered : 06/13/2017 Notes: Trentman, [name] ([id number]) ` Orientation to the Wound Care Program Nursing  Diagnoses: Knowledge deficit related to the wound healing center program Goals: Patient/caregiver will verbalize understanding of the Wound Healing Center Program Date Initiated: 06/13/2017 Target Resolution Date: 07/14/2017 Goal Status: Active Interventions: Provide education on orientation to the wound center Notes: ` Wound/Skin Impairment Nursing Diagnoses: Impaired tissue integrity Knowledge deficit related to ulceration/compromised skin integrity Goals: Ulcer/skin breakdown will have a volume reduction of 30% by week 4 Date Initiated: 06/13/2017 Target Resolution Date: 07/14/2017 Goal Status: Active Interventions: Assess ulceration(s) every visit Treatment Activities: Patient referred to home care : 06/13/2017 Topical wound management initiated : 06/13/2017 Notes: Electronic Signature(s) Signed: 07/20/2017 4:36:13 PM By: Renne Crigler Entered By: Renne Crigler on 07/20/2017 10:45:43 Kowalski, [name] ([id number]) -------------------------------------------------------------------------------- Pain Assessment Details Patient Name: [name] Date of Service: 07/20/2017 10:15 AM Medical Record Number: [id number] Patient Account Number: [id number] Date of Birth/Sex: [birth year] (71 y.o. Female) Treating RN: Renne Crigler Primary Care Satish Hammers: Quintin Alto Other Clinician: Referring Io Dieujuste: Quintin Alto Treating Donaldo Teegarden/Extender: Altamese [place] in Treatment: 5 Active Problems Location of Pain Severity and Description of Pain Patient Has Paino Yes Site Locations Pain Location: Pain in Ulcers Duration of the Pain. Constant / Intermittento Intermittent Rate the pain. Current Pain Level: 6 Pain Management and Medication Current Pain Management: Electronic Signature(s) Signed: 07/20/2017 4:36:13 PM By: Renne Crigler Entered By: Renne Crigler on 07/20/2017 10:18:50 Davern, [name]  ([id number]) -------------------------------------------------------------------------------- Patient/Caregiver Education Details Patient Name: [name] Date of Service: 07/20/2017 10:15 AM Medical Record Number: [id number] Patient Account Number: [id number] Date of Birth/Gender:  1946/06/10 (71 y.o. Female) Treating RN: Renne Crigler Primary Care Physician: Quintin Alto Other Clinician: Referring Physician: Quintin Alto Treating Physician/Extender: Altamese [place] in Treatment: 5 Education Assessment Education Provided To: Patient and Caregiver Education Topics Provided Wound/Skin Impairment: Handouts: Caring for Your Ulcer Methods: Explain/Verbal Responses: State content correctly Electronic Signature(s) Signed: 07/20/2017 4:36:13 PM By: Renne Crigler Entered By: Renne Crigler on 07/20/2017 11:37:10 Decou, [name] ([id number]) -------------------------------------------------------------------------------- Wound Assessment Details Patient Name: [name] Date of Service: 07/20/2017 10:15 AM Medical Record Number: [id number] Patient Account Number: [id number] Date of Birth/Sex: 1946/05/29 (72 y.o. Female) Treating RN: Renne Crigler Primary Care Dortha Neighbors: Quintin Alto Other Clinician: Referring Juan Kissoon: Quintin Alto Treating Zelma Mazariego/Extender: Altamese [place] in Treatment: 5 Wound Status Wound Number: 1 Primary Etiology: Arterial Insufficiency Ulcer Wound Location: Right, Distal Toe - Web between 2nd Wound Status: Open and 3rd Wounding Event: Gradually Appeared Date Acquired: 05/30/2017 Weeks Of Treatment: 5 Clustered Wound: Yes Pending Amputation On Presentation Photos Photo Uploaded By: Renne Crigler on 07/20/2017 11:46:49 Wound Measurements Length: (cm) 0.8 Width: (cm) 0.6 Depth: (cm) 0.1 Area: (cm) 0.377 Volume: (cm) 0.038 % Reduction in Area: 98.5% % Reduction in Volume: 98.5% Wound Description Full  Thickness Without Exposed Support Classification: Structures Periwound Skin Texture Texture Color No Abnormalities Noted: No No Abnormalities Noted: No Moisture No Abnormalities Noted: No Treatment Notes Wound #1 (Right, Distal Toe - Web between 2nd and 3rd) 1. Cleansed with: Clean wound with Normal Saline 2. Anesthetic Astacio, Princes G. ([id number]) Topical Lidocaine 4% cream to wound bed prior to debridement 4. Dressing Applied: Other dressing (specify in notes) 5. Secondary Dressing Applied ABD Pad 7. Secured with Tape Tubigrip Notes silvercell between toes, and covering wounds, gauze wrap secure with tape Electronic Signature(s) Signed: 07/20/2017 4:36:13 PM By: Renne Crigler Entered By: Renne Crigler on 07/20/2017 10:30:22 Sottile, [name] ([id number]) -------------------------------------------------------------------------------- Wound Assessment Details Patient Name: [name] Date of Service: 07/20/2017 10:15 AM Medical Record Number: [id number] Patient Account Number: [id number] Date of Birth/Sex: [birth year] (71 y.o. Female) Treating RN: Renne Crigler Primary Care Mattis Featherly: Quintin Alto Other Clinician: Referring Jada Kuhnert: Quintin Alto Treating Jeneva Schweizer/Extender: Altamese [place] in Treatment: 5 Wound Status Wound Number: 2 Primary Etiology: Arterial Insufficiency Ulcer Wound Location: Right Toe - Web between 3rd and 4th Wound Status: Open Wounding Event: Pressure Injury Date Acquired: 02/21/2017 Weeks Of Treatment: 4 Clustered Wound: No Photos Photo Uploaded By: Renne Crigler on 07/20/2017 11:46:50 Wound Measurements Length: (cm) 0.5 Width: (cm) 0.5 Depth: (cm) 0.1 Area: (cm) 0.196 Volume: (cm) 0.02 % Reduction in Area: 87% % Reduction in Volume: 86.8% Wound Description Classification: Partial Thickness Periwound Skin Texture Texture Color No Abnormalities Noted: No No Abnormalities Noted: No Moisture No  Abnormalities Noted: No Treatment Notes Wound #2 (Right Toe - Web between 3rd and 4th) 1. Cleansed with: Clean wound with Normal Saline 2. Anesthetic Topical Lidocaine 4% cream to wound bed prior to debridement 4. Dressing Applied: NARCISSUS, DETWILER ([id number]) Other dressing (specify in notes) 5. Secondary Dressing Applied ABD Pad 7. Secured with Tape Tubigrip Notes silvercell between toes, and covering wounds, gauze wrap secure with tape Electronic Signature(s) Signed: 07/20/2017 4:36:13 PM By: Renne Crigler Entered By: Renne Crigler on 07/20/2017 10:30:23 Ulatowski, [name] ([id number]) -------------------------------------------------------------------------------- Wound Assessment Details Patient Name: [name] Date of Service: 07/20/2017 10:15 AM Medical Record Number: [id number] Patient Account Number: [id number] Date of Birth/Sex: 1945/09/01 (72 y.o. Female) Treating RN: Renne Crigler Primary Care Roniya Tetro: Quintin Alto Other Clinician: Referring  Gad Aymond: Quintin AltoBURDINE, STEVEN Treating Banjamin Stovall/Extender: Altamese CarolinaOBSON, MICHAEL G Weeks in Treatment: 5 Wound Status Wound Number: 3 Primary Etiology: Arterial Insufficiency Ulcer Wound Location: Right Toe - Web between 4th and 5th Wound Status: Healed - Epithelialized Wounding Event: Gradually Appeared Date Acquired: 04/25/2017 Weeks Of Treatment: 3 Clustered Wound: No Photos Photo Uploaded By: Renne CriglerFlinchum, Cheryl on 07/20/2017 11:50:24 Wound Measurements Length: (cm) 0 Width: (cm) 0 Depth: (cm) 0 Area: (cm) 0 Volume: (cm) 0 % Reduction in Area: 100% % Reduction in Volume: 100% Wound Description Full Thickness Without Exposed Support Classification: Structures Periwound Skin Texture Texture Color No Abnormalities Noted: No No Abnormalities Noted: No Moisture No Abnormalities Noted: No Electronic Signature(s) Signed: 07/20/2017 4:36:13 PM By: Renne CriglerFlinchum, Cheryl Entered By: Renne CriglerFlinchum, Cheryl on 07/20/2017  10:30:24 [name], [name] ([id number]) -------------------------------------------------------------------------------- Wound Assessment Details Patient Name: [name] LoaEFALCO, Evelina G. Date of Service: 07/20/2017 10:15 AM Medical Record Number: [id number] Patient Account Number: [id number] Date of Birth/Sex: December 02, 1945 (71 y.o. Female) Treating RN: Renne CriglerFlinchum, Cheryl Primary Care Dorcus Riga: Quintin AltoBURDINE, STEVEN Other Clinician: Referring Bynum Mccullars: Quintin AltoBURDINE, STEVEN Treating Dillan Lunden/Extender: Altamese CarolinaOBSON, MICHAEL G Weeks in Treatment: 5 Wound Status Wound Number: 4 Primary Arterial Insufficiency Ulcer Etiology: Wound Location: Right Toe Fifth - Dorsal Wound Open Wounding Event: Gradually Appeared Status: Date Acquired: 07/19/2017 Comorbid Anemia, Arrhythmia, Congestive Heart Failure, Weeks Of Treatment: 0 History: Hypertension, Myocardial Infarction, Peripheral Clustered Wound: No Arterial Disease, Neuropathy Wound Measurements Length: (cm) 0.2 Width: (cm) 0.2 Depth: (cm) 0.1 Area: (cm) 0.031 Volume: (cm) 0.003 % Reduction in Area: % Reduction in Volume: Epithelialization: None Tunneling: No Undermining: No Wound Description Classification: Partial Thickness Wound Margin: Flat and Intact Exudate Amount: Small Exudate Type: Serous Exudate Color: amber Foul Odor After Cleansing: No Slough/Fibrino No Wound Bed Granulation Amount: Large (67-100%) Exposed Structure Granulation Quality: Pink Fascia Exposed: No Necrotic Amount: None Present (0%) Fat Layer (Subcutaneous Tissue) Exposed: No Tendon Exposed: No Muscle Exposed: No Joint Exposed: No Bone Exposed: No Periwound Skin Texture Texture Color No Abnormalities Noted: No No Abnormalities Noted: No Callus: No Atrophie Blanche: No Crepitus: No Cyanosis: No Excoriation: No Ecchymosis: No Induration: No Erythema: No Rash: No Hemosiderin Staining: No Scarring: No Mottled: No Pallor: No Moisture Rubor: No No Abnormalities  Noted: No Dry / Scaly: No Maceration: No Sortor, Addyson G. ([id number]) Wound Preparation Ulcer Cleansing: Rinsed/Irrigated with Saline Topical Anesthetic Applied: Other: lidocaine 4%, Treatment Notes Wound #4 (Right, Dorsal Toe Fifth) 1. Cleansed with: Clean wound with Normal Saline 2. Anesthetic Topical Lidocaine 4% cream to wound bed prior to debridement 4. Dressing Applied: Other dressing (specify in notes) 5. Secondary Dressing Applied ABD Pad 7. Secured with Tape Tubigrip Notes silvercell between toes, and covering wounds, gauze wrap secure with tape Electronic Signature(s) Signed: 07/20/2017 4:36:13 PM By: Renne CriglerFlinchum, Cheryl Entered By: Renne CriglerFlinchum, Cheryl on 07/20/2017 10:33:26 Morella, [name] ([id number]) -------------------------------------------------------------------------------- Wound Assessment Details Patient Name: [name] LoaEFALCO, Zanyia G. Date of Service: 07/20/2017 10:15 AM Medical Record Number: [id number] Patient Account Number: [id number] Date of Birth/Sex: December 02, 1945 [age] y.o. Female) Treating RN: Renne CriglerFlinchum, Cheryl Primary Care Shariq Puig: Quintin AltoBURDINE, STEVEN Other Clinician: Referring Jamal Haskin: Quintin AltoBURDINE, STEVEN Treating Dayvin Aber/Extender: Maxwell CaulOBSON, MICHAEL G Weeks in Treatment: 5 Wound Status Wound Number: 5 Primary Arterial Insufficiency Ulcer Etiology: Wound Location: Left Toe Great - Distal Wound Open Wounding Event: Gradually Appeared Status: Date Acquired: 07/17/2017 Comorbid Anemia, Arrhythmia, Congestive Heart Failure, Weeks Of Treatment: 0 History: Hypertension, Myocardial Infarction, Peripheral Clustered Wound: No Arterial Disease, Neuropathy Photos Photo Uploaded By: Renne CriglerFlinchum, Cheryl on 07/20/2017 11:51:14 Wound Measurements Length: (cm) 2 Width: (cm)  1 Depth: (cm) 0.01 Area: (cm) 1.571 Volume: (cm) 0.016 % Reduction in Area: % Reduction in Volume: Epithelialization: Large (67-100%) Tunneling: No Undermining: No Wound  Description Classification: Partial Thickness Wound Margin: Flat and Intact Exudate Amount: None Present Foul Odor After Cleansing: No Slough/Fibrino Yes Wound Bed Granulation Amount: None Present (0%) Exposed Structure Necrotic Amount: Large (67-100%) Fascia Exposed: No Necrotic Quality: Eschar Fat Layer (Subcutaneous Tissue) Exposed: No Tendon Exposed: No Muscle Exposed: No Joint Exposed: No Bone Exposed: No Periwound Skin Texture Texture Color No Abnormalities Noted: No No Abnormalities Noted: No Mwangi, Mailyn G. ([id number]) Callus: Yes Atrophie Blanche: No Crepitus: No Cyanosis: No Excoriation: No Ecchymosis: No Induration: Yes Erythema: No Rash: No Hemosiderin Staining: No Scarring: Yes Mottled: No Pallor: No Moisture Rubor: No No Abnormalities Noted: No Dry / Scaly: No Temperature / Pain Maceration: No Temperature: No Abnormality Tenderness on Palpation: Yes Wound Preparation Ulcer Cleansing: Rinsed/Irrigated with Saline Topical Anesthetic Applied: Other: lidocaine 4%, Treatment Notes Wound #5 (Left, Distal Toe Great) 1. Cleansed with: Clean wound with Normal Saline 2. Anesthetic Topical Lidocaine 4% cream to wound bed prior to debridement 4. Dressing Applied: Other dressing (specify in notes) 5. Secondary Dressing Applied ABD Pad 7. Secured with Tape Tubigrip Notes silvercell between toes, and covering wounds, gauze wrap secure with tape Electronic Signature(s) Signed: 07/20/2017 4:36:13 PM By: Renne Crigler Entered By: Renne Crigler on 07/20/2017 10:36:46 Jury, [name] ([id number]) -------------------------------------------------------------------------------- Wound Assessment Details Patient Name: [name] Date of Service: 07/20/2017 10:15 AM Medical Record Number: [id number] Patient Account Number: [id number] Date of Birth/Sex: 07/14/1945 (71 y.o. Female) Treating RN: Renne Crigler Primary Care Tyrus Wilms: Quintin Alto  Other Clinician: Referring Shelbi Vaccaro: Quintin Alto Treating Zyren Sevigny/Extender: Altamese [place] in Treatment: 5 Wound Status Wound Number: 6 Primary Arterial Insufficiency Ulcer Etiology: Wound Location: Left Toe Great - Lateral Wound Open Wounding Event: Gradually Appeared Status: Date Acquired: 07/17/2017 Comorbid Anemia, Arrhythmia, Congestive Heart Failure, Weeks Of Treatment: 0 History: Hypertension, Myocardial Infarction, Peripheral Clustered Wound: No Arterial Disease, Neuropathy Photos Photo Uploaded By: Renne Crigler on 07/20/2017 11:51:32 Wound Measurements Length: (cm) 1.8 Width: (cm) 2.2 Depth: (cm) 0.1 Area: (cm) 3.11 Volume: (cm) 0.311 % Reduction in Area: % Reduction in Volume: Epithelialization: Large (67-100%) Tunneling: No Undermining: No Wound Description Classification: Partial Thickness Wound Margin: Distinct, outline attached Exudate Amount: Medium Exudate Type: Serosanguineous Exudate Color: red, brown Foul Odor After Cleansing: Yes Due to Product Use: No Slough/Fibrino Yes Wound Bed Granulation Amount: Small (1-33%) Exposed Structure Granulation Quality: Pink Fascia Exposed: No Necrotic Amount: Large (67-100%) Fat Layer (Subcutaneous Tissue) Exposed: No Necrotic Quality: Eschar, Adherent Slough Tendon Exposed: No Muscle Exposed: No Joint Exposed: No Bone Exposed: No Periwound Skin Texture Limpert, Fynley G. ([id number]) Texture Color No Abnormalities Noted: No No Abnormalities Noted: No Callus: Yes Atrophie Blanche: No Crepitus: Yes Cyanosis: No Excoriation: No Ecchymosis: No Induration: Yes Erythema: No Rash: No Hemosiderin Staining: No Scarring: No Mottled: No Pallor: No Moisture Rubor: No No Abnormalities Noted: No Dry / Scaly: No Temperature / Pain Maceration: No Temperature: No Abnormality Tenderness on Palpation: Yes Wound Preparation Ulcer Cleansing: Rinsed/Irrigated with Saline Topical Anesthetic  Applied: Other: lidocaine 4%, Treatment Notes Wound #6 (Left, Lateral Toe Great) 1. Cleansed with: Clean wound with Normal Saline 2. Anesthetic Topical Lidocaine 4% cream to wound bed prior to debridement 4. Dressing Applied: Other dressing (specify in notes) 5. Secondary Dressing Applied ABD Pad 7. Secured with Tape Tubigrip Notes silvercell between toes, and covering wounds, gauze wrap secure  with tape Electronic Signature(s) Signed: 07/20/2017 4:36:13 PM By: Renne Crigler Entered By: Renne Crigler on 07/20/2017 10:41:42 [place], [name] ([id number]) -------------------------------------------------------------------------------- Wound Assessment Details Patient Name: [name] Date of Service: 07/20/2017 10:15 AM Medical Record Number: [id number] Patient Account Number: [id number] Date of Birth/Sex: 09/08/1945 (72 y.o. Female) Treating RN: Renne Crigler Primary Care Haydee Jabbour: Quintin Alto Other Clinician: Referring Yurem Viner: Quintin Alto Treating Chelli Yerkes/Extender: Altamese [place] in Treatment: 5 Wound Status Wound Number: 7 Primary Arterial Insufficiency Ulcer Etiology: Wound Location: Left Toe Third Wound Open Wounding Event: Gradually Appeared Status: Date Acquired: 07/16/2017 Comorbid Anemia, Arrhythmia, Congestive Heart Failure, Weeks Of Treatment: 0 History: Hypertension, Myocardial Infarction, Peripheral Clustered Wound: No Arterial Disease, Neuropathy Photos Photo Uploaded By: Renne Crigler on 07/20/2017 11:51:52 Wound Measurements Length: (cm) 1.2 Width: (cm) 0.8 Depth: (cm) 0.1 Area: (cm) 0.754 Volume: (cm) 0.075 % Reduction in Area: % Reduction in Volume: Epithelialization: Large (67-100%) Tunneling: No Undermining: No Wound Description Classification: Partial Thickness Wound Margin: Distinct, outline attached Exudate Amount: None Present Foul Odor After Cleansing: No Slough/Fibrino No Wound Bed Granulation  Amount: None Present (0%) Exposed Structure Necrotic Amount: Large (67-100%) Fascia Exposed: No Necrotic Quality: Eschar Fat Layer (Subcutaneous Tissue) Exposed: No Tendon Exposed: No Muscle Exposed: No Joint Exposed: No Bone Exposed: No Periwound Skin Texture Texture Color No Abnormalities Noted: No No Abnormalities Noted: No Shehadeh, Mekisha G. ([id number]) Callus: Yes Atrophie Blanche: No Crepitus: No Cyanosis: No Excoriation: No Ecchymosis: No Induration: No Erythema: No Rash: No Hemosiderin Staining: No Scarring: No Mottled: No Pallor: No Moisture Rubor: No No Abnormalities Noted: No Dry / Scaly: No Temperature / Pain Maceration: No Temperature: No Abnormality Tenderness on Palpation: Yes Wound Preparation Ulcer Cleansing: Rinsed/Irrigated with Saline Topical Anesthetic Applied: Other: lidocaine 4%, Treatment Notes Wound #7 (Left Toe Third) 1. Cleansed with: Clean wound with Normal Saline 2. Anesthetic Topical Lidocaine 4% cream to wound bed prior to debridement 4. Dressing Applied: Other dressing (specify in notes) 5. Secondary Dressing Applied ABD Pad 7. Secured with Tape Tubigrip Notes silvercell between toes, and covering wounds, gauze wrap secure with tape Electronic Signature(s) Signed: 07/20/2017 4:36:13 PM By: Renne Crigler Entered By: Renne Crigler on 07/20/2017 10:44:29 Yam, [name] ([id number]) -------------------------------------------------------------------------------- Vitals Details Patient Name: [name] Date of Service: 07/20/2017 10:15 AM Medical Record Number: [id number] Patient Account Number: [id number] Date of Birth/Sex: [birth year] (72 y.o. Female) Treating RN: Renne Crigler Primary Care Tyshell Ramberg: Quintin Alto Other Clinician: Referring Tex Conroy: Quintin Alto Treating Kyshawn Teal/Extender: Altamese [place] in Treatment: 5 Vital Signs Time Taken: 10:19 Temperature (F): 98.1 Height (in):  63 Pulse (bpm): 85 Weight (lbs): 176 Respiratory Rate (breaths/min): 16 Body Mass Index (BMI): 31.2 Blood Pressure (mmHg): 109/69 Reference Range: 80 - 120 mg / dl Electronic Signature(s) Signed: 07/20/2017 4:36:13 PM By: Renne Crigler Entered By: Renne Crigler on 07/20/2017 10:19:20

## 2017-07-26 ENCOUNTER — Ambulatory Visit (HOSPITAL_COMMUNITY)
Admission: RE | Admit: 2017-07-26 | Discharge: 2017-07-26 | Disposition: A | Discharge status: Home / Self Care | Payer: Medicare Other | Source: Ambulatory Visit | Attending: Cardiology | Admitting: Cardiology

## 2017-07-26 VITALS — BP 96/64 | HR 89 | Wt 168.2 lb

## 2017-07-26 DIAGNOSIS — I70229 Atherosclerosis of native arteries of extremities with rest pain, unspecified extremity: Secondary | ICD-10-CM

## 2017-07-26 DIAGNOSIS — I252 Old myocardial infarction: Secondary | ICD-10-CM | POA: Diagnosis not present

## 2017-07-26 DIAGNOSIS — I998 Other disorder of circulatory system: Secondary | ICD-10-CM | POA: Diagnosis not present

## 2017-07-26 DIAGNOSIS — I2510 Atherosclerotic heart disease of native coronary artery without angina pectoris: Secondary | ICD-10-CM | POA: Diagnosis not present

## 2017-07-26 DIAGNOSIS — Z885 Allergy status to narcotic agent status: Secondary | ICD-10-CM | POA: Diagnosis not present

## 2017-07-26 DIAGNOSIS — I5042 Chronic combined systolic (congestive) and diastolic (congestive) heart failure: Secondary | ICD-10-CM | POA: Insufficient documentation

## 2017-07-26 DIAGNOSIS — Z87891 Personal history of nicotine dependence: Secondary | ICD-10-CM | POA: Insufficient documentation

## 2017-07-26 DIAGNOSIS — I480 Paroxysmal atrial fibrillation: Secondary | ICD-10-CM | POA: Insufficient documentation

## 2017-07-26 DIAGNOSIS — Z7901 Long term (current) use of anticoagulants: Secondary | ICD-10-CM | POA: Diagnosis not present

## 2017-07-26 DIAGNOSIS — I483 Typical atrial flutter: Secondary | ICD-10-CM | POA: Diagnosis not present

## 2017-07-26 DIAGNOSIS — I5022 Chronic systolic (congestive) heart failure: Secondary | ICD-10-CM

## 2017-07-26 DIAGNOSIS — J45909 Unspecified asthma, uncomplicated: Secondary | ICD-10-CM | POA: Insufficient documentation

## 2017-07-26 DIAGNOSIS — I4892 Unspecified atrial flutter: Secondary | ICD-10-CM | POA: Diagnosis present

## 2017-07-26 DIAGNOSIS — I110 Hypertensive heart disease with heart failure: Secondary | ICD-10-CM | POA: Insufficient documentation

## 2017-07-26 DIAGNOSIS — E785 Hyperlipidemia, unspecified: Secondary | ICD-10-CM | POA: Diagnosis not present

## 2017-07-26 DIAGNOSIS — M25512 Pain in left shoulder: Secondary | ICD-10-CM | POA: Diagnosis not present

## 2017-07-26 DIAGNOSIS — Z7982 Long term (current) use of aspirin: Secondary | ICD-10-CM | POA: Diagnosis not present

## 2017-07-26 DIAGNOSIS — K219 Gastro-esophageal reflux disease without esophagitis: Secondary | ICD-10-CM | POA: Insufficient documentation

## 2017-07-26 DIAGNOSIS — E039 Hypothyroidism, unspecified: Secondary | ICD-10-CM | POA: Diagnosis not present

## 2017-07-26 DIAGNOSIS — M549 Dorsalgia, unspecified: Secondary | ICD-10-CM | POA: Insufficient documentation

## 2017-07-26 DIAGNOSIS — E1151 Type 2 diabetes mellitus with diabetic peripheral angiopathy without gangrene: Secondary | ICD-10-CM | POA: Insufficient documentation

## 2017-07-26 DIAGNOSIS — I255 Ischemic cardiomyopathy: Secondary | ICD-10-CM | POA: Insufficient documentation

## 2017-07-26 DIAGNOSIS — E669 Obesity, unspecified: Secondary | ICD-10-CM | POA: Diagnosis not present

## 2017-07-26 DIAGNOSIS — Z951 Presence of aortocoronary bypass graft: Secondary | ICD-10-CM | POA: Insufficient documentation

## 2017-07-26 DIAGNOSIS — I471 Supraventricular tachycardia: Secondary | ICD-10-CM | POA: Diagnosis not present

## 2017-07-26 NOTE — Progress Notes (Signed)
Advanced Heart Failure Clinic Note   Primary Care: Juliette AlcideBurdine, Steven E, MD Primary Cardiologist: Dr. Diona BrownerMcDowell  HPI: [name] PerkingMary G [name] is a 72 y.o. female with a hx of Chronic dyspnea on exertion, CAD, multivessel status post coronary artery bypass grafting in 2003, chronic combined systolic and diastolic heart failure, hypertension, history of atrial flutter with cardioversion in 2016, hyperlipidemia, hypothyroidism, and ischemic cardiomyopathy.  She presented to the ED on 04/07/17 with complaints of chest pain and back pain described as stabbing pain in the left scapula, reminiscent of pain she experienced prior to MI in the past. No associated diaphoresis. The patient was recently seen by Dr. Diona BrownerMcDowell on 03/28/2017 with symptoms of dyspnea and discomfort, was ordered a [employer] Myoview. The test was found to be high risk with a large anterior apical and lateral wall infarct. EKG revealed atrial fibrillation with T-wave flattening in the lateral, and inferior leads, heart rate of 87 bpm. Essentially unchanged from prior EKG in July 2018. She underwent LHC that showed severe 3 vessel CAD, 1/3 patent grafts. There were no good targets for PCI. It was felt that her atrial fibrillation was contributing to her low EF. She was started on Amiodarone and underwent DCCV with successful return to NSR. Echo showed EF 20-25%, severely HK RV. Discharge weight was 175 pounds.   [facility]. Underwent DES R popliteal artery. By Dr. Allyson SabalBerry in 12/18  Developed pain and non-healing ulcer in LLE. PCP referred to Dr. Imogene Burnhen. Underwent aortogram on 07/14/2017 with Dr. Imogene Burnhen. Mild diffuse disease. No interventions due to small tibial arteries. Post Tib arteries occluded. Now pending possible BKA.   ABI 0.31 on 07/20/2017. Saw Dr Leanord Hawkingobson 07/20/2017 and at that time Left foot wounds were concerning. Increased pain and necrosis noted.  Cira Servantheere was concern that she may require LBKA.   Today she returns for HF  follow up. Last visit carvedilol was increased to 6.25 mg twice a day. Complaining of LLE pain. Denies SOB/PND/Orthopnea. No CP. No edema. Denies palpitations Able to take a few steps but limited due to pain. Weight at home 167 pounds. Taking all medications.   Past Medical History:  Diagnosis Date  . Asthma   . CAD (coronary artery disease)    Multivessel status post CABG in 2003  . Chronic combined systolic (congestive) and diastolic (congestive) heart failure (HCC) 12/25/2016  . Chronic systolic heart failure (HCC)   . Essential hypertension   . GERD (gastroesophageal reflux disease)   . Gum disease   . History of atrial flutter    [place]  . History of colonic polyps   . History of goiter   . Hyperlipidemia   . Hypothyroidism   . Ischemic cardiomyopathy 12/25/2016  . Myocardial infarction (HCC)    2003  . Peripheral arterial disease (HCC)    Stent revascularization 2015 - details not clear  . Renal insufficiency   . Type 2 diabetes mellitus (HCC)     Current Outpatient Medications  Medication Sig Dispense Refill  . acetaminophen (TYLENOL) 500 MG tablet Take 1,000 mg by mouth daily as needed for moderate pain.     Marland Kitchen. albuterol (PROVENTIL HFA;VENTOLIN HFA) 108 (90 Base) MCG/ACT inhaler Inhale 2 puffs into the lungs every 6 (six) hours as needed for wheezing or shortness of breath.     Marland Kitchen. amiodarone (PACERONE) 200 MG tablet Take 1 tablet (200 mg total) daily by mouth. 90 tablet 3  . apixaban (ELIQUIS) 5 MG TABS tablet Take  1 tablet (5 mg total) by mouth 2 (two) times daily. 60 tablet 8  . aspirin 81 MG EC tablet Take 1 tablet (81 mg total) by mouth daily.    . carvedilol (COREG) 6.25 MG tablet Take 1 tablet (6.25 mg total) by mouth 2 (two) times daily. 60 tablet 3  . clopidogrel (PLAVIX) 75 MG tablet Take 1 tablet (75 mg total) by mouth daily. 30 tablet 11  . HYDROcodone-acetaminophen (NORCO) 7.5-325 MG tablet Take 1 tablet by mouth every 6 (six) hours as  needed (for pain.).   0  . levothyroxine (SYNTHROID, LEVOTHROID) 175 MCG tablet Take 175 mcg by mouth daily before breakfast.    . losartan (COZAAR) 25 MG tablet Take 0.5 tablets (12.5 mg total) by mouth daily. 30 tablet 6  . Magnesium 250 MG TABS Take 250 mg by mouth daily.     . magnesium hydroxide (MILK OF MAGNESIA) 400 MG/5ML suspension Take 30 mLs by mouth daily as needed for mild constipation.    . nitroGLYCERIN (NITRODUR - DOSED IN MG/24 HR) 0.2 mg/hr patch PLACE 1 PATCH ONTO THE SKIN DAILY AS NEEDED FOR CHEST PAINS 90 patch 3  . oxymetazoline (AFRIN) 0.05 % nasal spray Place 1 spray into both nostrils daily as needed.     . pantoprazole (PROTONIX) 40 MG tablet Take 1 tablet (40 mg total) by mouth daily. 30 tablet 3  . potassium chloride (K-DUR,KLOR-CON) 10 MEQ tablet Take 2 tablets (20 mEq total) by mouth 2 (two) times daily. 120 tablet 6  . promethazine (PHENERGAN) 25 MG tablet Take 25 mg by mouth every 4 (four) hours as needed for nausea/vomiting.  0  . torsemide (DEMADEX) 20 MG tablet Take 2 tablets (40 mg total) by mouth 2 (two) times daily. May also take 1 tablet (20 mg total) as needed (for 3lb weight gain in 24 hours). 150 tablet 6   Current Facility-Administered Medications  Medication Dose Route Frequency Provider Last Rate Last Dose  . traMADol (ULTRAM) tablet 50 mg  50 mg Oral Q6H PRN Fransisco Hertz, MD        Allergies  Allergen Reactions  . Oxycodone Other (See Comments)    hallucinations   . Pollen Extract Other (See Comments)    Sneezing, running nose.  . Ranexa [Ranolazine] Nausea Only    Headache and nausea  . Statins Other (See Comments)    Cramping, turns skin yellow      Social History   Socioeconomic History  . Marital status: Married    Spouse name: Not on file  . Number of children: Not on file  . Years of education: Not on file  . Highest education level: Not on file  Social Needs  . Financial resource strain: Not very hard  . Food insecurity -  worry: Never true  . Food insecurity - inability: Never true  . Transportation needs - medical: No  . Transportation needs - non-medical: No  Occupational History  . Not on file  Tobacco Use  . Smoking status: Former Smoker    Packs/day: 1.00    Years: 21.00    Pack years: 21.00    Types: Cigarettes    Last attempt to quit: 1988    Years since quitting: 31.0  . Smokeless tobacco: Never Used  Substance and Sexual Activity  . Alcohol use: No  . Drug use: No  . Sexual activity: Not Currently    Partners: Male  Other Topics Concern  . Not on file  Social  History Narrative  . Not on file      Family History  Problem Relation Age of Onset  . Stroke Mother   . Heart attack Father   . Breast cancer Maternal Aunt   . Breast cancer Paternal Aunt     Vitals:   07/26/17 1413  BP: 96/64  Pulse: 89  SpO2: 94%  Weight: 168 lb 4 oz (76.3 kg)     PHYSICAL EXAM: General:  Tearful.  No resp difficulty. Arrived in wheelchair with her husband.  HEENT: normal Neck: supple. no JVD. Carotids 2+ bilat; no bruits. No lymphadenopathy or thryomegaly appreciated. Cor: PMI nondisplaced. Regular rate & rhythm. No rubs, gallops or murmurs. Lungs: clear Abdomen: soft, nontender, nondistended. No hepatosplenomegaly. No bruits or masses. Good bowel sounds. Extremities: no cyanosis, clubbing, rash, R and LLE orthotic shoes. With wounds wrapped Neuro: alert & orientedx3, cranial nerves grossly intact. moves all 4 extremities w/o difficulty. Affect pleasant   EKG: Possible Aflutter 91 bpm. Personally reviewed   ASSESSMENT & PLAN: 1. Chronic combined systolic and diastolic HF:  - TEE 04/12/17 LVEF 20-25%, Severely HK RV, Mod LAE, Severe RAE, Mild MR, Moderate TR, Trivial PI. Has previously refused ICD. EF previously 30-35%, NICM/ICM. Suspect that atrial fib/flutter has worsened CM. S/p DCCV in 03/2017.  DIfficult to know functional status due to leg pain.  Volume status stable. Continue  torsemide 40 mg BID.  - Continue Spiro 12.5 mg daily.  - Continue losartan 12.5 mg daily - SBP soft. Continue carvedilol 6.25 mg twice a day.   2. CAD- CABG in 2003. S/P LHC 10/18  1/3 grafts patent. No targets for intervention  - No S/S ischemia Total Cholesterol 153, HDL 31, LDL 98. TGs 122. Not on statin due to cramping. Declined Zetia.  - Continue ASA 81 mg daily.   3. PAF - had successful cardioversion 2016 in [place].  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  -Continue Eliquis for anticoagulation.   5. Hypothyroidsim - Continue synthroid  6. Obesity:  -Body mass index is 29.8 kg/m.  7. Suspected OSA - Set up sleep study down the road.   8. PAD- S/P Angiogram with DES to R popliteal 12/18. On plavix, asa. No statin due to allergy.  -now with non-healing wound in LLE. Pending possible L BKA per Dr. Imogene Burn.  Dr Gala Romney discussed with Dr Allyson Sabal.    Follow up in 4-6 weeks with Dr Gala Romney    Tonye Becket, NP 07/26/17    Patient seen and examined with Tonye Becket, NP. We discussed all aspects of the encounter. I agree with the assessment and plan as stated above.   Multiple active issues.   1) Seems to be back in AFL but rate controlled. Tolerating well. On Eliquis. Will continue amio for now. Can consider repeat DC-CV or ablation once wound issues dealt with 2) CHF. Remains NYHA III but volume status ok. BP soft> so unable to titrate meds at this point  3) PAD. Now with non-healing wound on LLE. Seen by Dr. Imogene Burn who told her she may need L BKA. I reviewed films with Dr. Allyson Sabal personally who feels as if she may be candidate for partial revascularization and perhaps limit her to toe amputation or transmetatarsal. Dr. Leanord Hawking will see her tomorrow and we have asked that he call Dr. Allyson Sabal to discuss afterward. I also d/w with [facility].   Total time spent 45 minutes. Over half that time spent discussing above.   Reuel Boom  Bensimhon, MD  5:37 PM

## 2017-07-26 NOTE — Patient Instructions (Signed)
Follow up 6 weeks with Dr. Gala RomneyBensimhon.  Take all medication as prescribed the day of your appointment. Bring all medications with you to your appointment.  Do the following things EVERYDAY: 1) Weigh yourself in the morning before breakfast. Write it down and keep it in a log. 2) Take your medicines as prescribed 3) Eat low salt foods-Limit salt (sodium) to 2000 mg per day.  4) Stay as active as you can everyday 5) Limit all fluids for the day to less than 2 liters

## 2017-07-27 ENCOUNTER — Telehealth: Payer: Self-pay | Admitting: Cardiovascular Disease

## 2017-07-27 ENCOUNTER — Encounter: Payer: Medicare Other | Admitting: Internal Medicine

## 2017-07-27 ENCOUNTER — Encounter: Payer: Self-pay | Admitting: Cardiovascular Disease

## 2017-07-27 DIAGNOSIS — I70235 Atherosclerosis of native arteries of right leg with ulceration of other part of foot: Secondary | ICD-10-CM | POA: Diagnosis not present

## 2017-07-27 NOTE — Telephone Encounter (Signed)
Left message on pt vm to call back. Appointment rescheduled to Friday, February 1 per Dr. Allyson SabalBerry. Will continue to try to reach pt throughout the day.

## 2017-07-29 ENCOUNTER — Encounter: Payer: Self-pay | Admitting: Cardiovascular Disease

## 2017-07-29 ENCOUNTER — Ambulatory Visit: Payer: Medicare Other | Admitting: Cardiovascular Disease

## 2017-07-29 ENCOUNTER — Other Ambulatory Visit: Payer: Self-pay | Admitting: Cardiovascular Disease

## 2017-07-29 VITALS — BP 98/62 | HR 93 | Ht 63.0 in | Wt 169.8 lb

## 2017-07-29 DIAGNOSIS — I998 Other disorder of circulatory system: Secondary | ICD-10-CM | POA: Diagnosis not present

## 2017-07-29 DIAGNOSIS — I70229 Atherosclerosis of native arteries of extremities with rest pain, unspecified extremity: Secondary | ICD-10-CM

## 2017-07-29 NOTE — Patient Instructions (Addendum)
   [facility] [address] [place] [address] Dept: [phone] Loc: [phone]  Jolyne LoaMary G Krakow  07/29/2017  You are scheduled for a Peripheral Angiogram on Monday, February 4 with Dr. Nanetta BattyJonathan Berry.  1. Please arrive at the [facility] at [facility]: [address] [place], [place] [address] at 5:30 AM (two hours before your procedure to ensure your preparation). Free valet parking service is available.   Special note: Every effort is made to have your procedure done on time. Please understand that emergencies sometimes delay scheduled procedures.  2. Diet: Do not eat or drink anything after midnight prior to your procedure except sips of water to take medications.  3. Labs: Please have labs drawn today in our office.  4. Medication instructions in preparation for your procedure:  Stop taking Eliquis (Apixiban) on Friday, February 1.  On the morning of your procedure, take your aspirin and Plavix/Clopidogrel and any morning medicines NOT listed above.  You may use sips of water.  5. Plan for one night stay--bring personal belongings. 6. Bring a current list of your medications and current insurance cards. 7. You MUST have a responsible person to drive you home. 8. Someone MUST be with you the first 24 hours after you arrive home or your discharge will be delayed. 9. Please wear clothes that are easy to get on and off and wear slip-on shoes.  Thank you for allowing us to care for you!   -- [facility]    Post-procedure Follow-up: 1 WEEK AFTER PROCEDURE: Your physician has requested that you have a lower extremity arterial duplex. During this test, ultrasound is used to evaluate arterial blood flow in the legs. Allow one hour for this exam. There are no restrictions or special instructions.  Your physician has requested that  you have an ankle brachial index (ABI). During this test an ultrasound and blood pressure cuff are used to evaluate the arteries that supply the arms and legs with blood. Allow thirty minutes for this exam. There are no restrictions or special instructions.  Your physician recommends that you schedule a follow-up appointment in: 2 weeks after procedure with Dr. Allyson SabalBerry.

## 2017-07-29 NOTE — Assessment & Plan Note (Signed)
Ms. [name] returns today for follow-up. I performed complex right lower extremity endovascular intervention on her 05/26/17 revealing a drug-eluting stent in her right below the knee popliteal artery and intervening on her peroneal artery for critical limb ischemia. Her right foot ischemic ulcers have almost completely healed. She does have a chronic ulcer on her left great toe and a new ischemic painful ulcer on her left third toe. She underwent angiography by Dr. Imogene Burnhen 07/14/17 and was told that there were no surgical or endovascular options and would need a left BKA. She saw Dr. Leanord Hawkingobson in the office who referred her back to me for consideration of tibial intervention.

## 2017-07-29 NOTE — Progress Notes (Signed)
07/29/2017 [name]   10-28-1945  [id number]  Primary Physician Burdine, Ananias Pilgrim, MD Primary Cardiologist: Runell Gess MD Nicholes Calamity, [place]  HPI:  [name] is a 72 y.o.  married Caucasian female mother of 3, grandmother of a grandchildren referred to me by Dr. Diona Browner and Burdine for evaluation and treatment of critical limb ischemia. She is a retired professor of Academic librarian. I last her in the office 06/10/17 which was approximately 2 weeks post right lower extremity intervention. She has ischemic cardiomyopathy status post bypass grafting injection of [place] present 14 years ago after a myocardial infarction. She has an EF in the 20-25% range with occluded vein grafts, chronic A. Fib on oral anticoagulation. She had outpatient DC cardioversion by Dr. Teressa Lower one week ago and is currently back in A. Fib on amiodarone and Eliquis . She developed pain in her right foot last Wednesday which progressed to redness. She is currently taking nonsteroidals every 4 hours for pain relief. Dopplers performed showed monophasic waveforms in her right popliteal artery. Because of a elevated serum creatinine in the mid-2 range she was admitted to [facility] on 05/26/17 for optimization of her renal function and hemodynamic status. Her Eliquis was withheld and she was placed on IV heparin. Her serum creatinine fell to a low of 1.28 today and she presents for angiography and potential intervention for critical limb ischemia. Since I saw her back approximately 6 weeks ago her right foot ischemic ulcers have healed. She has undergone angiography by Dr. Imogene Burn on 07/14/17 at the request of her PCP, Dr. Leandrew Koyanagi, revealing a patent stent in her right popliteal artery and a patent right peroneal. 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 Imogene Burn thought was not percutaneously or surgically addressable and suggested her only option was left BKA. She saw Dr. Leanord Hawking back in the  wound care clinic for further evaluation who referred her back to me for consideration of left below-the-knee intervention for limb salvage.    Current Meds  Medication Sig  . acetaminophen (TYLENOL) 500 MG tablet Take 1,000 mg by mouth daily as needed for moderate pain.   Marland Kitchen albuterol (PROVENTIL HFA;VENTOLIN HFA) 108 (90 Base) MCG/ACT inhaler Inhale 2 puffs into the lungs every 6 (six) hours as needed for wheezing or shortness of breath.   Marland Kitchen amiodarone (PACERONE) 200 MG tablet Take 1 tablet (200 mg total) daily by mouth.  Marland Kitchen apixaban (ELIQUIS) 5 MG TABS tablet Take 1 tablet (5 mg total) by mouth 2 (two) times daily.  Marland Kitchen aspirin 81 MG EC tablet Take 1 tablet (81 mg total) by mouth daily.  . carvedilol (COREG) 6.25 MG tablet Take 1 tablet (6.25 mg total) by mouth 2 (two) times daily.  . clopidogrel (PLAVIX) 75 MG tablet Take 1 tablet (75 mg total) by mouth daily.  Marland Kitchen HYDROcodone-acetaminophen (NORCO) 7.5-325 MG tablet Take 1 tablet by mouth every 6 (six) hours as needed (for pain.).   Marland Kitchen levothyroxine (SYNTHROID, LEVOTHROID) 175 MCG tablet Take 175 mcg by mouth daily before breakfast.  . losartan (COZAAR) 25 MG tablet Take 0.5 tablets (12.5 mg total) by mouth daily.  . Magnesium 250 MG TABS Take 250 mg by mouth daily.   . magnesium hydroxide (MILK OF MAGNESIA) 400 MG/5ML suspension Take 30 mLs by mouth daily as needed for mild constipation.  . nitroGLYCERIN (NITRODUR - DOSED IN MG/24 HR) 0.2 mg/hr patch PLACE 1 PATCH ONTO THE SKIN DAILY AS NEEDED  FOR CHEST PAINS  . oxymetazoline (AFRIN) 0.05 % nasal spray Place 1 spray into both nostrils daily as needed.   . pantoprazole (PROTONIX) 40 MG tablet Take 1 tablet (40 mg total) by mouth daily.  . potassium chloride (K-DUR,KLOR-CON) 10 MEQ tablet Take 2 tablets (20 mEq total) by mouth 2 (two) times daily.  . promethazine (PHENERGAN) 25 MG tablet Take 25 mg by mouth every 4 (four) hours as needed for nausea/vomiting.  . torsemide (DEMADEX) 20 MG tablet  Take 2 tablets (40 mg total) by mouth 2 (two) times daily. May also take 1 tablet (20 mg total) as needed (for 3lb weight gain in 24 hours).   Current Facility-Administered Medications for the 07/29/17 encounter (Office Visit) with Runell Gess, MD  Medication  . traMADol (ULTRAM) tablet 50 mg     Allergies  Allergen Reactions  . Oxycodone Other (See Comments)    hallucinations   . Pollen Extract Other (See Comments)    Sneezing, running nose.  . Ranexa [Ranolazine] Nausea Only    Headache and nausea  . Statins Other (See Comments)    Cramping, turns skin yellow    Social History   Socioeconomic History  . Marital status: Married    Spouse name: Not on file  . Number of children: Not on file  . Years of education: Not on file  . Highest education level: Not on file  Social Needs  . Financial resource strain: Not very hard  . Food insecurity - worry: Never true  . Food insecurity - inability: Never true  . Transportation needs - medical: No  . Transportation needs - non-medical: No  Occupational History  . Not on file  Tobacco Use  . Smoking status: Former Smoker    Packs/day: 1.00    Years: 21.00    Pack years: 21.00    Types: Cigarettes    Last attempt to quit: 1988    Years since quitting: 31.1  . Smokeless tobacco: Never Used  Substance and Sexual Activity  . Alcohol use: No  . Drug use: No  . Sexual activity: Not Currently    Partners: Male  Other Topics Concern  . Not on file  Social History Narrative  . Not on file     Review of Systems: General: negative for chills, fever, night sweats or weight changes.  Cardiovascular: negative for chest pain, dyspnea on exertion, edema, orthopnea, palpitations, paroxysmal nocturnal dyspnea or shortness of breath Dermatological: negative for rash Respiratory: negative for cough or wheezing Urologic: negative for hematuria Abdominal: negative for nausea, vomiting, diarrhea, bright red blood per rectum, melena,  or hematemesis Neurologic: negative for visual changes, syncope, or dizziness All other systems reviewed and are otherwise negative except as noted above.    Blood pressure 98/62, pulse 93, height 5\' 3"  (1.6 m), weight 169 lb 12.8 oz (77 kg).  General appearance: alert and mild distress Neck: no adenopathy, no carotid bruit, no JVD, supple, symmetrical, trachea midline and thyroid not enlarged, symmetric, no tenderness/mass/nodules Lungs: clear to auscultation bilaterally Heart: irregularly irregular rhythm Extremities: extremities normal, atraumatic, no cyanosis or edema Pulses: Diminished pedal pulses bilaterally Skin: Gangrenous ulcers left great toe and third toe Neurologic: Alert and oriented X 3, normal strength and tone. Normal symmetric reflexes. Normal coordination and gait  EKG not performed today  ASSESSMENT AND PLAN:   Critical lower limb ischemia [name] returns today for follow-up. I performed complex right lower extremity endovascular intervention on her 05/26/17 revealing a drug-eluting  stent in her right below the knee popliteal artery and intervening on her peroneal artery for critical limb ischemia. Her right foot ischemic ulcers have almost completely healed. She does have a chronic ulcer on her left great toe and a new ischemic painful ulcer on her left third toe. She underwent angiography by Dr. Imogene Burnhen 07/14/17 and was told that there were no surgical or endovascular options and would need a left BKA. She saw Dr. Leanord Hawkingobson in the office who referred her back to me for consideration of tibial intervention.      Runell GessJonathan J. Berry MD FACP,FACC,FAHA, [facility] 07/29/2017 11:59 AM

## 2017-07-30 LAB — BASIC METABOLIC PANEL
BUN/Creatinine Ratio: 16 (ref 12–28)
BUN: 19 mg/dL (ref 8–27)
CALCIUM: 9.2 mg/dL (ref 8.7–10.3)
CO2: 21 mmol/L (ref 20–29)
CREATININE: 1.18 mg/dL — AB (ref 0.57–1.00)
Chloride: 100 mmol/L (ref 96–106)
GFR calc Af Amer: 54 mL/min/{1.73_m2} — ABNORMAL LOW (ref >59)
GFR calc non Af Amer: 47 mL/min/{1.73_m2} — ABNORMAL LOW (ref >59)
GLUCOSE: 142 mg/dL — AB (ref 65–99)
Potassium: 3.6 mmol/L (ref 3.5–5.2)
SODIUM: 141 mmol/L (ref 134–144)

## 2017-07-30 LAB — CBC WITH DIFFERENTIAL/PLATELET
Basophils Absolute: 0 10*3/uL (ref 0.0–0.2)
Basos: 0 %
EOS (ABSOLUTE): 0.1 10*3/uL (ref 0.0–0.4)
Eos: 1 %
Hematocrit: 31.8 % — ABNORMAL LOW (ref 34.0–46.6)
Hemoglobin: 10.1 g/dL — ABNORMAL LOW (ref 11.1–15.9)
IMMATURE GRANS (ABS): 0 10*3/uL (ref 0.0–0.1)
IMMATURE GRANULOCYTES: 0 %
LYMPHS: 13 %
Lymphocytes Absolute: 1.2 10*3/uL (ref 0.7–3.1)
MCH: 29.2 pg (ref 26.6–33.0)
MCHC: 31.8 g/dL (ref 31.5–35.7)
MCV: 92 fL (ref 79–97)
MONOS ABS: 0.5 10*3/uL (ref 0.1–0.9)
Monocytes: 5 %
NEUTROS PCT: 81 %
Neutrophils Absolute: 7.7 10*3/uL — ABNORMAL HIGH (ref 1.4–7.0)
PLATELETS: 391 10*3/uL — AB (ref 150–379)
RBC: 3.46 x10E6/uL — AB (ref 3.77–5.28)
RDW: 15.9 % — AB (ref 12.3–15.4)
WBC: 9.5 10*3/uL (ref 3.4–10.8)

## 2017-07-30 LAB — PROTIME-INR
INR: 1.2 (ref 0.8–1.2)
PROTHROMBIN TIME: 12.6 s — AB (ref 9.1–12.0)

## 2017-07-30 LAB — APTT: APTT: 33 s (ref 24–33)

## 2017-07-31 NOTE — Progress Notes (Signed)
ANNIA, GOMM ([id number]) Visit Report for 07/27/2017 HPI Details Patient Name: [name], [name] Date of Service: 07/27/2017 12:30 PM Medical Record Number: [id number] Patient Account Number: [id number] Date of Birth/Sex: 24-Aug-1945 (72 y.o. Female) Treating RN: Ashok Cordia, Debi Primary Care Provider: Quintin Alto Other Clinician: Referring Provider: Quintin Alto Treating Provider/Extender: Altamese [place] in Treatment: 6 History of Present Illness Location: right foot toes Quality: Patient reports experiencing a sharp pain to affected area(s). Severity: Patient states wound are getting worse. Duration: Patient has had the wound for < 2 weeks prior to presenting for treatment Timing: Pain in wound is constant (hurts all the time) Context: The wound appeared gradually over time Modifying Factors: Other treatment(s) tried include:recent interventional process to place a stent in her right lower extremity for critical limb ischemia Associated Signs and Symptoms: Patient reports having foul odor. HPI Description: 72 year old female who was referred by Dr. Nanetta Batty, who performed a endovascular procedure on 05/26/2017, for critical limb ischemia. Her angiogram showed a 99% below the knee popliteal stenosis which is stented with a 4 mm drug eluting stent. On 06/10/2017 he noted that her pulses are palpable and her Dopplers have normalized. He referred her to wound care for further advice and put her on Keflex 500 mg twice daily for 10 days. She has a past medical history of asthma, coronary artery disease, CHF, hyperlipidemia, ischemic cardiomyopathy, peripheral arterial disease and type 2 diabetes mellitus. She is status post recent abdominal aortogram with right lower extremity stenting, cholecystectomy, CABG, parathyroidectomy, thyroidectomy. She is a former smoker and quit in 1988. She had an ABI performed on 06/10/2017 with the right ABI within normal range and the left  ABI shows mild lower extremity arterial disease. The right ABI was 1.0 and biphasic and the left ABI was 0.93 and biphasic. Toe pressures were abnormal with the right being 0.27 and left being 0.31. the duplex study done showed a patent right popliteal artery stent without evidence of focal stenosis or obstruction. of note the patient started having signs of critical limb ischemia somewhere at the end of November and after an urgent workup was taken up for limb salvage with a successfully placed stent by Dr. Nanetta Batty, with good arterial perfusion postprocedure with normal ABIs noted on 06/10/2017. 06/22/17; patient was admitted to our clinic last week by Dr. Meyer Russel.she is a type II diabetic. She had undergone a right popliteal DES on 11/29 for critical limb ischemia including erythema and pain of her right forefoot. She states about 3 days later she started developing ulcerations in predominantly the right foot but also the left third toe. 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 This is never really healed rib. Repeat ABIs on 12/14 showed a right ABI of 1 and a left ABI of 0.93 with toe pressures be markedly abnormal as noted above and Dr. Marcie Bal notes. The patient is still having a lot of pain making it very difficult for her to function at home this is worse on the right greater than left foot she does describe severe lancinating pain. She does not ambulate all that much and she finds it intolerable to wear footwear Her history is also notable for A. fib, severe cardiomyopathy with an ejection fraction of 25%_30%. Last echocardiogram in June of this year. According to Epic she is on a combination of Eliquis plavix and aspirin. 06/29/17; patient has an appointment with Dr. Imogene Burn of vascular surgery I believe  next week. This was arranged by her primary physician. She is still having a lot of pain in the right side in general most of her toes  look better however. Supposed to be using silver alginate however [facility] applying currently applied wet to dry dressings. 07/06/17; the patient went for appointment with Dr. Imogene Burn. She is status post right popliteal stenting with peroneal artery angioplasty with Dr. Gery Pray. She presented to our clinic with bilateral lower extremity wounds in her toes which are exceptionally painful. Dr. Imogene Burn put her on a course of antibiotics. Her arterial studies from 06/10/17 showed an ABI in the right of 1 and ABI in the left of 0.8. Waveforms at the dorsalis pedis and posterior tibial were biphasic. However routine ABIs were [name], [name] ([id number]) 0.31 bilaterally. The patient has severe bilateral rest pain. 07/20/17; the patient had her arteriogram by Dr. Imogene Burn on 07/14/17. Based on the images the patient was not felt to require any interventions and no further interventions were possible due to small size of the tibial arteries. The posterior tibial arteries bilaterally were occluded heel arteries were the dominant runoff which were miniscule. Anterior tibials both showed subtotal occlusion in the mid segment and these were small vessels. Paradoxically the patient complains of a lot less pain in the right foot however she is having a lot of pain in the left first and left third toes. She is managing this with activity limitation and narcotics. 07/27/17; I've spoken to Dr. Gery Pray today and he is going to do another arteriogram on the left leg early next week with possibility of retrograde arterial intervention. The patient is still having a lot of pain in the left third toe and left first toe which is worse when she puts her leg up i.e. lies in bed etc. 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 She has done better on the right which is the side where she had the DES stent placed in the right popliteal. She still has a wound on the medial aspect of the third toe and black eschar at the tip of the  fourth and fifth toes however most of the rest of the right foot looks better. Electronic Signature(s) Signed: 07/27/2017 4:49:25 PM By: Baltazar Najjar MD Entered By: Baltazar Najjar on 07/27/2017 14:02:57 [name], [name] ([id number]) -------------------------------------------------------------------------------- Physical Exam Details Patient Name: [name] Date of Service: 07/27/2017 12:30 PM Medical Record Number: [id number] Patient Account Number: [id number] Date of Birth/Sex: Oct 18, 1945 (72 y.o. Female) Treating RN: Ashok Cordia, Debi Primary Care Provider: Quintin Alto Other Clinician: Referring Provider: Quintin Alto Treating Provider/Extender: Maxwell Caul Weeks in Treatment: 6 Constitutional Sitting or standing Blood Pressure is within target range for patient.. Pulse regular and within target range for patient.Marland Kitchen Respirations regular, non-labored and within target range.. Temperature is normal and within the target range for the patient.Marland Kitchen appears in no distress. Patient looks uncomfortable. Eyes Conjunctivae clear. No discharge. Respiratory Respiratory effort is easy and symmetric bilaterally. Rate is normal at rest and on room air.. Bilateral breath sounds are clear and equal in all lobes with no wheezes, rales or rhonchi.. Cardiovascular Heart rhythm and rate regular, without murmur or gallop. No CHF. Pedal pulses not palpable bilaterally. Lymphatic None palpable in the popliteal or inguinal area. Integumentary (Hair, Skin) Left third toe is erythematous and told the dry gangrene tip dorsally. Psychiatric No evidence of depression, anxiety, or agitation. Calm, cooperative, and communicative. Appropriate interactions and affect.. Notes Wound exam; the right foot continues  to look better. She still has black eschar at the tip of the right fifth and fourth toes and open wound on the medial third toe. We are using silver alginate all of this. She is not  complaining of pain oThe real problem currently is on the dorsal aspect of the left third toe from the DIP joint to the tip which has dry gangrene. She also has worsening areas at the tip of the tip of the left first toe and also the medial aspect of the third toe. These wounds look as though they're starting to liquefy Electronic Signature(s) Signed: 07/27/2017 4:49:25 PM By: Baltazar Najjar MD Entered By: Baltazar Najjar on 07/27/2017 14:06:02 [name], [name] ([id number]) -------------------------------------------------------------------------------- Physician Orders Details Patient Name: [name] Date of Service: 07/27/2017 12:30 PM Medical Record Number: [id number] Patient Account Number: [id number] Date of Birth/Sex: 1945-11-01 (71 y.o. Female) Treating RN: Ashok Cordia, Debi Primary Care Provider: Quintin Alto Other Clinician: Referring Provider: Quintin Alto Treating Provider/Extender: Altamese [place] in Treatment: 6 Verbal / Phone Orders: Yes Clinician: Pinkerton, Debi Read Back and Verified: Yes Diagnosis Coding Wound Cleansing Wound #1 Right,Distal Toe - Web between 2nd and 3rd o Clean wound with Normal Saline. Wound #2 Right Toe - Web between 3rd and 4th o Clean wound with Normal Saline. Wound #4 Right,Dorsal Toe Fifth o Clean wound with Normal Saline. Wound #5 Left,Distal Toe Great o Clean wound with Normal Saline. Wound #6 Left,Lateral Toe Great o Clean wound with Normal Saline. Wound #7 Left Toe Third o Clean wound with Normal Saline. Anesthetic (add to Medication List) Wound #1 Right,Distal Toe - Web between 2nd and 3rd o Topical Lidocaine 4% cream applied to wound bed prior to debridement (In Clinic Only). Wound #2 Right Toe - Web between 3rd and 4th o Topical Lidocaine 4% cream applied to wound bed prior to debridement (In Clinic Only). Wound #4 Right,Dorsal Toe Fifth o Topical Lidocaine 4% cream applied to wound bed prior  to debridement (In Clinic Only). Wound #5 Left,Distal Toe Great o Topical Lidocaine 4% cream applied to wound bed prior to debridement (In Clinic Only). Wound #6 Left,Lateral Toe Great o Topical Lidocaine 4% cream applied to wound bed prior to debridement (In Clinic Only). Wound #7 Left Toe Third o Topical Lidocaine 4% cream applied to wound bed prior to debridement (In Clinic Only). Primary Wound Dressing Wound #1 Right,Distal Toe - Web between 2nd and 3rd o Other: - silvercel between toes down to base of web and cover wounds with silvercel strip Wound #2 Right Toe - Web between 3rd and 4th o Other: - silvercel between toes down to base of web between toes [name], [name] ([id number]) Wound #4 Right,Dorsal Toe Fifth o Other: - silvercel between toes down to base of web between toes Wound #5 Left,Distal Toe Great o Other: - silvercel between toes down to base of web between toes Wound #6 Left,Lateral Toe Great o Other: - silvercel between toes down to base of web between toes Wound #7 Left Toe Third o Other: - silvercel between toes down to base of web between toes Secondary Dressing Wound #1 Right,Distal Toe - Web between 2nd and 3rd o ABD pad o Conform/Kerlix Wound #2 Right Toe - Web between 3rd and 4th o ABD pad o Conform/Kerlix Wound #4 Right,Dorsal Toe Fifth o ABD pad o Conform/Kerlix Wound #5 Left,Distal Toe Great o ABD pad o Conform/Kerlix Wound #6 Left,Lateral Toe Great o ABD pad o Conform/Kerlix Wound #7 Left Toe Third   o ABD pad o Conform/Kerlix Dressing Change Frequency Wound #1 Right,Distal Toe - Web between 2nd and 3rd o Change dressing every day. Wound #2 Right Toe - Web between 3rd and 4th o Change dressing every day. Wound #4 Right,Dorsal Toe Fifth o Change dressing every day. Wound #5 Left,Distal Toe Great o Change dressing every day. Wound #6 Left,Lateral Toe Great o Change dressing every  day. Wound #7 Left Toe Third o Change dressing every day. [name], [name] ([id number]) Follow-up Appointments Wound #1 Right,Distal Toe - Web between 2nd and 3rd o Return Appointment in 1 week. Wound #2 Right Toe - Web between 3rd and 4th o Return Appointment in 1 week. Wound #4 Right,Dorsal Toe Fifth o Return Appointment in 1 week. Wound #5 Left,Distal Toe Great o Return Appointment in 1 week. Wound #6 Left,Lateral Toe Great o Return Appointment in 1 week. Wound #7 Left Toe Third o Return Appointment in 1 week. Home Health Wound #1 Right,Distal Toe - Web between 2nd and 3rd o Continue Home Health Visits o Home Health Nurse may visit PRN to address patientos wound care needs. o FACE TO FACE ENCOUNTER: MEDICARE and MEDICAID PATIENTS: I certify that this patient is under my care and that I had a face-to-face encounter that meets the physician face-to-face encounter requirements with this patient on this date. The encounter with the patient was in whole or in part for the following MEDICAL CONDITION: (primary reason for Home Healthcare) MEDICAL NECESSITY: I certify, that based on my findings, NURSING services are a medically necessary home health service. HOME BOUND STATUS: I certify that my clinical findings support that this patient is homebound (i.e., Due to illness or injury, pt requires aid of supportive devices such as crutches, cane, wheelchairs, walkers, the use of special transportation or the assistance of another person to leave their place of residence. There is a normal inability to leave the home and doing so requires considerable and taxing effort. Other absences are for medical reasons / religious services and are infrequent or of short duration when for other reasons). o If current dressing causes regression in wound condition, may D/C ordered dressing product/s and apply Normal Saline Moist Dressing daily until next Wound Healing Center / Other MD  appointment. Notify Wound Healing Center of regression in wound condition at [phone]. o Please direct any NON-WOUND related issues/requests for orders to patient's Primary Care Physician Wound #2 Right Toe - Web between 3rd and 4th o Continue Home Health Visits o Home Health Nurse may visit PRN to address patientos wound care needs. o FACE TO FACE ENCOUNTER: MEDICARE and MEDICAID PATIENTS: I certify that this patient is under my care and that I had a face-to-face encounter that meets the physician face-to-face encounter requirements with this patient on this date. The encounter with the patient was in whole or in part for the following MEDICAL CONDITION: (primary reason for Home Healthcare) MEDICAL NECESSITY: I certify, that based on my findings, NURSING services are a medically necessary home health service. HOME BOUND STATUS: I certify that my clinical findings support that this patient is homebound (i.e., Due to illness or injury, pt requires aid of supportive devices such as crutches, cane, wheelchairs, walkers, the use of special transportation or the assistance of another person to leave their place of residence. There is a normal inability to leave the home and doing so requires considerable and taxing effort. Other absences are for medical reasons / religious services and are infrequent or of short duration when  for other reasons). o If current dressing causes regression in wound condition, may D/C ordered dressing product/s and apply Normal Saline Moist Dressing daily until next Wound Healing Center / Other MD appointment. Notify Wound Healing Center of regression in wound condition at [phone]. o Please direct any NON-WOUND related issues/requests for orders to patient's Primary Care Physician Wound #4 Right,Dorsal Toe Fifth [name], [name] ([id number]) o Continue Home Health Visits o Home Health Nurse may visit PRN to address patientos wound care needs. o  FACE TO FACE ENCOUNTER: MEDICARE and MEDICAID PATIENTS: I certify that this patient is under my care and that I had a face-to-face encounter that meets the physician face-to-face encounter requirements with this patient on this date. The encounter with the patient was in whole or in part for the following MEDICAL CONDITION: (primary reason for Home Healthcare) MEDICAL NECESSITY: I certify, that based on my findings, NURSING services are a medically necessary home health service. HOME BOUND STATUS: I certify that my clinical findings support that this patient is homebound (i.e., Due to illness or injury, pt requires aid of supportive devices such as crutches, cane, wheelchairs, walkers, the use of special transportation or the assistance of another person to leave their place of residence. There is a normal inability to leave the home and doing so requires considerable and taxing effort. Other absences are for medical reasons / religious services and are infrequent or of short duration when for other reasons). o If current dressing causes regression in wound condition, may D/C ordered dressing product/s and apply Normal Saline Moist Dressing daily until next Wound Healing Center / Other MD appointment. Notify Wound Healing Center of regression in wound condition at [phone]. o Please direct any NON-WOUND related issues/requests for orders to patient's Primary Care Physician Wound #5 Left,Distal Toe Doretha Sou Continue Home Health Visits o Home Health Nurse may visit PRN to address patientos wound care needs. o FACE TO FACE ENCOUNTER: MEDICARE and MEDICAID PATIENTS: I certify that this patient is under my care and that I had a face-to-face encounter that meets the physician face-to-face encounter requirements with this patient on this date. The encounter with the patient was in whole or in part for the following MEDICAL CONDITION: (primary reason for Home Healthcare) MEDICAL NECESSITY: I  certify, that based on my findings, NURSING services are a medically necessary home health service. HOME BOUND STATUS: I certify that my clinical findings support that this patient is homebound (i.e., Due to illness or injury, pt requires aid of supportive devices such as crutches, cane, wheelchairs, walkers, the use of special transportation or the assistance of another person to leave their place of residence. There is a normal inability to leave the home and doing so requires considerable and taxing effort. Other absences are for medical reasons / religious services and are infrequent or of short duration when for other reasons). o If current dressing causes regression in wound condition, may D/C ordered dressing product/s and apply Normal Saline Moist Dressing daily until next Wound Healing Center / Other MD appointment. Notify Wound Healing Center of regression in wound condition at [phone]. o Please direct any NON-WOUND related issues/requests for orders to patient's Primary Care Physician Wound #6 Left,Lateral Toe Doretha Sou Continue Home Health Visits o Home Health Nurse may visit PRN to address patientos wound care needs. o FACE TO FACE ENCOUNTER: MEDICARE and MEDICAID PATIENTS: I certify that this patient is under my care and that I had a face-to-face encounter that meets the physician  face-to-face encounter requirements with this patient on this date. The encounter with the patient was in whole or in part for the following MEDICAL CONDITION: (primary reason for Home Healthcare) MEDICAL NECESSITY: I certify, that based on my findings, NURSING services are a medically necessary home health service. HOME BOUND STATUS: I certify that my clinical findings support that this patient is homebound (i.e., Due to illness or injury, pt requires aid of supportive devices such as crutches, cane, wheelchairs, walkers, the use of special transportation or the assistance of another person  to leave their place of residence. There is a normal inability to leave the home and doing so requires considerable and taxing effort. Other absences are for medical reasons / religious services and are infrequent or of short duration when for other reasons). o If current dressing causes regression in wound condition, may D/C ordered dressing product/s and apply Normal Saline Moist Dressing daily until next Wound Healing Center / Other MD appointment. Notify Wound Healing Center of regression in wound condition at [phone]. o Please direct any NON-WOUND related issues/requests for orders to patient's Primary Care Physician Wound #7 Left Toe Third o Continue Home Health Visits o Home Health Nurse may visit PRN to address patientos wound care needs. o FACE TO FACE ENCOUNTER: MEDICARE and MEDICAID PATIENTS: I certify that this patient is under my care and that I had a face-to-face encounter that meets the physician face-to-face encounter requirements with this [name], [name]. ([id number]) patient on this date. The encounter with the patient was in whole or in part for the following MEDICAL CONDITION: (primary reason for Home Healthcare) MEDICAL NECESSITY: I certify, that based on my findings, NURSING services are a medically necessary home health service. HOME BOUND STATUS: I certify that my clinical findings support that this patient is homebound (i.e., Due to illness or injury, pt requires aid of supportive devices such as crutches, cane, wheelchairs, walkers, the use of special transportation or the assistance of another person to leave their place of residence. There is a normal inability to leave the home and doing so requires considerable and taxing effort. Other absences are for medical reasons / religious services and are infrequent or of short duration when for other reasons). o If current dressing causes regression in wound condition, may D/C ordered dressing product/s and  apply Normal Saline Moist Dressing daily until next Wound Healing Center / Other MD appointment. Notify Wound Healing Center of regression in wound condition at [phone]. o Please direct any NON-WOUND related issues/requests for orders to patient's Primary Care Physician Patient Medications Allergies: oxycodone, Ranexa, Statins-Hmg-Coa Reductase Inhibitors Notifications Medication Indication Start End lidocaine DOSE 1 - topical 4 % cream - 1 cream topical Electronic Signature(s) Signed: 07/27/2017 4:49:25 PM By: Baltazar Najjar MD Signed: 07/29/2017 4:17:47 PM By: Alejandro Mulling Entered By: Alejandro Mulling on 07/27/2017 13:12:25 [place], [name] ([id number]) -------------------------------------------------------------------------------- Prescription 07/27/2017 Patient Name: [name] Provider: Maxwell Caul MD Date of Birth: 06/19/1946 NPI#: [id number] Sex: F DEA#: [id number] Phone #: [phone] License #: [id number] Patient Address: [facility] [address] [facility] [place], [place] [address] [address], [address] [place], [place] [address] [phone] Allergies oxycodone Ranexa Statins-Hmg-Coa Reductase Inhibitors Medication Medication: Route: Strength: Form: lidocaine topical 4% cream Class: TOPICAL LOCAL ANESTHETICS Dose: Frequency / Time: Indication: 1 1 cream topical Number of Refills: Number of Units: 0 Generic Substitution: Start Date: End Date: Administered at Substitution Permitted Facility: Yes Time Administered: Time Discontinued:  Note to Pharmacy: [facility]): Date(s): Electronic Signature(s) ARYANI, DAFFERN ([id number]) Signed: 07/27/2017 4:49:25 PM By: Baltazar Najjar MD Signed: 07/29/2017 4:17:47 PM By: Alejandro Mulling Entered By: Alejandro Mulling on 07/27/2017 13:12:28 [name], [name]  ([id number]) --------------------------------------------------------------------------------  Problem List Details Patient Name: [name] Date of Service: 07/27/2017 12:30 PM Medical Record Number: [id number] Patient Account Number: [id number] Date of Birth/Sex: July 11, 1945 (72 y.o. 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 Female) Treating RN: Ashok Cordia, Debi Primary Care Provider: Quintin Alto Other Clinician: Referring Provider: Quintin Alto Treating Provider/Extender: Altamese [place] in Treatment: 6 Subjective History of Present Illness (HPI) The following HPI elements were documented for the patient's wound: Location: right foot toes Quality: Patient reports experiencing a sharp pain to affected area(s). Severity: Patient states wound are getting worse. Duration: Patient has had the wound for < 2 weeks prior to presenting for treatment Timing:  Pain in wound is constant (hurts all the time) Context: The wound appeared gradually over time Modifying Factors: Other treatment(s) tried include:recent interventional process to place a stent in her right lower extremity for critical limb ischemia Associated Signs and Symptoms: Patient reports having foul odor. 72 year old female who was referred by Dr. Nanetta Batty, who performed a endovascular procedure on 05/26/2017, for critical limb ischemia. Her angiogram showed a 99% below the knee popliteal stenosis which is stented with a 4 mm drug eluting stent. On 06/10/2017 he noted that her pulses are palpable and her Dopplers have normalized. He referred her to wound care for further advice and put her on Keflex 500 mg twice daily for 10 days. She has a past medical history of asthma, coronary artery disease, CHF, hyperlipidemia, ischemic cardiomyopathy, peripheral arterial disease and type 2 diabetes mellitus. She is status post recent abdominal aortogram with right lower extremity stenting, cholecystectomy, CABG, parathyroidectomy, thyroidectomy. She is a former smoker and quit in 1988. 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 The right ABI was 1.0 and biphasic and the left ABI was 0.93 and biphasic. Toe pressures were abnormal with the right being 0.27 and left being 0.31. the duplex study done showed a patent right popliteal artery stent without evidence of focal stenosis or obstruction. of note the patient started having signs of critical limb ischemia somewhere at the end of November and after an urgent workup was taken up for limb salvage with a successfully placed stent by Dr. Nanetta Batty, with good arterial perfusion postprocedure with normal ABIs noted on 06/10/2017. 06/22/17; patient was admitted to our clinic last week by Dr. Meyer Russel.she is a type II diabetic. She had undergone a  right popliteal DES on 11/29 for critical limb  ischemia including erythema and pain of her right forefoot. She states about 3 days later she started developing ulcerations in predominantly the right foot but also the left third toe. 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 This is never really healed rib. Repeat ABIs on 12/14 showed a right ABI of 1 and a left ABI of 0.93 with toe pressures be markedly abnormal as noted above and Dr. Marcie Bal notes. The patient is still having a lot of pain making it very difficult for her to function at home this is worse on the right greater than left foot she does describe severe lancinating pain. She does not ambulate all that much and she finds it intolerable to wear footwear Her history is also notable for A. fib, severe cardiomyopathy with an ejection fraction of 25%_30%. Last echocardiogram in June of this year. According to Epic she is on a combination of Eliquis plavix and aspirin. 06/29/17; patient has an appointment with Dr. Imogene Burn of vascular surgery I believe next week. This was arranged by her primary physician. She is still having a lot of pain in the right side in general most of her toes look better however. Supposed to be using silver alginate however [facility] applying currently applied wet to dry dressings. 07/06/17; the patient went for appointment with Dr. Imogene Burn. She is status post right popliteal stenting with peroneal artery angioplasty with Dr. Gery Pray. She presented to our clinic with bilateral lower extremity wounds in her toes which are exceptionally painful. Dr. Imogene Burn put her on a course of antibiotics. Her arterial studies from 06/10/17 showed an ABI in the right of 1 and ABI in the left of 0.8. Waveforms at the dorsalis pedis and posterior tibial were biphasic. However routine ABIs were Buerger, Deloras G. ([id number]) 0.31 bilaterally. The patient has severe bilateral rest pain. 07/20/17;  the patient had her arteriogram by Dr. Imogene Burn on 07/14/17. Based on the images the patient was not felt to require any interventions and no further interventions were possible due to small size of the tibial arteries. The posterior tibial arteries bilaterally were occluded heel arteries were the dominant runoff which were miniscule. Anterior tibials both showed subtotal occlusion in the mid segment and these were small vessels. Paradoxically the patient complains of a lot less pain in the right foot however she is having a lot of pain in the left first and left third toes. She is managing this with activity limitation and narcotics. 07/27/17; I've spoken to Dr. Gery Pray today and he is going to do another arteriogram on the left leg early next week with possibility of retrograde arterial intervention. The patient is still having a lot of pain in the left third toe and left first toe which is worse when she puts her leg up i.e. lies in bed etc. 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 She has done better on the right which is the side where she had the DES stent placed in the right popliteal. She still has a wound on the medial aspect of the third toe and black eschar at the tip of the fourth and fifth toes however most of the rest of the right foot looks better. Objective Constitutional Sitting or standing Blood Pressure is within target range for patient.. Pulse regular and within target range for patient.Marland Kitchen Respirations regular, non-labored and within target range.. Temperature is normal and within the target range for the patient.Marland Kitchen appears in no distress.  Patient looks uncomfortable. Vitals Time Taken: 12:59 PM, Height: 63 in, Weight: 176 lbs, BMI: 31.2, Temperature: 98.3 F, Pulse: 88 bpm, Respiratory Rate: 18 breaths/min, Blood Pressure: 103/57 mmHg. Eyes Conjunctivae clear. No discharge. Respiratory Respiratory effort is easy and symmetric bilaterally. Rate is normal at rest and on room air..  Bilateral breath sounds are clear and equal in all lobes with no wheezes, rales or rhonchi.. Cardiovascular Heart rhythm and rate regular, without murmur or gallop. No CHF. Pedal pulses not palpable bilaterally. Lymphatic None palpable in the popliteal or inguinal area. Psychiatric No evidence of depression, anxiety, or agitation. Calm, cooperative, and communicative. Appropriate interactions and affect.. General Notes: Wound exam; the right foot continues to look better. She still has black eschar at the tip of the right fifth and fourth toes and open wound on the medial third toe. We are using silver alginate all of this. She is not complaining of pain The real problem currently is on the dorsal aspect of the left third toe from the DIP joint to the tip which has dry gangrene. She also has worsening areas at the tip of the tip of the left first toe and also the medial aspect of the third toe. These wounds look as though they're starting to liquefy Integumentary (Hair, Skin) Left third toe is erythematous and told the dry gangrene tip dorsally. Wound #1 status is Open. Original cause of wound was Gradually Appeared. The wound is located on the [address] Toe - Zumstein, [name] ([id number]) Web between 2nd and 3rd. The wound measures 0.8cm length x 0.6cm width x 0.1cm depth; 0.377cm^2 area and 0.038cm^3 volume. There is no tunneling or undermining noted. There is a large amount of serous drainage noted. Foul odor after cleansing was noted. The wound margin is distinct with the outline attached to the wound base. There is no granulation within the wound bed. There is a large (67-100%) amount of necrotic tissue within the wound bed including Adherent Slough. The periwound skin appearance exhibited: Maceration. Periwound temperature was noted as No Abnormality. The periwound has tenderness on palpation. Wound #2 status is Open. Original cause of wound was Pressure Injury. The wound is located on  the Right Toe - Web between 3rd and 4th. The wound measures 0.5cm length x 1cm width x 0.1cm depth; 0.393cm^2 area and 0.039cm^3 volume. There is no tunneling or undermining noted. There is a large amount of serous drainage noted. Foul odor after cleansing was noted. The wound margin is distinct with the outline attached to the wound base. There is no granulation within the wound bed. There is a large (67-100%) amount of necrotic tissue within the wound bed including Eschar and Adherent Slough. The periwound skin appearance exhibited: Maceration. Periwound temperature was noted as No Abnormality. The periwound has tenderness on palpation. Wound #4 status is Open. Original cause of wound was Gradually Appeared. The wound is located on the Right,Dorsal Toe Fifth. The wound measures 0.3cm length x 0.7cm width x 0.1cm depth; 0.165cm^2 area and 0.016cm^3 volume. There is no tunneling or undermining noted. There is a small amount of serous drainage noted. The wound margin is flat and intact. There is no granulation within the wound bed. There is a large (67-100%) amount of necrotic tissue within the wound bed including Eschar. The periwound skin appearance did not exhibit: Callus, Crepitus, Excoriation, Induration, Rash, Scarring, Dry/Scaly, Maceration, Atrophie Blanche, Cyanosis, Ecchymosis, Hemosiderin Staining, Mottled, Pallor, Rubor, Erythema. Periwound temperature was noted as No Abnormality. The periwound has tenderness on  palpation. Wound #5 status is Open. Original cause of wound was Gradually Appeared. The wound is located on the [employer]. The wound measures 2.5cm length x 1cm width x 0.1cm depth; 1.963cm^2 area and 0.196cm^3 volume. There is no tunneling or undermining noted. There is a large amount of serous drainage noted. Foul odor after cleansing was noted. The wound margin is flat and intact. There is no granulation within the wound bed. There is a large (67-100%) amount of  necrotic tissue within the wound bed including Eschar. The periwound skin appearance exhibited: Callus, Induration, Scarring. The periwound skin appearance did not exhibit: Crepitus, Excoriation, Rash, Dry/Scaly, Maceration, Atrophie Blanche, Cyanosis, Ecchymosis, Hemosiderin Staining, Mottled, Pallor, Rubor, Erythema. Periwound temperature was noted as No Abnormality. The periwound has tenderness on palpation. Wound #6 status is Open. Original cause of wound was Gradually Appeared. The wound is located on the [employer]. The wound measures 1.2cm length x 1.6cm width x 0.1cm depth; 1.508cm^2 area and 0.151cm^3 volume. There is no tunneling noted. There is a large amount of serosanguineous drainage noted. Foul odor after cleansing was noted. The wound margin is distinct with the outline attached to the wound base. There is no granulation within the wound bed. There is a large (67-100%) amount of necrotic tissue within the wound bed including Eschar and Adherent Slough. The periwound skin appearance exhibited: Callus, Crepitus, Induration, Maceration. The periwound skin appearance did not exhibit: Excoriation, Rash, Scarring, Dry/Scaly, Atrophie Blanche, Cyanosis, Ecchymosis, Hemosiderin Staining, Mottled, Pallor, Rubor, Erythema. Periwound temperature was noted as No Abnormality. The periwound has tenderness on palpation. Wound #7 status is Open. Original cause of wound was Gradually Appeared. The wound is located on the Left Toe Third. The wound measures 1.5cm length x 1.9cm width x 0.1cm depth; 2.238cm^2 area and 0.224cm^3 volume. There is no tunneling or undermining noted. There is a large amount of purulent drainage noted. Foul odor after cleansing was noted. The wound margin is distinct with the outline attached to the wound base. There is no granulation within the wound bed. There is a large (67-100%) amount of necrotic tissue within the wound bed including Eschar. The periwound  skin appearance exhibited: Callus. The periwound skin appearance did not exhibit: Crepitus, Excoriation, Induration, Rash, Scarring, Dry/Scaly, Maceration, Atrophie Blanche, Cyanosis, Ecchymosis, Hemosiderin Staining, Mottled, Pallor, Rubor, Erythema. Periwound temperature was noted as No Abnormality. The periwound has tenderness on palpation. 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 Wound #2 Right Toe - Web between 3rd and 4th: Clean wound with Normal Saline. Wound #4 Right,Dorsal Toe Fifth: Clean wound with Normal Saline. Wound #5 Left,Distal Toe Great: Clean wound with Normal Saline. Wound #6 Left,Lateral Toe Great: Clean wound with Normal Saline. Wound #7 Left Toe Third: Clean wound with Normal Saline. Anesthetic (add to Medication List): Wound #1 Right,Distal Toe - Web between 2nd and 3rd: Topical Lidocaine 4% cream applied to wound bed prior to debridement (In Clinic Only). Wound #2 Right Toe - Web between 3rd and 4th: Topical Lidocaine 4% cream applied to wound bed prior to debridement (In Clinic Only). Wound #4 Right,Dorsal Toe Fifth: Topical Lidocaine 4% cream  applied to wound bed prior to debridement (In Clinic Only). Wound #5 Left,Distal Toe Great: Topical Lidocaine 4% cream applied to wound bed prior to debridement (In Clinic Only). Wound #6 Left,Lateral Toe Great: Topical Lidocaine 4% cream applied to wound bed prior to debridement (In Clinic Only). Wound #7 Left Toe Third: Topical Lidocaine 4% cream applied to wound bed prior to debridement (In Clinic  Only). Primary Wound Dressing: Wound #1 Right,Distal Toe - Web between 2nd and 3rd: Other: - silvercel between toes down to base of web and cover wounds with silvercel strip Wound #2 Right Toe - Web between 3rd and 4th: Other: - silvercel between toes down to base of web between toes Wound #4 Right,Dorsal Toe Fifth: Other: - silvercel between toes down to base of web between toes Wound #5 Left,Distal Toe Great: Other: - silvercel between toes down to base of web between toes Wound #6 Left,Lateral Toe Great: Other: - silvercel between toes down to base of web between toes Wound #7 Left Toe Third: Other: - silvercel between toes down to base of web between toes Secondary Dressing: Wound #1 Right,Distal Toe - Web between 2nd and 3rd: ABD pad Conform/Kerlix Wound #2 Right Toe - Web between 3rd and 4th: ABD pad Conform/Kerlix Wound #4 Right,Dorsal Toe Fifth: ABD pad Conform/Kerlix [name], [name] ([id number]) Wound #5 Left,Distal Toe Great: ABD pad Conform/Kerlix Wound #6 Left,Lateral Toe Great: ABD pad Conform/Kerlix Wound #7 Left Toe Third: ABD pad Conform/Kerlix Dressing Change Frequency: Wound #1 Right,Distal Toe - Web between 2nd and 3rd: Change dressing every day. Wound #2 Right Toe - Web between 3rd and 4th: Change dressing every day. Wound #4 Right,Dorsal Toe Fifth: Change dressing every day. Wound #5 Left,Distal Toe Great: Change dressing every day. Wound #6 Left,Lateral Toe Great: Change dressing every day. Wound #7 Left Toe Third: Change dressing every day. Follow-up Appointments: Wound #1 Right,Distal Toe - Web between 2nd and 3rd: Return Appointment in 1 week. Wound #2 Right Toe - Web between 3rd and 4th: Return Appointment in 1 week. Wound #4 Right,Dorsal Toe Fifth: Return Appointment in 1 week. Wound #5 Left,Distal Toe Great: Return Appointment in 1 week. Wound #6 Left,Lateral Toe Great: Return Appointment in 1 week. Wound #7 Left Toe Third: Return  Appointment in 1 week. Home Health: Wound #1 Right,Distal Toe - Web between 2nd and 3rd: Continue Home Health Visits Home Health Nurse may visit PRN to address patient s wound care needs. FACE TO FACE ENCOUNTER: MEDICARE and MEDICAID PATIENTS: I certify that this patient is under my care and that I had a face-to-face encounter that meets the physician face-to-face encounter requirements with this patient on this date. The encounter with the patient was in whole or in part for the following MEDICAL CONDITION: (primary reason for Home Healthcare) MEDICAL NECESSITY: I certify, that based on my findings, NURSING services are a medically necessary home health service. HOME BOUND STATUS: I certify that my clinical findings support that this patient is homebound (i.e., Due to illness or injury, pt requires aid of supportive devices such as crutches, cane, wheelchairs, walkers, the use of special transportation or the assistance of another person to leave their place of residence. There is a normal inability to leave the home and doing so requires considerable and taxing effort. Other absences are for medical reasons / religious services and are infrequent or of short duration when for other reasons). If current dressing causes regression in wound condition, may D/C ordered dressing product/s and apply Normal Saline  Moist Dressing daily until next Wound Healing Center / Other MD appointment. Notify Wound Healing Center of regression in wound condition at [phone]. Please direct any NON-WOUND related issues/requests for orders to patient's Primary Care Physician Wound #2 Right Toe - Web between 3rd and 4th: Continue Home Health Visits Home Health Nurse may visit PRN to address patient s wound care needs. FACE TO FACE ENCOUNTER: MEDICARE and MEDICAID PATIENTS: I certify that this patient is under my care and that I had a face-to-face encounter that meets the physician face-to-face encounter  requirements with this patient on this date. The encounter with the patient was in whole or in part for the following MEDICAL CONDITION: (primary reason for Home Healthcare) MEDICAL NECESSITY: I certify, that based on my findings, NURSING services are a medically necessary home health service. HOME BOUND STATUS: I certify that my clinical findings support that this patient is homebound (i.e., Due to [facility], TREASE BREMNER. ([id number]) illness or injury, pt requires aid of supportive devices such as crutches, cane, wheelchairs, walkers, the use of special transportation or the assistance of another person to leave their place of residence. There is a normal inability to leave the home and doing so requires considerable and taxing effort. Other absences are for medical reasons / religious services and are infrequent or of short duration when for other reasons). If current dressing causes regression in wound condition, may D/C ordered dressing product/s and apply Normal Saline Moist Dressing daily until next Wound Healing Center / Other MD appointment. Notify Wound Healing Center of regression in wound condition at [phone]. Please direct any NON-WOUND related issues/requests for orders to patient's Primary Care Physician Wound #4 Right,Dorsal Toe Fifth: Continue Home Health Visits Home Health Nurse may visit PRN to address patient s wound care needs. FACE TO FACE ENCOUNTER: MEDICARE and MEDICAID PATIENTS: I certify that this patient is under my care and that I had a face-to-face encounter that meets the physician face-to-face encounter requirements with this patient on this date. The encounter with the patient was in whole or in part for the following MEDICAL CONDITION: (primary reason for Home Healthcare) MEDICAL NECESSITY: I certify, that based on my findings, NURSING services are a medically necessary home health service. HOME BOUND STATUS: I certify that my clinical findings support that this  patient is homebound (i.e., Due to illness or injury, pt requires aid of supportive devices such as crutches, cane, wheelchairs, walkers, the use of special transportation or the assistance of another person to leave their place of residence. There is a normal inability to leave the home and doing so requires considerable and taxing effort. Other absences are for medical reasons / religious services and are infrequent or of short duration when for other reasons). If current dressing causes regression in wound condition, may D/C ordered dressing product/s and apply Normal Saline Moist Dressing daily until next Wound Healing Center / Other MD appointment. Notify Wound Healing Center of regression in wound condition at [phone]. Please direct any NON-WOUND related issues/requests for orders to patient's Primary Care Physician Wound #5 Left,Distal Toe Great: Continue Home Health Visits Home Health Nurse may visit PRN to address patient s wound care needs. FACE TO FACE ENCOUNTER: MEDICARE and MEDICAID PATIENTS: I certify that this patient is under my care and that I had a face-to-face encounter that meets the physician face-to-face encounter requirements with this patient on this date. The encounter with the patient was in whole or in part for the following MEDICAL CONDITION: (primary reason  for Home Healthcare) MEDICAL NECESSITY: I certify, that based on my findings, NURSING services are a medically necessary home health service. HOME BOUND STATUS: I certify that my clinical findings support that this patient is homebound (i.e., Due to illness or injury, pt requires aid of supportive devices such as crutches, cane, wheelchairs, walkers, the use of special transportation or the assistance of another person to leave their place of residence. There is a normal inability to leave the home and doing so requires considerable and taxing effort. Other absences are for medical reasons / religious services  and are infrequent or of short duration when for other reasons). If current dressing causes regression in wound condition, may D/C ordered dressing product/s and apply Normal Saline Moist Dressing daily until next Wound Healing Center / Other MD appointment. Notify Wound Healing Center of regression in wound condition at [phone]. Please direct any NON-WOUND related issues/requests for orders to patient's Primary Care Physician Wound #6 Left,Lateral Toe Great: Continue Home Health Visits Home Health Nurse may visit PRN to address patient s wound care needs. FACE TO FACE ENCOUNTER: MEDICARE and MEDICAID PATIENTS: I certify that this patient is under my care and that I had a face-to-face encounter that meets the physician face-to-face encounter requirements with this patient on this date. The encounter with the patient was in whole or in part for the following MEDICAL CONDITION: (primary reason for Home Healthcare) MEDICAL NECESSITY: I certify, that based on my findings, NURSING services are a medically necessary home health service. HOME BOUND STATUS: I certify that my clinical findings support that this patient is homebound (i.e., Due to illness or injury, pt requires aid of supportive devices such as crutches, cane, wheelchairs, walkers, the use of special transportation or the assistance of another person to leave their place of residence. There is a normal inability to leave the home and doing so requires considerable and taxing effort. Other absences are for medical reasons / religious services and are infrequent or of short duration when for other reasons). If current dressing causes regression in wound condition, may D/C ordered dressing product/s and apply Normal Saline Moist Dressing daily until next Wound Healing Center / Other MD appointment. Notify Wound Healing Center of regression in wound condition at [phone]. Please direct any NON-WOUND related issues/requests for orders  to patient's Primary Care Physician Wound #7 Left Toe Third: Continue Home Health Visits Home Health Nurse may visit PRN to address patient s wound care needs. FACE TO FACE ENCOUNTER: MEDICARE and MEDICAID PATIENTS: I certify that this patient is under my care and that I [name], [name] ([id number]) had a face-to-face encounter that meets the physician face-to-face encounter requirements with this patient on this date. The encounter with the patient was in whole or in part for the following MEDICAL CONDITION: (primary reason for Home Healthcare) MEDICAL NECESSITY: I certify, that based on my findings, NURSING services are a medically necessary home health service. HOME BOUND STATUS: I certify that my clinical findings support that this patient is homebound (i.e., Due to illness or injury, pt requires aid of supportive devices such as crutches, cane, wheelchairs, walkers, the use of special transportation or the assistance of another person to leave their place of residence. There is a normal inability to leave the home and doing so requires considerable and taxing effort. Other absences are for medical reasons / religious services and are infrequent or of short duration when for other reasons). If current dressing causes regression in wound condition, may D/C  ordered dressing product/s and apply Normal Saline Moist Dressing daily until next Wound Healing Center / Other MD appointment. Notify Wound Healing Center of regression in wound condition at [phone]. Please direct any NON-WOUND related issues/requests for orders to patient's Primary Care Physician The following medication(s) was prescribed: lidocaine topical 4 % cream 1 1 cream topical was prescribed at facility #1 we have been using silver alginate to all wound areas on her bilateral toes and that should continue #2 of spoken to Dr. Allyson Sabal today who is kindly agreed to do one last attempt to see if anything can be done on the left  foot. He will see her on Friday and scheduled arteriogram for early next week. #3 also spoken to her primary doctor in [facility] Dr. Cathie Beams about the degree of pain she is in an updated him about the vascular issues #4 she follows with cardiology for congestive heart failure. Today she didn't seem to have any of that. Electronic Signature(s) Signed: 07/27/2017 4:49:25 PM By: Baltazar Najjar MD Entered By: Baltazar Najjar on 07/27/2017 14:08:22 [name], [name] ([id number]) -------------------------------------------------------------------------------- SuperBill Details Patient Name: [name] Date of Service: 07/27/2017 Medical Record Number: [id number] Patient Account Number: [id number] Date of Birth/Sex: 1946/02/26 (72 y.o. 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 Facility Procedures CPT4 Code: 86578469 Description: 62952 - WOUND CARE VISIT-LEV 5 EST PT Modifier: Quantity: 1 Physician Procedures CPT4 Code Description: [id number] - WC PHYS LEVEL 4 - EST PT ICD-10 Diagnosis Description I70.245 Atherosclerosis of native arteries of left leg with ulceration of I70.235 Atherosclerosis 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

## 2017-07-31 NOTE — Progress Notes (Signed)
**Note [name]-Identified via Obfuscation** MEKALA, WINGER ([id number]) Visit Report for 07/27/2017 Arrival Information Details Patient Name: [name], [name] Date of Service: 07/27/2017 12:30 PM Medical Record Number: [id number] Patient Account Number: [id number] Date of Birth/Sex: 11-Jul-1945 (72 y.o. Female) Treating RN: [name], [name] Primary Care [name]: [name] Other Clinician: Referring [name]: [name] Treating Taylen Osorto/Extender: [name] in Treatment: 6 Visit Information History Since Last Visit All ordered tests and consults were completed: No Patient Arrived: Wheel Chair Added or deleted any medications: No Arrival Time: 12:43 Any new allergies or adverse reactions: No Accompanied By: husband Had a fall or experienced change in No Transfer Assistance: None activities of daily living that may affect Patient Identification Verified: Yes risk of falls: Secondary Verification Process Yes Signs or symptoms of abuse/neglect since last visito No Completed: Hospitalized since last visit: No Patient Requires Transmission-Based No Has Dressing in Place as Prescribed: Yes Precautions: Pain Present Now: Yes Patient Has Alerts: Yes Patient Alerts: Patient on Blood Thinner Prediabetic Eliquis Electronic Signature(s) Signed: 07/29/2017 4:17:47 PM By: [name] Entered By: [name] on 07/27/2017 12:44:56 [name], [name] ([id number]) -------------------------------------------------------------------------------- Clinic Level of Care Assessment Details Patient Name: [name] Date of Service: 07/27/2017 12:30 PM Medical Record Number: [id number] Patient Account Number: [id number] Date of Birth/Sex: 1945-10-20 (71 y.o. Female) Treating RN: [name], [name] Primary Care [name]: [name] Other Clinician: Referring [name]: [name] Treating Zenya Hickam/Extender: [name] in Treatment: 6 Clinic Level of Care Assessment Items TOOL 4 Quantity  Score X - Use when only an EandM is performed on FOLLOW-UP visit 1 0 ASSESSMENTS - Nursing Assessment / Reassessment X - Reassessment of Co-morbidities (includes updates in patient status) 1 10 X- 1 5 Reassessment of Adherence to Treatment Plan ASSESSMENTS - Wound and Skin Assessment / Reassessment []  - Simple Wound Assessment / Reassessment - one wound 0 X- 6 5 Complex Wound Assessment / Reassessment - multiple wounds []  - 0 Dermatologic / Skin Assessment (not related to wound area) ASSESSMENTS - Focused Assessment []  - Circumferential Edema Measurements - multi extremities 0 []  - 0 Nutritional Assessment / Counseling / Intervention []  - 0 Lower Extremity Assessment (monofilament, tuning fork, pulses) []  - 0 Peripheral Arterial Disease Assessment (using hand held doppler) ASSESSMENTS - Ostomy and/or Continence Assessment and Care []  - Incontinence Assessment and Management 0 []  - 0 Ostomy Care Assessment and Management (repouching, etc.) PROCESS - Coordination of Care []  - Simple Patient / Family Education for ongoing care 0 X- 1 20 Complex (extensive) Patient / Family Education for ongoing care X- 1 10 Staff obtains Chiropractor, Records, Test Results / Process Orders X- 1 10 Staff telephones HHA, Nursing Homes / Clarify orders / etc []  - 0 Routine Transfer to another Facility (non-emergent condition) []  - 0 Routine Hospital Admission (non-emergent condition) []  - 0 New Admissions / Manufacturing engineer / Ordering NPWT, Apligraf, etc. []  - 0 Emergency Hospital Admission (emergent condition) X- 1 10 Simple Discharge Coordination [name], [name] ([id number]) []  - 0 Complex (extensive) Discharge Coordination PROCESS - Special Needs []  - Pediatric / Minor Patient Management 0 []  - 0 Isolation Patient Management []  - 0 Hearing / Language / Visual special needs []  - 0 Assessment of Community assistance (transportation, D/C planning, etc.) []  - 0 Additional assistance  / Altered mentation []  - 0 Support Surface(s) Assessment (bed, cushion, seat, etc.) INTERVENTIONS - Wound Cleansing / Measurement []  - Simple Wound Cleansing - one wound 0 X- 6 5 Complex Wound Cleansing - multiple wounds  X- 1 5 Wound Imaging (photographs - any number of wounds) []  - 0 Wound Tracing (instead of photographs) []  - 0 Simple Wound Measurement - one wound X- 6 5 Complex Wound Measurement - multiple wounds INTERVENTIONS - Wound Dressings []  - Small Wound Dressing one or multiple wounds 0 X- 1 15 Medium Wound Dressing one or multiple wounds []  - 0 Large Wound Dressing one or multiple wounds X- 1 5 Application of Medications - topical []  - 0 Application of Medications - injection INTERVENTIONS - Miscellaneous []  - External ear exam 0 []  - 0 Specimen Collection (cultures, biopsies, blood, body fluids, etc.) []  - 0 Specimen(s) / Culture(s) sent or taken to Lab for analysis []  - 0 Patient Transfer (multiple staff / Nurse, adultHoyer Lift / Similar devices) []  - 0 Simple Staple / Suture removal (25 or less) []  - 0 Complex Staple / Suture removal (26 or more) []  - 0 Hypo / Hyperglycemic Management (close monitor of Blood Glucose) []  - 0 Ankle / Brachial Index (ABI) - do not check if billed separately X- 1 5 Vital Signs [name], [name] ([id number]) Has the patient been seen at the hospital within the last three years: Yes Total Score: 185 Level Of Care: New/Established - Level 5 Electronic Signature(s) Signed: 07/29/2017 4:17:47 PM By: [name] MullingPinkerton, Debra Entered By: [name] MullingPinkerton, Debra on 07/27/2017 16:19:03 Philson, [name] ([id number]) -------------------------------------------------------------------------------- Encounter Discharge Information Details Patient Name: [name] LoaEFALCO, [name] Date of Service: 07/27/2017 12:30 PM Medical Record Number: [id number] Patient Account Number: [id number] Date of Birth/Sex: 10-15-1945 (71 y.o. Female) Treating RN: [name] CordiaPinkerton, [name] Primary Care  [name] Other Clinician: Referring Alverda Nazzaro: [name] AltoBURDINE, [name] Treating Nesa Distel/Extender: [name] CarolinaOBSON, MICHAEL G Weeks in Treatment: 6 Encounter Discharge Information Items Discharge Pain Level: 0 Discharge Condition: Stable Ambulatory Status: Wheelchair Discharge Destination: Home Transportation: Private Auto Accompanied By: husband Schedule Follow-up Appointment: Yes Medication Reconciliation completed and No provided to Patient/Care Nguyet Mercer: Provided on Clinical Summary of Care: 07/27/2017 Form Type Recipient Paper Patient MD Electronic Signature(s) Signed: 07/27/2017 2:06:30 PM By: [name] MullingPinkerton, Debra Entered By: [name] MullingPinkerton, Debra on 07/27/2017 14:06:30 Grigoryan, [name] ([id number]) -------------------------------------------------------------------------------- Lower Extremity Assessment Details Patient Name: [name] LoaEFALCO, [name] Date of Service: 07/27/2017 12:30 PM Medical Record Number: [id number] Patient Account Number: [id number] Date of Birth/Sex: 10-15-1945 (71 y.o. Female) Treating RN: [name] CordiaPinkerton, [name] Primary Care Brayam Boeke: [name] AltoBURDINE, [name] Other Clinician: Referring Karlita Lichtman: [name] AltoBURDINE, [name] Treating Anniemae Haberkorn/Extender: [name] CarolinaOBSON, MICHAEL G Weeks in Treatment: 6 Vascular Assessment Pulses: Dorsalis Pedis Palpable: [Left:No] [Right:No] Doppler Audible: [Left:Yes] [Right:Yes] Posterior Tibial Extremity colors, hair growth, and conditions: Extremity Color: [Left:Normal] [Right:Normal] Temperature of Extremity: [Left:Warm] [Right:Warm] Capillary Refill: [Left:> 3 seconds] [Right:> 3 seconds] Toe Nail Assessment Left: Right: Thick: Yes Yes Discolored: Yes Yes Deformed: Yes Yes Improper Length and Hygiene: Yes Yes Electronic Signature(s) Signed: 07/29/2017 4:17:47 PM By: [name] MullingPinkerton, Debra Entered By: [name] MullingPinkerton, Debra on 07/27/2017 12:56:58 Grajeda, [name] ([id number]) -------------------------------------------------------------------------------- Multi Wound  Chart Details Patient Name: [name] LoaEFALCO, Mazella G. Date of Service: 07/27/2017 12:30 PM Medical Record Number: [id number] Patient Account Number: [id number] Date of Birth/Sex: 10-15-1945 (71 y.o. Female) Treating RN: [name] CordiaPinkerton, [name] Primary Care Demarious Kapur: [name] AltoBURDINE, [name] Other Clinician: Referring Naeemah Jasmer: [name] AltoBURDINE, [name] Treating Demarious Kapur/Extender: [name] CarolinaOBSON, MICHAEL G Weeks in Treatment: 6 Vital Signs Height(in): 63 Pulse(bpm): 88 Weight(lbs): 176 Blood Pressure(mmHg): 103/57 Body Mass Index(BMI): 31 Temperature(F): 98.3 Respiratory Rate 18 (breaths/min): Photos: [1:No Photos] [2:No Photos] [4:No Photos] Wound Location: [1:Right Toe - Web between 2nd and 3rd - Distal] [2:Right Toe - Web between 3rd and 4th] [4:Right Toe Fifth -  Dorsal] Wounding Event: [1:Gradually Appeared] [2:Pressure Injury] [4:Gradually Appeared] Primary Etiology: [1:Arterial Insufficiency Ulcer] [2:Arterial Insufficiency Ulcer] [4:Arterial Insufficiency Ulcer] Comorbid History: [1:Anemia, Arrhythmia, Congestive Heart Failure, Hypertension, Myocardial Infarction, Peripheral Arterial Disease, Neuropathy] [2:Anemia, Arrhythmia, Congestive Heart Failure, Hypertension, Myocardial Infarction, Peripheral Arterial  Disease, Neuropathy] [4:Anemia, Arrhythmia, Congestive Heart Failure, Hypertension, Myocardial Infarction, Peripheral Arterial Disease, Neuropathy] Date Acquired: [1:05/30/2017] [2:02/21/2017] [4:07/19/2017] Weeks of Treatment: [1:6] [2:5] [4:1] Wound Status: [1:Open] [2:Open] [4:Open] Clustered Wound: [1:Yes] [2:No] [4:No] Pending Amputation on [1:Yes] [2:No] [4:No] Presentation: Measurements L x W x D [1:0.8x0.6x0.1] [2:0.5x1x0.1] [4:0.3x0.7x0.1] (cm) Area (cm) : [1:0.377] [2:0.393] [4:0.165] Volume (cm) : [1:0.038] [2:0.039] [4:0.016] % Reduction in Area: [1:98.50%] [2:73.90%] [4:-432.30%] % Reduction in Volume: [1:98.50%] [2:74.20%] [4:-433.30%] Classification: [1:Full Thickness Without Exposed Support  Structures] [2:Partial Thickness] [4:Partial Thickness] Exudate Amount: [1:Large] [2:Large] [4:Small] Exudate Type: [1:Serous] [2:Serous] [4:Serous] Exudate Color: [1:amber] [2:amber] [4:amber] Foul Odor After Cleansing: [1:Yes] [2:Yes] [4:No] Odor Anticipated Due to [1:No] [2:No] [4:N/A] Product Use: Wound Margin: [1:Distinct, outline attached] [2:Distinct, outline attached] [4:Flat and Intact] Granulation Amount: [1:None Present (0%)] [2:None Present (0%)] [4:None Present (0%)] Necrotic Amount: [1:Large (67-100%)] [2:Large (67-100%)] [4:Large (67-100%)] Necrotic Tissue: [1:Adherent Slough] [2:Eschar, Adherent Slough] [4:Eschar] Epithelialization: [1:N/A] [2:None] [4:None] Periwound Skin Texture: [1:No Abnormalities Noted] [2:No Abnormalities Noted] [4:Excoriation: No Induration: No] Callus: No Crepitus: No Rash: No Scarring: No Periwound Skin Moisture: Maceration: Yes Maceration: Yes Maceration: No Dry/Scaly: No Periwound Skin Color: No Abnormalities Noted No Abnormalities Noted Atrophie Blanche: No Cyanosis: No Ecchymosis: No Erythema: No Hemosiderin Staining: No Mottled: No Pallor: No Rubor: No Temperature: No Abnormality No Abnormality No Abnormality Tenderness on Palpation: Yes Yes Yes Wound Preparation: Ulcer Cleansing: Ulcer Cleansing: Ulcer Cleansing: Rinsed/Irrigated with Saline Rinsed/Irrigated with Saline Rinsed/Irrigated with Saline Topical Anesthetic Applied: Topical Anesthetic Applied: Topical Anesthetic Applied: Other: lidocaine 4% Other: lidocaine 4% Other: lidocaine 4% Wound Number: [id number]  Photos: No Photos No Photos No Photos Wound Location: Left Toe Great - Distal Left Toe Great - Lateral Left Toe Third Wounding Event: Gradually Appeared Gradually Appeared Gradually Appeared 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 Comorbid History: Anemia, Arrhythmia, Anemia, Arrhythmia, Anemia,  Arrhythmia, Congestive Heart Failure, Congestive Heart Failure, Congestive Heart Failure, Hypertension, Myocardial Hypertension, Myocardial Hypertension, Myocardial Infarction, Peripheral Arterial Infarction, Peripheral Arterial Infarction, Peripheral Arterial Disease, Neuropathy Disease, Neuropathy Disease, Neuropathy Date Acquired: 07/17/2017 07/17/2017 07/16/2017 Weeks of Treatment: 1 1 1  Wound Status: Open Open Open Clustered Wound: No No No Pending Amputation on No No No Presentation: Measurements L x W x D 2.5x1x0.1 1.2x1.6x0.1 1.5x1.9x0.1 (cm) Area (cm) : 1.963 1.508 2.238 Volume (cm) : 0.196 0.151 0.224 % Reduction in Area: -25.00% 51.50% -196.80% % Reduction in Volume: -1125.00% 51.40% -198.70% Classification: Partial Thickness Partial Thickness Partial Thickness Exudate Amount: Large Large Large Exudate Type: Serous Serosanguineous Purulent Exudate Color: amber red, brown yellow, brown, green Foul Odor After Cleansing: Yes Yes Yes Odor Anticipated Due to No No No Product Use: Wound Margin: Flat and Intact Distinct, outline attached Distinct, outline attached Granulation Amount: None Present (0%) None Present (0%) None Present (0%) Necrotic Amount: Large (67-100%) Large (67-100%) Large (67-100%) Necrotic Tissue: Eschar Eschar, Adherent Slough Eschar Exposed Structures: Fascia: No Fascia: No Fascia: No Fat Layer (Subcutaneous Fat Layer (Subcutaneous Fat Layer (Subcutaneous [name], [name] ([id number]) Tissue) Exposed: No Tissue) Exposed: No Tissue) Exposed: No Tendon: No Tendon: No Tendon: No Muscle: No Muscle: No Muscle: No Joint: No Joint: No Joint: No Bone: No Bone: No Bone: No Epithelialization: None None None  Periwound Skin Texture: Induration: Yes Induration: Yes Callus: Yes Callus: Yes Callus: Yes Excoriation: No Scarring: Yes Crepitus: Yes Induration: No Excoriation: No Excoriation: No Crepitus: No Crepitus: No Rash: No Rash: No Rash:  No Scarring: No Scarring: No Periwound Skin Moisture: Maceration: No Maceration: Yes Maceration: No Dry/Scaly: No Dry/Scaly: No Dry/Scaly: No Periwound Skin Color: Atrophie Blanche: No Atrophie Blanche: No Atrophie Blanche: No Cyanosis: No Cyanosis: No Cyanosis: No Ecchymosis: No Ecchymosis: No Ecchymosis: No Erythema: No Erythema: No Erythema: No Hemosiderin Staining: No Hemosiderin Staining: No Hemosiderin Staining: No Mottled: No Mottled: No Mottled: No Pallor: No Pallor: No Pallor: No Rubor: No Rubor: No Rubor: No Temperature: No Abnormality No Abnormality No Abnormality Tenderness on Palpation: Yes Yes Yes Wound Preparation: Ulcer Cleansing: Ulcer Cleansing: Ulcer Cleansing: Rinsed/Irrigated with Saline Rinsed/Irrigated with Saline Rinsed/Irrigated with Saline Topical Anesthetic Applied: Topical Anesthetic Applied: Topical Anesthetic Applied: Other: lidocaine 4% Other: lidocaine 4% Other: lidocaine 4% Treatment Notes Electronic Signature(s) Signed: 07/27/2017 4:49:25 PM By: Baltazar Najjar MD Entered By: Baltazar Najjar on 07/27/2017 14:01:15 Blakesley, [name] ([id number]) -------------------------------------------------------------------------------- Multi-Disciplinary Care Plan Details Patient Name: [name] Date of Service: 07/27/2017 12:30 PM Medical Record Number: [id number] Patient Account Number: [id number] Date of Birth/Sex: [birth year] (71 y.o. Female) Treating RN: [name], [name] Primary Care Tatyana Biber: [name] Other Clinician: Referring Britany Callicott: [name] Treating Rodney Yera/Extender: [name] [place] in Treatment: 6 Active Inactive ` Abuse / Safety / Falls / Self Care Management Nursing Diagnoses: History of Falls Impaired home maintenance Potential for falls Goals: Patient will not experience any injury related to falls Date Initiated: 06/13/2017 Target Resolution Date: 07/14/2017 Goal Status:  Active Patient will remain injury free related to falls Date Initiated: 06/13/2017 Target Resolution Date: 07/14/2017 Goal Status: Active Interventions: Assess fall risk on admission and as needed Treatment Activities: Patient referred to home care : 06/13/2017 Notes: ` Necrotic Tissue Nursing Diagnoses: Impaired tissue integrity related to necrotic/devitalized tissue Goals: Necrotic/devitalized tissue will be minimized in the wound bed Date Initiated: 06/13/2017 Target Resolution Date: 07/14/2017 Goal Status: Active Interventions: Assess patient pain level pre-, during and post procedure and prior to discharge Provide education on necrotic tissue and debridement process Treatment Activities: Apply topical anesthetic as ordered : 06/13/2017 Notes: Nguyen, [name] ([id number]) ` Orientation to the Wound Care Program Nursing Diagnoses: Knowledge deficit related to the wound healing center program Goals: Patient/caregiver will verbalize understanding of the Wound Healing Center Program Date Initiated: 06/13/2017 Target Resolution Date: 07/14/2017 Goal Status: Active Interventions: Provide education on orientation to the wound center Notes: ` Wound/Skin Impairment Nursing Diagnoses: Impaired tissue integrity Knowledge deficit related to ulceration/compromised skin integrity Goals: Ulcer/skin breakdown will have a volume reduction of 30% by week 4 Date Initiated: 06/13/2017 Target Resolution Date: 07/14/2017 Goal Status: Active Interventions: Assess ulceration(s) every visit Treatment Activities: Patient referred to home care : 06/13/2017 Topical wound management initiated : 06/13/2017 Notes: Electronic Signature(s) Signed: 07/29/2017 4:17:47 PM By: [name] Entered By: [name] on 07/27/2017 13:01:01 Radde, [name] ([id number]) -------------------------------------------------------------------------------- Pain Assessment Details Patient Name:  [name] Date of Service: 07/27/2017 12:30 PM Medical Record Number: [id number] Patient Account Number: [id number] Date of Birth/Sex: 11-09-1945 (71 y.o. Female) Treating RN: [name], [name] Primary Care Khalen Styer: [name] Other Clinician: Referring Xandria Gallaga: [name] Treating Teran Knittle/Extender: [name] [place] in Treatment: 6 Active Problems Location of Pain Severity and Description of Pain Patient Has Paino Yes Site Locations Pain Location: Pain in Ulcers Rate the pain. Current Pain Level: 2 Character of  Pain Describe the Pain: Aching Pain Management and Medication Current Pain Management: Electronic Signature(s) Signed: 07/29/2017 4:17:47 PM By: [name] Entered By: [name] on 07/27/2017 12:45:15 [name], [name] ([id number]) -------------------------------------------------------------------------------- Patient/Caregiver Education Details Patient Name: [name] Date of Service: 07/27/2017 12:30 PM Medical Record Number: [id number] Patient Account Number: [id number] Date of Birth/Gender: 12/14/1945 (71 y.o. Female) Treating RN: [name], [name] Primary Care Physician: [name] Other Clinician: Referring Physician: Quintin [name] Treating Physician/Extender: [name] Kraemer in Treatment: 6 Education Assessment Education Provided To: Patient Education Topics Provided Wound/Skin Impairment: Handouts: Caring for Your Ulcer, Other: change dressing as ordered Methods: Demonstration, Explain/Verbal Responses: State content correctly Electronic Signature(s) Signed: 07/29/2017 4:17:47 PM By: [name] Entered By: [name] on 07/27/2017 13:03:05 Hilton, [name] ([id number]) -------------------------------------------------------------------------------- Wound Assessment Details Patient Name: [name] Date of Service: 07/27/2017 12:30 PM Medical Record Number: [id number] Patient Account  Number: [id number] Date of Birth/Sex: 1946-03-27 (72 y.o. Female) Treating RN: [name], [name] Primary Care Neya Creegan: [name] Other Clinician: Referring Merilynn Haydu: [name] Treating Rube Sanchez/Extender: [name] [place] in Treatment: 6 Wound Status Wound Number: 1 Primary Arterial Insufficiency Ulcer Etiology: Wound Location: Right Toe - Web between 2nd and 3rd - Distal Wound Open Status: Wounding Event: Gradually Appeared Comorbid Anemia, Arrhythmia, Congestive Heart Failure, Date Acquired: 05/30/2017 History: Hypertension, Myocardial Infarction, Peripheral Weeks Of Treatment: 6 Arterial Disease, Neuropathy Clustered Wound: Yes Pending Amputation On Presentation Photos Photo Uploaded By: [name] on 07/27/2017 16:40:25 Wound Measurements Length: (cm) 0.8 Width: (cm) 0.6 Depth: (cm) 0.1 Area: (cm) 0.377 Volume: (cm) 0.038 % Reduction in Area: 98.5% % Reduction in Volume: 98.5% Tunneling: No Undermining: No Wound Description Full Thickness Without Exposed Support Classification: Structures Wound Margin: Distinct, outline attached Exudate Large Amount: Exudate Type: Serous Exudate Color: amber Foul Odor After Cleansing: Yes Due to Product Use: No Slough/Fibrino Yes Wound Bed Granulation Amount: None Present (0%) Necrotic Amount: Large (67-100%) Necrotic Quality: Adherent Slough Periwound Skin Texture Texture Color Antwine, Bernedette G. ([id number]) No Abnormalities Noted: No No Abnormalities Noted: No Moisture Temperature / Pain No Abnormalities Noted: No Temperature: No Abnormality Maceration: Yes Tenderness on Palpation: Yes Wound Preparation Ulcer Cleansing: Rinsed/Irrigated with Saline Topical Anesthetic Applied: Other: lidocaine 4%, Treatment Notes Wound #1 (Right, Distal Toe - Web between 2nd and 3rd) 1. Cleansed with: Clean wound with Normal Saline 2. Anesthetic Topical Lidocaine 4% cream to wound bed prior to  debridement 4. Dressing Applied: Other dressing (specify in notes) 5. Secondary Dressing Applied ABD Pad Kerlix/Conform 7. Secured with Tape Notes silvercel Electronic Signature(s) Signed: 07/29/2017 4:17:47 PM By: [name] Entered By: [name] on 07/27/2017 12:52:07 Widen, [name] ([id number]) -------------------------------------------------------------------------------- Wound Assessment Details Patient Name: [name] Date of Service: 07/27/2017 12:30 PM Medical Record Number: [id number] Patient Account Number: [id number] Date of Birth/Sex: 17-May-1946 (72 y.o. Female) Treating RN: [name], [name] Primary Care Jayion Schneck: [name] Other Clinician: Referring Trace Cederberg: [name] Treating Breshay Ilg/Extender: [name] Kath[place] in Treatment: 6 Wound Status Wound Number: 2 Primary Arterial Insufficiency Ulcer Etiology: Wound Location: Right Toe - Web between 3rd and 4th Wound Open Wounding Event: Pressure Injury Status: Date Acquired: 02/21/2017 Comorbid Anemia, Arrhythmia, Congestive Heart Failure, Weeks Of Treatment: 5 History: Hypertension, Myocardial Infarction, Peripheral Clustered Wound: No Arterial Disease, Neuropathy Photos Photo Uploaded By: [name] on 07/27/2017 16:40:25 Wound Measurements Length: (cm) 0.5 Width: (cm) 1 Depth: (cm) 0.1 Area: (cm) 0.393 Volume: (cm) 0.039 % Reduction in Area: 73.9% % Reduction in Volume: 74.2% Epithelialization:  None Tunneling: No Undermining: No Wound Description Classification: Partial Thickness Wound Margin: Distinct, outline attached Exudate Amount: Large Exudate Type: Serous Exudate Color: amber Foul Odor After Cleansing: Yes Due to Product Use: No Slough/Fibrino Yes Wound Bed Granulation Amount: None Present (0%) Necrotic Amount: Large (67-100%) Necrotic Quality: Eschar, Adherent Slough Periwound Skin Texture Texture Color No Abnormalities Noted: No No  Abnormalities Noted: No Moisture Temperature / Pain No Abnormalities Noted: No Temperature: No Abnormality Hanlon, Earnstine G. ([id number]) Maceration: Yes Tenderness on Palpation: Yes Wound Preparation Ulcer Cleansing: Rinsed/Irrigated with Saline Topical Anesthetic Applied: Other: lidocaine 4%, Treatment Notes Wound #2 (Right Toe - Web between 3rd and 4th) 1. Cleansed with: Clean wound with Normal Saline 2. Anesthetic Topical Lidocaine 4% cream to wound bed prior to debridement 4. Dressing Applied: Other dressing (specify in notes) 5. Secondary Dressing Applied ABD Pad Kerlix/Conform 7. Secured with Tape Notes silvercel Electronic Signature(s) Signed: 07/29/2017 4:17:47 PM By: [name] Entered By: [name] on 07/27/2017 12:52:33 Hsiao, [name] ([id number]) -------------------------------------------------------------------------------- Wound Assessment Details Patient Name: [name] Date of Service: 07/27/2017 12:30 PM Medical Record Number: [id number] Patient Account Number: [id number] Date of Birth/Sex: Dec 31, 1945 (72 y.o. Female) Treating RN: [name], [name] Primary Care Ayiana Winslett: [name] Other Clinician: Referring Elvia Aydin: [name] Treating Coren Sagan/Extender: [name] [place] in Treatment: 6 Wound Status Wound Number: 4 Primary Arterial Insufficiency Ulcer Etiology: Wound Location: Right Toe Fifth - Dorsal Wound Open Wounding Event: Gradually Appeared Status: Date Acquired: 07/19/2017 Comorbid Anemia, Arrhythmia, Congestive Heart Failure, Weeks Of Treatment: 1 History: Hypertension, Myocardial Infarction, Peripheral Clustered Wound: No Arterial Disease, Neuropathy Photos Photo Uploaded By: [name] on 07/27/2017 16:41:02 Wound Measurements Length: (cm) 0.3 Width: (cm) 0.7 Depth: (cm) 0.1 Area: (cm) 0.165 Volume: (cm) 0.016 % Reduction in Area: -432.3% % Reduction in Volume:  -433.3% Epithelialization: None Tunneling: No Undermining: No Wound Description Classification: Partial Thickness Wound Margin: Flat and Intact Exudate Amount: Small Exudate Type: Serous Exudate Color: amber Foul Odor After Cleansing: No Slough/Fibrino No Wound Bed Granulation Amount: None Present (0%) Exposed Structure Necrotic Amount: Large (67-100%) Fascia Exposed: No Necrotic Quality: Eschar Fat Layer (Subcutaneous Tissue) Exposed: No Tendon Exposed: No Muscle Exposed: No Joint Exposed: No Bone Exposed: No Periwound Skin Texture [name], [name] ([id number]) Texture Color No Abnormalities Noted: No No Abnormalities Noted: No Callus: No Atrophie Blanche: No Crepitus: No Cyanosis: No Excoriation: No Ecchymosis: No Induration: No Erythema: No Rash: No Hemosiderin Staining: No Scarring: No Mottled: No Pallor: No Moisture Rubor: No No Abnormalities Noted: No Dry / Scaly: No Temperature / Pain Maceration: No Temperature: No Abnormality Tenderness on Palpation: Yes Wound Preparation Ulcer Cleansing: Rinsed/Irrigated with Saline Topical Anesthetic Applied: Other: lidocaine 4%, Treatment Notes Wound #4 (Right, Dorsal Toe Fifth) 1. Cleansed with: Clean wound with Normal Saline 2. Anesthetic Topical Lidocaine 4% cream to wound bed prior to debridement 4. Dressing Applied: Other dressing (specify in notes) 5. Secondary Dressing Applied ABD Pad Kerlix/Conform 7. Secured with Tape Notes silvercel Electronic Signature(s) Signed: 07/29/2017 4:17:47 PM By: [name] Entered By: [name] on 07/27/2017 12:53:22 Cumbee, [name] ([id number]) -------------------------------------------------------------------------------- Wound Assessment Details Patient Name: [name] Date of Service: 07/27/2017 12:30 PM Medical Record Number: [id number] Patient Account Number: [id number] Date of Birth/Sex: [birth year] (72 y.o. Female) Treating RN:  [name], [name] Primary Care Eartha Vonbehren: [name] Other Clinician: Referring Kimmora Risenhoover: [name] Treating Erron Wengert/Extender: Maxwell Caul Weeks in Treatment: 6 Wound Status Wound Number: 5 Primary Arterial Insufficiency Ulcer Etiology: Wound Location: Left Toe  Great - Distal Wound Open Wounding Event: Gradually Appeared Status: Date Acquired: 07/17/2017 Comorbid Anemia, Arrhythmia, Congestive Heart Failure, Weeks Of Treatment: 1 History: Hypertension, Myocardial Infarction, Peripheral Clustered Wound: No Arterial Disease, Neuropathy Photos Photo Uploaded By: [name] on 07/27/2017 16:41:02 Wound Measurements Length: (cm) 2.5 Width: (cm) 1 Depth: (cm) 0.1 Area: (cm) 1.963 Volume: (cm) 0.196 % Reduction in Area: -25% % Reduction in Volume: -1125% Epithelialization: None Tunneling: No Undermining: No Wound Description Classification: Partial Thickness Wound Margin: Flat and Intact Exudate Amount: Large Exudate Type: Serous Exudate Color: amber Foul Odor After Cleansing: Yes Due to Product Use: No Slough/Fibrino Yes Wound Bed Granulation Amount: None Present (0%) Exposed Structure Necrotic Amount: Large (67-100%) Fascia Exposed: No Necrotic Quality: Eschar Fat Layer (Subcutaneous Tissue) Exposed: No Tendon Exposed: No Muscle Exposed: No Joint Exposed: No Bone Exposed: No Periwound Skin Texture [name], [name] ([id number]) Texture Color No Abnormalities Noted: No No Abnormalities Noted: No Callus: Yes Atrophie Blanche: No Crepitus: No Cyanosis: No Excoriation: No Ecchymosis: No Induration: Yes Erythema: No Rash: No Hemosiderin Staining: No Scarring: Yes Mottled: No Pallor: No Moisture Rubor: No No Abnormalities Noted: No Dry / Scaly: No Temperature / Pain Maceration: No Temperature: No Abnormality Tenderness on Palpation: Yes Wound Preparation Ulcer Cleansing: Rinsed/Irrigated with Saline Topical Anesthetic  Applied: Other: lidocaine 4%, Treatment Notes Wound #5 (Left, Distal Toe Great) 1. Cleansed with: Clean wound with Normal Saline 2. Anesthetic Topical Lidocaine 4% cream to wound bed prior to debridement 4. Dressing Applied: Other dressing (specify in notes) 5. Secondary Dressing Applied ABD Pad Kerlix/Conform 7. Secured with Tape Notes silvercel Electronic Signature(s) Signed: 07/29/2017 4:17:47 PM By: [name] Entered By: [name] on 07/27/2017 12:54:12 Hopple, [name] ([id number]) -------------------------------------------------------------------------------- Wound Assessment Details Patient Name: [name] Date of Service: 07/27/2017 12:30 PM Medical Record Number: [id number] Patient Account Number: [id number] Date of Birth/Sex: 1946/06/07 (72 y.o. Female) Treating RN: [name], [name] Primary Care Jennalynn Rivard: [name] Other Clinician: Referring Kadee Philyaw: [name] Treating Marice Angelino/Extender: [name]  in Treatment: 6 Wound Status Wound Number: 6 Primary Arterial Insufficiency Ulcer Etiology: Wound Location: Left Toe Great - Lateral Wound Open Wounding Event: Gradually Appeared Status: Date Acquired: 07/17/2017 Comorbid Anemia, Arrhythmia, Congestive Heart Failure, Weeks Of Treatment: 1 History: Hypertension, Myocardial Infarction, Peripheral Clustered Wound: No Arterial Disease, Neuropathy Photos Photo Uploaded By: [name] on 07/27/2017 16:41:43 Wound Measurements Length: (cm) 1.2 Width: (cm) 1.6 Depth: (cm) 0.1 Area: (cm) 1.508 Volume: (cm) 0.151 % Reduction in Area: 51.5% % Reduction in Volume: 51.4% Epithelialization: None Tunneling: No Wound Description Classification: Partial Thickness Wound Margin: Distinct, outline attached Exudate Amount: Large Exudate Type: Serosanguineous Exudate Color: red, brown Foul Odor After Cleansing: Yes Due to Product Use: No Slough/Fibrino Yes Wound  Bed Granulation Amount: None Present (0%) Exposed Structure Necrotic Amount: Large (67-100%) Fascia Exposed: No Necrotic Quality: Eschar, Adherent Slough Fat Layer (Subcutaneous Tissue) Exposed: No Tendon Exposed: No Muscle Exposed: No Joint Exposed: No Bone Exposed: No Periwound Skin Texture Ausborn, Dezyre G. ([id number]) Texture Color No Abnormalities Noted: No No Abnormalities Noted: No Callus: Yes Atrophie Blanche: No Crepitus: Yes Cyanosis: No Excoriation: No Ecchymosis: No Induration: Yes Erythema: No Rash: No Hemosiderin Staining: No Scarring: No Mottled: No Pallor: No Moisture Rubor: No No Abnormalities Noted: No Dry / Scaly: No Temperature / Pain Maceration: Yes Temperature: No Abnormality Tenderness on Palpation: Yes Wound Preparation Ulcer Cleansing: Rinsed/Irrigated with Saline Topical Anesthetic Applied: Other: lidocaine 4%, Treatment Notes Wound #6 (Left, Lateral Toe Great) 1. Cleansed  with: Clean wound with Normal Saline 2. Anesthetic Topical Lidocaine 4% cream to wound bed prior to debridement 4. Dressing Applied: Other dressing (specify in notes) 5. Secondary Dressing Applied ABD Pad Kerlix/Conform 7. Secured with Tape Notes silvercel Electronic Signature(s) Signed: 07/29/2017 4:17:47 PM By: [name] Entered By: [name] on 07/27/2017 12:55:02 Wellen, [name] ([id number]) -------------------------------------------------------------------------------- Wound Assessment Details Patient Name: [name] Date of Service: 07/27/2017 12:30 PM Medical Record Number: [id number] Patient Account Number: [id number] Date of Birth/Sex: Feb 23, 1946 (72 y.o. Female) Treating RN: [name], [name] Primary Care Jt Brabec: [name] Other Clinician: Referring Veronica Fretz: [name] Treating [place] Kirchner/Extender: [name] [place] in Treatment: 6 Wound Status Wound Number: 7 Primary Arterial Insufficiency  Ulcer Etiology: Wound Location: Left Toe Third Wound Open Wounding Event: Gradually Appeared Status: Date Acquired: 07/16/2017 Comorbid Anemia, Arrhythmia, Congestive Heart Failure, Weeks Of Treatment: 1 History: Hypertension, Myocardial Infarction, Peripheral Clustered Wound: No Arterial Disease, Neuropathy Photos Photo Uploaded By: [name] on 07/27/2017 16:41:43 Wound Measurements Length: (cm) 1.5 Width: (cm) 1.9 Depth: (cm) 0.1 Area: (cm) 2.238 Volume: (cm) 0.224 % Reduction in Area: -196.8% % Reduction in Volume: -198.7% Epithelialization: None Tunneling: No Undermining: No Wound Description Classification: Partial Thickness Wound Margin: Distinct, outline attached Exudate Amount: Large Exudate Type: Purulent Exudate Color: yellow, brown, green Foul Odor After Cleansing: Yes Due to Product Use: No Slough/Fibrino Yes Wound Bed Granulation Amount: None Present (0%) Exposed Structure Necrotic Amount: Large (67-100%) Fascia Exposed: No Necrotic Quality: Eschar Fat Layer (Subcutaneous Tissue) Exposed: No Tendon Exposed: No Muscle Exposed: No Joint Exposed: No Bone Exposed: No Periwound Skin Texture Grime, Aquilla G. ([id number]) Texture Color No Abnormalities Noted: No No Abnormalities Noted: No Callus: Yes Atrophie Blanche: No Crepitus: No Cyanosis: No Excoriation: No Ecchymosis: No Induration: No Erythema: No Rash: No Hemosiderin Staining: No Scarring: No Mottled: No Pallor: No Moisture Rubor: No No Abnormalities Noted: No Dry / Scaly: No Temperature / Pain Maceration: No Temperature: No Abnormality Tenderness on Palpation: Yes Wound Preparation Ulcer Cleansing: Rinsed/Irrigated with Saline Topical Anesthetic Applied: Other: lidocaine 4%, Treatment Notes Wound #7 (Left Toe Third) 1. Cleansed with: Clean wound with Normal Saline 2. Anesthetic Topical Lidocaine 4% cream to wound bed prior to debridement 4. Dressing  Applied: Other dressing (specify in notes) 5. Secondary Dressing Applied ABD Pad Kerlix/Conform 7. Secured with Tape Notes silvercel Electronic Signature(s) Signed: 07/29/2017 4:17:47 PM By: [name] Entered By: [name] on 07/27/2017 12:55:54 Kirkes, [name] ([id number]) -------------------------------------------------------------------------------- Vitals Details Patient Name: [name] Date of Service: 07/27/2017 12:30 PM Medical Record Number: [id number] Patient Account Number: [id number] Date of Birth/Sex: 01/09/1946 (72 y.o. Female) Treating RN: [name], [name] Primary Care Coden Franchi: [name] Other Clinician: Referring Leeroy Lovings: [name] Treating Terra Aveni/Extender: [name] [place] in Treatment: 6 Vital Signs Time Taken: 12:59 Temperature (F): 98.3 Height (in): 63 Pulse (bpm): 88 Weight (lbs): 176 Respiratory Rate (breaths/min): 18 Body Mass Index (BMI): 31.2 Blood Pressure (mmHg): 103/57 Reference Range: 80 - 120 mg / dl Electronic Signature(s) Signed: 07/29/2017 4:17:47 PM By: [name] Entered By: [name] on 07/27/2017 12:59:47

## 2017-08-01 ENCOUNTER — Other Ambulatory Visit: Payer: Self-pay

## 2017-08-01 ENCOUNTER — Encounter (HOSPITAL_COMMUNITY): Payer: Self-pay | Admitting: Cardiovascular Disease

## 2017-08-01 ENCOUNTER — Ambulatory Visit (HOSPITAL_COMMUNITY)
Admission: RE | Admit: 2017-08-01 | Discharge: 2017-08-02 | Disposition: A | Discharge status: Home / Self Care | Payer: Medicare Other | Source: Ambulatory Visit | Attending: Cardiovascular Disease | Admitting: Cardiovascular Disease

## 2017-08-01 ENCOUNTER — Encounter (HOSPITAL_COMMUNITY)
Admission: RE | Disposition: A | Discharge status: Home / Self Care | Payer: Self-pay | Source: Ambulatory Visit | Attending: Cardiovascular Disease

## 2017-08-01 DIAGNOSIS — I7092 Chronic total occlusion of artery of the extremities: Secondary | ICD-10-CM | POA: Insufficient documentation

## 2017-08-01 DIAGNOSIS — L97529 Non-pressure chronic ulcer of other part of left foot with unspecified severity: Secondary | ICD-10-CM | POA: Insufficient documentation

## 2017-08-01 DIAGNOSIS — I70262 Atherosclerosis of native arteries of extremities with gangrene, left leg: Secondary | ICD-10-CM | POA: Diagnosis not present

## 2017-08-01 DIAGNOSIS — Z7901 Long term (current) use of anticoagulants: Secondary | ICD-10-CM | POA: Insufficient documentation

## 2017-08-01 DIAGNOSIS — I482 Chronic atrial fibrillation: Secondary | ICD-10-CM | POA: Insufficient documentation

## 2017-08-01 DIAGNOSIS — Z951 Presence of aortocoronary bypass graft: Secondary | ICD-10-CM | POA: Diagnosis not present

## 2017-08-01 DIAGNOSIS — I255 Ischemic cardiomyopathy: Secondary | ICD-10-CM | POA: Diagnosis not present

## 2017-08-01 DIAGNOSIS — I998 Other disorder of circulatory system: Secondary | ICD-10-CM | POA: Diagnosis present

## 2017-08-01 DIAGNOSIS — I70229 Atherosclerosis of native arteries of extremities with rest pain, unspecified extremity: Secondary | ICD-10-CM

## 2017-08-01 DIAGNOSIS — I739 Peripheral vascular disease, unspecified: Secondary | ICD-10-CM

## 2017-08-01 DIAGNOSIS — I252 Old myocardial infarction: Secondary | ICD-10-CM | POA: Diagnosis not present

## 2017-08-01 DIAGNOSIS — I70245 Atherosclerosis of native arteries of left leg with ulceration of other part of foot: Secondary | ICD-10-CM | POA: Diagnosis not present

## 2017-08-01 HISTORY — PX: PERIPHERAL VASCULAR BALLOON ANGIOPLASTY: CATH118281

## 2017-08-01 HISTORY — PX: LOWER EXTREMITY ANGIOGRAPHY: CATH118251

## 2017-08-01 HISTORY — PX: PERIPHERAL VASCULAR INTERVENTION: CATH118257

## 2017-08-01 LAB — GLUCOSE, CAPILLARY
GLUCOSE-CAPILLARY: 116 mg/dL — AB (ref 65–99)
Glucose-Capillary: 96 mg/dL (ref 65–99)
Glucose-Capillary: 119 mg/dL — ABNORMAL HIGH (ref 65–99)

## 2017-08-01 LAB — POCT ACTIVATED CLOTTING TIME
ACTIVATED CLOTTING TIME: 241 s
ACTIVATED CLOTTING TIME: 164 s
Activated Clotting Time: 224 seconds
Activated Clotting Time: 241 seconds

## 2017-08-01 SURGERY — LOWER EXTREMITY ANGIOGRAPHY
Anesthesia: LOCAL | Laterality: Left

## 2017-08-01 MED ORDER — ASPIRIN 81 MG PO CHEW: 81.0000 mg | CHEWABLE_TABLET | ORAL | Status: DC

## 2017-08-01 MED ORDER — LOSARTAN POTASSIUM 25 MG PO TABS
12.5000 mg | ORAL_TABLET | Freq: Every day | ORAL | Status: DC
  Administered 2017-08-02: 12.5 mg via ORAL
  Filled 2017-08-01: qty 0.5

## 2017-08-01 MED ORDER — FENTANYL CITRATE (PF) 100 MCG/2ML IJ SOLN
25.0000 ug | INTRAMUSCULAR | Status: DC | PRN
  Administered 2017-08-01 – 2017-08-02 (×5): 25 ug via INTRAVENOUS
  Filled 2017-08-01 (×5): qty 2

## 2017-08-01 MED ORDER — LABETALOL HCL 5 MG/ML IV SOLN: 10.0000 mg | INTRAVENOUS | Status: DC | PRN

## 2017-08-01 MED ORDER — ALBUTEROL SULFATE (2.5 MG/3ML) 0.083% IN NEBU: 2.5000 mg | INHALATION_SOLUTION | Freq: Four times a day (QID) | RESPIRATORY_TRACT | Status: DC | PRN

## 2017-08-01 MED ORDER — SODIUM CHLORIDE 0.9% FLUSH
3.0000 mL | Freq: Two times a day (BID) | INTRAVENOUS | Status: DC
  Administered 2017-08-01: 3 mL via INTRAVENOUS

## 2017-08-01 MED ORDER — HYDROCODONE-ACETAMINOPHEN 7.5-325 MG PO TABS
1.0000 | ORAL_TABLET | Freq: Four times a day (QID) | ORAL | Status: DC | PRN
  Administered 2017-08-01: 1 via ORAL
  Filled 2017-08-01: qty 1

## 2017-08-01 MED ORDER — CLOPIDOGREL BISULFATE 75 MG PO TABS
75.0000 mg | ORAL_TABLET | Freq: Every day | ORAL | Status: DC
Start: 2017-08-02 — End: 2017-08-01

## 2017-08-01 MED ORDER — ASPIRIN EC 81 MG PO TBEC: 81.0000 mg | DELAYED_RELEASE_TABLET | Freq: Every day | ORAL | Status: DC

## 2017-08-01 MED ORDER — PANTOPRAZOLE SODIUM 40 MG PO TBEC
40.0000 mg | DELAYED_RELEASE_TABLET | Freq: Every day | ORAL | Status: DC
  Administered 2017-08-02: 40 mg via ORAL
  Filled 2017-08-01 (×2): qty 1

## 2017-08-01 MED ORDER — MAGNESIUM 250 MG PO TABS: 250.0000 mg | ORAL_TABLET | Freq: Every day | ORAL | Status: DC

## 2017-08-01 MED ORDER — LIDOCAINE HCL 1 % IJ SOLN
INTRAMUSCULAR | Status: AC
  Filled 2017-08-01: qty 20

## 2017-08-01 MED ORDER — HYDRALAZINE HCL 20 MG/ML IJ SOLN
5.0000 mg | INTRAMUSCULAR | Status: DC | PRN
  Filled 2017-08-01 (×2): qty 1

## 2017-08-01 MED ORDER — HEPARIN SODIUM (PORCINE) 1000 UNIT/ML IJ SOLN
INTRAMUSCULAR | Status: DC | PRN
  Administered 2017-08-01: 8000 [IU] via INTRAVENOUS
  Administered 2017-08-01: 2000 [IU] via INTRAVENOUS

## 2017-08-01 MED ORDER — HEPARIN (PORCINE) IN NACL 2-0.9 UNIT/ML-% IJ SOLN
INTRAMUSCULAR | Status: AC
  Filled 2017-08-01: qty 1000

## 2017-08-01 MED ORDER — ASPIRIN EC 81 MG PO TBEC
81.0000 mg | DELAYED_RELEASE_TABLET | Freq: Every day | ORAL | Status: DC
  Administered 2017-08-02: 81 mg via ORAL
  Filled 2017-08-01: qty 1

## 2017-08-01 MED ORDER — SODIUM CHLORIDE 0.9 % IV SOLN: 250.0000 mL | INTRAVENOUS | Status: DC | PRN

## 2017-08-01 MED ORDER — NITROGLYCERIN 0.2 MG/HR TD PT24
0.2000 mg | MEDICATED_PATCH | Freq: Every day | TRANSDERMAL | Status: DC
  Administered 2017-08-01 – 2017-08-02 (×2): 0.2 mg via TRANSDERMAL
  Filled 2017-08-01 (×2): qty 1

## 2017-08-01 MED ORDER — SODIUM CHLORIDE 0.9 % WEIGHT BASED INFUSION
3.0000 mL/kg/h | INTRAVENOUS | Status: DC
  Administered 2017-08-01: 3 mL/kg/h via INTRAVENOUS

## 2017-08-01 MED ORDER — FENTANYL CITRATE (PF) 100 MCG/2ML IJ SOLN
INTRAMUSCULAR | Status: DC | PRN
  Administered 2017-08-01 (×2): 25 ug via INTRAVENOUS

## 2017-08-01 MED ORDER — SODIUM CHLORIDE 0.9% FLUSH: 3.0000 mL | INTRAVENOUS | Status: DC | PRN

## 2017-08-01 MED ORDER — TORSEMIDE 20 MG PO TABS
20.0000 mg | ORAL_TABLET | Freq: Once | ORAL | Status: AC
  Administered 2017-08-01: 13:00:00 20 mg via ORAL
  Filled 2017-08-01: qty 1

## 2017-08-01 MED ORDER — AMIODARONE HCL 200 MG PO TABS
200.0000 mg | ORAL_TABLET | Freq: Every day | ORAL | Status: DC
  Administered 2017-08-02: 200 mg via ORAL
  Filled 2017-08-01 (×2): qty 1

## 2017-08-01 MED ORDER — MAGNESIUM OXIDE 400 (241.3 MG) MG PO TABS
400.0000 mg | ORAL_TABLET | Freq: Every day | ORAL | Status: DC
  Administered 2017-08-01 – 2017-08-02 (×2): 400 mg via ORAL
  Filled 2017-08-01 (×2): qty 1

## 2017-08-01 MED ORDER — APIXABAN 5 MG PO TABS: 5.0000 mg | ORAL_TABLET | Freq: Two times a day (BID) | ORAL | Status: DC

## 2017-08-01 MED ORDER — NITROGLYCERIN 1 MG/10 ML FOR IR/CATH LAB
INTRA_ARTERIAL | Status: AC
  Filled 2017-08-01: qty 10

## 2017-08-01 MED ORDER — FENTANYL CITRATE (PF) 100 MCG/2ML IJ SOLN
INTRAMUSCULAR | Status: AC
  Filled 2017-08-01: qty 2

## 2017-08-01 MED ORDER — ACETAMINOPHEN 325 MG PO TABS: 650.0000 mg | ORAL_TABLET | ORAL | Status: DC | PRN

## 2017-08-01 MED ORDER — HYDROCODONE-ACETAMINOPHEN 7.5-325 MG PO TABS
1.0000 | ORAL_TABLET | ORAL | Status: DC | PRN
  Administered 2017-08-01 (×2): 1 via ORAL
  Filled 2017-08-01 (×2): qty 1

## 2017-08-01 MED ORDER — LEVOTHYROXINE SODIUM 75 MCG PO TABS
175.0000 ug | ORAL_TABLET | Freq: Every day | ORAL | Status: DC
  Administered 2017-08-02: 175 ug via ORAL
  Filled 2017-08-01 (×2): qty 1

## 2017-08-01 MED ORDER — POTASSIUM CHLORIDE CRYS ER 20 MEQ PO TBCR
20.0000 meq | EXTENDED_RELEASE_TABLET | Freq: Two times a day (BID) | ORAL | Status: DC
  Administered 2017-08-01 – 2017-08-02 (×3): 20 meq via ORAL
  Filled 2017-08-01 (×3): qty 1

## 2017-08-01 MED ORDER — SODIUM CHLORIDE 0.9 % WEIGHT BASED INFUSION: 1.0000 mL/kg/h | INTRAVENOUS | Status: DC

## 2017-08-01 MED ORDER — IODIXANOL 320 MG/ML IV SOLN
INTRAVENOUS | Status: DC | PRN
  Administered 2017-08-01: 200 mL via INTRA_ARTERIAL

## 2017-08-01 MED ORDER — MIDAZOLAM HCL 2 MG/2ML IJ SOLN
INTRAMUSCULAR | Status: DC | PRN
  Administered 2017-08-01: 1 mg via INTRAVENOUS

## 2017-08-01 MED ORDER — ONDANSETRON HCL 4 MG/2ML IJ SOLN: 4.0000 mg | Freq: Four times a day (QID) | INTRAMUSCULAR | Status: DC | PRN

## 2017-08-01 MED ORDER — LIDOCAINE HCL (PF) 1 % IJ SOLN
INTRAMUSCULAR | Status: DC | PRN
  Administered 2017-08-01: 30 mL

## 2017-08-01 MED ORDER — POTASSIUM CHLORIDE CRYS ER 20 MEQ PO TBCR: 20.0000 meq | EXTENDED_RELEASE_TABLET | Freq: Two times a day (BID) | ORAL | Status: DC

## 2017-08-01 MED ORDER — ACETAMINOPHEN 500 MG PO TABS: 1000.0000 mg | ORAL_TABLET | Freq: Every day | ORAL | Status: DC | PRN

## 2017-08-01 MED ORDER — TRAMADOL HCL 50 MG PO TABS
50.0000 mg | ORAL_TABLET | Freq: Four times a day (QID) | ORAL | Status: DC | PRN
  Administered 2017-08-01 (×2): 50 mg via ORAL
  Filled 2017-08-01 (×2): qty 1

## 2017-08-01 MED ORDER — MIDAZOLAM HCL 2 MG/2ML IJ SOLN
INTRAMUSCULAR | Status: AC
  Filled 2017-08-01: qty 2

## 2017-08-01 MED ORDER — CARVEDILOL 3.125 MG PO TABS
6.2500 mg | ORAL_TABLET | Freq: Two times a day (BID) | ORAL | Status: DC
  Administered 2017-08-01 – 2017-08-02 (×2): 6.25 mg via ORAL
  Filled 2017-08-01 (×3): qty 2

## 2017-08-01 MED ORDER — HEPARIN (PORCINE) IN NACL 2-0.9 UNIT/ML-% IJ SOLN
INTRAMUSCULAR | Status: AC | PRN
  Administered 2017-08-01: 1000 mL

## 2017-08-01 MED ORDER — CLOPIDOGREL BISULFATE 75 MG PO TABS
75.0000 mg | ORAL_TABLET | Freq: Every day | ORAL | Status: DC
  Administered 2017-08-02: 10:00:00 75 mg via ORAL
  Filled 2017-08-01 (×2): qty 1

## 2017-08-01 MED ORDER — SODIUM CHLORIDE 0.9 % IV SOLN
INTRAVENOUS | Status: AC
  Administered 2017-08-01: 10:00:00 via INTRAVENOUS

## 2017-08-01 MED ORDER — NITROGLYCERIN 1 MG/10 ML FOR IR/CATH LAB
INTRA_ARTERIAL | Status: DC | PRN
  Administered 2017-08-01 (×2): 200 ug via INTRA_ARTERIAL

## 2017-08-01 SURGICAL SUPPLY — 26 items
BALLN COYOTE ES OTW 2.5X40X145 (BALLOONS) ×2
BALLN COYOTE ES OTW 2X40X144 (BALLOONS) ×2
BALLOON COYOTE ES OTW 2X40X144 (BALLOONS) ×1 IMPLANT
BALLOON CYTE ES OTW 2.5X40X145 (BALLOONS) ×1 IMPLANT
CATH ANGIO 5F PIGTAIL 65CM (CATHETERS) ×2 IMPLANT
CATH CROSS OVER TEMPO 5F (CATHETERS) ×2 IMPLANT
CATH CXI SUPP ST 2.6FR 150CM (CATHETERS) ×2 IMPLANT
CATH STRAIGHT 5FR 65CM (CATHETERS) ×2 IMPLANT
GLIDEWIRE ANGLED NITR .018X260 (WIRE) ×2 IMPLANT
GUIDEWIRE ANGLED .035X150CM (WIRE) ×2 IMPLANT
KIT ENCORE 26 ADVANTAGE (KITS) ×2 IMPLANT
KIT PV (KITS) ×2 IMPLANT
SHEATH HIGHFLEX ANSEL 6FRX55 (SHEATH) ×2 IMPLANT
SHEATH PINNACLE 5F 10CM (SHEATH) ×2 IMPLANT
SHEATH PINNACLE 6F 10CM (SHEATH) ×2 IMPLANT
SHIELD RADPAD SCOOP 12X17 (MISCELLANEOUS) ×2 IMPLANT
STENT SYNERGY DES 2.5X38 (Permanent Stent) ×4 IMPLANT
STOPCOCK MORSE 400PSI 3WAY (MISCELLANEOUS) ×2 IMPLANT
SYRINGE MEDRAD AVANTA MACH 7 (SYRINGE) ×2 IMPLANT
TAPE VIPERTRACK RADIOPAQ (MISCELLANEOUS) ×1 IMPLANT
TAPE VIPERTRACK RADIOPAQUE (MISCELLANEOUS) ×1
TRANSDUCER W/STOPCOCK (MISCELLANEOUS) ×2 IMPLANT
TRAY PV CATH (CUSTOM PROCEDURE TRAY) ×2 IMPLANT
TUBING CIL FLEX 10 FLL-RA (TUBING) ×2 IMPLANT
WIRE HITORQ VERSACORE ST 145CM (WIRE) ×2 IMPLANT
WIRE SPARTACORE .014X300CM (WIRE) ×2 IMPLANT

## 2017-08-01 NOTE — Discharge Summary (Signed)
Discharge Summary    Patient ID: [name],  MRN: [id number], DOB/AGE: 07/16/1945 72 y.o.  Admit date: 08/01/2017 Discharge date: 08/02/2017  Primary Care Provider: Juliette AlcideBurdine, Steven [name] Primary Cardiologist: [name]  Discharge Diagnoses    Active Problems:   Critical lower limb ischemia   Allergies Allergies  Allergen Reactions  . Oxycodone Other (See Comments)    hallucinations   . Pollen Extract Other (See Comments)    Sneezing, running nose.  . Ranexa [Ranolazine] Nausea Only    Headache and nausea  . Statins Other (See Comments)    Cramping, turns skin yellow    Diagnostic Studies/Procedures    PV angiogram: 08/01/17  Conclusion     Mid L PTA to Dist L PTA lesion is 100% stenosed.  Ost L PTA to Mid L PTA lesion is 100% stenosed.  Dist L ATA lesion is 100% stenosed.  Prox L Peroneal to Mid L Peroneal lesion is 100% stenosed.  A stent was successfully placed.  Post intervention, there is a 0% residual stenosis.   Final Impression: Successful left peroneal CTO PTA and drug-eluting stenting using 2 overlapping 2.5 mm x 38 mm long Synergy drug-eluting stents for critical limb ischemia. She does not have "in-line flow" although she now has a widely patent peroneal supplies collaterals to the distal posterior tibial that feeds the dorsal pedal arch. Hopefully with aggressive wound care she'll be able to heal her left third toe. The Ansel sheath was exchanged over wire for a short 6 French sheath which was then secured. An ending ACT was obtained. The patient was already on dual antiplatelet therapy. She will be hydrated overnight. The sheath will be removed once the ACT falls below 170 and pressure held. Drs. Leanord [name] and Bensimhon  was notified of these results.  [name] BattyJonathan Berry. MD, [facility]ACC _____________   History of Present Illness     [name] is a 72 y.o. female who was referred to [name] by [name] for evaluation and treatment of  critical limb ischemia. She is a retired professor of Academic librariannglish composition. She was seen in the office 06/10/17 which was approximately 2 weeks post right lower extremity intervention. She has a PMH of ischemic cardiomyopathy status post bypass grafting injection of [place] present 14 years ago after a myocardial infarction. She has an EF in the 20-25% range with occluded vein grafts, chronic A. Fib on oral anticoagulation. She had outpatient DC cardioversion by [name] one week ago prior to her office visit and was back in A. Fib on amiodarone and Eliquis.   Since [name] saw her back approximately 6 weeks ago her right foot ischemic ulcers had healed. She had undergone angiography by [name] on 07/14/17 at the request of her PCP, Dr. Leandrew [name], revealing a patent stent in her right popliteal artery and a patent right peroneal. 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 She saw [name] back in the wound care clinic for further evaluation who referred her back to [name] for consideration of left below-the-knee intervention for limb salvage.   Hospital Course     Underwent successful left peroneal CTO PTA and DES x2. Noted to not have "in-line flow" but now widely patent peroneal which supplied collaterals to the distal PT. She was already on DAPT prior to admission. Post cath labs showed Cr 1.16, and Hgb 9.2.  No complications noted post cath. Able to ambulate without difficulty.    General: Well developed, well nourished, female appearing in no acute distress. Head: Normocephalic, atraumatic.  Neck: Supple without bruits, JVD. Lungs:  Resp regular and unlabored, CTA. Heart: RRR, S1, S2, no S3, S4, or murmur; no rub. Abdomen: Soft, non-tender, non-distended with normoactive bowel sounds. No hepatomegaly. No rebound/guarding. No obvious abdominal masses. Extremities: No  clubbing, cyanosis, edema. Distal pedal pulses are 2+ bilaterally. R femoral cath site stable without bruising or hematoma Neuro: Alert and oriented X 3. Moves all extremities spontaneously. Psych: Normal affect.  [name] was seen by [name] and determined stable for discharge home. Follow up in the office has been arranged. Medications are listed below.   _____________  Discharge Vitals Blood pressure 114/61, pulse 87, temperature 97.6 F (36.4 C), temperature source Oral, resp. rate 15, height 5\' 3"  (1.6 m), weight 169 lb 12.1 oz (77 kg), SpO2 97 %.  Filed Weights   08/01/17 0556 08/02/17 0155  Weight: 166 lb (75.3 kg) 169 lb 12.1 oz (77 kg)    Labs & Radiologic Studies    CBC Recent Labs    08/02/17 0404  WBC 8.1  HGB 9.2*  HCT 29.5*  MCV 93.1  PLT 304   Basic Metabolic Panel Recent Labs    40/98/11 0404  NA 140  K 3.7  CL 107  CO2 19*  GLUCOSE 137*  BUN 18  CREATININE 1.16*  CALCIUM 9.1   Liver Function Tests No results for input(s): AST, ALT, ALKPHOS, BILITOT, PROT, ALBUMIN in the last 72 hours. No results for input(s): LIPASE, AMYLASE in the last 72 hours. Cardiac Enzymes No results for input(s): CKTOTAL, CKMB, CKMBINDEX, TROPONINI in the last 72 hours. BNP Invalid input(s): POCBNP D-Dimer No results for input(s): DDIMER in the last 72 hours. Hemoglobin A1C No results for input(s): HGBA1C in the last 72 hours. Fasting Lipid Panel No results for input(s): CHOL, HDL, LDLCALC, TRIG, CHOLHDL, LDLDIRECT in the last 72 hours. Thyroid Function Tests No results for input(s): TSH, T4TOTAL, T3FREE, THYROIDAB in the last 72 hours.  Invalid input(s): FREET3 _____________  No results found. Disposition   Pt is being discharged home today in good condition.  Follow-up Plans & Appointments    Follow-up Information    [name] Follow up on 08/15/2017.   Specialty:  Cardiology Why:  at 11am for your follow up dopplers.  Contact  information: [address] [place] [address] [phone]       Runell Gess, MD Follow up on 08/16/2017.   Specialties:  Cardiology, Radiology Why:  at 11:30am for your follow up appt.  Contact information: [address] [place] [address] [phone]          Discharge Instructions    Call MD for:  redness, tenderness, or signs of infection (pain, swelling, redness, odor or green/yellow discharge around incision site)   Complete by:  As directed    Diet - low sodium heart healthy   Complete by:  As directed    Discharge instructions   Complete by:  As directed    Groin Site Care Refer to this sheet in the next few weeks. These instructions provide you with information on caring for yourself after your procedure. Your caregiver may also give you more specific instructions. Your treatment has been planned according to current medical practices, but problems sometimes occur. Call your caregiver if you have any problems  or questions after your procedure. HOME CARE INSTRUCTIONS You may shower 24 hours after the procedure. Remove the bandage (dressing) and gently wash the site with plain soap and water. Gently pat the site dry.  Do not apply powder or lotion to the site.  Do not sit in a bathtub, swimming pool, or whirlpool for 5 to 7 days.  No bending, squatting, or lifting anything over 10 pounds (4.5 kg) as directed by your caregiver.  Inspect the site at least twice daily.  Do not drive home if you are discharged the same day of the procedure. Have someone else drive you.  You may drive 24 hours after the procedure unless otherwise instructed by your caregiver.  What to expect: Any bruising will usually fade within 1 to 2 weeks.  Blood that collects in the tissue (hematoma) may be painful to the touch. It should usually decrease in size and tenderness within 1 to 2 weeks.  SEEK IMMEDIATE MEDICAL CARE IF: You have unusual pain  at the groin site or down the affected leg.  You have redness, warmth, swelling, or pain at the groin site.  You have drainage (other than a small amount of blood on the dressing).  You have chills.  You have a fever or persistent symptoms for more than 72 hours.  You have a fever and your symptoms suddenly get worse.  Your leg becomes pale, cool, tingly, or numb.  You have heavy bleeding from the site. Hold pressure on the site. Marland Kitchen  PLEASE DO NOT MISS ANY DOSES OF YOUR PLAVIX!!!!! Also keep a log of you blood pressures and bring back to your follow up appt. Please call the office with any questions.   Patients taking blood thinners should generally stay away from medicines like ibuprofen, Advil, Motrin, naproxen, and Aleve due to risk of stomach bleeding. You may take Tylenol as directed or talk to your primary doctor about alternatives.   Increase activity slowly   Complete by:  As directed       Discharge Medications     Medication List    TAKE these medications   acetaminophen 500 MG tablet Commonly known as:  TYLENOL Take 1,000 mg by mouth daily as needed for moderate pain.   albuterol 108 (90 Base) MCG/ACT inhaler Commonly known as:  PROVENTIL HFA;VENTOLIN HFA Inhale 2 puffs into the lungs every 6 (six) hours as needed for wheezing or shortness of breath.   amiodarone 200 MG tablet Commonly known as:  PACERONE Take 1 tablet (200 mg total) daily by mouth.   apixaban 5 MG Tabs tablet Commonly known as:  ELIQUIS Take 1 tablet (5 mg total) by mouth 2 (two) times daily.   aspirin 81 MG EC tablet Take 1 tablet (81 mg total) by mouth daily.   carvedilol 6.25 MG tablet Commonly known as:  COREG Take 1 tablet (6.25 mg total) by mouth 2 (two) times daily.   clopidogrel 75 MG tablet Commonly known as:  PLAVIX Take 1 tablet (75 mg total) by mouth daily.   HYDROcodone-acetaminophen 7.5-325 MG tablet Commonly known as:  NORCO Take 1 tablet by mouth every 6 (six) hours as  needed (for pain.).   levothyroxine 175 MCG tablet Commonly known as:  SYNTHROID, LEVOTHROID Take 175 mcg by mouth daily before breakfast.   losartan 25 MG tablet Commonly known as:  COZAAR Take 0.5 tablets (12.5 mg total) by mouth daily.   Magnesium 250 MG Tabs Take 250 mg by mouth daily.  magnesium hydroxide 400 MG/5ML suspension Commonly known as:  MILK OF MAGNESIA Take 30 mLs by mouth daily as needed for mild constipation.   nitroGLYCERIN 0.2 mg/hr patch Commonly known as:  NITRODUR - Dosed in mg/24 hr PLACE 1 PATCH ONTO THE SKIN DAILY AS NEEDED FOR CHEST PAINS   oxymetazoline 0.05 % nasal spray Commonly known as:  AFRIN Place 1 spray into both nostrils daily as needed.   pantoprazole 40 MG tablet Commonly known as:  PROTONIX Take 1 tablet (40 mg total) by mouth daily.   potassium chloride 10 MEQ tablet Commonly known as:  K-DUR,KLOR-CON Take 2 tablets (20 mEq total) by mouth 2 (two) times daily.   promethazine 25 MG tablet Commonly known as:  PHENERGAN Take 25 mg by mouth every 4 (four) hours as needed for nausea/vomiting.   torsemide 20 MG tablet Commonly known as:  DEMADEX Take 2 tablets (40 mg total) by mouth 2 (two) times daily. May also take 1 tablet (20 mg total) as needed (for 3lb weight gain in 24 hours).         Outstanding Labs/Studies   Follow up dopplers  Duration of Discharge Encounter   Greater than 30 minutes including physician time.  Signed, Laverda Page NP-C 08/02/2017, 9:29 AM   I have examined the patient and reviewed assessment and plan and discussed with patient.  Agree with above as stated.  Right groin stable.  Palpable femoral pulse.  No hematoma.  Right DP pulse can be obtained with Doppler.  F/u PV care with Dr. Allyson Sabal.   OK for discharge.  Will need to consider stopping aspirin in 30 days to reduce bleeding risk, since she is on clopidogrel and Eliquis.  Lance Muss

## 2017-08-01 NOTE — Progress Notes (Signed)
Site area: right groin  Site Prior to Removal:  Level 0  Pressure Applied For 20 MINUTES    Minutes Beginning at 1140  Manual:   Yes.    Patient Status During Pull:  stable  Post Pull Groin Site:  Level 0  Post Pull Instructions Given:  Yes.    Post Pull Pulses Present:  Yes.    Dressing Applied:  Yes.    Comments:  Difficulty controlling bleed initially, blood loss approx. 100 cc

## 2017-08-02 DIAGNOSIS — Z7901 Long term (current) use of anticoagulants: Secondary | ICD-10-CM | POA: Diagnosis not present

## 2017-08-02 DIAGNOSIS — I70245 Atherosclerosis of native arteries of left leg with ulceration of other part of foot: Secondary | ICD-10-CM | POA: Diagnosis not present

## 2017-08-02 DIAGNOSIS — I998 Other disorder of circulatory system: Secondary | ICD-10-CM

## 2017-08-02 DIAGNOSIS — I481 Persistent atrial fibrillation: Secondary | ICD-10-CM

## 2017-08-02 LAB — CBC
HCT: 29.5 % — ABNORMAL LOW (ref 36.0–46.0)
HEMOGLOBIN: 9.2 g/dL — AB (ref 12.0–15.0)
MCH: 29 pg (ref 26.0–34.0)
MCHC: 31.2 g/dL (ref 30.0–36.0)
MCV: 93.1 fL (ref 78.0–100.0)
Platelets: 304 10*3/uL (ref 150–400)
RBC: 3.17 MIL/uL — ABNORMAL LOW (ref 3.87–5.11)
RDW: 15.9 % — AB (ref 11.5–15.5)
WBC: 8.1 10*3/uL (ref 4.0–10.5)

## 2017-08-02 LAB — GLUCOSE, CAPILLARY: GLUCOSE-CAPILLARY: 118 mg/dL — AB (ref 65–99)

## 2017-08-02 LAB — BASIC METABOLIC PANEL
Anion gap: 14 (ref 5–15)
BUN: 18 mg/dL (ref 6–20)
CALCIUM: 9.1 mg/dL (ref 8.9–10.3)
CO2: 19 mmol/L — AB (ref 22–32)
CREATININE: 1.16 mg/dL — AB (ref 0.44–1.00)
Chloride: 107 mmol/L (ref 101–111)
GFR calc Af Amer: 54 mL/min — ABNORMAL LOW (ref >60)
GFR, EST NON AFRICAN AMERICAN: 47 mL/min — AB (ref >60)
GLUCOSE: 137 mg/dL — AB (ref 65–99)
Potassium: 3.7 mmol/L (ref 3.5–5.1)
Sodium: 140 mmol/L (ref 135–145)

## 2017-08-03 ENCOUNTER — Other Ambulatory Visit: Payer: Self-pay | Admitting: Cardiovascular Disease

## 2017-08-03 ENCOUNTER — Ambulatory Visit: Payer: Medicare Other | Admitting: Internal Medicine

## 2017-08-03 DIAGNOSIS — I739 Peripheral vascular disease, unspecified: Secondary | ICD-10-CM

## 2017-08-09 ENCOUNTER — Ambulatory Visit (HOSPITAL_COMMUNITY): Payer: Medicare Other | Attending: Internal Medicine

## 2017-08-09 DIAGNOSIS — R9439 Abnormal result of other cardiovascular function study: Secondary | ICD-10-CM | POA: Insufficient documentation

## 2017-08-09 DIAGNOSIS — I771 Stricture of artery: Secondary | ICD-10-CM | POA: Diagnosis not present

## 2017-08-09 DIAGNOSIS — I739 Peripheral vascular disease, unspecified: Secondary | ICD-10-CM | POA: Insufficient documentation

## 2017-08-09 DIAGNOSIS — Z95820 Peripheral vascular angioplasty status with implants and grafts: Secondary | ICD-10-CM | POA: Insufficient documentation

## 2017-08-10 ENCOUNTER — Other Ambulatory Visit
Admission: RE | Admit: 2017-08-10 | Discharge: 2017-08-10 | Disposition: A | Discharge status: Home / Self Care | Payer: Medicare Other | Source: Ambulatory Visit | Attending: Internal Medicine | Admitting: Internal Medicine

## 2017-08-10 ENCOUNTER — Encounter: Payer: Medicare Other | Attending: Internal Medicine | Admitting: Internal Medicine

## 2017-08-10 DIAGNOSIS — L97512 Non-pressure chronic ulcer of other part of right foot with fat layer exposed: Secondary | ICD-10-CM | POA: Insufficient documentation

## 2017-08-10 DIAGNOSIS — I4891 Unspecified atrial fibrillation: Secondary | ICD-10-CM | POA: Insufficient documentation

## 2017-08-10 DIAGNOSIS — E11621 Type 2 diabetes mellitus with foot ulcer: Secondary | ICD-10-CM | POA: Diagnosis not present

## 2017-08-10 DIAGNOSIS — Z955 Presence of coronary angioplasty implant and graft: Secondary | ICD-10-CM | POA: Insufficient documentation

## 2017-08-10 DIAGNOSIS — E785 Hyperlipidemia, unspecified: Secondary | ICD-10-CM | POA: Diagnosis not present

## 2017-08-10 DIAGNOSIS — I255 Ischemic cardiomyopathy: Secondary | ICD-10-CM | POA: Diagnosis not present

## 2017-08-10 DIAGNOSIS — B9561 Methicillin susceptible Staphylococcus aureus infection as the cause of diseases classified elsewhere: Secondary | ICD-10-CM | POA: Insufficient documentation

## 2017-08-10 DIAGNOSIS — I110 Hypertensive heart disease with heart failure: Secondary | ICD-10-CM | POA: Insufficient documentation

## 2017-08-10 DIAGNOSIS — E1151 Type 2 diabetes mellitus with diabetic peripheral angiopathy without gangrene: Secondary | ICD-10-CM | POA: Diagnosis not present

## 2017-08-10 DIAGNOSIS — Z951 Presence of aortocoronary bypass graft: Secondary | ICD-10-CM | POA: Insufficient documentation

## 2017-08-10 DIAGNOSIS — D649 Anemia, unspecified: Secondary | ICD-10-CM | POA: Insufficient documentation

## 2017-08-10 DIAGNOSIS — I252 Old myocardial infarction: Secondary | ICD-10-CM | POA: Insufficient documentation

## 2017-08-10 DIAGNOSIS — I509 Heart failure, unspecified: Secondary | ICD-10-CM | POA: Diagnosis not present

## 2017-08-10 DIAGNOSIS — Z87891 Personal history of nicotine dependence: Secondary | ICD-10-CM | POA: Insufficient documentation

## 2017-08-10 DIAGNOSIS — B9689 Other specified bacterial agents as the cause of diseases classified elsewhere: Secondary | ICD-10-CM | POA: Diagnosis not present

## 2017-08-10 DIAGNOSIS — G629 Polyneuropathy, unspecified: Secondary | ICD-10-CM | POA: Diagnosis not present

## 2017-08-10 DIAGNOSIS — J45909 Unspecified asthma, uncomplicated: Secondary | ICD-10-CM | POA: Diagnosis not present

## 2017-08-10 DIAGNOSIS — L97529 Non-pressure chronic ulcer of other part of left foot with unspecified severity: Secondary | ICD-10-CM | POA: Diagnosis not present

## 2017-08-10 DIAGNOSIS — Z1620 Resistance to unspecified antibiotic: Secondary | ICD-10-CM | POA: Insufficient documentation

## 2017-08-10 DIAGNOSIS — I2510 Atherosclerotic heart disease of native coronary artery without angina pectoris: Secondary | ICD-10-CM | POA: Insufficient documentation

## 2017-08-10 DIAGNOSIS — Z7902 Long term (current) use of antithrombotics/antiplatelets: Secondary | ICD-10-CM | POA: Diagnosis not present

## 2017-08-10 DIAGNOSIS — X58XXXA Exposure to other specified factors, initial encounter: Secondary | ICD-10-CM | POA: Diagnosis not present

## 2017-08-10 DIAGNOSIS — S90932A Unspecified superficial injury of left great toe, initial encounter: Secondary | ICD-10-CM | POA: Insufficient documentation

## 2017-08-11 NOTE — Progress Notes (Signed)
[name], [name] ([id number]) Visit Report for 08/10/2017 HPI Details Patient Name: [name], [name] Date of Service: 08/10/2017 8:45 AM Medical Record Number: [id number] Patient Account Number: [id number] Date of Birth/Sex: 11-Sep-1945 (72 y.o. Female) Treating RN: Ashok Cordia, Debi Primary Care Provider: Quintin Alto Other Clinician: Referring Provider: Quintin Alto Treating Provider/Extender: Altamese Wortham in Treatment: 8 History of Present Illness Location: right foot toes Quality: Patient reports experiencing a sharp pain to affected area(s). Severity: Patient states wound are getting worse. Duration: Patient has had the wound for < 2 weeks prior to presenting for treatment Timing: Pain in wound is constant (hurts all the time) Context: The wound appeared gradually over time Modifying Factors: Other treatment(s) tried include:recent interventional process to place a stent in her right lower extremity for critical limb ischemia Associated Signs and Symptoms: Patient reports having foul odor. HPI Description: 72 year old female who was referred by Dr. Nanetta Batty, who performed a endovascular procedure on 05/26/2017, for critical limb ischemia. Her angiogram showed a 99% below the knee popliteal stenosis which is stented with a 4 mm drug eluting stent. On 06/10/2017 he noted that her pulses are palpable and her Dopplers have normalized. He referred her to wound care for further advice and put her on Keflex 500 mg twice daily for 10 days. She has a past medical history of asthma, coronary artery disease, CHF, hyperlipidemia, ischemic cardiomyopathy, peripheral arterial disease and type 2 diabetes mellitus. She is status post recent abdominal aortogram with right lower extremity stenting, cholecystectomy, CABG, parathyroidectomy, thyroidectomy. She is a former smoker and quit in 1988. She had an ABI performed on 06/10/2017 with the right ABI within normal range and the left  ABI shows mild lower extremity arterial disease. The right ABI was 1.0 and biphasic and the left ABI was 0.93 and biphasic. Toe pressures were abnormal with the right being 0.27 and left being 0.31. the duplex study done showed a patent right popliteal artery stent without evidence of focal stenosis or obstruction. of note the patient started having signs of critical limb ischemia somewhere at the end of November and after an urgent workup was taken up for limb salvage with a successfully placed stent by Dr. Nanetta Batty, with good arterial perfusion postprocedure with normal ABIs noted on 06/10/2017. 06/22/17; patient was admitted to our clinic last week by Dr. Meyer Russel.she is a type II diabetic. She had undergone a right popliteal DES on 11/29 for critical limb ischemia including erythema and pain of her right forefoot. She states about 3 days later she started developing ulcerations in predominantly the right foot but also the left third toe. 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 This is never really healed rib. Repeat ABIs on 12/14 showed a right ABI of 1 and a left ABI of 0.93 with toe pressures be markedly abnormal as noted above and Dr. Marcie Bal notes. The patient is still having a lot of pain making it very difficult for her to function at home this is worse on the right greater than left foot she does describe severe lancinating pain. She does not ambulate all that much and she finds it intolerable to wear footwear Her history is also notable for A. fib, severe cardiomyopathy with an ejection fraction of 25%_30%. Last echocardiogram in June of this year. According to Epic she is on a combination of Eliquis plavix and aspirin. 06/29/17; patient has an appointment with Dr. Imogene Burn of vascular surgery I believe  next week. This was arranged by her primary physician. She is still having a lot of pain in the right side in general most of her toes  look better however. Supposed to be using silver alginate however [facility] applying currently applied wet to dry dressings. 07/06/17; the patient went for appointment with Dr. Imogene Burn. She is status post right popliteal stenting with peroneal artery angioplasty with Dr. Gery Pray. She presented to our clinic with bilateral lower extremity wounds in her toes which are exceptionally painful. Dr. Imogene Burn put her on a course of antibiotics. Her arterial studies from 06/10/17 showed an ABI in the right of 1 and ABI in the left of 0.8. Waveforms at the dorsalis pedis and posterior tibial were biphasic. However routine ABIs were Brenn, [name] ([id number]) 0.31 bilaterally. The patient has severe bilateral rest pain. 07/20/17; the patient had her arteriogram by Dr. Imogene Burn on 07/14/17. Based on the images the patient was not felt to require any interventions and no further interventions were possible due to small size of the tibial arteries. The posterior tibial arteries bilaterally were occluded heel arteries were the dominant runoff which were miniscule. Anterior tibials both showed subtotal occlusion in the mid segment and these were small vessels. Paradoxically the patient complains of a lot less pain in the right foot however she is having a lot of pain in the left first and left third toes. She is managing this with activity limitation and narcotics. 07/27/17; I've spoken to Dr. Allyson Sabal today and he is going to do another arteriogram on the left leg early next week with possibility of retrograde arterial intervention. The patient is still having a lot of pain in the left third toe and left first toe which is worse when she puts her leg up i.e. lies in bed etc. 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 She has done better on the right which is the side where she had the DES stent placed in the right popliteal. She still has a wound on the medial aspect of the third toe and black eschar at the tip of the  fourth and fifth toes however most of the rest of the right foot looks better. 08/10/17; the patient was taken for angiography on 08/01/17; she had a successful left peroneal PTA and drug eluding stent now has a widely patent peroneal supply collateral to the distal posterior tibial that feeds the dorsal pedal arch. The patient is on antiplatelet therapy. Unfortunately she has not had much relief of pain in the left great and third toe. She has had an increase in her hydrocodone by her primary physician. She had follow-up noninvasive Doppler test done yesterday although I cannot pull up these results Electronic Signature(s) Signed: 08/10/2017 4:45:41 PM By: Baltazar Najjar MD Entered By: Baltazar Najjar on 08/10/2017 11:01:50 Mathe, [name] ([id number]) -------------------------------------------------------------------------------- Physical Exam Details Patient Name: [name] Date of Service: 08/10/2017 8:45 AM Medical Record Number: [id number] Patient Account Number: [id number] Date of Birth/Sex: 01-11-1946 (71 y.o. Female) Treating RN: Ashok Cordia, Debi Primary Care Provider: Quintin Alto Other Clinician: Referring Provider: Quintin Alto Treating Provider/Extender: Altamese Schuyler in Treatment: 8 Constitutional Patient is hypotensive. She does not appear dehydrated. Pulse regular and within target range for patient.Marland Kitchen Respirations regular, non-labored and within target range.. Temperature is normal and within the target range for the patient.. Doesn't look particularly well. She tells me she vomited once this morning. Cardiovascular Heart rhythm and rate regular, without murmur or gallop.. Dorsalis pedis  pulse faintly on the right not on the left.. Gastrointestinal (GI) Abdomen is soft and non-distended without masses or tenderness. Bowel sounds active in all quadrants.. No liver or spleen enlargement or tenderness.. Integumentary (Hair, Skin) No systemic integumentary  issues. Psychiatric Patient appears depressed today.. Notes Wound exam; the right foot has small open necrotic areas on the right second third toes medially. She also has a lot of pain at the base of the fifth toe but I can't actually see an open area here. oDry gangrene over the Of her left first toe. There is an odor here. Drainage coming out of the medial part of the first toe which is actually her oldest wound. The third toe dorsally looks about the same dry gangrene anteriorly. I have done a culture of the medial part of the first toe. Electronic Signature(s) Signed: 08/10/2017 4:45:41 PM By: Baltazar Najjar MD Entered By: Baltazar Najjar on 08/10/2017 11:05:15 Avena, [name] ([id number]) -------------------------------------------------------------------------------- Physician Orders Details Patient Name: [name] Date of Service: 08/10/2017 8:45 AM Medical Record Number: [id number] Patient Account Number: [id number] Date of Birth/Sex: 09/21/1945 (71 y.o. Female) Treating RN: Ashok Cordia, Debi Primary Care Provider: Quintin Alto Other Clinician: Referring Provider: Quintin Alto Treating Provider/Extender: Altamese Alva in Treatment: 8 Verbal / Phone Orders: Yes Clinician: Pinkerton, Debi Read Back and Verified: Yes Diagnosis Coding Wound Cleansing Wound #1 Right,Distal Toe - Web between 2nd and 3rd o Clean wound with Normal Saline. Wound #2 Right Toe - Web between 3rd and 4th o Clean wound with Normal Saline. Wound #4 Right,Dorsal Toe Fifth o Clean wound with Normal Saline. Wound #5 Left,Distal Toe Great o Clean wound with Normal Saline. Wound #6 Left,Lateral Toe Great o Clean wound with Normal Saline. Wound #7 Left Toe Third o Clean wound with Normal Saline. Anesthetic (add to Medication List) Wound #1 Right,Distal Toe - Web between 2nd and 3rd o Topical Lidocaine 4% cream applied to wound bed prior to debridement (In Clinic  Only). Wound #2 Right Toe - Web between 3rd and 4th o Topical Lidocaine 4% cream applied to wound bed prior to debridement (In Clinic Only). Wound #4 Right,Dorsal Toe Fifth o Topical Lidocaine 4% cream applied to wound bed prior to debridement (In Clinic Only). Wound #5 Left,Distal Toe Great o Topical Lidocaine 4% cream applied to wound bed prior to debridement (In Clinic Only). Wound #6 Left,Lateral Toe Great o Topical Lidocaine 4% cream applied to wound bed prior to debridement (In Clinic Only). Wound #7 Left Toe Third o Topical Lidocaine 4% cream applied to wound bed prior to debridement (In Clinic Only). Primary Wound Dressing Wound #1 Right,Distal Toe - Web between 2nd and 3rd o Silvercel Non-Adherent - silvercel between all toes on right foot down to base of web and cover wounds with silvercel strip Wound #2 Right Toe - Web between 3rd and 4th Pleitez, RASHEEDA MULVEHILL ([id number]) o Silvercel Non-Adherent - silvercel between all toes on right foot down to base of web and cover wounds with silvercel strip Wound #4 Right,Dorsal Toe Fifth o Silvercel Non-Adherent - silvercel between all toes on right foot down to base of web and cover wounds with silvercel strip Wound #5 Left,Distal Toe Great o Silvercel Non-Adherent o Other: - paint with Betadine then place on silvercel Wound #6 Left,Lateral Toe Great o Silvercel Non-Adherent o Other: - paint with betadine and then place on silvercel Wound #7 Left Toe Third o Silvercel Non-Adherent o Other: - paint with betadine and then place on silvercel  Secondary Dressing Wound #1 Right,Distal Toe - Web between 2nd and 3rd o ABD pad o Conform/Kerlix Wound #2 Right Toe - Web between 3rd and 4th o ABD pad o Conform/Kerlix Wound #4 Right,Dorsal Toe Fifth o ABD pad o Conform/Kerlix Wound #5 Left,Distal Toe Great o ABD pad o Conform/Kerlix Wound #6 Left,Lateral Toe Great o ABD pad o  Conform/Kerlix Wound #7 Left Toe Third o ABD pad o Conform/Kerlix Dressing Change Frequency Wound #1 Right,Distal Toe - Web between 2nd and 3rd o Change dressing every day. Wound #2 Right Toe - Web between 3rd and 4th o Change dressing every day. Wound #4 Right,Dorsal Toe Fifth o Change dressing every day. Wound #5 Left,Distal Toe Great o Change dressing every day. Wollschlager, Skylynn GMarland Kitchen ([id number]) Wound #6 Left,Lateral Toe Great o Change dressing every day. Wound #7 Left Toe Third o Change dressing every day. Follow-up Appointments Wound #1 Right,Distal Toe - Web between 2nd and 3rd o Return Appointment in 1 week. Wound #2 Right Toe - Web between 3rd and 4th o Return Appointment in 1 week. Wound #4 Right,Dorsal Toe Fifth o Return Appointment in 1 week. Wound #5 Left,Distal Toe Great o Return Appointment in 1 week. Wound #6 Left,Lateral Toe Great o Return Appointment in 1 week. Wound #7 Left Toe Third o Return Appointment in 1 week. Additional Orders / Instructions Wound #1 Right,Distal Toe - Web between 2nd and 3rd o Increase protein intake. Wound #2 Right Toe - Web between 3rd and 4th o Increase protein intake. Wound #4 Right,Dorsal Toe Fifth o Increase protein intake. Wound #5 Left,Distal Toe Great o Increase protein intake. Wound #6 Left,Lateral Toe Great o Increase protein intake. Wound #7 Left Toe Third o Increase protein intake. Home Health Wound #1 Right,Distal Toe - Web between 2nd and 3rd o Continue Home Health Visits o Home Health Nurse may visit PRN to address patientos wound care needs. o FACE TO FACE ENCOUNTER: MEDICARE and MEDICAID PATIENTS: I certify that this patient is under my care and that I had a face-to-face encounter that meets the physician face-to-face encounter requirements with this patient on this date. The encounter with the patient was in whole or in part for the following MEDICAL CONDITION:  (primary reason for Home Healthcare) MEDICAL NECESSITY: I certify, that based on my findings, NURSING services are a medically necessary home health service. HOME BOUND STATUS: I certify that my clinical findings support that this patient is homebound (i.e., Due to illness or injury, pt requires aid of supportive devices such as crutches, cane, wheelchairs, walkers, the use of special transportation or the assistance of another person to leave their place of residence. There is a normal inability to leave the home [facility], Reighlyn G. ([id number]) and doing so requires considerable and taxing effort. Other absences are for medical reasons / religious services and are infrequent or of short duration when for other reasons). o If current dressing causes regression in wound condition, may D/C ordered dressing product/s and apply Normal Saline Moist Dressing daily until next Wound Healing Center / Other MD appointment. Notify Wound Healing Center of regression in wound condition at [phone]. o Please direct any NON-WOUND related issues/requests for orders to patient's Primary Care Physician Wound #2 Right Toe - Web between 3rd and 4th o Continue Home Health Visits o Home Health Nurse may visit PRN to address patientos wound care needs. o FACE TO FACE ENCOUNTER: MEDICARE and MEDICAID PATIENTS: I certify that this patient is under my care and that I had  a face-to-face encounter that meets the physician face-to-face encounter requirements with this patient on this date. The encounter with the patient was in whole or in part for the following MEDICAL CONDITION: (primary reason for Home Healthcare) MEDICAL NECESSITY: I certify, that based on my findings, NURSING services are a medically necessary home health service. HOME BOUND STATUS: I certify that my clinical findings support that this patient is homebound (i.e., Due to illness or injury, pt requires aid of supportive devices such as  crutches, cane, wheelchairs, walkers, the use of special transportation or the assistance of another person to leave their place of residence. There is a normal inability to leave the home and doing so requires considerable and taxing effort. Other absences are for medical reasons / religious services and are infrequent or of short duration when for other reasons). o If current dressing causes regression in wound condition, may D/C ordered dressing product/s and apply Normal Saline Moist Dressing daily until next Wound Healing Center / Other MD appointment. Notify Wound Healing Center of regression in wound condition at [phone]. o Please direct any NON-WOUND related issues/requests for orders to patient's Primary Care Physician Wound #4 Right,Dorsal Toe Fifth o Continue Home Health Visits o Home Health Nurse may visit PRN to address patientos wound care needs. o FACE TO FACE ENCOUNTER: MEDICARE and MEDICAID PATIENTS: I certify that this patient is under my care and that I had a face-to-face encounter that meets the physician face-to-face encounter requirements with this patient on this date. The encounter with the patient was in whole or in part for the following MEDICAL CONDITION: (primary reason for Home Healthcare) MEDICAL NECESSITY: I certify, that based on my findings, NURSING services are a medically necessary home health service. HOME BOUND STATUS: I certify that my clinical findings support that this patient is homebound (i.e., Due to illness or injury, pt requires aid of supportive devices such as crutches, cane, wheelchairs, walkers, the use of special transportation or the assistance of another person to leave their place of residence. There is a normal inability to leave the home and doing so requires considerable and taxing effort. Other absences are for medical reasons / religious services and are infrequent or of short duration when for other reasons). o If  current dressing causes regression in wound condition, may D/C ordered dressing product/s and apply Normal Saline Moist Dressing daily until next Wound Healing Center / Other MD appointment. Notify Wound Healing Center of regression in wound condition at [phone]. o Please direct any NON-WOUND related issues/requests for orders to patient's Primary Care Physician Wound #5 Left,Distal Toe Doretha Sou Continue Home Health Visits o Home Health Nurse may visit PRN to address patientos wound care needs. o FACE TO FACE ENCOUNTER: MEDICARE and MEDICAID PATIENTS: I certify that this patient is under my care and that I had a face-to-face encounter that meets the physician face-to-face encounter requirements with this patient on this date. The encounter with the patient was in whole or in part for the following MEDICAL CONDITION: (primary reason for Home Healthcare) MEDICAL NECESSITY: I certify, that based on my findings, NURSING services are a medically necessary home health service. HOME BOUND STATUS: I certify that my clinical findings support that this patient is homebound (i.e., Due to illness or injury, pt requires aid of supportive devices such as crutches, cane, wheelchairs, walkers, the use of special transportation or the assistance of another person to leave their place of residence. There is a normal inability to leave the home  and doing so requires considerable and taxing effort. Other absences are for medical reasons / religious services and are infrequent or of short duration when for other reasons). o If current dressing causes regression in wound condition, may D/C ordered dressing product/s and apply Normal Saline Moist Dressing daily until next Wound Healing Center / Other MD appointment. Notify Wound Healing Center of regression in wound condition at [phone]. Muecke, [name] ([id number]) o Please direct any NON-WOUND related issues/requests for orders to patient's  Primary Care Physician Wound #6 Left,Lateral Toe Doretha Sou Continue Home Health Visits o Home Health Nurse may visit PRN to address patientos wound care needs. o FACE TO FACE ENCOUNTER: MEDICARE and MEDICAID PATIENTS: I certify that this patient is under my care and that I had a face-to-face encounter that meets the physician face-to-face encounter requirements with this patient on this date. The encounter with the patient was in whole or in part for the following MEDICAL CONDITION: (primary reason for Home Healthcare) MEDICAL NECESSITY: I certify, that based on my findings, NURSING services are a medically necessary home health service. HOME BOUND STATUS: I certify that my clinical findings support that this patient is homebound (i.e., Due to illness or injury, pt requires aid of supportive devices such as crutches, cane, wheelchairs, walkers, the use of special transportation or the assistance of another person to leave their place of residence. There is a normal inability to leave the home and doing so requires considerable and taxing effort. Other absences are for medical reasons / religious services and are infrequent or of short duration when for other reasons). o If current dressing causes regression in wound condition, may D/C ordered dressing product/s and apply Normal Saline Moist Dressing daily until next Wound Healing Center / Other MD appointment. Notify Wound Healing Center of regression in wound condition at [phone]. o Please direct any NON-WOUND related issues/requests for orders to patient's Primary Care Physician Wound #7 Left Toe Third o Continue Home Health Visits o Home Health Nurse may visit PRN to address patientos wound care needs. o FACE TO FACE ENCOUNTER: MEDICARE and MEDICAID PATIENTS: I certify that this patient is under my care and that I had a face-to-face encounter that meets the physician face-to-face encounter requirements with this patient  on this date. The encounter with the patient was in whole or in part for the following MEDICAL CONDITION: (primary reason for Home Healthcare) MEDICAL NECESSITY: I certify, that based on my findings, NURSING services are a medically necessary home health service. HOME BOUND STATUS: I certify that my clinical findings support that this patient is homebound (i.e., Due to illness or injury, pt requires aid of supportive devices such as crutches, cane, wheelchairs, walkers, the use of special transportation or the assistance of another person to leave their place of residence. There is a normal inability to leave the home and doing so requires considerable and taxing effort. Other absences are for medical reasons / religious services and are infrequent or of short duration when for other reasons). o If current dressing causes regression in wound condition, may D/C ordered dressing product/s and apply Normal Saline Moist Dressing daily until next Wound Healing Center / Other MD appointment. Notify Wound Healing Center of regression in wound condition at [phone]. o Please direct any NON-WOUND related issues/requests for orders to patient's Primary Care Physician Medications-please add to medication list. Wound #6 Left,Lateral Toe Great o P.O. Antibiotics - start antibiotics as prescribed Laboratory o Bacteria identified in Wound by Culture (MICRO) -  L lateral toe great oooo LOINC Code: 6462-6 oooo Convenience Name: Wound culture routine Patient Medications Allergies: oxycodone, Ranexa, Statins-Hmg-Coa Reductase Inhibitors Notifications Medication Indication Start End lidocaine DOSE 1 - topical 4 % cream - 1 cream topical doxycycline monohydrate 08/10/2017 Kem, Franny G. ([id number]) Notifications Medication Indication Start End cellulitis left great toe DOSE oral 100 mg capsule - 1 capsule oral bid for 7 days Electronic Signature(s) Signed: 08/10/2017 10:30:44 AM By: Baltazar Najjar MD Entered By: Baltazar Najjar on 08/10/2017 10:30:41 Mcwatters, [name] ([id number]) -------------------------------------------------------------------------------- Prescription 08/10/2017 Patient Name: [name] Provider: Maxwell Caul MD Date of Birth: 10/18/1945 NPI#: [id number] Sex: F DEA#: [id number] Phone #: [phone] License #: [id number] Patient Address: [facility] [address] [facility] [place], [place] [address] [name], [address] [place], [place] [address] [phone] Allergies oxycodone Ranexa Statins-Hmg-Coa Reductase Inhibitors Medication Medication: Route: Strength: Form: lidocaine 4 % topical cream topical 4% cream Class: TOPICAL LOCAL ANESTHETICS Dose: Frequency / Time: Indication: 1 1 cream topical Number of Refills: Number of Units: 0 Generic Substitution: Start Date: End Date: One Time Use: Substitution Permitted No Note to Pharmacy: Signature(s): Date(s): Electronic Signature(s) Signed: 08/10/2017 4:45:41 PM By: Baltazar Najjar MD Champney, [name] ([id number]) Entered By: Baltazar Najjar on 08/10/2017 10:30:45 Defilippo, [name] ([id number]) --------------------------------------------------------------------------------  Problem List Details Patient Name: [name] Date of Service: 08/10/2017 8:45 AM Medical Record Number: [id number] Patient Account Number: [id number] Date of Birth/Sex: [birth year] (71 y.o. 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 Female) Treating RN: Ashok Cordia, Debi Primary Care Provider: Quintin Alto Other Clinician: Referring Provider: Quintin Alto Treating Provider/Extender: Altamese [place] in Treatment: 8 Subjective History of Present Illness (HPI) The following HPI elements were documented for the patient's wound: Location: right foot toes Quality: Patient reports experiencing a sharp pain to affected area(s). Severity: Patient states wound are getting worse. Duration: Patient has had the wound for < 2 weeks prior to presenting for treatment Timing: Pain in wound is constant (hurts all the time) Context: The wound appeared gradually over time Modifying Factors: Other treatment(s) tried include:recent interventional process to place a stent in her right lower extremity for critical limb ischemia Associated Signs and Symptoms: Patient reports having foul odor. 72 year old female who was referred by Dr. Nanetta Batty, who performed a endovascular procedure on 05/26/2017, for critical limb ischemia. Her angiogram showed a 99% below the knee popliteal stenosis which is stented with a 4 mm drug eluting stent. On 06/10/2017 he noted that her pulses are palpable and her Dopplers have normalized. He referred her to wound care for further advice and put her on Keflex 500 mg twice daily for  10 days. She has a past  medical history of asthma, coronary artery disease, CHF, hyperlipidemia, ischemic cardiomyopathy, peripheral arterial disease and type 2 diabetes mellitus. She is status post recent abdominal aortogram with right lower extremity stenting, cholecystectomy, CABG, parathyroidectomy, thyroidectomy. She is a former smoker and quit in 1988. 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 The right ABI was 1.0 and biphasic and the left ABI was 0.93 and biphasic. Toe pressures were abnormal with the right being 0.27 and left being 0.31. the duplex study done showed a patent right popliteal artery stent without evidence of focal stenosis or obstruction. of note the patient started having signs of critical limb ischemia somewhere at the end of November and after an urgent workup was taken up for limb salvage with a successfully placed stent by Dr. Nanetta Batty, with good arterial perfusion postprocedure with normal ABIs noted on 06/10/2017. 06/22/17; patient was admitted to our clinic last week by Dr. Meyer Russel.she is a type II diabetic. She had undergone a right popliteal DES on 11/29 for critical limb ischemia including erythema and pain of her right forefoot. She states about 3 days later she started developing ulcerations in predominantly the right foot but also the left third toe. 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 This is never really healed rib. Repeat ABIs on 12/14 showed a right ABI of 1 and a left ABI of 0.93 with toe pressures be markedly abnormal as noted above and Dr. Marcie Bal notes. The patient is still having a lot of pain making it very difficult for her to function at home this is worse on the right greater than left foot she does describe severe lancinating pain. She does not ambulate all that much and she finds it  intolerable to wear footwear Her history is also notable for A. fib, severe cardiomyopathy with an ejection fraction of 25%_30%. Last echocardiogram in June of this year. According to Epic she is on a combination of Eliquis plavix and aspirin. 06/29/17; patient has an appointment with Dr. Imogene Burn of vascular surgery I believe next week. This was arranged by her primary physician. She is still having a lot of pain in the right side in general most of her toes look better however. Supposed to be using silver alginate however [facility] applying currently applied wet to dry dressings. 07/06/17; the patient went for appointment with Dr. Imogene Burn. She is status post right popliteal stenting with peroneal artery angioplasty with Dr. Gery Pray. She presented to our clinic with bilateral lower extremity wounds in her toes which are exceptionally painful. Dr. Imogene Burn put her on a course of antibiotics. Her arterial studies from 06/10/17 showed an ABI in the right of 1 and ABI in the left of 0.8. Waveforms at the dorsalis pedis and posterior tibial were biphasic. However routine ABIs were Daleo, Hilari G. ([id number]) 0.31 bilaterally. The patient has severe bilateral rest pain. 07/20/17; the patient had her arteriogram by Dr. Imogene Burn on 07/14/17. Based on the images the patient was not felt to require any interventions and no further interventions were possible due to small size of the tibial arteries. The posterior tibial arteries bilaterally were occluded heel arteries were the dominant runoff which were miniscule. Anterior tibials both showed subtotal occlusion in the mid segment and these were small vessels. Paradoxically the patient complains of a lot less pain in the right foot however she is having  a lot of pain in the left first and left third toes. She is managing this with activity limitation and narcotics. 07/27/17; I've spoken to Dr. Allyson Sabal today and he is going to do another arteriogram on the left leg early  next week with possibility of retrograde arterial intervention. The patient is still having a lot of pain in the left third toe and left first toe which is worse when she puts her leg up i.e. lies in bed etc. 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 She has done better on the right which is the side where she had the DES stent placed in the right popliteal. She still has a wound on the medial aspect of the third toe and black eschar at the tip of the fourth and fifth toes however most of the rest of the right foot looks better. 08/10/17; the patient was taken for angiography on 08/01/17; she had a successful left peroneal PTA and drug eluding stent now has a widely patent peroneal supply collateral to the distal posterior tibial that feeds the dorsal pedal arch. The patient is on antiplatelet therapy. Unfortunately she has not had much relief of pain in the left great and third toe. She has had an increase in her hydrocodone by her primary physician. She had follow-up noninvasive Doppler test done yesterday although I cannot pull up these results Objective Constitutional Patient is hypotensive. She does not appear dehydrated. Pulse regular and within target range for patient.Marland Kitchen Respirations regular, non-labored and within target range.. Temperature is normal and within the target range for the patient.. Doesn't look particularly well. She tells me she vomited once this morning. Vitals Time Taken: 9:03 AM, Height: 63 in, Weight: 176 lbs, BMI: 31.2, Temperature: 98.4 F, Pulse: 91 bpm, Respiratory Rate: 18 breaths/min, Blood Pressure: 86/64 mmHg. General Notes: Pts bp was 86/64 on the dinamap and then bp was taken manually and it was 90/62. Cardiovascular Heart rhythm and rate regular, without murmur or gallop.. Dorsalis pedis pulse faintly on the right not on the left.. Gastrointestinal (GI) Abdomen is soft and non-distended without masses or tenderness. Bowel sounds active in all quadrants.. No  liver or spleen enlargement or tenderness.Marland Kitchen Psychiatric Patient appears depressed today.. General Notes: Wound exam; the right foot has small open necrotic areas on the right second third toes medially. She also has a lot of pain at the base of the fifth toe but I can't actually see an open area here. Dry gangrene over the Of her left first toe. There is an odor here. Drainage coming out of the medial part of the first toe which is actually her oldest wound. The third toe dorsally looks about the same dry gangrene anteriorly. I have done a culture of the medial part of the first toe. Integumentary (Hair, Skin) No systemic integumentary issues. Wound #1 status is Open. Original cause of wound was Gradually Appeared. The wound is located on the Right,Distal Toe - Web between 2nd and 3rd. The wound measures 0.7cm length x 0.6cm width x 0.1cm depth; 0.33cm^2 area and 0.033cm^3 [place], Anjeanette G. ([id number]) volume. There is no tunneling or undermining noted. There is a large amount of serous drainage noted. Foul odor after cleansing was noted. The wound margin is distinct with the outline attached to the wound base. There is no granulation within the wound bed. There is a large (67-100%) amount of necrotic tissue within the wound bed including Adherent Slough. The periwound skin appearance exhibited: Maceration. Periwound temperature was  noted as No Abnormality. The periwound has tenderness on palpation. Wound #2 status is Open. Original cause of wound was Pressure Injury. The wound is located on the Right Toe - Web between 3rd and 4th. The wound measures 0.5cm length x 0.8cm width x 0.1cm depth; 0.314cm^2 area and 0.031cm^3 volume. There is no tunneling or undermining noted. There is a large amount of serous drainage noted. Foul odor after cleansing was noted. The wound margin is distinct with the outline attached to the wound base. There is no granulation within the wound bed. There is a large  (67-100%) amount of necrotic tissue within the wound bed including Adherent Slough. The periwound skin appearance exhibited: Maceration. Periwound temperature was noted as No Abnormality. The periwound has tenderness on palpation. Wound #4 status is Open. Original cause of wound was Gradually Appeared. The wound is located on the Right,Dorsal Toe Fifth. The wound measures 0.5cm length x 0.7cm width x 0.1cm depth; 0.275cm^2 area and 0.027cm^3 volume. There is no tunneling or undermining noted. There is a medium amount of serous drainage noted. The wound margin is flat and intact. There is no granulation within the wound bed. There is a large (67-100%) amount of necrotic tissue within the wound bed including Eschar and Adherent Slough. The periwound skin appearance exhibited: Dry/Scaly, Maceration. The periwound skin appearance did not exhibit: Callus, Crepitus, Excoriation, Induration, Rash, Scarring, Atrophie Blanche, Cyanosis, Ecchymosis, Hemosiderin Staining, Mottled, Pallor, Rubor, Erythema. Periwound temperature was noted as No Abnormality. The periwound has tenderness on palpation. Wound #5 status is Open. Original cause of wound was Gradually Appeared. The wound is located on the Darden RestaurantsLeft,Distal Toe Great. The wound measures 4cm length x 2cm width x 0.1cm depth; 6.283cm^2 area and 0.628cm^3 volume. There is no tunneling or undermining noted. There is a large amount of serous drainage noted. Foul odor after cleansing was noted. The wound margin is flat and intact. There is no granulation within the wound bed. There is a large (67-100%) amount of necrotic tissue within the wound bed including Eschar and Adherent Slough. The periwound skin appearance exhibited: Callus, Induration, Scarring, Dry/Scaly. The periwound skin appearance did not exhibit: Crepitus, Excoriation, Rash, Maceration, Atrophie Blanche, Cyanosis, Ecchymosis, Hemosiderin Staining, Mottled, Pallor, Rubor, Erythema. Periwound  temperature was noted as No Abnormality. The periwound has tenderness on palpation. Wound #6 status is Open. Original cause of wound was Gradually Appeared. The wound is located on the OmnicareLeft,Lateral Toe Great. The wound measures 1.6cm length x 1cm width x 0.1cm depth; 1.257cm^2 area and 0.126cm^3 volume. There is no tunneling or undermining noted. There is a large amount of purulent drainage noted. Foul odor after cleansing was noted. The wound margin is distinct with the outline attached to the wound base. There is no granulation within the wound bed. There is a large (67-100%) amount of necrotic tissue within the wound bed including Eschar and Adherent Slough. The periwound skin appearance exhibited: Callus, Crepitus, Induration, Maceration. The periwound skin appearance did not exhibit: Excoriation, Rash, Scarring, Dry/Scaly, Atrophie Blanche, Cyanosis, Ecchymosis, Hemosiderin Staining, Mottled, Pallor, Rubor, Erythema. Periwound temperature was noted as No Abnormality. The periwound has tenderness on palpation. Wound #7 status is Open. Original cause of wound was Gradually Appeared. The wound is located on the Left Toe Third. The wound measures 2.5cm length x 1.5cm width x 0.1cm depth; 2.945cm^2 area and 0.295cm^3 volume. There is no tunneling or undermining noted. There is a large amount of purulent drainage noted. Foul odor after cleansing was noted. The wound margin is  distinct with the outline attached to the wound base. There is no granulation within the wound bed. There is a large (67-100%) amount of necrotic tissue within the wound bed including Eschar and Adherent Slough. The periwound skin appearance exhibited: Callus. The periwound skin appearance did not exhibit: Crepitus, Excoriation, Induration, Rash, Scarring, Dry/Scaly, Maceration, Atrophie Blanche, Cyanosis, Ecchymosis, Hemosiderin Staining, Mottled, Pallor, Rubor, Erythema. Periwound temperature was noted as No Abnormality. The  periwound has tenderness on palpation. Assessment Active Problems ICD-10 I70.235 - Atherosclerosis of native arteries of right leg with ulceration of other part of foot Weissmann, Karolynn G. 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 Wound #2 Right Toe - Web between 3rd and 4th: Clean wound with Normal Saline. Wound #4 Right,Dorsal Toe Fifth: Clean wound with Normal Saline. Wound #5 Left,Distal Toe Great: Clean wound with Normal Saline. Wound #6 Left,Lateral Toe Great: Clean wound with Normal Saline. Wound #7 Left Toe Third: Clean wound with Normal Saline. Anesthetic (add to Medication List): Wound #1 Right,Distal Toe - Web between 2nd and 3rd: Topical Lidocaine 4% cream applied to wound bed prior to debridement (In Clinic Only). Wound #2 Right Toe - Web between 3rd and 4th: Topical Lidocaine 4% cream applied to wound bed prior to debridement (In Clinic Only). Wound #4 Right,Dorsal Toe Fifth: Topical Lidocaine 4% cream applied to wound bed prior to debridement (In Clinic Only). Wound #5 Left,Distal Toe Great: Topical Lidocaine 4% cream applied to wound bed prior to debridement (In Clinic Only). Wound #6 Left,Lateral Toe Great: Topical Lidocaine 4% cream applied to wound bed prior to debridement (In Clinic Only). Wound #7 Left Toe Third: Topical Lidocaine 4% cream applied to wound bed prior to debridement (In Clinic Only). Primary Wound Dressing: Wound #1 Right,Distal Toe - Web between 2nd and 3rd: Silvercel Non-Adherent - silvercel between all toes on right foot down to base of web and cover wounds with silvercel strip Wound #2 Right Toe - Web between 3rd and 4th: Silvercel Non-Adherent - silvercel between all toes on  right foot down to base of web and cover wounds with silvercel strip Wound #4 Right,Dorsal Toe Fifth: Silvercel Non-Adherent - silvercel between all toes on right foot down to base of web and cover wounds with silvercel strip Wound #5 Left,Distal Toe Great: Silvercel Non-Adherent Other: - paint with Betadine then place on silvercel Wound #6 Left,Lateral Toe Great: Silvercel Non-Adherent Other: - paint with betadine and then place on silvercel Wound #7 Left Toe Third: Silvercel Non-Adherent Other: - paint with betadine and then place on silvercel Secondary Dressing: Wound #1 Right,Distal Toe - Web between 2nd and 3rd: ABD pad Conform/Kerlix Wound #2 Right Toe - Web between 3rd and 4th: ABD pad Conform/Kerlix Georgiou, Feven G. ([id number]) Wound #4 Right,Dorsal Toe Fifth: ABD pad Conform/Kerlix Wound #5 Left,Distal Toe Great: ABD pad Conform/Kerlix Wound #6 Left,Lateral Toe Great: ABD pad Conform/Kerlix Wound #7 Left Toe Third: ABD pad Conform/Kerlix Dressing Change Frequency: Wound #1 Right,Distal Toe - Web between 2nd and 3rd: Change dressing every day. Wound #2 Right Toe - Web between 3rd and 4th: Change dressing every day. Wound #4 Right,Dorsal Toe Fifth: Change dressing every day. Wound #5 Left,Distal Toe Great: Change dressing every day. Wound #6 Left,Lateral Toe Great: Change dressing every day. Wound #7  Left Toe Third: Change dressing every day. Follow-up Appointments: Wound #1 Right,Distal Toe - Web between 2nd and 3rd: Return Appointment in 1 week. Wound #2 Right Toe - Web between 3rd and 4th: Return Appointment in 1 week. Wound #4 Right,Dorsal Toe Fifth: Return Appointment in 1 week. Wound #5 Left,Distal Toe Great: Return Appointment in 1 week. Wound #6 Left,Lateral Toe Great: Return Appointment in 1 week. Wound #7 Left Toe Third: Return Appointment in 1 week. Additional Orders / Instructions: Wound #1 Right,Distal Toe - Web between 2nd and  3rd: Increase protein intake. Wound #2 Right Toe - Web between 3rd and 4th: Increase protein intake. Wound #4 Right,Dorsal Toe Fifth: Increase protein intake. Wound #5 Left,Distal Toe Great: Increase protein intake. Wound #6 Left,Lateral Toe Great: Increase protein intake. Wound #7 Left Toe Third: Increase protein intake. Home Health: Wound #1 Right,Distal Toe - Web between 2nd and 3rd: Continue Home Health Visits Home Health Nurse may visit PRN to address patient s wound care needs. FACE TO FACE ENCOUNTER: MEDICARE and MEDICAID PATIENTS: I certify that this patient is under my care and that I had a face-to-face encounter that meets the physician face-to-face encounter requirements with this patient on this date. The encounter with the patient was in whole or in part for the following MEDICAL CONDITION: (primary reason for Home Healthcare) MEDICAL NECESSITY: I certify, that based on my findings, NURSING services are a medically necessary home health service. HOME BOUND STATUS: I certify that my clinical findings support that this patient is homebound (i.e., Due to [facility], DILPREET FAIRES. ([id number]) illness or injury, pt requires aid of supportive devices such as crutches, cane, wheelchairs, walkers, the use of special transportation or the assistance of another person to leave their place of residence. There is a normal inability to leave the home and doing so requires considerable and taxing effort. Other absences are for medical reasons / religious services and are infrequent or of short duration when for other reasons). If current dressing causes regression in wound condition, may D/C ordered dressing product/s and apply Normal Saline Moist Dressing daily until next Wound Healing Center / Other MD appointment. Notify Wound Healing Center of regression in wound condition at [phone]. Please direct any NON-WOUND related issues/requests for orders to patient's Primary Care Physician Wound  #2 Right Toe - Web between 3rd and 4th: Continue Home Health Visits Home Health Nurse may visit PRN to address patient s wound care needs. FACE TO FACE ENCOUNTER: MEDICARE and MEDICAID PATIENTS: I certify that this patient is under my care and that I had a face-to-face encounter that meets the physician face-to-face encounter requirements with this patient on this date. The encounter with the patient was in whole or in part for the following MEDICAL CONDITION: (primary reason for Home Healthcare) MEDICAL NECESSITY: I certify, that based on my findings, NURSING services are a medically necessary home health service. HOME BOUND STATUS: I certify that my clinical findings support that this patient is homebound (i.e., Due to illness or injury, pt requires aid of supportive devices such as crutches, cane, wheelchairs, walkers, the use of special transportation or the assistance of another person to leave their place of residence. There is a normal inability to leave the home and doing so requires considerable and taxing effort. Other absences are for medical reasons / religious services and are infrequent or of short duration when for other reasons). If current dressing causes regression in wound condition, may D/C ordered dressing product/s and apply Normal Saline Moist  Dressing daily until next Wound Healing Center / Other MD appointment. Notify Wound Healing Center of regression in wound condition at [phone]. Please direct any NON-WOUND related issues/requests for orders to patient's Primary Care Physician Wound #4 Right,Dorsal Toe Fifth: Continue Home Health Visits Home Health Nurse may visit PRN to address patient s wound care needs. FACE TO FACE ENCOUNTER: MEDICARE and MEDICAID PATIENTS: I certify that this patient is under my care and that I had a face-to-face encounter that meets the physician face-to-face encounter requirements with this patient on this date. The encounter with the  patient was in whole or in part for the following MEDICAL CONDITION: (primary reason for Home Healthcare) MEDICAL NECESSITY: I certify, that based on my findings, NURSING services are a medically necessary home health service. HOME BOUND STATUS: I certify that my clinical findings support that this patient is homebound (i.e., Due to illness or injury, pt requires aid of supportive devices such as crutches, cane, wheelchairs, walkers, the use of special transportation or the assistance of another person to leave their place of residence. There is a normal inability to leave the home and doing so requires considerable and taxing effort. Other absences are for medical reasons / religious services and are infrequent or of short duration when for other reasons). If current dressing causes regression in wound condition, may D/C ordered dressing product/s and apply Normal Saline Moist Dressing daily until next Wound Healing Center / Other MD appointment. Notify Wound Healing Center of regression in wound condition at [phone]. Please direct any NON-WOUND related issues/requests for orders to patient's Primary Care Physician Wound #5 Left,Distal Toe Great: Continue Home Health Visits Home Health Nurse may visit PRN to address patient s wound care needs. FACE TO FACE ENCOUNTER: MEDICARE and MEDICAID PATIENTS: I certify that this patient is under my care and that I had a face-to-face encounter that meets the physician face-to-face encounter requirements with this patient on this date. The encounter with the patient was in whole or in part for the following MEDICAL CONDITION: (primary reason for Home Healthcare) MEDICAL NECESSITY: I certify, that based on my findings, NURSING services are a medically necessary home health service. HOME BOUND STATUS: I certify that my clinical findings support that this patient is homebound (i.e., Due to illness or injury, pt requires aid of supportive devices such as  crutches, cane, wheelchairs, walkers, the use of special transportation or the assistance of another person to leave their place of residence. There is a normal inability to leave the home and doing so requires considerable and taxing effort. Other absences are for medical reasons / religious services and are infrequent or of short duration when for other reasons). If current dressing causes regression in wound condition, may D/C ordered dressing product/s and apply Normal Saline Moist Dressing daily until next Wound Healing Center / Other MD appointment. Notify Wound Healing Center of regression in wound condition at [phone]. Please direct any NON-WOUND related issues/requests for orders to patient's Primary Care Physician Wound #6 Left,Lateral Toe Great: Continue Home Health Visits Home Health Nurse may visit PRN to address patient s wound care needs. FACE TO FACE ENCOUNTER: MEDICARE and MEDICAID PATIENTS: I certify that this patient is under my care and that I Bye, Delle G. ([id number]) had a face-to-face encounter that meets the physician face-to-face encounter requirements with this patient on this date. The encounter with the patient was in whole or in part for the following MEDICAL CONDITION: (primary reason for Home Healthcare) MEDICAL NECESSITY: I  certify, that based on my findings, NURSING services are a medically necessary home health service. HOME BOUND STATUS: I certify that my clinical findings support that this patient is homebound (i.e., Due to illness or injury, pt requires aid of supportive devices such as crutches, cane, wheelchairs, walkers, the use of special transportation or the assistance of another person to leave their place of residence. There is a normal inability to leave the home and doing so requires considerable and taxing effort. Other absences are for medical reasons / religious services and are infrequent or of short duration when for other reasons). If  current dressing causes regression in wound condition, may D/C ordered dressing product/s and apply Normal Saline Moist Dressing daily until next Wound Healing Center / Other MD appointment. Notify Wound Healing Center of regression in wound condition at [phone]. Please direct any NON-WOUND related issues/requests for orders to patient's Primary Care Physician Wound #7 Left Toe Third: Continue Home Health Visits Home Health Nurse may visit PRN to address patient s wound care needs. FACE TO FACE ENCOUNTER: MEDICARE and MEDICAID PATIENTS: I certify that this patient is under my care and that I had a face-to-face encounter that meets the physician face-to-face encounter requirements with this patient on this date. The encounter with the patient was in whole or in part for the following MEDICAL CONDITION: (primary reason for Home Healthcare) MEDICAL NECESSITY: I certify, that based on my findings, NURSING services are a medically necessary home health service. HOME BOUND STATUS: I certify that my clinical findings support that this patient is homebound (i.e., Due to illness or injury, pt requires aid of supportive devices such as crutches, cane, wheelchairs, walkers, the use of special transportation or the assistance of another person to leave their place of residence. There is a normal inability to leave the home and doing so requires considerable and taxing effort. Other absences are for medical reasons / religious services and are infrequent or of short duration when for other reasons). If current dressing causes regression in wound condition, may D/C ordered dressing product/s and apply Normal Saline Moist Dressing daily until next Wound Healing Center / Other MD appointment. Notify Wound Healing Center of regression in wound condition at [phone]. Please direct any NON-WOUND related issues/requests for orders to patient's Primary Care Physician Medications-please add to medication  list.: Wound #6 Left,Lateral Toe Great: P.O. Antibiotics - start antibiotics as prescribed Laboratory ordered were: Wound culture routine - L lateral toe great The following medication(s) was prescribed: lidocaine topical 4 % cream 1 1 cream topical was prescribed at facility doxycycline monohydrate oral 100 mg capsule 1 capsule oral bid for 7 days for cellulitis left great toe starting 08/10/2017 o #1 the patient had a revascularization with a PTCA and drug-eluting stent to the left peroneal artery. There was an unsuccessful attempt at crossing the distal left anterior artery. Unfortunately she has not had much improvement in her pain level. The right great toe was swollen and has an odor. This may be coexistent infection but could be ongoing ischemia. The forefoot is cold and still very painful. The left third toe on however looks about the same dorsal dry gangrene. She can paint these with better diet #2 I cultured the medial part of the left great toe. Empiric doxycycline E scribed Privitera, Jemya G. ([id number]) #3 small necrotic areas on the medial part of the right third and second toes. We are using silver alginate here. Although there was a lot of pain and I couldn't  see anything about the right fifth toe base there was no open area here. I would like to keep this separate from the bottom of the foot [hammertoe] and the lateral part of the fourth toe. Did not appear to be an active arthritis as far as I could tell Electronic Signature(s) Signed: 08/10/2017 4:45:41 PM By: Baltazar Najjar MD Entered By: Baltazar Najjar on 08/10/2017 11:08:45 Christopoulos, [name] ([id number]) -------------------------------------------------------------------------------- SuperBill Details Patient Name: [name] Date of Service: 08/10/2017 Medical Record Number: [id number] Patient Account Number: [id number] Date of Birth/Sex: 1945/07/05 (72 y.o. 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 Atherosclerosis of nonbiological bypass graft(s) of the extremities with gangrene, right leg I70.249 Atherosclerosis of native arteries of left leg with ulceration of unspecified site Facility Procedures CPT4 Code: 78295621 Description: 30865 - WOUND CARE VISIT-LEV 5 EST PT Modifier: Quantity: 1 Physician Procedures CPT4 Code Description: 7846962 99213 - WC PHYS LEVEL 3 - EST PT ICD-10 Diagnosis Description I70.249 Atherosclerosis of native arteries of left leg with ulceration of I70.235 Atherosclerosis of native arteries of right leg with ulceration o Modifier: unspecified site f other part of fo Quantity: 1 ot Electronic Signature(s) Signed: 08/10/2017 4:20:30 PM By: Alejandro Mulling Signed: 08/10/2017 4:45:41 PM By: Baltazar Najjar MD Entered By: Alejandro Mulling on 08/10/2017 16:20:30

## 2017-08-11 NOTE — Progress Notes (Signed)
Cardiology Office Note  Date: 08/12/2017   ID: [name], DOB 31-Oct-1945, MRN [id number]  PCP: Juliette Alcide, MD  Primary Cardiologist: Nona Dell, MD   Chief Complaint  Patient presents with  . Coronary Artery Disease  . Cardiomyopathy    History of Present Illness: [name] is a 72 y.o. female last seen in November 2018.  Interval issues with PAD noted, also follow-up in the Heart Failure clinic in late January.  At that time she was noted to be back in atrial flutter with strategy of heart rate control and anticoagulation pursued at that point.  She has had critical left lower limb ischemia with wounds that have not been healing and underwent successful left peroneal CTO PTA with DES x2 recently for limb salvage.  She is here today with her husband for a follow-up visit.  She is in a wheelchair, both lower legs dressed.  She feels like she is getting better, has less pain in her legs.  She has a visit in the wound clinic and with Dr. Allyson Sabal next week.  She reports no palpitations in persistent atrial flutter, no angina symptoms.  Her weight is down a few pounds.  There has been no significant change in her cardiac regimen.  Past Medical History:  Diagnosis Date  . Asthma   . CAD (coronary artery disease)    Multivessel status post CABG in 2003  . Chronic combined systolic (congestive) and diastolic (congestive) heart failure (HCC) 12/25/2016  . Chronic systolic heart failure (HCC)   . Essential hypertension   . GERD (gastroesophageal reflux disease)   . Gum disease   . History of atrial flutter    Cardioversion 2016 in [place]  . History of colonic polyps   . History of goiter   . Hyperlipidemia   . Hypothyroidism   . Ischemic cardiomyopathy 12/25/2016  . Myocardial infarction (HCC)    2003  . Peripheral arterial disease (HCC)    Stent revascularization 2015 - details not clear  . Renal insufficiency   . Type 2 diabetes mellitus (HCC)     Past  Surgical History:  Procedure Laterality Date  . ABDOMINAL AORTOGRAM W/LOWER EXTREMITY Right 05/30/2017   Procedure: ABDOMINAL AORTOGRAM W/LOWER EXTREMITY;  Surgeon: Runell Gess, MD;  [facility] INVASIVE CV LAB;  Service: Cardiovascular;  Laterality: Right;  . ABDOMINAL AORTOGRAM W/LOWER EXTREMITY N/A 07/14/2017   Procedure: ABDOMINAL AORTOGRAM W/LOWER EXTREMITY;  Surgeon: Fransisco Hertz, MD;  Location: [facility] INVASIVE CV LAB;  Service: Cardiovascular;  Laterality: N/A;  . CARDIAC CATHETERIZATION    . CARDIOVERSION N/A 04/12/2017   Procedure: CARDIOVERSION;  Surgeon: Dolores Patty, MD;  Location: [facility] ENDOSCOPY;  Service: Cardiovascular;  Laterality: N/A;  . CARDIOVERSION N/A 05/02/2017   Procedure: CARDIOVERSION;  Surgeon: Dolores Patty, MD;  Location: [facility] ENDOSCOPY;  Service: Cardiovascular;  Laterality: N/A;  . CHOLECYSTECTOMY     2012  . CORONARY ARTERY BYPASS GRAFT     2003  . LOWER EXTREMITY ANGIOGRAPHY Left 08/01/2017   Procedure: LOWER EXTREMITY ANGIOGRAPHY;  Surgeon: Runell Gess, MD;  Location: [facility] INVASIVE CV LAB;  Service: Cardiovascular;  Laterality: Left;  . PARATHYROIDECTOMY     2015  . PERIPHERAL VASCULAR BALLOON ANGIOPLASTY Left 08/01/2017   Procedure: PERIPHERAL VASCULAR BALLOON ANGIOPLASTY;  Surgeon: Runell Gess, MD;  Location: [facility] INVASIVE CV LAB;  Service: Cardiovascular;  Laterality: Left;  attempted  . PERIPHERAL VASCULAR INTERVENTION Right 05/30/2017  . PERIPHERAL VASCULAR INTERVENTION Right 05/30/2017  Procedure: PERIPHERAL VASCULAR INTERVENTION;  Surgeon: Runell Gess, MD;  Location: [facility] INVASIVE CV LAB;  Service: Cardiovascular;  Laterality: Right;  . PERIPHERAL VASCULAR INTERVENTION Left 08/01/2017   Procedure: PERIPHERAL VASCULAR INTERVENTION;  Surgeon: Runell Gess, MD;  Location: [facility] INVASIVE CV LAB;  Service: Cardiovascular;  Laterality: Left;  Peroneal stent left  . RIGHT/LEFT HEART CATH AND CORONARY/GRAFT ANGIOGRAPHY N/A 04/08/2017    Procedure: RIGHT/LEFT HEART CATH AND CORONARY/GRAFT ANGIOGRAPHY;  Surgeon: Corky Crafts, MD;  Location: [facility] INVASIVE CV LAB;  Service: Cardiovascular;  Laterality: N/A;  . TEE WITHOUT CARDIOVERSION N/A 04/12/2017   Procedure: TRANSESOPHAGEAL ECHOCARDIOGRAM (TEE);  Surgeon: Dolores Patty, MD;  Location: [facility] ENDOSCOPY;  Service: Cardiovascular;  Laterality: N/A;  . THYROIDECTOMY    . TONSILLECTOMY     1967  . ULTRASOUND GUIDANCE FOR VASCULAR ACCESS  04/08/2017   Procedure: Ultrasound Guidance For Vascular Access;  Surgeon: Corky Crafts, MD;  Location: [facility] INVASIVE CV LAB;  Service: Cardiovascular;;  . ULTRASOUND GUIDANCE FOR VASCULAR ACCESS  07/14/2017   Procedure: Ultrasound Guidance For Vascular Access;  Surgeon: Fransisco Hertz, MD;  Location: [facility] INVASIVE CV LAB;  Service: Cardiovascular;;    Current Outpatient Medications  Medication Sig Dispense Refill  . acetaminophen (TYLENOL) 500 MG tablet Take 1,000 mg by mouth daily as needed for moderate pain.     Marland Kitchen albuterol (PROVENTIL HFA;VENTOLIN HFA) 108 (90 Base) MCG/ACT inhaler Inhale 2 puffs into the lungs every 6 (six) hours as needed for wheezing or shortness of breath.     Marland Kitchen amiodarone (PACERONE) 200 MG tablet Take 1 tablet (200 mg total) daily by mouth. 90 tablet 3  . apixaban (ELIQUIS) 5 MG TABS tablet Take 1 tablet (5 mg total) by mouth 2 (two) times daily. 60 tablet 8  . aspirin 81 MG EC tablet Take 1 tablet (81 mg total) by mouth daily.    . carvedilol (COREG) 6.25 MG tablet Take 1 tablet (6.25 mg total) by mouth 2 (two) times daily. 60 tablet 3  . clopidogrel (PLAVIX) 75 MG tablet Take 1 tablet (75 mg total) by mouth daily. 30 tablet 11  . doxycycline (MONODOX) 100 MG capsule TK ONE C PO BID  0  . HYDROcodone-acetaminophen (NORCO) 10-325 MG tablet TK 1 T PO UP TO QID PRN P  0  . levothyroxine (SYNTHROID, LEVOTHROID) 175 MCG tablet Take 175 mcg by mouth daily before breakfast.    . losartan (COZAAR) 25 MG tablet Take  0.5 tablets (12.5 mg total) by mouth daily. 30 tablet 6  . Magnesium 250 MG TABS Take 250 mg by mouth daily.     . magnesium hydroxide (MILK OF MAGNESIA) 400 MG/5ML suspension Take 30 mLs by mouth daily as needed for mild constipation.    . nitroGLYCERIN (NITRODUR - DOSED IN MG/24 HR) 0.2 mg/hr patch PLACE 1 PATCH ONTO THE SKIN DAILY AS NEEDED FOR CHEST PAINS 90 patch 3  . pantoprazole (PROTONIX) 40 MG tablet Take 1 tablet (40 mg total) by mouth daily. 30 tablet 3  . potassium chloride (K-DUR,KLOR-CON) 10 MEQ tablet Take 2 tablets (20 mEq total) by mouth 2 (two) times daily. 120 tablet 6  . promethazine (PHENERGAN) 25 MG tablet Take 25 mg by mouth every 4 (four) hours as needed for nausea/vomiting.  0  . torsemide (DEMADEX) 20 MG tablet Take 2 tablets (40 mg total) by mouth 2 (two) times daily. May also take 1 tablet (20 mg total) as needed (for 3lb weight gain in 24  hours). 150 tablet 6   No current facility-administered medications for this visit.    Allergies:  Oxycodone; Pollen extract; Ranexa [ranolazine]; and Statins   Social History: The patient  reports that she quit smoking about 31 years ago. Her smoking use included cigarettes. She has a 21.00 pack-year smoking history. she has never used smokeless tobacco. She reports that she does not drink alcohol or use drugs.   ROS:  Please see the history of present illness. Otherwise, complete review of systems is positive for improving leg pain, starting home PT.  All other systems are reviewed and negative.   Physical Exam: VS:  BP 105/64   Pulse 85   Ht 5\' 3"  (1.6 m)   Wt 167 lb (75.8 kg) Comment: Pt states  SpO2 95%   BMI 29.58 kg/m , BMI Body mass index is 29.58 kg/m.  Wt Readings from Last 3 Encounters:  08/12/17 167 lb (75.8 kg)  08/02/17 169 lb 12.1 oz (77 kg)  07/29/17 169 lb 12.8 oz (77 kg)    General: Seated in wheelchair, appears comfortable. HEENT: Conjunctiva and lids normal, oropharynx clear. Neck: Supple, no  elevated JVP or carotid bruits, no thyromegaly. Lungs: Clear to auscultation, nonlabored breathing at rest. Cardiac: Irregularly irregular, no S3, soft apical systolic murmur. Abdomen: Soft, nontender, bowel sounds present. Extremities: Legs and feet wrapped distally. Skin: Warm and dry. Musculoskeletal: No kyphosis. Neuropsychiatric: Alert and oriented x3, affect grossly appropriate.  ECG: I personally reviewed the tracing from 07/26/2017 which showed atrial flutter with 2:1 block, borderline low voltage and nonspecific ST changes.  Recent Labwork: 12/24/2016: B Natriuretic Peptide 385.0 04/09/2017: ALT 23; AST 26; TSH 4.502 04/12/2017: Magnesium 2.1 08/02/2017: BUN 18; Creatinine, Ser 1.16; Hemoglobin 9.2; Platelets 304; Potassium 3.7; Sodium 140     Component Value Date/Time   CHOL 153 04/09/2017 0331   TRIG 122 04/09/2017 0331   HDL 31 (L) 04/09/2017 0331   CHOLHDL 4.9 04/09/2017 0331   VLDL 24 04/09/2017 0331   LDLCALC 98 04/09/2017 0331    Other Studies Reviewed Today:  TEE 04/12/2017: Study Conclusions  - Left ventricle: Systolic function was severely reduced. The estimated ejection fraction was in the range of 20% to 25%. Diffuse hypokinesis. No evidence of thrombus. - Aortic valve: No evidence of vegetation. - Mitral valve: No evidence of vegetation. There was mild regurgitation. - Left atrium: The atrium was moderately to severely dilated. No evidence of thrombus in the atrial cavity or appendage. No evidence of thrombus in the atrial cavity or appendage. - Right ventricle: The cavity size was dilated. Systolic function was severely reduced. - Right atrium: The atrium was severely dilated. - Atrial septum: No defect or patent foramen ovale was identified. - Tricuspid valve: No evidence of vegetation. There was moderate regurgitation.  Impressions:  - Successful cardioversion. No cardiac source of emboli was indentified.  Assessment and  Plan:  1.  Chronic combined heart failure with LVEF approximately 20-25% range as of TEE in October 2018.  Suspect mixed picture, ischemic and possibly complicated by atrial flutter.  Plan to continue present regimen including amiodarone and Eliquis.  Once PAD is further stabilized and hopefully she has better wound healing, she could be considered for EP evaluation.  Ablation might be an option.  2.  Multivessel CAD status post CABG in 2003.  She has significant graft disease without clear revascularization options.  No active angina on medical therapy.  3.  Severe PAD, being managed by Dr. Allyson SabalBerry at this point.  She is status post recent left peroneal CTO PTA with DES x2.  She is on aspirin and Plavix.  4.  History of statin intolerance, also declines Zetia.  Current medicines were reviewed with the patient today.   Disposition: Follow-up in the next 6-8 weeks.  Signed, Jonelle Sidle, MD, [facility] 08/12/2017 4:38 PM    [facility] at [facility] [address] [place], [place], [place] [address] Phone: [phone]; Fax: [phone]

## 2017-08-12 ENCOUNTER — Encounter: Payer: Self-pay | Admitting: Cardiology

## 2017-08-12 ENCOUNTER — Ambulatory Visit: Payer: Medicare Other | Admitting: Cardiology

## 2017-08-12 VITALS — BP 105/64 | HR 85 | Ht 63.0 in | Wt 167.0 lb

## 2017-08-12 DIAGNOSIS — I481 Persistent atrial fibrillation: Secondary | ICD-10-CM

## 2017-08-12 DIAGNOSIS — I25119 Atherosclerotic heart disease of native coronary artery with unspecified angina pectoris: Secondary | ICD-10-CM | POA: Diagnosis not present

## 2017-08-12 DIAGNOSIS — Z789 Other specified health status: Secondary | ICD-10-CM

## 2017-08-12 DIAGNOSIS — I739 Peripheral vascular disease, unspecified: Secondary | ICD-10-CM

## 2017-08-12 DIAGNOSIS — I4819 Other persistent atrial fibrillation: Secondary | ICD-10-CM

## 2017-08-12 DIAGNOSIS — I5022 Chronic systolic (congestive) heart failure: Secondary | ICD-10-CM | POA: Diagnosis not present

## 2017-08-12 LAB — AEROBIC CULTURE W GRAM STAIN (SUPERFICIAL SPECIMEN)

## 2017-08-12 LAB — AEROBIC CULTURE  (SUPERFICIAL SPECIMEN)

## 2017-08-12 NOTE — Patient Instructions (Signed)
Medication Instructions:  Continue all current medications.  Labwork: none  Testing/Procedures: none  Follow-Up: 2 months - [place] office   Any Other Special Instructions Will Be Listed Below (If Applicable).  If you need a refill on your cardiac medications before your next appointment, please call your pharmacy.

## 2017-08-14 NOTE — Progress Notes (Signed)
[name] ([id number]) Visit Report for 08/10/2017 Arrival Information Details Patient Name: [name], [name] Date of Service: 08/10/2017 8:45 AM Medical Record Number: [id number] Patient Account Number: [id number] Date of Birth/Sex: 1946/03/11 (72 y.o. Female) Treating RN: Ashok Cordia, Debi Primary Care Rogina Schiano: Quintin Alto Other Clinician: Referring Maicy Filip: Quintin Alto Treating Justus Droke/Extender: Altamese [place] in Treatment: 8 Visit Information History Since Last Visit All ordered tests and consults were completed: No Patient Arrived: Wheel Chair Added or deleted any medications: No Arrival Time: 09:01 Any new allergies or adverse reactions: No Accompanied By: husband Had a fall or experienced change in No Transfer Assistance: EasyPivot Patient activities of daily living that may affect Lift risk of falls: Patient Identification Verified: Yes Signs or symptoms of abuse/neglect since last visito No Secondary Verification Process Yes Hospitalized since last visit: No Completed: Has Dressing in Place as Prescribed: Yes Patient Requires Transmission-Based No Precautions: Pain Present Now: Yes Patient Has Alerts: Yes Patient Alerts: Patient on Blood Thinner Prediabetic Eliquis Electronic Signature(s) Signed: 08/12/2017 4:55:35 PM By: Alejandro Mulling Entered By: Alejandro Mulling on 08/10/2017 09:02:26 [name] ([id number]) -------------------------------------------------------------------------------- Clinic Level of Care Assessment Details Patient Name: [name] Date of Service: 08/10/2017 8:45 AM Medical Record Number: [id number] Patient Account Number: [id number] Date of Birth/Sex: 10-Jan-1946 (72 y.o. Female) Treating RN: Ashok Cordia, Debi Primary Care Kyley Laurel: Quintin Alto Other Clinician: Referring Flossie Wexler: Quintin Alto Treating Kalle Bernath/Extender: Altamese  in Treatment: 8 Clinic Level of Care Assessment  Items TOOL 4 Quantity Score X - Use when only an EandM is performed on FOLLOW-UP visit 1 0 ASSESSMENTS - Nursing Assessment / Reassessment X - Reassessment of Co-morbidities (includes updates in patient status) 1 10 X- 1 5 Reassessment of Adherence to Treatment Plan ASSESSMENTS - Wound and Skin Assessment / Reassessment []  - Simple Wound Assessment / Reassessment - one wound 0 X- 6 5 Complex Wound Assessment / Reassessment - multiple wounds []  - 0 Dermatologic / Skin Assessment (not related to wound area) ASSESSMENTS - Focused Assessment []  - Circumferential Edema Measurements - multi extremities 0 []  - 0 Nutritional Assessment / Counseling / Intervention []  - 0 Lower Extremity Assessment (monofilament, tuning fork, pulses) []  - 0 Peripheral Arterial Disease Assessment (using hand held doppler) ASSESSMENTS - Ostomy and/or Continence Assessment and Care []  - Incontinence Assessment and Management 0 []  - 0 Ostomy Care Assessment and Management (repouching, etc.) PROCESS - Coordination of Care []  - Simple Patient / Family Education for ongoing care 0 X- 1 20 Complex (extensive) Patient / Family Education for ongoing care X- 1 10 Staff obtains Chiropractor, Records, Test Results / Process Orders X- 1 10 Staff telephones HHA, Nursing Homes / Clarify orders / etc []  - 0 Routine Transfer to another Facility (non-emergent condition) []  - 0 Routine Hospital Admission (non-emergent condition) []  - 0 New Admissions / Manufacturing engineer / Ordering NPWT, Apligraf, etc. []  - 0 Emergency Hospital Admission (emergent condition) X- 1 10 Simple Discharge Coordination [name], [name] ([id number]) []  - 0 Complex (extensive) Discharge Coordination PROCESS - Special Needs []  - Pediatric / Minor Patient Management 0 []  - 0 Isolation Patient Management []  - 0 Hearing / Language / Visual special needs []  - 0 Assessment of Community assistance (transportation, D/C planning, etc.) []  -  0 Additional assistance / Altered mentation []  - 0 Support Surface(s) Assessment (bed, cushion, seat, etc.) INTERVENTIONS - Wound Cleansing / Measurement []  - Simple Wound Cleansing - one wound 0 X- 6 5 Complex Wound Cleansing -  multiple wounds X- 1 5 Wound Imaging (photographs - any number of wounds) []  - 0 Wound Tracing (instead of photographs) []  - 0 Simple Wound Measurement - one wound X- 6 5 Complex Wound Measurement - multiple wounds INTERVENTIONS - Wound Dressings []  - Small Wound Dressing one or multiple wounds 0 X- 2 15 Medium Wound Dressing one or multiple wounds []  - 0 Large Wound Dressing one or multiple wounds X- 1 5 Application of Medications - topical []  - 0 Application of Medications - injection INTERVENTIONS - Miscellaneous []  - External ear exam 0 X- 1 5 Specimen Collection (cultures, biopsies, blood, body fluids, etc.) X- 1 5 Specimen(s) / Culture(s) sent or taken to Lab for analysis []  - 0 Patient Transfer (multiple staff / Nurse, adult / Similar devices) []  - 0 Simple Staple / Suture removal (25 or less) []  - 0 Complex Staple / Suture removal (26 or more) []  - 0 Hypo / Hyperglycemic Management (close monitor of Blood Glucose) []  - 0 Ankle / Brachial Index (ABI) - do not check if billed separately X- 1 5 Vital Signs [name], [name] ([id number]) Has the patient been seen at the hospital within the last three years: Yes Total Score: 210 Level Of Care: New/Established - Level 5 Electronic Signature(s) Signed: 08/12/2017 4:55:35 PM By: Alejandro Mulling Entered By: Alejandro Mulling on 08/10/2017 16:20:23 [name] ([id number]) -------------------------------------------------------------------------------- Encounter Discharge Information Details Patient Name: [name] Date of Service: 08/10/2017 8:45 AM Medical Record Number: [id number] Patient Account Number: [id number] Date of Birth/Sex: [birth year] (72 y.o. Female) Treating RN:  Ashok Cordia, Debi Primary Care Isham Smitherman: Quintin Alto Other Clinician: Referring Dandy Lazaro: Quintin Alto Treating Tahjae Durr/Extender: Altamese [place] in Treatment: 8 Encounter Discharge Information Items Discharge Pain Level: 2 Discharge Condition: Stable Ambulatory Status: Wheelchair Discharge Destination: Home Transportation: Private Auto Accompanied By: husband Schedule Follow-up Appointment: Yes Medication Reconciliation completed and No provided to Patient/Care Chevie Birkhead: Provided on Clinical Summary of Care: 08/10/2017 Form Type Recipient Paper Patient MD Electronic Signature(s) Signed: 08/10/2017 4:05:31 PM By: Gwenlyn [name] Entered By: Gwenlyn [name] on 08/10/2017 10:00:57 Cromie, Don [name] ([id number]) -------------------------------------------------------------------------------- General Visit Notes Details Patient Name: [name] Date of Service: 08/10/2017 8:45 AM Medical Record Number: [id number] Patient Account Number: [id number] Date of Birth/Sex: [birth year] (71 y.o. Female) Treating RN: Ashok Cordia, Debi Primary Care Kayvon Mo: Quintin Alto Other Clinician: Izetta Dakin Referring Astrid Vides: Quintin Alto Treating Aradhya Shellenbarger/Extender: Altamese [place] in Treatment: 8 Notes Pts bp was 86/64 on the dinamap and then bp was taken manually and it was 90/62. Electronic Signature(s) Signed: 08/12/2017 4:55:35 PM By: Alejandro Mulling Entered By: Alejandro Mulling on 08/10/2017 10:06:32 Marchiano, Don [name] ([id number]) -------------------------------------------------------------------------------- Lower Extremity Assessment Details Patient Name: [name] Date of Service: 08/10/2017 8:45 AM Medical Record Number: [id number] Patient Account Number: [id number] Date of Birth/Sex: 04/07/1946 (71 y.o. Female) Treating RN: Ashok Cordia, Debi Primary Care Thorsten Climer: Quintin Alto Other Clinician: Referring Zya Finkle: Quintin Alto Treating  Aubery Date/Extender: Altamese [place] in Treatment: 8 Vascular Assessment Pulses: Dorsalis Pedis Palpable: [Left:Yes] [Right:Yes] Posterior Tibial Extremity colors, hair growth, and conditions: Extremity Color: [Left:Normal] [Right:Normal] Temperature of Extremity: [Left:Warm] [Right:Warm] Capillary Refill: [Left:> 3 seconds] [Right:> 3 seconds] Electronic Signature(s) Signed: 08/10/2017 9:22:54 AM By: Alejandro Mulling Entered By: Alejandro Mulling on 08/10/2017 09:22:54 Croswell, Don [name] ([id number]) -------------------------------------------------------------------------------- Multi Wound Chart Details Patient Name: [name] Date of Service: 08/10/2017 8:45 AM Medical Record Number: [id number] Patient Account Number: [id number] Date of Birth/Sex: [birth year] (71 y.o. Female) Treating RN: Ashok Cordia,  Debi Primary Care Talana Slatten: Quintin AltoBURDINE, STEVEN Other Clinician: Referring Rehmat Murtagh: Quintin AltoBURDINE, STEVEN Treating Brette Cast/Extender: Altamese CarolinaOBSON, MICHAEL G Weeks in Treatment: 8 Vital Signs Height(in): 63 Pulse(bpm): 91 Weight(lbs): 176 Blood Pressure(mmHg): 86/64 Body Mass Index(BMI): 31 Temperature(F): 98.4 Respiratory Rate 18 (breaths/min): Photos: [1:No Photos] [2:No Photos] [4:No Photos] Wound Location: [1:Right Toe - Web between 2nd and 3rd - Distal] [2:Right Toe - Web between 3rd and 4th] [4:Right Toe Fifth - Dorsal] Wounding Event: [1:Gradually Appeared] [2:Pressure Injury] [4:Gradually Appeared] Primary Etiology: [1:Arterial Insufficiency Ulcer] [2:Arterial Insufficiency Ulcer] [4:Arterial Insufficiency Ulcer] Comorbid History: [1:Anemia, Arrhythmia, Congestive Heart Failure, Hypertension, Myocardial Infarction, Peripheral Arterial Disease, Neuropathy] [2:Anemia, Arrhythmia, Congestive Heart Failure, Hypertension, Myocardial Infarction, Peripheral Arterial  Disease, Neuropathy] [4:Anemia, Arrhythmia, Congestive Heart Failure, Hypertension, Myocardial Infarction, Peripheral  Arterial Disease, Neuropathy] Date Acquired: [1:05/30/2017] [2:02/21/2017] [4:07/19/2017] Weeks of Treatment: [1:8] [2:7] [4:3] Wound Status: [1:Open] [2:Open] [4:Open] Clustered Wound: [1:Yes] [2:No] [4:No] Pending Amputation on [1:Yes] [2:No] [4:No] Presentation: Measurements L x W x D [1:0.7x0.6x0.1] [2:0.5x0.8x0.1] [4:0.5x0.7x0.1] (cm) Area (cm) : [1:0.33] [2:0.314] [4:0.275] Volume (cm) : [1:0.033] [2:0.031] [4:0.027] % Reduction in Area: [1:98.70%] [2:79.20%] [4:-787.10%] % Reduction in Volume: [1:98.70%] [2:79.50%] [4:-800.00%] Classification: [1:Full Thickness Without Exposed Support Structures] [2:Partial Thickness] [4:Partial Thickness] Exudate Amount: [1:Large] [2:Large] [4:Medium] Exudate Type: [1:Serous] [2:Serous] [4:Serous] Exudate Color: [1:amber] [2:amber] [4:amber] Foul Odor After Cleansing: [1:Yes] [2:Yes] [4:No] Odor Anticipated Due to [1:No] [2:No] [4:N/A] Product Use: Wound Margin: [1:Distinct, outline attached] [2:Distinct, outline attached] [4:Flat and Intact] Granulation Amount: [1:None Present (0%)] [2:None Present (0%)] [4:None Present (0%)] Necrotic Amount: [1:Large (67-100%)] [2:Large (67-100%)] [4:Large (67-100%)] Necrotic Tissue: [1:Adherent Slough] [2:Adherent Slough] [4:Eschar, Adherent Slough] Epithelialization: [1:None] [2:None] [4:None] Periwound Skin Texture: [1:No Abnormalities Noted] [2:No Abnormalities Noted] [4:Excoriation: No Induration: No] Callus: No Crepitus: No Rash: No Scarring: No Periwound Skin Moisture: Maceration: Yes Maceration: Yes Maceration: Yes Dry/Scaly: Yes Periwound Skin Color: No Abnormalities Noted No Abnormalities Noted Atrophie Blanche: No Cyanosis: No Ecchymosis: No Erythema: No Hemosiderin Staining: No Mottled: No Pallor: No Rubor: No Temperature: No Abnormality No Abnormality No Abnormality Tenderness on Palpation: Yes Yes Yes Wound Preparation: Ulcer Cleansing: Ulcer Cleansing: Ulcer  Cleansing: Rinsed/Irrigated with Saline Rinsed/Irrigated with Saline Rinsed/Irrigated with Saline Topical Anesthetic Applied: Topical Anesthetic Applied: Topical Anesthetic Applied: Other: lidocaine 4% Other: lidocaine 4% Other: lidocaine 4% Wound Number: [id number]  Photos: No Photos No Photos No Photos Wound Location: Left Toe Great - Distal Left Toe Great - Lateral Left Toe Third Wounding Event: Gradually Appeared Gradually Appeared Gradually Appeared 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 Comorbid History: Anemia, Arrhythmia, Anemia, Arrhythmia, Anemia, Arrhythmia, Congestive Heart Failure, Congestive Heart Failure, Congestive Heart Failure, Hypertension, Myocardial Hypertension, Myocardial Hypertension, Myocardial Infarction, Peripheral Arterial Infarction, Peripheral Arterial Infarction, Peripheral Arterial Disease, Neuropathy Disease, Neuropathy Disease, Neuropathy Date Acquired: 07/17/2017 07/17/2017 07/16/2017 Weeks of Treatment: 3 3 3  Wound Status: Open Open Open Clustered Wound: No No No Pending Amputation on No No No Presentation: Measurements L x W x D 4x2x0.1 1.6x1x0.1 2.5x1.5x0.1 (cm) Area (cm) : 6.283 1.257 2.945 Volume (cm) : 0.628 0.126 0.295 % Reduction in Area: -299.90% 59.60% -290.60% % Reduction in Volume: -3825.00% 59.50% -293.30% Classification: Partial Thickness Partial Thickness Partial Thickness Exudate Amount: Large Large Large Exudate Type: Serous Purulent Purulent Exudate Color: amber yellow, brown, green yellow, brown, green Foul Odor After Cleansing: Yes Yes Yes Odor Anticipated Due to No No No Product Use: Wound Margin: Flat and Intact Distinct, outline attached Distinct, outline attached Granulation Amount: None Present (0%) None Present (0%) None  Present (0%) Necrotic Amount: Large (67-100%) Large (67-100%) Large (67-100%) Necrotic Tissue: Eschar, Adherent Slough Eschar, Adherent  Slough Eschar, Adherent Slough Exposed Structures: Fascia: No Fascia: No Fascia: No Fat Layer (Subcutaneous Fat Layer (Subcutaneous Fat Layer (Subcutaneous Trent, Sabrina G. ([id number]) Tissue) Exposed: No Tissue) Exposed: No Tissue) Exposed: No Tendon: No Tendon: No Tendon: No Muscle: No Muscle: No Muscle: No Joint: No Joint: No Joint: No Bone: No Bone: No Bone: No Epithelialization: None None None Periwound Skin Texture: Induration: Yes Induration: Yes Callus: Yes Callus: Yes Callus: Yes Excoriation: No Scarring: Yes Crepitus: Yes Induration: No Excoriation: No Excoriation: No Crepitus: No Crepitus: No Rash: No Rash: No Rash: No Scarring: No Scarring: No Periwound Skin Moisture: Dry/Scaly: Yes Maceration: Yes Maceration: No Maceration: No Dry/Scaly: No Dry/Scaly: No Periwound Skin Color: Atrophie Blanche: No Atrophie Blanche: No Atrophie Blanche: No Cyanosis: No Cyanosis: No Cyanosis: No Ecchymosis: No Ecchymosis: No Ecchymosis: No Erythema: No Erythema: No Erythema: No Hemosiderin Staining: No Hemosiderin Staining: No Hemosiderin Staining: No Mottled: No Mottled: No Mottled: No Pallor: No Pallor: No Pallor: No Rubor: No Rubor: No Rubor: No Temperature: No Abnormality No Abnormality No Abnormality Tenderness on Palpation: Yes Yes Yes Wound Preparation: Ulcer Cleansing: Ulcer Cleansing: Ulcer Cleansing: Rinsed/Irrigated with Saline Rinsed/Irrigated with Saline Rinsed/Irrigated with Saline Topical Anesthetic Applied: Topical Anesthetic Applied: Topical Anesthetic Applied: Other: lidocaine 4% Other: lidocaine 4% Other: lidocaine 4% Treatment Notes Electronic Signature(s) Signed: 08/10/2017 4:45:41 PM By: Baltazar Najjar MD Entered By: Baltazar Najjar on 08/10/2017 10:59:11 Steel, Don [name] ([id number]) -------------------------------------------------------------------------------- Multi-Disciplinary Care Plan Details Patient  Name: [name] Date of Service: 08/10/2017 8:45 AM Medical Record Number: [id number] Patient Account Number: [id number] Date of Birth/Sex: 05-14-1946 (71 y.o. Female) Treating RN: Ashok Cordia, Debi Primary Care Levenia Skalicky: Quintin Alto Other Clinician: Referring Garrell Flagg: Quintin Alto Treating Gavinn Collard/Extender: Altamese [place] in Treatment: 8 Active Inactive ` Abuse / Safety / Falls / Self Care Management Nursing Diagnoses: History of Falls Impaired home maintenance Potential for falls Goals: Patient will not experience any injury related to falls Date Initiated: 06/13/2017 Target Resolution Date: 07/14/2017 Goal Status: Active Patient will remain injury free related to falls Date Initiated: 06/13/2017 Target Resolution Date: 07/14/2017 Goal Status: Active Interventions: Assess fall risk on admission and as needed Treatment Activities: Patient referred to home care : 06/13/2017 Notes: ` Necrotic Tissue Nursing Diagnoses: Impaired tissue integrity related to necrotic/devitalized tissue Goals: Necrotic/devitalized tissue will be minimized in the wound bed Date Initiated: 06/13/2017 Target Resolution Date: 07/14/2017 Goal Status: Active Interventions: Assess patient pain level pre-, during and post procedure and prior to discharge Provide education on necrotic tissue and debridement process Treatment Activities: Apply topical anesthetic as ordered : 06/13/2017 Notes: Kaestner, Don [name] ([id number]) ` Orientation to the Wound Care Program Nursing Diagnoses: Knowledge deficit related to the wound healing center program Goals: Patient/caregiver will verbalize understanding of the Wound Healing Center Program Date Initiated: 06/13/2017 Target Resolution Date: 07/14/2017 Goal Status: Active Interventions: Provide education on orientation to the wound center Notes: ` Wound/Skin Impairment Nursing Diagnoses: Impaired tissue integrity Knowledge deficit  related to ulceration/compromised skin integrity Goals: Ulcer/skin breakdown will have a volume reduction of 30% by week 4 Date Initiated: 06/13/2017 Target Resolution Date: 07/14/2017 Goal Status: Active Interventions: Assess ulceration(s) every visit Treatment Activities: Patient referred to home care : 06/13/2017 Topical wound management initiated : 06/13/2017 Notes: Electronic Signature(s) Signed: 08/12/2017 4:55:35 PM By: Alejandro Mulling Entered By: Alejandro Mulling on 08/10/2017 09:33:50 Holsopple, Don [name] ([id number]) -------------------------------------------------------------------------------- Pain Assessment Details Patient  Name: Jasinski, Don [name] Date of Service: 08/10/2017 8:45 AM Medical Record Number: [id number] Patient Account Number: [id number] Date of Birth/Sex: [birth year] (72 y.o. Female) Treating RN: Ashok Cordia, Debi Primary Care Azariyah Luhrs: Quintin Alto Other Clinician: Referring Elga Santy: Quintin Alto Treating Tiyanna Larcom/Extender: Altamese [place] in Treatment: 8 Active Problems Location of Pain Severity and Description of Pain Patient Has Paino Yes Site Locations Rate the pain. Current Pain Level: 2 Character of Pain Describe the Pain: Aching Pain Management and Medication Current Pain Management: Notes Topical or injectable lidocaine is offered to patient for acute pain when surgical debridement is performed. If needed, Patient is instructed to use over the counter pain medication for the following 24-48 hours after debridement. Wound care MDs do not prescribed pain medications. Patient has chronic pain or uncontrolled pain. Patient has been instructed to make an appointment with their Primary Care Physician for pain management. Electronic Signature(s) Signed: 08/12/2017 4:55:35 PM By: Alejandro Mulling Entered By: Alejandro Mulling on 08/10/2017 09:03:07 Gignac, Don [name]  ([id number]) -------------------------------------------------------------------------------- Patient/Caregiver Education Details Patient Name: [name] Date of Service: 08/10/2017 8:45 AM Medical Record Number: [id number] Patient Account Number: [id number] Date of Birth/Gender: 06/13/1946 (71 y.o. Female) Treating RN: Ashok Cordia, Debi Primary Care Physician: Quintin Alto Other Clinician: Referring Physician: Quintin Alto Treating Physician/Extender: Altamese [place] in Treatment: 8 Education Assessment Education Provided To: Patient Education Topics Provided Wound/Skin Impairment: Handouts: Caring for Your Ulcer, Other: change dressing as ordered Methods: Demonstration, Explain/Verbal Responses: State content correctly Electronic Signature(s) Signed: 08/12/2017 4:55:35 PM By: Alejandro Mulling Entered By: Alejandro Mulling on 08/10/2017 09:33:40 Escamilla, Don [name] ([id number]) -------------------------------------------------------------------------------- Wound Assessment Details Patient Name: [name] Date of Service: 08/10/2017 8:45 AM Medical Record Number: [id number] Patient Account Number: [id number] Date of Birth/Sex: 05/30/1946 (71 y.o. Female) Treating RN: Ashok Cordia, Debi Primary Care Gerlad Pelzel: Quintin Alto Other Clinician: Referring Doniqua Saxby: Quintin Alto Treating Luay Balding/Extender: Altamese [place] in Treatment: 8 Wound Status Wound Number: 1 Primary Arterial Insufficiency Ulcer Etiology: Wound Location: Right Toe - Web between 2nd and 3rd - Distal Wound Open Status: Wounding Event: Gradually Appeared Comorbid Anemia, Arrhythmia, Congestive Heart Failure, Date Acquired: 05/30/2017 History: Hypertension, Myocardial Infarction, Peripheral Weeks Of Treatment: 8 Arterial Disease, Neuropathy Clustered Wound: Yes Pending Amputation On Presentation Photos Photo Uploaded By: Alejandro Mulling on 08/10/2017 13:34:28 Wound  Measurements Length: (cm) 0.7 Width: (cm) 0.6 Depth: (cm) 0.1 Area: (cm) 0.33 Volume: (cm) 0.033 % Reduction in Area: 98.7% % Reduction in Volume: 98.7% Epithelialization: None Tunneling: No Undermining: No Wound Description Full Thickness Without Exposed Support Classification: Structures Wound Margin: Distinct, outline attached Exudate Large Amount: Exudate Type: Serous Exudate Color: amber Foul Odor After Cleansing: Yes Due to Product Use: No Slough/Fibrino Yes Wound Bed Granulation Amount: None Present (0%) Necrotic Amount: Large (67-100%) Necrotic Quality: Adherent Slough Periwound Skin Texture Texture Color Bjorn, Nydia G. ([id number]) No Abnormalities Noted: No No Abnormalities Noted: No Moisture Temperature / Pain No Abnormalities Noted: No Temperature: No Abnormality Maceration: Yes Tenderness on Palpation: Yes Wound Preparation Ulcer Cleansing: Rinsed/Irrigated with Saline Topical Anesthetic Applied: Other: lidocaine 4%, Treatment Notes Wound #1 (Right, Distal Toe - Web between 2nd and 3rd) 1. Cleansed with: Clean wound with Normal Saline 2. Anesthetic Topical Lidocaine 4% cream to wound bed prior to debridement 4. Dressing Applied: Other dressing (specify in notes) 5. Secondary Dressing Applied ABD Pad Kerlix/Conform 7. Secured with Tape Notes silvercel, iodine Electronic Signature(s) Signed: 08/12/2017 4:55:35 PM By: Alejandro Mulling Entered By: Alejandro Mulling on 08/10/2017 09:15:02 Torregrossa, Corrie Dandy  Reece Agar ([id number]) -------------------------------------------------------------------------------- Wound Assessment Details Patient Name: SAKOYA, WIN Date of Service: 08/10/2017 8:45 AM Medical Record Number: [id number] Patient Account Number: [id number] Date of Birth/Sex: [birth year] (72 y.o. Female) Treating RN: Ashok Cordia, Debi Primary Care Kynzie Polgar: Quintin Alto Other Clinician: Referring Shey Yott: Quintin Alto Treating  Merari Pion/Extender: Altamese [place] in Treatment: 8 Wound Status Wound Number: 2 Primary Arterial Insufficiency Ulcer Etiology: Wound Location: Right Toe - Web between 3rd and 4th Wound Open Wounding Event: Pressure Injury Status: Date Acquired: 02/21/2017 Comorbid Anemia, Arrhythmia, Congestive Heart Failure, Weeks Of Treatment: 7 History: Hypertension, Myocardial Infarction, Peripheral Clustered Wound: No Arterial Disease, Neuropathy Photos Photo Uploaded By: Alejandro Mulling on 08/10/2017 13:34:59 Wound Measurements Length: (cm) 0.5 Width: (cm) 0.8 Depth: (cm) 0.1 Area: (cm) 0.314 Volume: (cm) 0.031 % Reduction in Area: 79.2% % Reduction in Volume: 79.5% Epithelialization: None Tunneling: No Undermining: No Wound Description Classification: Partial Thickness Wound Margin: Distinct, outline attached Exudate Amount: Large Exudate Type: Serous Exudate Color: amber Foul Odor After Cleansing: Yes Due to Product Use: No Slough/Fibrino Yes Wound Bed Granulation Amount: None Present (0%) Necrotic Amount: Large (67-100%) Necrotic Quality: Adherent Slough Periwound Skin Texture Texture Color No Abnormalities Noted: No No Abnormalities Noted: No Moisture Temperature / Pain No Abnormalities Noted: No Temperature: No Abnormality [place], [place] G. ([id number]) Maceration: Yes Tenderness on Palpation: Yes Wound Preparation Ulcer Cleansing: Rinsed/Irrigated with Saline Topical Anesthetic Applied: Other: lidocaine 4%, Treatment Notes Wound #2 (Right Toe - Web between 3rd and 4th) 1. Cleansed with: Clean wound with Normal Saline 2. Anesthetic Topical Lidocaine 4% cream to wound bed prior to debridement 4. Dressing Applied: Other dressing (specify in notes) 5. Secondary Dressing Applied ABD Pad Kerlix/Conform 7. Secured with Tape Notes silvercel, iodine Electronic Signature(s) Signed: 08/12/2017 4:55:35 PM By: Alejandro Mulling Entered By: Alejandro Mulling on 08/10/2017 09:15:42 Metzer, Don [name] ([id number]) -------------------------------------------------------------------------------- Wound Assessment Details Patient Name: [name] Date of Service: 08/10/2017 8:45 AM Medical Record Number: [id number] Patient Account Number: [id number] Date of Birth/Sex: [birth year] (72 y.o. Female) Treating RN: Ashok Cordia, Debi Primary Care Hartleigh Edmonston: Quintin Alto Other Clinician: Referring Prophet Renwick: Quintin Alto Treating Devika Dragovich/Extender: Altamese [place] in Treatment: 8 Wound Status Wound Number: 4 Primary Arterial Insufficiency Ulcer Etiology: Wound Location: Right Toe Fifth - Dorsal Wound Open Wounding Event: Gradually Appeared Status: Date Acquired: 07/19/2017 Comorbid Anemia, Arrhythmia, Congestive Heart Failure, Weeks Of Treatment: 3 History: Hypertension, Myocardial Infarction, Peripheral Clustered Wound: No Arterial Disease, Neuropathy Photos Photo Uploaded By: Alejandro Mulling on 08/10/2017 13:34:59 Wound Measurements Length: (cm) 0.5 Width: (cm) 0.7 Depth: (cm) 0.1 Area: (cm) 0.275 Volume: (cm) 0.027 % Reduction in Area: -787.1% % Reduction in Volume: -800% Epithelialization: None Tunneling: No Undermining: No Wound Description Classification: Partial Thickness Wound Margin: Flat and Intact Exudate Amount: Medium Exudate Type: Serous Exudate Color: amber Foul Odor After Cleansing: No Slough/Fibrino Yes Wound Bed Granulation Amount: None Present (0%) Exposed Structure Necrotic Amount: Large (67-100%) Fascia Exposed: No Necrotic Quality: Eschar, Adherent Slough Fat Layer (Subcutaneous Tissue) Exposed: No Tendon Exposed: No Muscle Exposed: No Joint Exposed: No Bone Exposed: No Periwound Skin Texture Mcmanaman, Brandilynn G. ([id number]) Texture Color No Abnormalities Noted: No No Abnormalities Noted: No Callus: No Atrophie Blanche: No Crepitus: No Cyanosis: No Excoriation: No Ecchymosis:  No Induration: No Erythema: No Rash: No Hemosiderin Staining: No Scarring: No Mottled: No Pallor: No Moisture Rubor: No No Abnormalities Noted: No Dry / Scaly: Yes Temperature / Pain Maceration: Yes Temperature: No Abnormality Tenderness on Palpation: Yes Wound Preparation Ulcer Cleansing:  Rinsed/Irrigated with Saline Topical Anesthetic Applied: Other: lidocaine 4%, Treatment Notes Wound #4 (Right, Dorsal Toe Fifth) 1. Cleansed with: Clean wound with Normal Saline 2. Anesthetic Topical Lidocaine 4% cream to wound bed prior to debridement 4. Dressing Applied: Other dressing (specify in notes) 5. Secondary Dressing Applied ABD Pad Kerlix/Conform 7. Secured with Tape Notes silvercel, iodine Electronic Signature(s) Signed: 08/12/2017 4:55:35 PM By: Alejandro Mulling Entered By: Alejandro Mulling on 08/10/2017 09:17:11 Salata, Don [name] ([id number]) -------------------------------------------------------------------------------- Wound Assessment Details Patient Name: [name] Date of Service: 08/10/2017 8:45 AM Medical Record Number: [id number] Patient Account Number: [id number] Date of Birth/Sex: 1945/12/25 (72 y.o. Female) Treating RN: Ashok Cordia, Debi Primary Care Avneet Ashmore: Quintin Alto Other Clinician: Referring Jagger Demonte: Quintin Alto Treating Joriel Streety/Extender: Altamese [place] in Treatment: 8 Wound Status Wound Number: 5 Primary Arterial Insufficiency Ulcer Etiology: Wound Location: Left Toe Great - Distal Wound Open Wounding Event: Gradually Appeared Status: Date Acquired: 07/17/2017 Comorbid Anemia, Arrhythmia, Congestive Heart Failure, Weeks Of Treatment: 3 History: Hypertension, Myocardial Infarction, Peripheral Clustered Wound: No Arterial Disease, Neuropathy Photos Photo Uploaded By: Alejandro Mulling on 08/10/2017 13:35:34 Wound Measurements Length: (cm) 4 Width: (cm) 2 Depth: (cm) 0.1 Area: (cm) 6.283 Volume: (cm) 0.628 %  Reduction in Area: -299.9% % Reduction in Volume: -3825% Epithelialization: None Tunneling: No Undermining: No Wound Description Classification: Partial Thickness Wound Margin: Flat and Intact Exudate Amount: Large Exudate Type: Serous Exudate Color: amber Foul Odor After Cleansing: Yes Due to Product Use: No Slough/Fibrino Yes Wound Bed Granulation Amount: None Present (0%) Exposed Structure Necrotic Amount: Large (67-100%) Fascia Exposed: No Necrotic Quality: Eschar, Adherent Slough Fat Layer (Subcutaneous Tissue) Exposed: No Tendon Exposed: No Muscle Exposed: No Joint Exposed: No Bone Exposed: No Periwound Skin Texture Galvis, Kitara G. ([id number]) Texture Color No Abnormalities Noted: No No Abnormalities Noted: No Callus: Yes Atrophie Blanche: No Crepitus: No Cyanosis: No Excoriation: No Ecchymosis: No Induration: Yes Erythema: No Rash: No Hemosiderin Staining: No Scarring: Yes Mottled: No Pallor: No Moisture Rubor: No No Abnormalities Noted: No Dry / Scaly: Yes Temperature / Pain Maceration: No Temperature: No Abnormality Tenderness on Palpation: Yes Wound Preparation Ulcer Cleansing: Rinsed/Irrigated with Saline Topical Anesthetic Applied: Other: lidocaine 4%, Treatment Notes Wound #5 (Left, Distal Toe Great) 1. Cleansed with: Clean wound with Normal Saline 2. Anesthetic Topical Lidocaine 4% cream to wound bed prior to debridement 4. Dressing Applied: Other dressing (specify in notes) 5. Secondary Dressing Applied ABD Pad Kerlix/Conform 7. Secured with Tape Notes silvercel, iodine Electronic Signature(s) Signed: 08/12/2017 4:55:35 PM By: Alejandro Mulling Entered By: Alejandro Mulling on 08/10/2017 09:18:01 Shepard, Don [name] ([id number]) -------------------------------------------------------------------------------- Wound Assessment Details Patient Name: [name] Date of Service: 08/10/2017 8:45 AM Medical Record Number:  [id number] Patient Account Number: [id number] Date of Birth/Sex: October 22, 1945 (72 y.o. Female) Treating RN: Ashok Cordia, Debi Primary Care Lavana Huckeba: Quintin Alto Other Clinician: Referring Quadre Bristol: Quintin Alto Treating Khary Schaben/Extender: Altamese [place] in Treatment: 8 Wound Status Wound Number: 6 Primary Arterial Insufficiency Ulcer Etiology: Wound Location: Left Toe Great - Lateral Wound Open Wounding Event: Gradually Appeared Status: Date Acquired: 07/17/2017 Comorbid Anemia, Arrhythmia, Congestive Heart Failure, Weeks Of Treatment: 3 History: Hypertension, Myocardial Infarction, Peripheral Clustered Wound: No Arterial Disease, Neuropathy Photos Photo Uploaded By: Alejandro Mulling on 08/10/2017 13:35:35 Wound Measurements Length: (cm) 1.6 Width: (cm) 1 Depth: (cm) 0.1 Area: (cm) 1.257 Volume: (cm) 0.126 % Reduction in Area: 59.6% % Reduction in Volume: 59.5% Epithelialization: None Tunneling: No Undermining: No Wound Description Classification: Partial Thickness Wound Margin: Distinct, outline attached  Exudate Amount: Large Exudate Type: Purulent Exudate Color: yellow, brown, green Foul Odor After Cleansing: Yes Due to Product Use: No Slough/Fibrino Yes Wound Bed Granulation Amount: None Present (0%) Exposed Structure Necrotic Amount: Large (67-100%) Fascia Exposed: No Necrotic Quality: Eschar, Adherent Slough Fat Layer (Subcutaneous Tissue) Exposed: No Tendon Exposed: No Muscle Exposed: No Joint Exposed: No Bone Exposed: No Periwound Skin Texture Barfoot, Neshia G. ([id number]) Texture Color No Abnormalities Noted: No No Abnormalities Noted: No Callus: Yes Atrophie Blanche: No Crepitus: Yes Cyanosis: No Excoriation: No Ecchymosis: No Induration: Yes Erythema: No Rash: No Hemosiderin Staining: No Scarring: No Mottled: No Pallor: No Moisture Rubor: No No Abnormalities Noted: No Dry / Scaly: No Temperature / Pain Maceration:  Yes Temperature: No Abnormality Tenderness on Palpation: Yes Wound Preparation Ulcer Cleansing: Rinsed/Irrigated with Saline Topical Anesthetic Applied: Other: lidocaine 4%, Treatment Notes Wound #6 (Left, Lateral Toe Great) 1. Cleansed with: Clean wound with Normal Saline 2. Anesthetic Topical Lidocaine 4% cream to wound bed prior to debridement 4. Dressing Applied: Other dressing (specify in notes) 5. Secondary Dressing Applied ABD Pad Kerlix/Conform 7. Secured with Tape 9. Other Orders Culture obtained per physician order Notes silvercel, iodine Electronic Signature(s) Signed: 08/12/2017 4:55:35 PM By: Alejandro Mulling Entered By: Alejandro Mulling on 08/10/2017 09:18:41 Dols, Don [name] ([id number]) -------------------------------------------------------------------------------- Wound Assessment Details Patient Name: [name] Date of Service: 08/10/2017 8:45 AM Medical Record Number: [id number] Patient Account Number: [id number] Date of Birth/Sex: 25-Sep-1945 (71 y.o. Female) Treating RN: Ashok Cordia, Debi Primary Care Amar Keenum: Quintin Alto Other Clinician: Referring Shawnice Tilmon: Quintin Alto Treating Santrice Muzio/Extender: Altamese [place] in Treatment: 8 Wound Status Wound Number: 7 Primary Arterial Insufficiency Ulcer Etiology: Wound Location: Left Toe Third Wound Open Wounding Event: Gradually Appeared Status: Date Acquired: 07/16/2017 Comorbid Anemia, Arrhythmia, Congestive Heart Failure, Weeks Of Treatment: 3 History: Hypertension, Myocardial Infarction, Peripheral Clustered Wound: No Arterial Disease, Neuropathy Photos Photo Uploaded By: Alejandro Mulling on 08/10/2017 13:35:53 Wound Measurements Length: (cm) 2.5 Width: (cm) 1.5 Depth: (cm) 0.1 Area: (cm) 2.945 Volume: (cm) 0.295 % Reduction in Area: -290.6% % Reduction in Volume: -293.3% Epithelialization: None Tunneling: No Undermining: No Wound Description Classification:  Partial Thickness Wound Margin: Distinct, outline attached Exudate Amount: Large Exudate Type: Purulent Exudate Color: yellow, brown, green Foul Odor After Cleansing: Yes Due to Product Use: No Slough/Fibrino Yes Wound Bed Granulation Amount: None Present (0%) Exposed Structure Necrotic Amount: Large (67-100%) Fascia Exposed: No Necrotic Quality: Eschar, Adherent Slough Fat Layer (Subcutaneous Tissue) Exposed: No Tendon Exposed: No Muscle Exposed: No Joint Exposed: No Bone Exposed: No Periwound Skin Texture Metallo, Ketara G. ([id number]) Texture Color No Abnormalities Noted: No No Abnormalities Noted: No Callus: Yes Atrophie Blanche: No Crepitus: No Cyanosis: No Excoriation: No Ecchymosis: No Induration: No Erythema: No Rash: No Hemosiderin Staining: No Scarring: No Mottled: No Pallor: No Moisture Rubor: No No Abnormalities Noted: No Dry / Scaly: No Temperature / Pain Maceration: No Temperature: No Abnormality Tenderness on Palpation: Yes Wound Preparation Ulcer Cleansing: Rinsed/Irrigated with Saline Topical Anesthetic Applied: Other: lidocaine 4%, Treatment Notes Wound #7 (Left Toe Third) 1. Cleansed with: Clean wound with Normal Saline 2. Anesthetic Topical Lidocaine 4% cream to wound bed prior to debridement 4. Dressing Applied: Other dressing (specify in notes) 5. Secondary Dressing Applied ABD Pad Kerlix/Conform 7. Secured with Tape Notes silvercel, iodine Electronic Signature(s) Signed: 08/12/2017 4:55:35 PM By: Alejandro Mulling Entered By: Alejandro Mulling on 08/10/2017 09:19:18 Bark, Don [name] ([id number]) -------------------------------------------------------------------------------- Vitals Details Patient Name: [name] Date of Service: 08/10/2017 8:45  AM Medical Record Number: [id number] Patient Account Number: [id number] Date of Birth/Sex: 1945-07-09 (72 y.o. Female) Treating RN: Ashok Cordia, Debi Primary Care Briany Aye: Quintin Alto Other Clinician: Referring Daanya Lanphier: Quintin Alto Treating Samara Stankowski/Extender: Altamese [place] in Treatment: 8 Vital Signs Time Taken: 09:03 Temperature (F): 98.4 Height (in): 63 Pulse (bpm): 91 Weight (lbs): 176 Respiratory Rate (breaths/min): 18 Body Mass Index (BMI): 31.2 Blood Pressure (mmHg): 86/64 Reference Range: 80 - 120 mg / dl Notes Pts bp was 40/98 on the dinamap and then bp was taken manually and it was 90/62. Electronic Signature(s) Signed: 08/12/2017 4:55:35 PM By: Alejandro Mulling Entered By: Alejandro Mulling on 08/10/2017 09:09:56

## 2017-08-15 ENCOUNTER — Inpatient Hospital Stay (HOSPITAL_COMMUNITY): Admission: RE | Admit: 2017-08-15 | Payer: Medicare Other | Source: Ambulatory Visit

## 2017-08-16 ENCOUNTER — Ambulatory Visit: Payer: Medicare Other | Admitting: Cardiovascular Disease

## 2017-08-16 ENCOUNTER — Encounter: Payer: Self-pay | Admitting: Cardiovascular Disease

## 2017-08-16 DIAGNOSIS — I998 Other disorder of circulatory system: Secondary | ICD-10-CM | POA: Diagnosis not present

## 2017-08-16 DIAGNOSIS — I70229 Atherosclerosis of native arteries of extremities with rest pain, unspecified extremity: Secondary | ICD-10-CM

## 2017-08-16 NOTE — Assessment & Plan Note (Signed)
Ms. Pricilla HolmDeFalco returns today for close outpatient follow-up. 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 Recent Doppler show patent peroneal stent. She says that she thinks that her toes are slowly healing. She is followed by Dr. Leanord Hawkingobson closely as an outpatient.

## 2017-08-16 NOTE — Progress Notes (Signed)
Ms. Pricilla HolmDeFalco returns today for close outpatient follow-up. 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 Recent Doppler show patent peroneal stent. She says that she thinks that her toes are slowly healing. She is followed by Dr. Leanord Hawkingobson closely as an outpatient.  Runell GessJonathan J. Aneisa Karren, M.D., FACP, [facility] County HospitalFACC, Earl LagosFAHA, Phys[facility] Surgery CtrFSCAI [facility] [address] [place], [place]  [id number]  [phone] 08/16/2017 11:52 AM

## 2017-08-16 NOTE — Patient Instructions (Signed)
Medication Instructions: Your physician recommends that you continue on your current medications as directed. Please refer to the Current Medication list given to you today.   Follow-Up: Your physician recommends that you schedule a follow-up appointment in: 4-6 weeks with Dr. Allyson SabalBerry.

## 2017-08-17 ENCOUNTER — Ambulatory Visit: Payer: Medicare Other | Admitting: Cardiovascular Disease

## 2017-08-17 ENCOUNTER — Ambulatory Visit: Payer: Medicare Other | Admitting: Vascular Surgery

## 2017-08-17 ENCOUNTER — Encounter: Payer: Medicare Other | Admitting: Internal Medicine

## 2017-08-17 DIAGNOSIS — E11621 Type 2 diabetes mellitus with foot ulcer: Secondary | ICD-10-CM | POA: Diagnosis not present

## 2017-08-18 NOTE — Progress Notes (Signed)
TONANTZIN, MIMNAUGH ([id number]) Visit Report for 08/17/2017 HPI Details Patient Name: [name], [name] Date of Service: 08/17/2017 2:00 PM Medical Record Number: [id number] Patient Account Number: [id number] Date of Birth/Sex: 23-May-1946 (72 y.o. Female) Treating RN: Huel Coventry Primary Care Provider: Quintin Alto Other Clinician: Referring Provider: Quintin Alto Treating Provider/Extender: Altamese Whittlesey in Treatment: 9 History of Present Illness Location: right foot toes Quality: Patient reports experiencing a sharp pain to affected area(s). Severity: Patient states wound are getting worse. Duration: Patient has had the wound for < 2 weeks prior to presenting for treatment Timing: Pain in wound is constant (hurts all the time) Context: The wound appeared gradually over time Modifying Factors: Other treatment(s) tried include:recent interventional process to place a stent in her right lower extremity for critical limb ischemia Associated Signs and Symptoms: Patient reports having foul odor. HPI Description: 72 year old female who was referred by Dr. Nanetta Batty, who performed a endovascular procedure on 05/26/2017, for critical limb ischemia. Her angiogram showed a 99% below the knee popliteal stenosis which is stented with a 4 mm drug eluting stent. On 06/10/2017 he noted that her pulses are palpable and her Dopplers have normalized. He referred her to wound care for further advice and put her on Keflex 500 mg twice daily for 10 days. She has a past medical history of asthma, coronary artery disease, CHF, hyperlipidemia, ischemic cardiomyopathy, peripheral arterial disease and type 2 diabetes mellitus. She is status post recent abdominal aortogram with right lower extremity stenting, cholecystectomy, CABG, parathyroidectomy, thyroidectomy. She is a former smoker and quit in 1988. She had an ABI performed on 06/10/2017 with the right ABI within normal range and the left ABI  shows mild lower extremity arterial disease. The right ABI was 1.0 and biphasic and the left ABI was 0.93 and biphasic. Toe pressures were abnormal with the right being 0.27 and left being 0.31. the duplex study done showed a patent right popliteal artery stent without evidence of focal stenosis or obstruction. of note the patient started having signs of critical limb ischemia somewhere at the end of November and after an urgent workup was taken up for limb salvage with a successfully placed stent by Dr. Nanetta Batty, with good arterial perfusion postprocedure with normal ABIs noted on 06/10/2017. 06/22/17; patient was admitted to our clinic last week by Dr. Meyer Russel.she is a type II diabetic. She had undergone a right popliteal DES on 11/29 for critical limb ischemia including erythema and pain of her right forefoot. She states about 3 days later she started developing ulcerations in predominantly the right foot but also the left third toe. 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 This is never really healed rib. Repeat ABIs on 12/14 showed a right ABI of 1 and a left ABI of 0.93 with toe pressures be markedly abnormal as noted above and Dr. Marcie Bal notes. The patient is still having a lot of pain making it very difficult for her to function at home this is worse on the right greater than left foot she does describe severe lancinating pain. She does not ambulate all that much and she finds it intolerable to wear footwear Her history is also notable for A. fib, severe cardiomyopathy with an ejection fraction of 25%_30%. Last echocardiogram in June of this year. According to Epic she is on a combination of Eliquis plavix and aspirin. 06/29/17; patient has an appointment with Dr. Imogene Burn of vascular surgery I believe  next week. This was arranged by her primary physician. She is still having a lot of pain in the right side in general most of her toes look  better however. Supposed to be using silver alginate however [facility] applying currently applied wet to dry dressings. 07/06/17; the patient went for appointment with Dr. Imogene Burn. She is status post right popliteal stenting with peroneal artery angioplasty with Dr. Gery Pray. She presented to our clinic with bilateral lower extremity wounds in her toes which are exceptionally painful. Dr. Imogene Burn put her on a course of antibiotics. Her arterial studies from 06/10/17 showed an ABI in the right of 1 and ABI in the left of 0.8. Waveforms at the dorsalis pedis and posterior tibial were biphasic. However routine ABIs were [name], [name] ([id number]) 0.31 bilaterally. The patient has severe bilateral rest pain. 07/20/17; the patient had her arteriogram by Dr. Imogene Burn on 07/14/17. Based on the images the patient was not felt to require any interventions and no further interventions were possible due to small size of the tibial arteries. The posterior tibial arteries bilaterally were occluded heel arteries were the dominant runoff which were miniscule. Anterior tibials both showed subtotal occlusion in the mid segment and these were small vessels. Paradoxically the patient complains of a lot less pain in the right foot however she is having a lot of pain in the left first and left third toes. She is managing this with activity limitation and narcotics. 07/27/17; I've spoken to Dr. Allyson Sabal today and he is going to do another arteriogram on the left leg early next week with possibility of retrograde arterial intervention. The patient is still having a lot of pain in the left third toe and left first toe which is worse when she puts her leg up i.e. lies in bed etc. 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 She has done better on the right which is the side where she had the DES stent placed in the right popliteal. She still has a wound on the medial aspect of the third toe and black eschar at the tip of the fourth  and fifth toes however most of the rest of the right foot looks better. 08/10/17; the patient was taken for angiography on 08/01/17; she had a successful left peroneal PTA and drug eluding stent now has a widely patent peroneal supply collateral to the distal posterior tibial that feeds the dorsal pedal arch. The patient is on antiplatelet therapy. Unfortunately she has not had much relief of pain in the left great and third toe. She has had an increase in her hydrocodone by her primary physician. She had follow-up noninvasive Doppler test done yesterday although I cannot pull up these results 08/17/17; patient returns to clinic. She states she feels better. She's had an increase in her hydrocodone to 10/325 she is taking a half a tablet every 3 hours and occasionally a whole one at night which allows her to sleep through the night. She is not having pain in the right foot but is having pain in the left. She went to see Dr. Allyson Sabal yesterday. He noted that he performed perineal interventions on 2//19 with 2 overlapping drug-eluting stents for perineal CTO. Her left posterior tibial is occluded as was her left anterior tibial at the level of the ankle. Recent Doppler showed patent peroneal stent. He takes it me yesterday to report the patient was in less pain which she seems to verify although this may be because of an increase  in strength and frequency of her narcotics culture the drainage I did last week showed both methicillin sensitive staph aureus and Pseudomonas. I sent in ciprofloxacin I believe for 10 days which should cover both of these. This may be partially why the pain in the right great toe is better Electronic Signature(s) Signed: 08/17/2017 5:05:40 PM By: Baltazar Najjar MD Entered By: Baltazar Najjar on 08/17/2017 14:49:52 Lords, [name] ([id number]) -------------------------------------------------------------------------------- Physical Exam Details Patient Name: [name] Date of Service: 08/17/2017 2:00 PM Medical Record Number: [id number] Patient Account Number: [id number] Date of Birth/Sex: Oct 31, 1945 (71 y.o. Female) Treating RN: Huel Coventry Primary Care Provider: Quintin Alto Other Clinician: Referring Provider: Quintin Alto Treating Provider/Extender: Altamese Crofton in Treatment: 9 Constitutional Sitting or standing Blood Pressure is within target range for patient.. Pulse regular and within target range for patient.Marland Kitchen Respirations regular, non-labored and within target range.. Temperature is normal and within the target range for the patient.Marland Kitchen appears in no distress. Patient appears a lot more comfortable than last week. Respiratory Respiratory effort is easy and symmetric bilaterally. Rate is normal at rest and on room air.. Cardiovascular Pedal pulses absent bilaterally.. Lymphatic None palpable in the popliteal area. Integumentary (Hair, Skin) There is no rash. Notes When exam; oThe left foot has dry gangrene at the tip of the first and third toe. The third toe actually looks some better and there is certainly less drainage than last week although both areas are exceedingly tender. Forefoot is cold oOn the right foot she has a scant amount of dry eschar over the fourth and fifth toes. Her whole forefoot is very cold Electronic Signature(s) Signed: 08/17/2017 5:05:40 PM By: Baltazar Najjar MD Entered By: Baltazar Najjar on 08/17/2017 14:53:27 Lawry, [name] ([id number]) -------------------------------------------------------------------------------- Physician Orders Details Patient Name: [name] Date of Service: 08/17/2017 2:00 PM Medical Record Number: [id number] Patient Account Number: [id number] Date of Birth/Sex: 07/27/1945 (71 y.o. Female) Treating RN: Huel Coventry Primary Care Provider: Quintin Alto Other Clinician: Referring Provider: Quintin Alto Treating Provider/Extender: Altamese  in  Treatment: 9 Verbal / Phone Orders: No Diagnosis Coding Wound Cleansing Wound #1 Right,Distal Toe - Web between 2nd and 3rd o Clean wound with Normal Saline. o May Shower, gently pat wound dry prior to applying new dressing. Wound #2 Right Toe - Web between 3rd and 4th o Clean wound with Normal Saline. o May Shower, gently pat wound dry prior to applying new dressing. Wound #4 Right,Dorsal Toe Fifth o Clean wound with Normal Saline. o May Shower, gently pat wound dry prior to applying new dressing. Wound #5 Left,Distal Toe Great o Clean wound with Normal Saline. o May Shower, gently pat wound dry prior to applying new dressing. Wound #6 Left,Lateral Toe Great o Clean wound with Normal Saline. o May Shower, gently pat wound dry prior to applying new dressing. Wound #7 Left Toe Third o Clean wound with Normal Saline. o May Shower, gently pat wound dry prior to applying new dressing. Anesthetic (add to Medication List) Wound #1 Right,Distal Toe - Web between 2nd and 3rd o Topical Lidocaine 4% cream applied to wound bed prior to debridement (In Clinic Only). Wound #2 Right Toe - Web between 3rd and 4th o Topical Lidocaine 4% cream applied to wound bed prior to debridement (In Clinic Only). Wound #4 Right,Dorsal Toe Fifth o Topical Lidocaine 4% cream applied to wound bed prior to debridement (In Clinic Only). Wound #5 Left,Distal Toe Great o Topical Lidocaine 4% cream applied  to wound bed prior to debridement (In Clinic Only). Wound #6 Left,Lateral Toe Great o Topical Lidocaine 4% cream applied to wound bed prior to debridement (In Clinic Only). Wound #7 Left Toe Third o Topical Lidocaine 4% cream applied to wound bed prior to debridement (In Clinic Only). Primary Wound Dressing Haberland, Lennon G. ([id number]) Wound #1 Right,Distal Toe - Web between 2nd and 3rd o Other: - betadine, silvercell Wound #2 Right Toe - Web between 3rd and 4th o Other:  - betadine, silvercell Wound #4 Right,Dorsal Toe Fifth o Other: - betadine, silvercell Wound #5 Left,Distal Toe Great o Other: - betadine, silvercell Wound #6 Left,Lateral Toe Great o Other: - betadine, silvercell Wound #7 Left Toe Third o Other: - betadine, silvercell Secondary Dressing Wound #1 Right,Distal Toe - Web between 2nd and 3rd o ABD and Kerlix/Conform Wound #2 Right Toe - Web between 3rd and 4th o ABD and Kerlix/Conform Wound #4 Right,Dorsal Toe Fifth o ABD and Kerlix/Conform Wound #5 Left,Distal Toe Great o ABD and Kerlix/Conform Wound #6 Left,Lateral Toe Great o ABD and Kerlix/Conform Wound #7 Left Toe Third o ABD and Kerlix/Conform Dressing Change Frequency Wound #1 Right,Distal Toe - Web between 2nd and 3rd o Change dressing every other day. Wound #2 Right Toe - Web between 3rd and 4th o Change dressing every other day. Wound #4 Right,Dorsal Toe Fifth o Change dressing every other day. Wound #5 Left,Distal Toe Great o Change dressing every other day. Wound #6 Left,Lateral Toe Great o Change dressing every other day. Wound #7 Left Toe Third o Change dressing every other day. Follow-up Appointments Yutzy, MARIGOLD MOM ([id number]) Wound #1 Right,Distal Toe - Web between 2nd and 3rd o Return Appointment in 1 week. Wound #2 Right Toe - Web between 3rd and 4th o Return Appointment in 1 week. Wound #4 Right,Dorsal Toe Fifth o Return Appointment in 1 week. Wound #5 Left,Distal Toe Great o Return Appointment in 1 week. Wound #6 Left,Lateral Toe Great o Return Appointment in 1 week. Wound #7 Left Toe Third o Return Appointment in 1 week. Edema Control Wound #1 Right,Distal Toe - Web between 2nd and 3rd o Elevate legs to the level of the heart and pump ankles as often as possible Wound #2 Right Toe - Web between 3rd and 4th o Elevate legs to the level of the heart and pump ankles as often as possible Wound #4  Right,Dorsal Toe Fifth o Elevate legs to the level of the heart and pump ankles as often as possible Wound #5 Left,Distal Toe Great o Elevate legs to the level of the heart and pump ankles as often as possible Wound #6 Left,Lateral Toe Great o Elevate legs to the level of the heart and pump ankles as often as possible Wound #7 Left Toe Third o Elevate legs to the level of the heart and pump ankles as often as possible Off-Loading Wound #1 Right,Distal Toe - Web between 2nd and 3rd o Open toe surgical shoe to: Wound #2 Right Toe - Web between 3rd and 4th o Open toe surgical shoe to: Wound #4 Right,Dorsal Toe Fifth o Open toe surgical shoe to: Wound #5 Left,Distal Toe Great o Open toe surgical shoe to: Wound #6 Left,Lateral Toe Great o Open toe surgical shoe to: Wound #7 Left Toe Third o Open toe surgical shoe to: Fedrick, Finnlee G. ([id number]) Additional Orders / Instructions o Increase protein intake. Home Health Wound #1 Right,Distal Toe - Web between 2nd and 3rd o Continue Home Health Visits -  [facility] o Home Health Nurse may visit PRN to address patientos wound care needs. o FACE TO FACE ENCOUNTER: MEDICARE and MEDICAID PATIENTS: I certify that this patient is under my care and that I had a face-to-face encounter that meets the physician face-to-face encounter requirements with this patient on this date. The encounter with the patient was in whole or in part for the following MEDICAL CONDITION: (primary reason for Home Healthcare) MEDICAL NECESSITY: I certify, that based on my findings, NURSING services are a medically necessary home health service. HOME BOUND STATUS: I certify that my clinical findings support that this patient is homebound (i.e., Due to illness or injury, pt requires aid of supportive devices such as crutches, cane, wheelchairs, walkers, the use of special transportation or the assistance of another person to leave their place of  residence. There is a normal inability to leave the home and doing so requires considerable and taxing effort. Other absences are for medical reasons / religious services and are infrequent or of short duration when for other reasons). o If current dressing causes regression in wound condition, may D/C ordered dressing product/s and apply Normal Saline Moist Dressing daily until next Wound Healing Center / Other MD appointment. Notify Wound Healing Center of regression in wound condition at [phone]. o Please direct any NON-WOUND related issues/requests for orders to patient's Primary Care Physician Wound #2 Right Toe - Web between 3rd and 4th o Continue Home Health Visits - [facility] o Home Health Nurse may visit PRN to address patientos wound care needs. o FACE TO FACE ENCOUNTER: MEDICARE and MEDICAID PATIENTS: I certify that this patient is under my care and that I had a face-to-face encounter that meets the physician face-to-face encounter requirements with this patient on this date. The encounter with the patient was in whole or in part for the following MEDICAL CONDITION: (primary reason for Home Healthcare) MEDICAL NECESSITY: I certify, that based on my findings, NURSING services are a medically necessary home health service. HOME BOUND STATUS: I certify that my clinical findings support that this patient is homebound (i.e., Due to illness or injury, pt requires aid of supportive devices such as crutches, cane, wheelchairs, walkers, the use of special transportation or the assistance of another person to leave their place of residence. There is a normal inability to leave the home and doing so requires considerable and taxing effort. Other absences are for medical reasons / religious services and are infrequent or of short duration when for other reasons). o If current dressing causes regression in wound condition, may D/C ordered dressing product/s and apply Normal Saline  Moist Dressing daily until next Wound Healing Center / Other MD appointment. Notify Wound Healing Center of regression in wound condition at [phone]. o Please direct any NON-WOUND related issues/requests for orders to patient's Primary Care Physician Wound #4 Right,Dorsal Toe Fifth o Continue Home Health Visits - [facility] o Home Health Nurse may visit PRN to address patientos wound care needs. o FACE TO FACE ENCOUNTER: MEDICARE and MEDICAID PATIENTS: I certify that this patient is under my care and that I had a face-to-face encounter that meets the physician face-to-face encounter requirements with this patient on this date. The encounter with the patient was in whole or in part for the following MEDICAL CONDITION: (primary reason for Home Healthcare) MEDICAL NECESSITY: I certify, that based on my findings, NURSING services are a medically necessary home health service. HOME BOUND STATUS: I certify that my clinical findings support that this patient is  homebound (i.e., Due to illness or injury, pt requires aid of supportive devices such as crutches, cane, wheelchairs, walkers, the use of special transportation or the assistance of another person to leave their place of residence. There is a normal inability to leave the home and doing so requires considerable and taxing effort. Other absences are for medical reasons / religious services and are infrequent or of short duration when for other reasons). o If current dressing causes regression in wound condition, may D/C ordered dressing product/s and apply Normal Saline Moist Dressing daily until next Wound Healing Center / Other MD appointment. Notify Wound Healing Center of regression in wound condition at [phone]. o Please direct any NON-WOUND related issues/requests for orders to patient's Primary Care Physician Wound #5 [address] [name] ([id number]) o Continue Home Health Visits - [facility] o  Home Health Nurse may visit PRN to address patientos wound care needs. o FACE TO FACE ENCOUNTER: MEDICARE and MEDICAID PATIENTS: I certify that this patient is under my care and that I had a face-to-face encounter that meets the physician face-to-face encounter requirements with this patient on this date. The encounter with the patient was in whole or in part for the following MEDICAL CONDITION: (primary reason for Home Healthcare) MEDICAL NECESSITY: I certify, that based on my findings, NURSING services are a medically necessary home health service. HOME BOUND STATUS: I certify that my clinical findings support that this patient is homebound (i.e., Due to illness or injury, pt requires aid of supportive devices such as crutches, cane, wheelchairs, walkers, the use of special transportation or the assistance of another person to leave their place of residence. There is a normal inability to leave the home and doing so requires considerable and taxing effort. Other absences are for medical reasons / religious services and are infrequent or of short duration when for other reasons). o If current dressing causes regression in wound condition, may D/C ordered dressing product/s and apply Normal Saline Moist Dressing daily until next Wound Healing Center / Other MD appointment. Notify Wound Healing Center of regression in wound condition at [phone]. o Please direct any NON-WOUND related issues/requests for orders to patient's Primary Care Physician Wound #6 Left,Lateral Toe Doretha Sou Continue Home Health Visits - [facility] o Home Health Nurse may visit PRN to address patientos wound care needs. o FACE TO FACE ENCOUNTER: MEDICARE and MEDICAID PATIENTS: I certify that this patient is under my care and that I had a face-to-face encounter that meets the physician face-to-face encounter requirements with this patient on this date. The encounter with the patient was in whole or in part for the  following MEDICAL CONDITION: (primary reason for Home Healthcare) MEDICAL NECESSITY: I certify, that based on my findings, NURSING services are a medically necessary home health service. HOME BOUND STATUS: I certify that my clinical findings support that this patient is homebound (i.e., Due to illness or injury, pt requires aid of supportive devices such as crutches, cane, wheelchairs, walkers, the use of special transportation or the assistance of another person to leave their place of residence. There is a normal inability to leave the home and doing so requires considerable and taxing effort. Other absences are for medical reasons / religious services and are infrequent or of short duration when for other reasons). o If current dressing causes regression in wound condition, may D/C ordered dressing product/s and apply Normal Saline Moist Dressing daily until next Wound Healing Center / Other MD appointment. Notify Wound Healing  Center of regression in wound condition at [phone]. o Please direct any NON-WOUND related issues/requests for orders to patient's Primary Care Physician Wound #7 Left Toe Third o Continue Home Health Visits - [facility] o Home Health Nurse may visit PRN to address patientos wound care needs. o FACE TO FACE ENCOUNTER: MEDICARE and MEDICAID PATIENTS: I certify that this patient is under my care and that I had a face-to-face encounter that meets the physician face-to-face encounter requirements with this patient on this date. The encounter with the patient was in whole or in part for the following MEDICAL CONDITION: (primary reason for Home Healthcare) MEDICAL NECESSITY: I certify, that based on my findings, NURSING services are a medically necessary home health service. HOME BOUND STATUS: I certify that my clinical findings support that this patient is homebound (i.e., Due to illness or injury, pt requires aid of supportive devices such as crutches, cane,  wheelchairs, walkers, the use of special transportation or the assistance of another person to leave their place of residence. There is a normal inability to leave the home and doing so requires considerable and taxing effort. Other absences are for medical reasons / religious services and are infrequent or of short duration when for other reasons). o If current dressing causes regression in wound condition, may D/C ordered dressing product/s and apply Normal Saline Moist Dressing daily until next Wound Healing Center / Other MD appointment. Notify Wound Healing Center of regression in wound condition at [phone]. o Please direct any NON-WOUND related issues/requests for orders to patient's Primary Care Physician Patient Medications Allergies: oxycodone, Ranexa, Statins-Hmg-Coa Reductase Inhibitors Notifications Medication Indication Start End lidocaine Linzy, Nyeshia G. ([id number]) Notifications Medication Indication Start End DOSE topical 4 % cream - cream topical Electronic Signature(s) Signed: 08/17/2017 5:05:40 PM By: Baltazar Najjar MD Signed: 08/17/2017 5:24:42 PM By: Elliot Gurney, BSN, RN, CWS, Kim RN, BSN Entered By: Elliot Gurney, BSN, RN, CWS, Kim on 08/17/2017 14:07:05 Leathers, [name] ([id number]) -------------------------------------------------------------------------------- Prescription 08/17/2017 Patient Name: [name] Provider: Maxwell Caul MD Date of Birth: 1946/06/15 NPI#: [id number] Sex: F DEA#: [id number] Phone #: [phone] License #: [id number] Patient Address: [facility] [address] [facility] [address] [facility] [place], [place] [address] [address] [place], [place] [address] [phone] Allergies oxycodone Ranexa Statins-Hmg-Coa Reductase Inhibitors Medication Medication: Route: Strength: Form: lidocaine topical 4% cream Class: TOPICAL LOCAL ANESTHETICS Dose: Frequency / Time:  Indication: cream topical Number of Refills: Number of Units: 0 Generic Substitution: Start Date: End Date: Administered at Substitution Permitted Facility: Yes Time Administered: Time Discontinued: Note to Pharmacy: [facility]): Date(s): Electronic Signature(s) JULINE, SANDERFORD ([id number]) Signed: 08/17/2017 5:05:40 PM By: Baltazar Najjar MD Signed: 08/17/2017 5:24:42 PM By: Elliot Gurney, BSN, RN, CWS, Kim RN, BSN Entered By: Elliot Gurney, BSN, RN, CWS, Kim on 08/17/2017 14:07:06 Mutchler, [name] ([id number]) --------------------------------------------------------------------------------  Problem List Details Patient Name: [name] Date of Service: 08/17/2017 2:00 PM Medical Record Number: [id number] Patient Account Number: [id number] Date of Birth/Sex: 1945-11-26 (71 y.o. 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 Female) Treating RN: Huel Coventry Primary Care Provider:  Quintin Alto Other Clinician: Referring Provider: Quintin Alto Treating Provider/Extender: Altamese Bartlett in Treatment: 9 Subjective History of Present Illness (HPI) The following HPI elements were documented for the patient's wound: Location: right foot toes Quality: Patient reports experiencing a sharp pain to affected area(s). Severity: Patient states wound are getting worse. Duration: Patient has had the wound for < 2 weeks prior to presenting for treatment Timing: Pain in wound is constant (hurts all the time) Context: The wound appeared gradually over time Modifying Factors: Other treatment(s) tried include:recent interventional process to place a stent in her right lower extremity for critical limb ischemia Associated Signs and Symptoms: Patient reports having foul odor. 72 year old female who was referred by Dr. Nanetta Batty, who performed a endovascular procedure on 05/26/2017, for critical limb ischemia. Her angiogram showed a 99% below the knee popliteal stenosis which is stented with a 4 mm drug eluting stent. On 06/10/2017 he noted that her pulses are palpable and her Dopplers have normalized. He referred her to wound care for further advice and put her on Keflex 500 mg twice daily for 10 days. She has a past medical history of asthma, coronary artery disease, CHF, hyperlipidemia, ischemic cardiomyopathy, peripheral arterial disease and type 2 diabetes mellitus. She is status post recent abdominal aortogram with right lower extremity stenting, cholecystectomy, CABG, parathyroidectomy, thyroidectomy. She is a former smoker and quit in 1988. 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 The right ABI was 1.0 and biphasic and the left ABI was 0.93 and biphasic. Toe pressures were abnormal with the right being 0.27 and left being 0.31. the duplex study done showed a patent  right popliteal artery stent without evidence of focal stenosis or obstruction. of note the patient started having signs of critical limb ischemia somewhere at the end of November and after an urgent workup was taken up for limb salvage with a successfully placed stent by Dr. Nanetta Batty, with good arterial perfusion postprocedure with normal ABIs noted on 06/10/2017. 06/22/17; patient was admitted to our clinic last week by Dr. Meyer Russel.she is a type II diabetic. She had undergone a right popliteal DES on 11/29 for critical limb ischemia including erythema and pain of her right forefoot. She states about 3 days later she started developing ulcerations in predominantly the right foot but also the left third toe. 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 This is never really healed rib. Repeat ABIs on 12/14 showed a right ABI of 1 and a left ABI of 0.93 with toe pressures be markedly abnormal as noted above and Dr. Marcie Bal notes. The patient is still having a lot of pain making it very difficult for her to function at home this is worse on the right greater than left foot she does describe severe lancinating pain. She does not ambulate all that much and she finds it intolerable to wear footwear Her history is also notable for A. fib, severe cardiomyopathy with an ejection fraction of 25%_30%. Last echocardiogram in June  of this year. According to Epic she is on a combination of Eliquis plavix and aspirin. 06/29/17; patient has an appointment with Dr. Imogene Burn of vascular surgery I believe next week. This was arranged by her primary physician. She is still having a lot of pain in the right side in general most of her toes look better however. Supposed to be using silver alginate however [facility] applying currently applied wet to dry dressings. 07/06/17; the patient went for appointment with Dr. Imogene Burn. She is status post right popliteal stenting with  peroneal artery angioplasty with Dr. Gery Pray. She presented to our clinic with bilateral lower extremity wounds in her toes which are exceptionally painful. Dr. Imogene Burn put her on a course of antibiotics. Her arterial studies from 06/10/17 showed an ABI in the right of 1 and ABI in the left of 0.8. Waveforms at the dorsalis pedis and posterior tibial were biphasic. However routine ABIs were Janelle, Loucile G. ([id number]) 0.31 bilaterally. The patient has severe bilateral rest pain. 07/20/17; the patient had her arteriogram by Dr. Imogene Burn on 07/14/17. Based on the images the patient was not felt to require any interventions and no further interventions were possible due to small size of the tibial arteries. The posterior tibial arteries bilaterally were occluded heel arteries were the dominant runoff which were miniscule. Anterior tibials both showed subtotal occlusion in the mid segment and these were small vessels. Paradoxically the patient complains of a lot less pain in the right foot however she is having a lot of pain in the left first and left third toes. She is managing this with activity limitation and narcotics. 07/27/17; I've spoken to Dr. Allyson Sabal today and he is going to do another arteriogram on the left leg early next week with possibility of retrograde arterial intervention. The patient is still having a lot of pain in the left third toe and left first toe which is worse when she puts her leg up i.e. lies in bed etc. 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 She has done better on the right which is the side where she had the DES stent placed in the right popliteal. She still has a wound on the medial aspect of the third toe and black eschar at the tip of the fourth and fifth toes however most of the rest of the right foot looks better. 08/10/17; the patient was taken for angiography on 08/01/17; she had a successful left peroneal PTA and drug eluding stent now has a widely patent peroneal supply  collateral to the distal posterior tibial that feeds the dorsal pedal arch. The patient is on antiplatelet therapy. Unfortunately she has not had much relief of pain in the left great and third toe. She has had an increase in her hydrocodone by her primary physician. She had follow-up noninvasive Doppler test done yesterday although I cannot pull up these results 08/17/17; patient returns to clinic. She states she feels better. She's had an increase in her hydrocodone to 10/325 she is taking a half a tablet every 3 hours and occasionally a whole one at night which allows her to sleep through the night. She is not having pain in the right foot but is having pain in the left. She went to see Dr. Allyson Sabal yesterday. He noted that he performed perineal interventions on 2//19 with 2 overlapping drug-eluting stents for perineal CTO. Her left posterior tibial is occluded as was her left anterior tibial at the level of the ankle. Recent Doppler showed  patent peroneal stent. He takes it me yesterday to report the patient was in less pain which she seems to verify although this may be because of an increase in strength and frequency of her narcotics culture the drainage I did last week showed both methicillin sensitive staph aureus and Pseudomonas. I sent in ciprofloxacin I believe for 10 days which should cover both of these. This may be partially why the pain in the right great toe is better Objective Constitutional Sitting or standing Blood Pressure is within target range for patient.. Pulse regular and within target range for patient.Marland Kitchen Respirations regular, non-labored and within target range.. Temperature is normal and within the target range for the patient.Marland Kitchen appears in no distress. Patient appears a lot more comfortable than last week. Vitals Time Taken: 1:42 PM, Height: 63 in, Weight: 176 lbs, BMI: 31.2, Temperature: 98.1 F, Pulse: 86 bpm, Respiratory Rate: 18 breaths/min, Blood Pressure: 108/66  mmHg. Respiratory Respiratory effort is easy and symmetric bilaterally. Rate is normal at rest and on room air.. Cardiovascular Pedal pulses absent bilaterally.. Lymphatic None palpable in the popliteal area. General Notes: When exam; The left foot has dry gangrene at the tip of the first and third toe. The third toe actually looks some better and there is certainly less drainage than last week although both areas are exceedingly tender. Forefoot is cold Theil, Kymora G. ([id number]) On the right foot she has a scant amount of dry eschar over the fourth and fifth toes. Her whole forefoot is very cold Integumentary (Hair, Skin) There is no rash. Wound #1 status is Open. Original cause of wound was Gradually Appeared. The wound is located on the Right,Distal Toe - Web between 2nd and 3rd. The wound measures 0.5cm length x 0.5cm width x 0.1cm depth; 0.196cm^2 area and 0.02cm^3 volume. There is no tunneling or undermining noted. There is a large amount of serous drainage noted. Foul odor after cleansing was noted. The wound margin is distinct with the outline attached to the wound base. There is no granulation within the wound bed. There is a large (67-100%) amount of necrotic tissue within the wound bed including Adherent Slough. The periwound skin appearance exhibited: Maceration. Periwound temperature was noted as No Abnormality. The periwound has tenderness on palpation. Wound #2 status is Open. Original cause of wound was Pressure Injury. The wound is located on the Right Toe - Web between 3rd and 4th. The wound measures 0.6cm length x 0.4cm width x 0.1cm depth; 0.188cm^2 area and 0.019cm^3 volume. There is no tunneling or undermining noted. There is a large amount of serous drainage noted. Foul odor after cleansing was noted. The wound margin is distinct with the outline attached to the wound base. There is no granulation within the wound bed. There is a large (67-100%) amount of necrotic  tissue within the wound bed including Adherent Slough. The periwound skin appearance exhibited: Maceration. Periwound temperature was noted as No Abnormality. The periwound has tenderness on palpation. Wound #4 status is Open. Original cause of wound was Gradually Appeared. The wound is located on the Right,Dorsal Toe Fifth. The wound measures 0.2cm length x 0.2cm width x 0.1cm depth; 0.031cm^2 area and 0.003cm^3 volume. There is no tunneling or undermining noted. There is a large amount of serous drainage noted. The wound margin is flat and intact. There is no granulation within the wound bed. There is a large (67-100%) amount of necrotic tissue within the wound bed including Eschar and Adherent Slough. The periwound skin appearance exhibited:  Maceration. The periwound skin appearance did not exhibit: Callus, Crepitus, Excoriation, Induration, Rash, Scarring, Dry/Scaly, Atrophie Blanche, Cyanosis, Ecchymosis, Hemosiderin Staining, Mottled, Pallor, Rubor, Erythema. Periwound temperature was noted as No Abnormality. The periwound has tenderness on palpation. Wound #5 status is Open. Original cause of wound was Gradually Appeared. The wound is located on the [employer]. The wound measures 4.8cm length x 4.3cm width x 0.1cm depth; 16.211cm^2 area and 1.621cm^3 volume. There is a large amount of serous drainage noted. Foul odor after cleansing was noted. The wound margin is flat and intact. There is no granulation within the wound bed. There is a large (67-100%) amount of necrotic tissue within the wound bed including Eschar and Adherent Slough. The periwound skin appearance exhibited: Callus, Induration, Scarring, Dry/Scaly. The periwound skin appearance did not exhibit: Crepitus, Excoriation, Rash, Maceration, Atrophie Blanche, Cyanosis, Ecchymosis, Hemosiderin Staining, Mottled, Pallor, Rubor, Erythema. Periwound temperature was noted as No Abnormality. The periwound has tenderness on  palpation. Wound #6 status is Open. Original cause of wound was Gradually Appeared. The wound is located on the [employer]. The wound measures 1cm length x 0.6cm width x 0.1cm depth; 0.471cm^2 area and 0.047cm^3 volume. There is no tunneling or undermining noted. There is a large amount of purulent drainage noted. Foul odor after cleansing was noted. The wound margin is distinct with the outline attached to the wound base. There is no granulation within the wound bed. There is a large (67-100%) amount of necrotic tissue within the wound bed including Eschar and Adherent Slough. The periwound skin appearance exhibited: Callus, Crepitus, Induration, Maceration. The periwound skin appearance did not exhibit: Excoriation, Rash, Scarring, Dry/Scaly, Atrophie Blanche, Cyanosis, Ecchymosis, Hemosiderin Staining, Mottled, Pallor, Rubor, Erythema. Periwound temperature was noted as No Abnormality. The periwound has tenderness on palpation. Wound #7 status is Open. Original cause of wound was Gradually Appeared. The wound is located on the Left Toe Third. The wound measures 2cm length x 1.5cm width x 0.1cm depth; 2.356cm^2 area and 0.236cm^3 volume. There is no tunneling or undermining noted. There is a large amount of purulent drainage noted. Foul odor after cleansing was noted. The wound margin is distinct with the outline attached to the wound base. There is no granulation within the wound bed. There is a large (67-100%) amount of necrotic tissue within the wound bed including Eschar and Adherent Slough. The periwound skin appearance exhibited: Callus. The periwound skin appearance did not exhibit: Crepitus, Excoriation, Induration, Rash, Scarring, Dry/Scaly, Maceration, Atrophie Blanche, Cyanosis, Ecchymosis, Hemosiderin Staining, Mottled, Pallor, Rubor, Erythema. Periwound temperature was noted as No Abnormality. The periwound has tenderness on palpation. 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 May Shower, gently pat wound dry prior to applying new dressing. Wound #2 Right Toe - Web between 3rd and 4th: Clean wound with Normal Saline. May Shower, gently pat wound dry prior to applying new dressing. Wound #4 Right,Dorsal Toe Fifth: Clean wound with Normal Saline. May Shower, gently pat wound dry prior to applying new dressing. Wound #5 Left,Distal Toe Great: Clean wound with Normal Saline. May Shower, gently pat wound dry prior to applying new dressing.  Wound #6 Left,Lateral Toe Great: Clean wound with Normal Saline. May Shower, gently pat wound dry prior to applying new dressing. Wound #7 Left Toe Third: Clean wound with Normal Saline. May Shower, gently pat wound dry prior to applying new dressing. Anesthetic (add to Medication List): Wound #1 Right,Distal Toe - Web between 2nd and 3rd: Topical Lidocaine 4% cream applied to wound bed prior to debridement (In Clinic Only). Wound #2 Right Toe - Web between 3rd and 4th: Topical Lidocaine 4% cream applied to wound bed prior to debridement (In Clinic Only). Wound #4 Right,Dorsal Toe Fifth: Topical Lidocaine 4% cream applied to wound bed prior to debridement (In Clinic Only). Wound #5 Left,Distal Toe Great: Topical Lidocaine 4% cream applied to wound bed prior to debridement (In Clinic Only). Wound #6 Left,Lateral Toe Great: Topical Lidocaine 4% cream applied to wound bed prior to debridement (In Clinic Only). Wound #7 Left Toe Third: Topical Lidocaine 4% cream applied to wound bed prior to  debridement (In Clinic Only). Primary Wound Dressing: Wound #1 Right,Distal Toe - Web between 2nd and 3rd: Other: - betadine, silvercell Wound #2 Right Toe - Web between 3rd and 4th: Other: - betadine, silvercell Wound #4 Right,Dorsal Toe Fifth: Other: - betadine, silvercell Wound #5 Left,Distal Toe Great: Other: - betadine, silvercell Wound #6 Left,Lateral Toe Great: Heino, Julie-Ann G. ([id number]) Other: - betadine, silvercell Wound #7 Left Toe Third: Other: - betadine, silvercell Secondary Dressing: Wound #1 Right,Distal Toe - Web between 2nd and 3rd: ABD and Kerlix/Conform Wound #2 Right Toe - Web between 3rd and 4th: ABD and Kerlix/Conform Wound #4 Right,Dorsal Toe Fifth: ABD and Kerlix/Conform Wound #5 Left,Distal Toe Great: ABD and Kerlix/Conform Wound #6 Left,Lateral Toe Great: ABD and Kerlix/Conform Wound #7 Left Toe Third: ABD and Kerlix/Conform Dressing Change Frequency: Wound #1 Right,Distal Toe - Web between 2nd and 3rd: Change dressing every other day. Wound #2 Right Toe - Web between 3rd and 4th: Change dressing every other day. Wound #4 Right,Dorsal Toe Fifth: Change dressing every other day. Wound #5 Left,Distal Toe Great: Change dressing every other day. Wound #6 Left,Lateral Toe Great: Change dressing every other day. Wound #7 Left Toe Third: Change dressing every other day. Follow-up Appointments: Wound #1 Right,Distal Toe - Web between 2nd and 3rd: Return Appointment in 1 week. Wound #2 Right Toe - Web between 3rd and 4th: Return Appointment in 1 week. Wound #4 Right,Dorsal Toe Fifth: Return Appointment in 1 week. Wound #5 Left,Distal Toe Great: Return Appointment in 1 week. Wound #6 Left,Lateral Toe Great: Return Appointment in 1 week. Wound #7 Left Toe Third: Return Appointment in 1 week. Edema Control: Wound #1 Right,Distal Toe - Web between 2nd and 3rd: Elevate legs to the level of the heart and pump ankles as often as possible Wound #2  Right Toe - Web between 3rd and 4th: Elevate legs to the level of the heart and pump ankles as often as possible Wound #4 Right,Dorsal Toe Fifth: Elevate legs to the level of the heart and pump ankles as often as possible Wound #5 Left,Distal Toe Great: Elevate legs to the level of the heart and pump ankles as often as possible Wound #6 Left,Lateral Toe Great: Elevate legs to the level of the heart and pump ankles as often as possible Wound #7 Left Toe Third: Elevate legs to the level of the heart and pump ankles as often as possible Off-Loading: Wound #1 Right,Distal Toe - Web between 2nd and 3rd: Open toe surgical shoe to: Wound #2 Right  Toe - Web between 3rd and 4th: Open toe surgical shoe to: Stepanian, Romanita G. ([id number]) Wound #4 Right,Dorsal Toe Fifth: Open toe surgical shoe to: Wound #5 Left,Distal Toe Great: Open toe surgical shoe to: Wound #6 Left,Lateral Toe Great: Open toe surgical shoe to: Wound #7 Left Toe Third: Open toe surgical shoe to: Additional Orders / Instructions: Increase protein intake. Home Health: Wound #1 Right,Distal Toe - Web between 2nd and 3rd: Continue Home Health Visits - [facility] Nurse may visit PRN to address patient s wound care needs. FACE TO FACE ENCOUNTER: MEDICARE and MEDICAID PATIENTS: I certify that this patient is under my care and that I had a face-to-face encounter that meets the physician face-to-face encounter requirements with this patient on this date. The encounter with the patient was in whole or in part for the following MEDICAL CONDITION: (primary reason for Home Healthcare) MEDICAL NECESSITY: I certify, that based on my findings, NURSING services are a medically necessary home health service. HOME BOUND STATUS: I certify that my clinical findings support that this patient is homebound (i.e., Due to illness or injury, pt requires aid of supportive devices such as crutches, cane, wheelchairs, walkers, the use of  special transportation or the assistance of another person to leave their place of residence. There is a normal inability to leave the home and doing so requires considerable and taxing effort. Other absences are for medical reasons / religious services and are infrequent or of short duration when for other reasons). If current dressing causes regression in wound condition, may D/C ordered dressing product/s and apply Normal Saline Moist Dressing daily until next Wound Healing Center / Other MD appointment. Notify Wound Healing Center of regression in wound condition at [phone]. Please direct any NON-WOUND related issues/requests for orders to patient's Primary Care Physician Wound #2 Right Toe - Web between 3rd and 4th: Continue Home Health Visits - [facility] Nurse may visit PRN to address patient s wound care needs. FACE TO FACE ENCOUNTER: MEDICARE and MEDICAID PATIENTS: I certify that this patient is under my care and that I had a face-to-face encounter that meets the physician face-to-face encounter requirements with this patient on this date. The encounter with the patient was in whole or in part for the following MEDICAL CONDITION: (primary reason for Home Healthcare) MEDICAL NECESSITY: I certify, that based on my findings, NURSING services are a medically necessary home health service. HOME BOUND STATUS: I certify that my clinical findings support that this patient is homebound (i.e., Due to illness or injury, pt requires aid of supportive devices such as crutches, cane, wheelchairs, walkers, the use of special transportation or the assistance of another person to leave their place of residence. There is a normal inability to leave the home and doing so requires considerable and taxing effort. Other absences are for medical reasons / religious services and are infrequent or of short duration when for other reasons). If current dressing causes regression in wound condition,  may D/C ordered dressing product/s and apply Normal Saline Moist Dressing daily until next Wound Healing Center / Other MD appointment. Notify Wound Healing Center of regression in wound condition at [phone]. Please direct any NON-WOUND related issues/requests for orders to patient's Primary Care Physician Wound #4 Right,Dorsal Toe Fifth: Continue Home Health Visits - [facility] may visit [facility] to address patient s wound care needs. FACE TO FACE ENCOUNTER: MEDICARE and MEDICAID PATIENTS: I certify that this patient is under my care and that  I had a face-to-face encounter that meets the physician face-to-face encounter requirements with this patient on this date. The encounter with the patient was in whole or in part for the following MEDICAL CONDITION: (primary reason for Home Healthcare) MEDICAL NECESSITY: I certify, that based on my findings, NURSING services are a medically necessary home health service. HOME BOUND STATUS: I certify that my clinical findings support that this patient is homebound (i.e., Due to illness or injury, pt requires aid of supportive devices such as crutches, cane, wheelchairs, walkers, the use of special transportation or the assistance of another person to leave their place of residence. There is a normal inability to leave the home and doing so requires considerable and taxing effort. Other absences are for medical reasons / religious services and are infrequent or of short duration when for other reasons). If current dressing causes regression in wound condition, may D/C ordered dressing product/s and apply Normal Saline Moist Dressing daily until next Wound Healing Center / Other MD appointment. Notify Wound Healing Center of regression in wound condition at [phone]. Please direct any NON-WOUND related issues/requests for orders to patient's Primary Care Physician Wound #5 Left,Distal Toe [place], LUBIN ([id number]) Continue Home  Health Visits - [facility] Nurse may visit [facility] to address patient s wound care needs. FACE TO FACE ENCOUNTER: MEDICARE and MEDICAID PATIENTS: I certify that this patient is under my care and that I had a face-to-face encounter that meets the physician face-to-face encounter requirements with this patient on this date. The encounter with the patient was in whole or in part for the following MEDICAL CONDITION: (primary reason for Home Healthcare) MEDICAL NECESSITY: I certify, that based on my findings, NURSING services are a medically necessary home health service. HOME BOUND STATUS: I certify that my clinical findings support that this patient is homebound (i.e., Due to illness or injury, pt requires aid of supportive devices such as crutches, cane, wheelchairs, walkers, the use of special transportation or the assistance of another person to leave their place of residence. There is a normal inability to leave the home and doing so requires considerable and taxing effort. Other absences are for medical reasons / religious services and are infrequent or of short duration when for other reasons). If current dressing causes regression in wound condition, may D/C ordered dressing product/s and apply Normal Saline Moist Dressing daily until next Wound Healing Center / Other MD appointment. Notify Wound Healing Center of regression in wound condition at [phone]. Please direct any NON-WOUND related issues/requests for orders to patient's Primary Care Physician Wound #6 Left,Lateral Toe Great: Continue Home [facility] Visits - [facility] may visit [facility] to address patient s wound care needs. FACE TO FACE ENCOUNTER: MEDICARE and MEDICAID PATIENTS: I certify that this patient is under my care and that I had a face-to-face encounter that meets the physician face-to-face encounter requirements with this patient on this date. The encounter with the patient was in whole or in part for  the following MEDICAL CONDITION: (primary reason for Home Healthcare) MEDICAL NECESSITY: I certify, that based on my findings, NURSING services are a medically necessary home health service. HOME BOUND STATUS: I certify that my clinical findings support that this patient is homebound (i.e., Due to illness or injury, pt requires aid of supportive devices such as crutches, cane, wheelchairs, walkers, the use of special transportation or the assistance of another person to leave their place of residence. There is a normal inability to leave  the home and doing so requires considerable and taxing effort. Other absences are for medical reasons / religious services and are infrequent or of short duration when for other reasons). If current dressing causes regression in wound condition, may D/C ordered dressing product/s and apply Normal Saline Moist Dressing daily until next Wound Healing Center / Other MD appointment. Notify Wound Healing Center of regression in wound condition at [phone]. Please direct any NON-WOUND related issues/requests for orders to patient's Primary Care Physician Wound #7 Left Toe Third: Continue Home [facility] Visits - [facility] may visit [facility] to address patient s wound care needs. FACE TO FACE ENCOUNTER: MEDICARE and MEDICAID PATIENTS: I certify that this patient is under my care and that I had a face-to-face encounter that meets the physician face-to-face encounter requirements with this patient on this date. The encounter with the patient was in whole or in part for the following MEDICAL CONDITION: (primary reason for Home Healthcare) MEDICAL NECESSITY: I certify, that based on my findings, NURSING services are a medically necessary home health service. HOME BOUND STATUS: I certify that my clinical findings support that this patient is homebound (i.e., Due to illness or injury, pt requires aid of supportive devices such as crutches, cane, wheelchairs,  walkers, the use of special transportation or the assistance of another person to leave their place of residence. There is a normal inability to leave the home and doing so requires considerable and taxing effort. Other absences are for medical reasons / religious services and are infrequent or of short duration when for other reasons). If current dressing causes regression in wound condition, may D/C ordered dressing product/s and apply Normal Saline Moist Dressing daily until next Wound Healing Center / Other MD appointment. Notify Wound Healing Center of regression in wound condition at [phone]. Please direct any NON-WOUND related issues/requests for orders to patient's Primary Care Physician The following medication(s) was prescribed: lidocaine topical 4 % cream cream topical was prescribed at facility #1 Dr. Allyson Sabal had communicated with me that he thought the pain was a lot better on the left although I'm not completely certain about this. There is certainly a lot less drainage in the left great toe and this may be because of the antibiotics. Stegenga, [name] ([id number]) #2 her foot is still very cold and the toes are very tender with palpation. #3 Dr. Allyson Sabal also apparently communicated with the patient about Dr. Pernell Dupre at [facility] however will continue to follow her locally for now #4 she is paining the eschar with Betadine and putting silver alginate on the area of this will continue Electronic Signature(s) Signed: 08/17/2017 5:05:40 PM By: Baltazar Najjar MD Entered By: Baltazar Najjar on 08/17/2017 14:55:15 Youtz, [name] ([id number]) -------------------------------------------------------------------------------- SuperBill Details Patient Name: [name] Date of Service: 08/17/2017 Medical Record Number: [id number] Patient Account Number: [id number] Date of Birth/Sex: 11/04/1945 (72 y.o. 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 Facility Procedures CPT4 Code: [id number] Description: 36644 - WOUND CARE VISIT-LEV 5 EST PT Modifier: Quantity:  1 Physician Procedures CPT4 Code Description: [id number] - WC PHYS LEVEL 3 - EST PT ICD-10 Diagnosis Description I70.245 Atherosclerosis of native arteries of left leg with ulceration of I70.235 Atherosclerosis of native arteries of right leg with ulceration o Modifier: other part of foo f other part of fo Quantity: 1 t ot Electronic Signature(s) Signed: 08/17/2017 5:05:40 PM By: Baltazar Najjarobson, Michael MD Entered By: Baltazar Najjarobson, Michael on 08/17/2017 14:56:45

## 2017-08-20 NOTE — Progress Notes (Signed)
KATHYLEEN, RADICE ([id number]) Visit Report for 08/17/2017 Arrival Information Details Patient Name: [name], [name] Date of Service: 08/17/2017 2:00 PM Medical Record Number: [id number] Patient Account Number: [id number] Date of Birth/Sex: 28-Nov-1945 (72 y.o. Female) Treating RN: Ashok Cordia, Debi Primary Care Crissa Sowder: Quintin Alto Other Clinician: Referring Hosea Hanawalt: Quintin Alto Treating Starr Engel/Extender: Altamese [place] in Treatment: 9 Visit Information History Since Last Visit All ordered tests and consults were completed: No Patient Arrived: Wheel Chair Added or deleted any medications: No Arrival Time: 13:39 Any new allergies or adverse reactions: No Accompanied By: husband Had a fall or experienced change in No Transfer Assistance: None activities of daily living that may affect Patient Identification Verified: Yes risk of falls: Secondary Verification Process Yes Signs or symptoms of abuse/neglect since last visito No Completed: Hospitalized since last visit: No Patient Requires Transmission-Based No Has Dressing in Place as Prescribed: Yes Precautions: Pain Present Now: No Patient Has Alerts: Yes Patient Alerts: Patient on Blood Thinner Prediabetic Eliquis Electronic Signature(s) Signed: 08/19/2017 8:00:35 AM By: Alejandro Mulling Entered By: Alejandro Mulling on 08/17/2017 13:41:58 Granholm, [name] ([id number]) -------------------------------------------------------------------------------- Clinic Level of Care Assessment Details Patient Name: [name] Date of Service: 08/17/2017 2:00 PM Medical Record Number: [id number] Patient Account Number: [id number] Date of Birth/Sex: 12-13-1945 (71 y.o. Female) Treating RN: Huel Coventry Primary Care Tawney Vanorman: Quintin Alto Other Clinician: Referring Bernie Ransford: Quintin Alto Treating Shaddai Shapley/Extender: Altamese [place] in Treatment: 9 Clinic Level of Care Assessment Items TOOL 4 Quantity  Score []  - Use when only an EandM is performed on FOLLOW-UP visit 0 ASSESSMENTS - Nursing Assessment / Reassessment []  - Reassessment of Co-morbidities (includes updates in patient status) 0 X- 1 5 Reassessment of Adherence to Treatment Plan ASSESSMENTS - Wound and Skin Assessment / Reassessment []  - Simple Wound Assessment / Reassessment - one wound 0 X- 6 5 Complex Wound Assessment / Reassessment - multiple wounds []  - 0 Dermatologic / Skin Assessment (not related to wound area) ASSESSMENTS - Focused Assessment []  - Circumferential Edema Measurements - multi extremities 0 []  - 0 Nutritional Assessment / Counseling / Intervention []  - 0 Lower Extremity Assessment (monofilament, tuning fork, pulses) []  - 0 Peripheral Arterial Disease Assessment (using hand held doppler) ASSESSMENTS - Ostomy and/or Continence Assessment and Care []  - Incontinence Assessment and Management 0 []  - 0 Ostomy Care Assessment and Management (repouching, etc.) PROCESS - Coordination of Care X - Simple Patient / Family Education for ongoing care 1 15 []  - 0 Complex (extensive) Patient / Family Education for ongoing care X- 1 10 Staff obtains Chiropractor, Records, Test Results / Process Orders []  - 0 Staff telephones HHA, Nursing Homes / Clarify orders / etc []  - 0 Routine Transfer to another Facility (non-emergent condition) []  - 0 Routine Hospital Admission (non-emergent condition) []  - 0 New Admissions / Manufacturing engineer / Ordering NPWT, Apligraf, etc. []  - 0 Emergency Hospital Admission (emergent condition) X- 1 10 Simple Discharge Coordination Haslip, Amie G. ([id number]) []  - 0 Complex (extensive) Discharge Coordination PROCESS - Special Needs []  - Pediatric / Minor Patient Management 0 []  - 0 Isolation Patient Management []  - 0 Hearing / Language / Visual special needs []  - 0 Assessment of Community assistance (transportation, D/C planning, etc.) []  - 0 Additional assistance /  Altered mentation []  - 0 Support Surface(s) Assessment (bed, cushion, seat, etc.) INTERVENTIONS - Wound Cleansing / Measurement []  - Simple Wound Cleansing - one wound 0 X- 6 5 Complex Wound Cleansing - multiple wounds []  -  0 Wound Imaging (photographs - any number of wounds) X- 1 5 Wound Tracing (instead of photographs) []  - 0 Simple Wound Measurement - one wound X- 6 5 Complex Wound Measurement - multiple wounds INTERVENTIONS - Wound Dressings []  - Small Wound Dressing one or multiple wounds 0 []  - 0 Medium Wound Dressing one or multiple wounds X- 2 20 Large Wound Dressing one or multiple wounds []  - 0 Application of Medications - topical []  - 0 Application of Medications - injection INTERVENTIONS - Miscellaneous []  - External ear exam 0 []  - 0 Specimen Collection (cultures, biopsies, blood, body fluids, etc.) []  - 0 Specimen(s) / Culture(s) sent or taken to Lab for analysis []  - 0 Patient Transfer (multiple staff / Nurse, adult / Similar devices) []  - 0 Simple Staple / Suture removal (25 or less) []  - 0 Complex Staple / Suture removal (26 or more) []  - 0 Hypo / Hyperglycemic Management (close monitor of Blood Glucose) []  - 0 Ankle / Brachial Index (ABI) - do not check if billed separately X- 1 5 Vital Signs Golda, Clydie G. ([id number]) Has the patient been seen at the hospital within the last three years: Yes Total Score: 180 Level Of Care: New/Established - Level 5 Electronic Signature(s) Signed: 08/17/2017 5:24:42 PM By: Elliot Gurney, BSN, RN, CWS, Kim RN, BSN Entered By: Elliot Gurney, BSN, RN, CWS, Kim on 08/17/2017 14:08:23 Corlett, [name] ([id number]) -------------------------------------------------------------------------------- Encounter Discharge Information Details Patient Name: [name] Date of Service: 08/17/2017 2:00 PM Medical Record Number: [id number] Patient Account Number: [id number] Date of Birth/Sex: Feb 10, 1946 (71 y.o. Female) Treating RN: Huel Coventry Primary Care Nigel Ericsson: Quintin Alto Other Clinician: Referring Aurora Rody: Quintin Alto Treating Jabree Rebert/Extender: Altamese [place] in Treatment: 9 Encounter Discharge Information Items Discharge Pain Level: 0 Discharge Condition: Stable Ambulatory Status: Wheelchair Discharge Destination: Home Transportation: Private Auto Accompanied By: spouse Schedule Follow-up Appointment: Yes Medication Reconciliation completed and No provided to Patient/Care Catalaya Garr: Provided on Clinical Summary of Care: 08/17/2017 Form Type Recipient Paper Patient MD Electronic Signature(s) Signed: 08/17/2017 2:52:02 PM By: Curtis Sites Entered By: Curtis Sites on 08/17/2017 14:52:02 Matherne, [name] ([id number]) -------------------------------------------------------------------------------- Lower Extremity Assessment Details Patient Name: [name] Date of Service: 08/17/2017 2:00 PM Medical Record Number: [id number] Patient Account Number: [id number] Date of Birth/Sex: 1945/09/20 (71 y.o. Female) Treating RN: Ashok Cordia, Debi Primary Care Makia Bossi: Quintin Alto Other Clinician: Referring Nicolis Boody: Quintin Alto Treating Lissy Deuser/Extender: Altamese [place] in Treatment: 9 Vascular Assessment Pulses: Dorsalis Pedis Palpable: [Left:Yes] [Right:Yes] Posterior Tibial Extremity colors, hair growth, and conditions: Extremity Color: [Left:Normal] [Right:Normal] Temperature of Extremity: [Left:Warm] [Right:Warm] Capillary Refill: [Left:> 3 seconds] [Right:> 3 seconds] Toe Nail Assessment Left: Right: Thick: Yes Yes Discolored: Yes Yes Deformed: Yes Yes Improper Length and Hygiene: Yes Yes Electronic Signature(s) Signed: 08/19/2017 8:00:35 AM By: Alejandro Mulling Entered By: Alejandro Mulling on 08/17/2017 13:55:16 Roark, [name] ([id number]) -------------------------------------------------------------------------------- Multi Wound Chart Details Patient Name:  [name] Date of Service: 08/17/2017 2:00 PM Medical Record Number: [id number] Patient Account Number: [id number] Date of Birth/Sex: [birth year] (72 y.o. Female) Treating RN: Huel Coventry Primary Care Bani Gianfrancesco: Quintin Alto Other Clinician: Referring Summit Borchardt: Quintin Alto Treating Jaiyon Wander/Extender: Altamese [place] in Treatment: 9 Vital Signs Height(in): 63 Pulse(bpm): 86 Weight(lbs): 176 Blood Pressure(mmHg): 108/66 Body Mass Index(BMI): 31 Temperature(F): 98.1 Respiratory Rate 18 (breaths/min): Photos: [1:No Photos] [2:No Photos] [4:No Photos] Wound Location: [1:Right Toe - Web between 2nd and 3rd - Distal] [2:Right Toe - Web between 3rd and 4th] [4:Right Toe  Fifth - Dorsal] Wounding Event: [1:Gradually Appeared] [2:Pressure Injury] [4:Gradually Appeared] Primary Etiology: [1:Arterial Insufficiency Ulcer] [2:Arterial Insufficiency Ulcer] [4:Arterial Insufficiency Ulcer] Comorbid History: [1:Anemia, Arrhythmia, Congestive Heart Failure, Hypertension, Myocardial Infarction, Peripheral Arterial Disease, Neuropathy] [2:Anemia, Arrhythmia, Congestive Heart Failure, Hypertension, Myocardial Infarction, Peripheral Arterial  Disease, Neuropathy] [4:Anemia, Arrhythmia, Congestive Heart Failure, Hypertension, Myocardial Infarction, Peripheral Arterial Disease, Neuropathy] Date Acquired: [1:05/30/2017] [2:02/21/2017] [4:07/19/2017] Weeks of Treatment: [1:9] [2:8] [4:4] Wound Status: [1:Open] [2:Open] [4:Open] Clustered Wound: [1:Yes] [2:No] [4:No] Pending Amputation on [1:Yes] [2:No] [4:No] Presentation: Measurements L x W x D [1:0.5x0.5x0.1] [2:0.6x0.4x0.1] [4:0.2x0.2x0.1] (cm) Area (cm) : [1:0.196] [2:0.188] [4:0.031] Volume (cm) : [1:0.02] [2:0.019] [4:0.003] % Reduction in Area: [1:99.20%] [2:87.50%] [4:0.00%] % Reduction in Volume: [1:99.20%] [2:87.40%] [4:0.00%] Classification: [1:Full Thickness Without Exposed Support Structures] [2:Partial Thickness]  [4:Partial Thickness] Exudate Amount: [1:Large] [2:Large] [4:Large] Exudate Type: [1:Serous] [2:Serous] [4:Serous] Exudate Color: [1:amber] [2:amber] [4:amber] Foul Odor After Cleansing: [1:Yes] [2:Yes] [4:No] Odor Anticipated Due to [1:No] [2:No] [4:N/A] Product Use: Wound Margin: [1:Distinct, outline attached] [2:Distinct, outline attached] [4:Flat and Intact] Granulation Amount: [1:None Present (0%)] [2:None Present (0%)] [4:None Present (0%)] Necrotic Amount: [1:Large (67-100%)] [2:Large (67-100%)] [4:Large (67-100%)] Necrotic Tissue: [1:Adherent Slough] [2:Adherent Slough] [4:Eschar, Adherent Slough] Epithelialization: [1:None] [2:None] [4:None] Periwound Skin Texture: [1:No Abnormalities Noted] [2:No Abnormalities Noted] [4:Excoriation: No Induration: No] Callus: No Crepitus: No Rash: No Scarring: No Periwound Skin Moisture: Maceration: Yes Maceration: Yes Maceration: Yes Dry/Scaly: No Periwound Skin Color: No Abnormalities Noted No Abnormalities Noted Atrophie Blanche: No Cyanosis: No Ecchymosis: No Erythema: No Hemosiderin Staining: No Mottled: No Pallor: No Rubor: No Temperature: No Abnormality No Abnormality No Abnormality Tenderness on Palpation: Yes Yes Yes Wound Preparation: Ulcer Cleansing: Ulcer Cleansing: Ulcer Cleansing: Rinsed/Irrigated with Saline Rinsed/Irrigated with Saline Rinsed/Irrigated with Saline Topical Anesthetic Applied: Topical Anesthetic Applied: Topical Anesthetic Applied: Other: lidocaine 4% Other: lidocaine 4% Other: lidocaine 4% Wound Number: [id number]  Photos: No Photos No Photos No Photos Wound Location: Left Toe Great - Distal Left Toe Great - Lateral Left Toe Third Wounding Event: Gradually Appeared Gradually Appeared Gradually Appeared 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 Comorbid History: Anemia, Arrhythmia, Anemia, Arrhythmia, Anemia, Arrhythmia, Congestive Heart  Failure, Congestive Heart Failure, Congestive Heart Failure, Hypertension, Myocardial Hypertension, Myocardial Hypertension, Myocardial Infarction, Peripheral Arterial Infarction, Peripheral Arterial Infarction, Peripheral Arterial Disease, Neuropathy Disease, Neuropathy Disease, Neuropathy Date Acquired: 07/17/2017 07/17/2017 07/16/2017 Weeks of Treatment: 4 4 4  Wound Status: Open Open Open Clustered Wound: No No No Pending Amputation on No No No Presentation: Measurements L x W x D 4.8x4.3x0.1 1x0.6x0.1 2x1.5x0.1 (cm) Area (cm) : 16.211 0.471 2.356 Volume (cm) : 1.621 0.047 0.236 % Reduction in Area: -931.90% 84.90% -212.50% % Reduction in Volume: -10031.20% 84.90% -214.70% Classification: Partial Thickness Partial Thickness Partial Thickness Exudate Amount: Large Large Large Exudate Type: Serous Purulent Purulent Exudate Color: amber yellow, brown, green yellow, brown, green Foul Odor After Cleansing: Yes Yes Yes Odor Anticipated Due to No No No Product Use: Wound Margin: Flat and Intact Distinct, outline attached Distinct, outline attached Granulation Amount: None Present (0%) None Present (0%) None Present (0%) Necrotic Amount: Large (67-100%) Large (67-100%) Large (67-100%) Necrotic Tissue: Eschar, Adherent Slough Eschar, Adherent Slough Eschar, Adherent Slough Exposed Structures: Fascia: No Fascia: No Fascia: No Fat Layer (Subcutaneous Fat Layer (Subcutaneous Fat Layer (Subcutaneous Kann, Sianni G. ([id number]) Tissue) Exposed: No Tissue) Exposed: No Tissue) Exposed: No Tendon: No Tendon: No Tendon: No Muscle: No Muscle: No Muscle: No Joint: No Joint: No Joint: No Bone: No  Bone: No Bone: No Epithelialization: None None None Periwound Skin Texture: Induration: Yes Induration: Yes Callus: Yes Callus: Yes Callus: Yes Excoriation: No Scarring: Yes Crepitus: Yes Induration: No Excoriation: No Excoriation: No Crepitus: No Crepitus: No Rash: No Rash:  No Rash: No Scarring: No Scarring: No Periwound Skin Moisture: Dry/Scaly: Yes Maceration: Yes Maceration: No Maceration: No Dry/Scaly: No Dry/Scaly: No Periwound Skin Color: Atrophie Blanche: No Atrophie Blanche: No Atrophie Blanche: No Cyanosis: No Cyanosis: No Cyanosis: No Ecchymosis: No Ecchymosis: No Ecchymosis: No Erythema: No Erythema: No Erythema: No Hemosiderin Staining: No Hemosiderin Staining: No Hemosiderin Staining: No Mottled: No Mottled: No Mottled: No Pallor: No Pallor: No Pallor: No Rubor: No Rubor: No Rubor: No Temperature: No Abnormality No Abnormality No Abnormality Tenderness on Palpation: Yes Yes Yes Wound Preparation: Ulcer Cleansing: Ulcer Cleansing: Ulcer Cleansing: Rinsed/Irrigated with Saline Rinsed/Irrigated with Saline Rinsed/Irrigated with Saline Topical Anesthetic Applied: Topical Anesthetic Applied: Topical Anesthetic Applied: Other: lidocaine 4% Other: lidocaine 4% Other: lidocaine 4% Treatment Notes Electronic Signature(s) Signed: 08/17/2017 5:24:42 PM By: Elliot Gurney, BSN, RN, CWS, Kim RN, BSN Entered By: Elliot Gurney, BSN, RN, CWS, Kim on 08/17/2017 14:03:01 Roediger, [name] ([id number]) -------------------------------------------------------------------------------- Multi-Disciplinary Care Plan Details Patient Name: [name] Date of Service: 08/17/2017 2:00 PM Medical Record Number: [id number] Patient Account Number: [id number] Date of Birth/Sex: 1946-04-05 (71 y.o. Female) Treating RN: Huel Coventry Primary Care Jasmine Maceachern: Quintin Alto Other Clinician: Referring Shaquel Chavous: Quintin Alto Treating Joli Koob/Extender: Altamese [place] in Treatment: 9 Active Inactive ` Abuse / Safety / Falls / Self Care Management Nursing Diagnoses: History of Falls Impaired home maintenance Potential for falls Goals: Patient will not experience any injury related to falls Date Initiated: 06/13/2017 Target Resolution Date:  07/14/2017 Goal Status: Active Patient will remain injury free related to falls Date Initiated: 06/13/2017 Target Resolution Date: 07/14/2017 Goal Status: Active Interventions: Assess fall risk on admission and as needed Treatment Activities: Patient referred to home care : 06/13/2017 Notes: ` Necrotic Tissue Nursing Diagnoses: Impaired tissue integrity related to necrotic/devitalized tissue Goals: Necrotic/devitalized tissue will be minimized in the wound bed Date Initiated: 06/13/2017 Target Resolution Date: 07/14/2017 Goal Status: Active Interventions: Assess patient pain level pre-, during and post procedure and prior to discharge Provide education on necrotic tissue and debridement process Treatment Activities: Apply topical anesthetic as ordered : 06/13/2017 Notes: Breuer, [name] ([id number]) ` Orientation to the Wound Care Program Nursing Diagnoses: Knowledge deficit related to the wound healing center program Goals: Patient/caregiver will verbalize understanding of the Wound Healing Center Program Date Initiated: 06/13/2017 Target Resolution Date: 07/14/2017 Goal Status: Active Interventions: Provide education on orientation to the wound center Notes: ` Wound/Skin Impairment Nursing Diagnoses: Impaired tissue integrity Knowledge deficit related to ulceration/compromised skin integrity Goals: Ulcer/skin breakdown will have a volume reduction of 30% by week 4 Date Initiated: 06/13/2017 Target Resolution Date: 07/14/2017 Goal Status: Active Interventions: Assess ulceration(s) every visit Treatment Activities: Patient referred to home care : 06/13/2017 Topical wound management initiated : 06/13/2017 Notes: Electronic Signature(s) Signed: 08/17/2017 5:24:42 PM By: Elliot Gurney, BSN, RN, CWS, Kim RN, BSN Entered By: Elliot Gurney, BSN, RN, CWS, Kim on 08/17/2017 14:02:51 Crutchfield, [name]  ([id number]) -------------------------------------------------------------------------------- Pain Assessment Details Patient Name: [name] Date of Service: 08/17/2017 2:00 PM Medical Record Number: [id number] Patient Account Number: [id number] Date of Birth/Sex: 08-12-1945 (71 y.o. Female) Treating RN: Ashok Cordia, Debi Primary Care Brenisha Tsui: Quintin Alto Other Clinician: Referring Billyjoe Go: Quintin Alto Treating Lamark Schue/Extender: Altamese [place] in Treatment: 9 Active Problems Location of Pain Severity and  Description of Pain Patient Has Paino No Site Locations Pain Management and Medication Current Pain Management: Electronic Signature(s) Signed: 08/19/2017 8:00:35 AM By: Alejandro MullingPinkerton, Debra Entered By: Alejandro MullingPinkerton, Debra on 08/17/2017 13:42:04 Cacho, Don PerkingMARY G. ([id number]) -------------------------------------------------------------------------------- Patient/Caregiver Education Details Patient Name: Jolyne LoaEFALCO, Evelena G. Date of Service: 08/17/2017 2:00 PM Medical Record Number: [id number] Patient Account Number: [id number] Date of Birth/Gender: [birth year] (71 y.o. Female) Treating RN: Curtis Sitesorthy, Joanna Primary Care Physician: Quintin AltoBURDINE, STEVEN Other Clinician: Referring Physician: Quintin AltoBURDINE, STEVEN Treating Physician/Extender: Altamese CarolinaOBSON, MICHAEL G Weeks in Treatment: 9 Education Assessment Education Provided To: Patient Education Topics Provided Wound/Skin Impairment: Handouts: Other: wound care as ordered Methods: Demonstration, Explain/Verbal Responses: State content correctly Electronic Signature(s) Signed: 08/17/2017 4:58:37 PM By: Curtis Sitesorthy, Joanna Entered By: Curtis Sitesorthy, Joanna on 08/17/2017 14:52:19 Erby, Don PerkingMARY G. ([id number]) -------------------------------------------------------------------------------- Wound Assessment Details Patient Name: Jolyne LoaEFALCO, Von G. Date of Service: 08/17/2017 2:00 PM Medical Record Number: [id number] Patient Account Number:  [id number] Date of Birth/Sex: [birth year](71 y.o. Female) Treating RN: Ashok CordiaPinkerton, Debi Primary Care Shaya Reddick: Quintin AltoBURDINE, STEVEN Other Clinician: Referring Kedric Bumgarner: Quintin AltoBURDINE, STEVEN Treating Shalin Linders/Extender: Altamese CarolinaOBSON, MICHAEL G Weeks in Treatment: 9 Wound Status Wound Number: 1 Primary Arterial Insufficiency Ulcer Etiology: Wound Location: Right Toe - Web between 2nd and 3rd - Distal Wound Open Status: Wounding Event: Gradually Appeared Comorbid Anemia, Arrhythmia, Congestive Heart Failure, Date Acquired: 05/30/2017 History: Hypertension, Myocardial Infarction, Peripheral Weeks Of Treatment: 9 Arterial Disease, Neuropathy Clustered Wound: Yes Pending Amputation On Presentation Photos Photo Uploaded By: Alejandro MullingPinkerton, Debra on 08/17/2017 15:31:03 Wound Measurements Length: (cm) 0.5 Width: (cm) 0.5 Depth: (cm) 0.1 Area: (cm) 0.196 Volume: (cm) 0.02 % Reduction in Area: 99.2% % Reduction in Volume: 99.2% Epithelialization: None Tunneling: No Undermining: No Wound Description Full Thickness Without Exposed Support Classification: Structures Wound Margin: Distinct, outline attached Exudate Large Amount: Exudate Type: Serous Exudate Color: amber Foul Odor After Cleansing: Yes Due to Product Use: No Slough/Fibrino Yes Wound Bed Granulation Amount: None Present (0%) Necrotic Amount: Large (67-100%) Necrotic Quality: Adherent Slough Periwound Skin Texture Texture Color Trupiano, Nocole G. ([id number]) No Abnormalities Noted: No No Abnormalities Noted: No Moisture Temperature / Pain No Abnormalities Noted: No Temperature: No Abnormality Maceration: Yes Tenderness on Palpation: Yes Wound Preparation Ulcer Cleansing: Rinsed/Irrigated with Saline Topical Anesthetic Applied: Other: lidocaine 4%, Treatment Notes Wound #1 (Right, Distal Toe - Web between 2nd and 3rd) 1. Cleansed with: Clean wound with Normal Saline 2. Anesthetic Topical Lidocaine 4% cream to wound bed  prior to debridement 4. Dressing Applied: Other dressing (specify in notes) 5. Secondary Dressing Applied ABD Pad Kerlix/Conform 7. Secured with Tape Notes silvercel, iodine Electronic Signature(s) Signed: 08/19/2017 8:00:35 AM By: Alejandro MullingPinkerton, Debra Entered By: Alejandro MullingPinkerton, Debra on 08/17/2017 13:49:34 Finklea, Don PerkingMARY G. ([id number]) -------------------------------------------------------------------------------- Wound Assessment Details Patient Name: Jolyne LoaEFALCO, Kadee G. Date of Service: 08/17/2017 2:00 PM Medical Record Number: [id number] Patient Account Number: [id number] Date of Birth/Sex: [birth year](71 y.o. Female) Treating RN: Ashok CordiaPinkerton, Debi Primary Care Adrianah Prophete: Quintin AltoBURDINE, STEVEN Other Clinician: Referring Watt Geiler: Quintin AltoBURDINE, STEVEN Treating Leah Skora/Extender: Altamese CarolinaOBSON, MICHAEL G Weeks in Treatment: 9 Wound Status Wound Number: 2 Primary Arterial Insufficiency Ulcer Etiology: Wound Location: Right Toe - Web between 3rd and 4th Wound Open Wounding Event: Pressure Injury Status: Date Acquired: 02/21/2017 Comorbid Anemia, Arrhythmia, Congestive Heart Failure, Weeks Of Treatment: 8 History: Hypertension, Myocardial Infarction, Peripheral Clustered Wound: No Arterial Disease, Neuropathy Photos Photo Uploaded By: Alejandro MullingPinkerton, Debra on 08/17/2017 15:31:04 Wound Measurements Length: (cm) 0.6 Width: (cm) 0.4 Depth: (cm) 0.1 Area: (cm) 0.188 Volume: (cm) 0.019 % Reduction in Area: 87.5% % Reduction in  Volume: 87.4% Epithelialization: None Tunneling: No Undermining: No Wound Description Classification: Partial Thickness Wound Margin: Distinct, outline attached Exudate Amount: Large Exudate Type: Serous Exudate Color: amber Foul Odor After Cleansing: Yes Due to Product Use: No Slough/Fibrino Yes Wound Bed Granulation Amount: None Present (0%) Necrotic Amount: Large (67-100%) Necrotic Quality: Adherent Slough Periwound Skin Texture Texture Color No Abnormalities Noted:  No No Abnormalities Noted: No Moisture Temperature / Pain No Abnormalities Noted: No Temperature: No Abnormality Colombe, Lenell G. ([id number]) Maceration: Yes Tenderness on Palpation: Yes Wound Preparation Ulcer Cleansing: Rinsed/Irrigated with Saline Topical Anesthetic Applied: Other: lidocaine 4%, Treatment Notes Wound #2 (Right Toe - Web between 3rd and 4th) 1. Cleansed with: Clean wound with Normal Saline 2. Anesthetic Topical Lidocaine 4% cream to wound bed prior to debridement 4. Dressing Applied: Other dressing (specify in notes) 5. Secondary Dressing Applied ABD Pad Kerlix/Conform 7. Secured with Tape Notes silvercel, iodine Electronic Signature(s) Signed: 08/19/2017 8:00:35 AM By: Alejandro Mulling Entered By: Alejandro Mulling on 08/17/2017 13:50:18 Cariker, [name] ([id number]) -------------------------------------------------------------------------------- Wound Assessment Details Patient Name: [name] Date of Service: 08/17/2017 2:00 PM Medical Record Number: [id number] Patient Account Number: [id number] Date of Birth/Sex: 1945/11/04 (72 y.o. Female) Treating RN: Ashok Cordia, Debi Primary Care Daymein Nunnery: Quintin Alto Other Clinician: Referring Caelan Atchley: Quintin Alto Treating Brylan Dec/Extender: Altamese [place] in Treatment: 9 Wound Status Wound Number: 4 Primary Arterial Insufficiency Ulcer Etiology: Wound Location: Right Toe Fifth - Dorsal Wound Open Wounding Event: Gradually Appeared Status: Date Acquired: 07/19/2017 Comorbid Anemia, Arrhythmia, Congestive Heart Failure, Weeks Of Treatment: 4 History: Hypertension, Myocardial Infarction, Peripheral Clustered Wound: No Arterial Disease, Neuropathy Photos Photo Uploaded By: Alejandro Mulling on 08/17/2017 15:33:03 Wound Measurements Length: (cm) 0.2 Width: (cm) 0.2 Depth: (cm) 0.1 Area: (cm) 0.031 Volume: (cm) 0.003 % Reduction in Area: 0% % Reduction in Volume:  0% Epithelialization: None Tunneling: No Undermining: No Wound Description Classification: Partial Thickness Wound Margin: Flat and Intact Exudate Amount: Large Exudate Type: Serous Exudate Color: amber Foul Odor After Cleansing: No Slough/Fibrino Yes Wound Bed Granulation Amount: None Present (0%) Exposed Structure Necrotic Amount: Large (67-100%) Fascia Exposed: No Necrotic Quality: Eschar, Adherent Slough Fat Layer (Subcutaneous Tissue) Exposed: No Tendon Exposed: No Muscle Exposed: No Joint Exposed: No Bone Exposed: No Periwound Skin Texture Velardi, Eadie G. ([id number]) Texture Color No Abnormalities Noted: No No Abnormalities Noted: No Callus: No Atrophie Blanche: No Crepitus: No Cyanosis: No Excoriation: No Ecchymosis: No Induration: No Erythema: No Rash: No Hemosiderin Staining: No Scarring: No Mottled: No Pallor: No Moisture Rubor: No No Abnormalities Noted: No Dry / Scaly: No Temperature / Pain Maceration: Yes Temperature: No Abnormality Tenderness on Palpation: Yes Wound Preparation Ulcer Cleansing: Rinsed/Irrigated with Saline Topical Anesthetic Applied: Other: lidocaine 4%, Treatment Notes Wound #4 (Right, Dorsal Toe Fifth) 1. Cleansed with: Clean wound with Normal Saline 2. Anesthetic Topical Lidocaine 4% cream to wound bed prior to debridement 4. Dressing Applied: Other dressing (specify in notes) 5. Secondary Dressing Applied ABD Pad Kerlix/Conform 7. Secured with Tape Notes silvercel, iodine Electronic Signature(s) Signed: 08/19/2017 8:00:35 AM By: Alejandro Mulling Entered By: Alejandro Mulling on 08/17/2017 13:51:41 Kilgore, [name] ([id number]) -------------------------------------------------------------------------------- Wound Assessment Details Patient Name: [name] Date of Service: 08/17/2017 2:00 PM Medical Record Number: [id number] Patient Account Number: [id number] Date of Birth/Sex: 12-30-1945 (72 y.o.  Female) Treating RN: Ashok Cordia, Debi Primary Care Deyvi Bonanno: Quintin Alto Other Clinician: Referring Veida Spira: Quintin Alto Treating Bennett Vanscyoc/Extender: Maxwell Caul Weeks in Treatment: 9 Wound Status Wound Number: 5 Primary Arterial Insufficiency  Ulcer Etiology: Wound Location: Left Toe Great - Distal Wound Open Wounding Event: Gradually Appeared Status: Date Acquired: 07/17/2017 Comorbid Anemia, Arrhythmia, Congestive Heart Failure, Weeks Of Treatment: 4 History: Hypertension, Myocardial Infarction, Peripheral Clustered Wound: No Arterial Disease, Neuropathy Photos Photo Uploaded By: Alejandro Mulling on 08/17/2017 15:33:03 Wound Measurements Length: (cm) 4.8 Width: (cm) 4.3 Depth: (cm) 0.1 Area: (cm) 16.211 Volume: (cm) 1.621 % Reduction in Area: -931.9% % Reduction in Volume: -10031.2% Epithelialization: None Wound Description Classification: Partial Thickness Wound Margin: Flat and Intact Exudate Amount: Large Exudate Type: Serous Exudate Color: amber Foul Odor After Cleansing: Yes Due to Product Use: No Slough/Fibrino Yes Wound Bed Granulation Amount: None Present (0%) Exposed Structure Necrotic Amount: Large (67-100%) Fascia Exposed: No Necrotic Quality: Eschar, Adherent Slough Fat Layer (Subcutaneous Tissue) Exposed: No Tendon Exposed: No Muscle Exposed: No Joint Exposed: No Bone Exposed: No Periwound Skin Texture Havey, Lamisha G. ([id number]) Texture Color No Abnormalities Noted: No No Abnormalities Noted: No Callus: Yes Atrophie Blanche: No Crepitus: No Cyanosis: No Excoriation: No Ecchymosis: No Induration: Yes Erythema: No Rash: No Hemosiderin Staining: No Scarring: Yes Mottled: No Pallor: No Moisture Rubor: No No Abnormalities Noted: No Dry / Scaly: Yes Temperature / Pain Maceration: No Temperature: No Abnormality Tenderness on Palpation: Yes Wound Preparation Ulcer Cleansing: Rinsed/Irrigated with Saline Topical  Anesthetic Applied: Other: lidocaine 4%, Treatment Notes Wound #5 (Left, Distal Toe Great) 1. Cleansed with: Clean wound with Normal Saline 2. Anesthetic Topical Lidocaine 4% cream to wound bed prior to debridement 4. Dressing Applied: Other dressing (specify in notes) 5. Secondary Dressing Applied ABD Pad Kerlix/Conform 7. Secured with Tape Notes silvercel, iodine Electronic Signature(s) Signed: 08/19/2017 8:00:35 AM By: Alejandro Mulling Entered By: Alejandro Mulling on 08/17/2017 13:52:40 Grondahl, [name] ([id number]) -------------------------------------------------------------------------------- Wound Assessment Details Patient Name: [name] Date of Service: 08/17/2017 2:00 PM Medical Record Number: [id number] Patient Account Number: [id number] Date of Birth/Sex: July 24, 1945 (72 y.o. Female) Treating RN: Ashok Cordia, Debi Primary Care Bennetta Rudden: Quintin Alto Other Clinician: Referring Ygnacio Fecteau: Quintin Alto Treating Eh Sesay/Extender: [employer] [place] in Treatment: 9 Wound Status Wound Number: 6 Primary Arterial Insufficiency Ulcer Etiology: Wound Location: Left Toe Great - Lateral Wound Open Wounding Event: Gradually Appeared Status: Date Acquired: 07/17/2017 Comorbid Anemia, Arrhythmia, Congestive Heart Failure, Weeks Of Treatment: 4 History: Hypertension, Myocardial Infarction, Peripheral Clustered Wound: No Arterial Disease, Neuropathy Photos Photo Uploaded By: Alejandro Mulling on 08/17/2017 15:33:41 Wound Measurements Length: (cm) 1 Width: (cm) 0.6 Depth: (cm) 0.1 Area: (cm) 0.471 Volume: (cm) 0.047 % Reduction in Area: 84.9% % Reduction in Volume: 84.9% Epithelialization: None Tunneling: No Undermining: No Wound Description Classification: Partial Thickness Wound Margin: Distinct, outline attached Exudate Amount: Large Exudate Type: Purulent Exudate Color: yellow, brown, green Foul Odor After Cleansing: Yes Due to Product Use:  No Slough/Fibrino Yes Wound Bed Granulation Amount: None Present (0%) Exposed Structure Necrotic Amount: Large (67-100%) Fascia Exposed: No Necrotic Quality: Eschar, Adherent Slough Fat Layer (Subcutaneous Tissue) Exposed: No Tendon Exposed: No Muscle Exposed: No Joint Exposed: No Bone Exposed: No Periwound Skin Texture Blakeney, Tallie G. ([id number]) Texture Color No Abnormalities Noted: No No Abnormalities Noted: No Callus: Yes Atrophie Blanche: No Crepitus: Yes Cyanosis: No Excoriation: No Ecchymosis: No Induration: Yes Erythema: No Rash: No Hemosiderin Staining: No Scarring: No Mottled: No Pallor: No Moisture Rubor: No No Abnormalities Noted: No Dry / Scaly: No Temperature / Pain Maceration: Yes Temperature: No Abnormality Tenderness on Palpation: Yes Wound Preparation Ulcer Cleansing: Rinsed/Irrigated with Saline Topical Anesthetic Applied: Other: lidocaine 4%, Treatment Notes  Wound #6 (Left, Lateral Toe Great) 1. Cleansed with: Clean wound with Normal Saline 2. Anesthetic Topical Lidocaine 4% cream to wound bed prior to debridement 4. Dressing Applied: Other dressing (specify in notes) 5. Secondary Dressing Applied ABD Pad Kerlix/Conform 7. Secured with Tape Notes silvercel, iodine Electronic Signature(s) Signed: 08/19/2017 8:00:35 AM By: Alejandro Mulling Entered By: Alejandro Mulling on 08/17/2017 13:53:36 Omara, [name] ([id number]) -------------------------------------------------------------------------------- Wound Assessment Details Patient Name: [name] Date of Service: 08/17/2017 2:00 PM Medical Record Number: [id number] Patient Account Number: [id number] Date of Birth/Sex: 08-25-1945 (72 y.o. Female) Treating RN: Ashok Cordia, Debi Primary Care Ashlin Hidalgo: Quintin Alto Other Clinician: Referring Kinya Meine: Quintin Alto Treating Tinita Brooker/Extender: Altamese [place] in Treatment: 9 Wound Status Wound Number: 7 Primary  Arterial Insufficiency Ulcer Etiology: Wound Location: Left Toe Third Wound Open Wounding Event: Gradually Appeared Status: Date Acquired: 07/16/2017 Comorbid Anemia, Arrhythmia, Congestive Heart Failure, Weeks Of Treatment: 4 History: Hypertension, Myocardial Infarction, Peripheral Clustered Wound: No Arterial Disease, Neuropathy Photos Photo Uploaded By: Alejandro Mulling on 08/17/2017 15:33:41 Wound Measurements Length: (cm) 2 Width: (cm) 1.5 Depth: (cm) 0.1 Area: (cm) 2.356 Volume: (cm) 0.236 % Reduction in Area: -212.5% % Reduction in Volume: -214.7% Epithelialization: None Tunneling: No Undermining: No Wound Description Classification: Partial Thickness Wound Margin: Distinct, outline attached Exudate Amount: Large Exudate Type: Purulent Exudate Color: yellow, brown, green Foul Odor After Cleansing: Yes Due to Product Use: No Slough/Fibrino Yes Wound Bed Granulation Amount: None Present (0%) Exposed Structure Necrotic Amount: Large (67-100%) Fascia Exposed: No Necrotic Quality: Eschar, Adherent Slough Fat Layer (Subcutaneous Tissue) Exposed: No Tendon Exposed: No Muscle Exposed: No Joint Exposed: No Bone Exposed: No Periwound Skin Texture Husband, Alexi G. ([id number]) Texture Color No Abnormalities Noted: No No Abnormalities Noted: No Callus: Yes Atrophie Blanche: No Crepitus: No Cyanosis: No Excoriation: No Ecchymosis: No Induration: No Erythema: No Rash: No Hemosiderin Staining: No Scarring: No Mottled: No Pallor: No Moisture Rubor: No No Abnormalities Noted: No Dry / Scaly: No Temperature / Pain Maceration: No Temperature: No Abnormality Tenderness on Palpation: Yes Wound Preparation Ulcer Cleansing: Rinsed/Irrigated with Saline Topical Anesthetic Applied: Other: lidocaine 4%, Treatment Notes Wound #7 (Left Toe Third) 1. Cleansed with: Clean wound with Normal Saline 2. Anesthetic Topical Lidocaine 4% cream to wound bed prior to  debridement 4. Dressing Applied: Other dressing (specify in notes) 5. Secondary Dressing Applied ABD Pad Kerlix/Conform 7. Secured with Tape Notes silvercel, iodine Electronic Signature(s) Signed: 08/19/2017 8:00:35 AM By: Alejandro Mulling Entered By: Alejandro Mulling on 08/17/2017 13:54:23 Scharnhorst, [name] ([id number]) -------------------------------------------------------------------------------- Vitals Details Patient Name: [name] Date of Service: 08/17/2017 2:00 PM Medical Record Number: [id number] Patient Account Number: [id number] Date of Birth/Sex: 12/11/1945 (72 y.o. Female) Treating RN: Ashok Cordia, Debi Primary Care Alvie Fowles: Quintin Alto Other Clinician: Referring Brason Berthelot: Quintin Alto Treating Merced Brougham/Extender: [place] in Treatment: 9 Vital Signs Time Taken: 13:42 Temperature (F): 98.1 Height (in): 63 Pulse (bpm): 86 Weight (lbs): 176 Respiratory Rate (breaths/min): 18 Body Mass Index (BMI): 31.2 Blood Pressure (mmHg): 108/66 Reference Range: 80 - 120 mg / dl Electronic Signature(s) Signed: 08/19/2017 8:00:35 AM By: Alejandro Mulling Entered By: Alejandro Mulling on 08/17/2017 13:43:32

## 2017-08-24 ENCOUNTER — Encounter: Payer: Medicare Other | Admitting: Internal Medicine

## 2017-08-24 DIAGNOSIS — E11621 Type 2 diabetes mellitus with foot ulcer: Secondary | ICD-10-CM | POA: Diagnosis not present

## 2017-08-25 NOTE — Progress Notes (Signed)
[name], [name] ([id number]) Visit Report for 08/24/2017 HPI Details Patient Name: [name], [name] Date of Service: 08/24/2017 3:15 PM Medical Record Number: [id number] Patient Account Number: [id number] Date of Birth/Sex: 05-25-1946 (72 y.o. Female) Treating RN: Huel Coventry Primary Care Provider: Quintin Alto Other Clinician: Referring Provider: Quintin Alto Treating Provider/Extender: Altamese [place] in Treatment: 10 History of Present Illness Location: right foot toes Quality: Patient reports experiencing a sharp pain to affected area(s). Severity: Patient states wound are getting worse. Duration: Patient has had the wound for < 2 weeks prior to presenting for treatment Timing: Pain in wound is constant (hurts all the time) Context: The wound appeared gradually over time Modifying Factors: Other treatment(s) tried include:recent interventional process to place a stent in her right lower extremity for critical limb ischemia Associated Signs and Symptoms: Patient reports having foul odor. HPI Description: 72 year old female who was referred by Dr. Nanetta Batty, who performed a endovascular procedure on 05/26/2017, for critical limb ischemia. Her angiogram showed a 99% below the knee popliteal stenosis which is stented with a 4 mm drug eluting stent. On 06/10/2017 he noted that her pulses are palpable and her Dopplers have normalized. He referred her to wound care for further advice and put her on Keflex 500 mg twice daily for 10 days. She has a past medical history of asthma, coronary artery disease, CHF, hyperlipidemia, ischemic cardiomyopathy, peripheral arterial disease and type 2 diabetes mellitus. She is status post recent abdominal aortogram with right lower extremity stenting, cholecystectomy, CABG, parathyroidectomy, thyroidectomy. She is a former smoker and quit in 1988. She had an ABI performed on 06/10/2017 with the right ABI within normal range and the left ABI  shows mild lower extremity arterial disease. The right ABI was 1.0 and biphasic and the left ABI was 0.93 and biphasic. Toe pressures were abnormal with the right being 0.27 and left being 0.31. the duplex study done showed a patent right popliteal artery stent without evidence of focal stenosis or obstruction. of note the patient started having signs of critical limb ischemia somewhere at the end of November and after an urgent workup was taken up for limb salvage with a successfully placed stent by Dr. Nanetta Batty, with good arterial perfusion postprocedure with normal ABIs noted on 06/10/2017. 06/22/17; patient was admitted to our clinic last week by Dr. Meyer Russel.she is a type II diabetic. She had undergone a right popliteal DES on 11/29 for critical limb ischemia including erythema and pain of her right forefoot. She states about 3 days later she started developing ulcerations in predominantly the right foot but also the left third toe. 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 This is never really healed rib. Repeat ABIs on 12/14 showed a right ABI of 1 and a left ABI of 0.93 with toe pressures be markedly abnormal as noted above and Dr. Marcie Bal notes. The patient is still having a lot of pain making it very difficult for her to function at home this is worse on the right greater than left foot she does describe severe lancinating pain. She does not ambulate all that much and she finds it intolerable to wear footwear Her history is also notable for A. fib, severe cardiomyopathy with an ejection fraction of 25%_30%. Last echocardiogram in June of this year. According to Epic she is on a combination of Eliquis plavix and aspirin. 06/29/17; patient has an appointment with Dr. Imogene Burn of vascular surgery I believe  next week. This was arranged by her primary physician. She is still having a lot of pain in the right side in general most of her toes look  better however. Supposed to be using silver alginate however [facility] applying currently applied wet to dry dressings. 07/06/17; the patient went for appointment with Dr. Imogene Burn. She is status post right popliteal stenting with peroneal artery angioplasty with Dr. Gery Pray. She presented to our clinic with bilateral lower extremity wounds in her toes which are exceptionally painful. Dr. Imogene Burn put her on a course of antibiotics. Her arterial studies from 06/10/17 showed an ABI in the right of 1 and ABI in the left of 0.8. Waveforms at the dorsalis pedis and posterior tibial were biphasic. However routine ABIs were [name], [name] ([id number]) 0.31 bilaterally. The patient has severe bilateral rest pain. 07/20/17; the patient had her arteriogram by Dr. Imogene Burn on 07/14/17. Based on the images the patient was not felt to require any interventions and no further interventions were possible due to small size of the tibial arteries. The posterior tibial arteries bilaterally were occluded heel arteries were the dominant runoff which were miniscule. Anterior tibials both showed subtotal occlusion in the mid segment and these were small vessels. Paradoxically the patient complains of a lot less pain in the right foot however she is having a lot of pain in the left first and left third toes. She is managing this with activity limitation and narcotics. 07/27/17; I've spoken to Dr. Allyson Sabal today and he is going to do another arteriogram on the left leg early next week with possibility of retrograde arterial intervention. The patient is still having a lot of pain in the left third toe and left first toe which is worse when she puts her leg up i.e. lies in bed etc. 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 She has done better on the right which is the side where she had the DES stent placed in the right popliteal. She still has a wound on the medial aspect of the third toe and black eschar at the tip of the fourth  and fifth toes however most of the rest of the right foot looks better. 08/10/17; the patient was taken for angiography on 08/01/17; she had a successful left peroneal PTA and drug eluding stent now has a widely patent peroneal supply collateral to the distal posterior tibial that feeds the dorsal pedal arch. The patient is on antiplatelet therapy. Unfortunately she has not had much relief of pain in the left great and third toe. She has had an increase in her hydrocodone by her primary physician. She had follow-up noninvasive Doppler test done yesterday although I cannot pull up these results 08/17/17; patient returns to clinic. She states she feels better. She's had an increase in her hydrocodone to 10/325 she is taking a half a tablet every 3 hours and occasionally a whole one at night which allows her to sleep through the night. She is not having pain in the right foot but is having pain in the left. She went to see Dr. Allyson Sabal yesterday. He noted that he performed perineal interventions on 2//19 with 2 overlapping drug-eluting stents for perineal CTO. Her left posterior tibial is occluded as was her left anterior tibial at the level of the ankle. Recent Doppler showed patent peroneal stent. He takes it me yesterday to report the patient was in less pain which she seems to verify although this may be because of an increase  in strength and frequency of her narcotics culture the drainage I did last week showed both methicillin sensitive staph aureus and Pseudomonas. I sent in ciprofloxacin I believe for 10 days which should cover both of these. This may be partially why the pain in the right great toe is better 08/24/17; the patient is generally better. Especially the areas on the right foot where there are still some ischemic change on the right third and right fifth toes but her foot is warm here and I don't think is contributing much to her pain. She still has considerable dry gangrene on the right  great toe with a open area medially. Also the left third toe. Things are still very tender here but considerably better. She doesn't have any purulent drainage or cellulitis and she is finished the antibiotics I gave her. She arrived in clinic with a low blood pressure she is having some chest pain Electronic Signature(s) Signed: 08/24/2017 5:07:14 PM By: Baltazar Najjar MD Entered By: Baltazar Najjar on 08/24/2017 16:27:13 Waltrip, [name] ([id number]) -------------------------------------------------------------------------------- Physical Exam Details Patient Name: [name] Date of Service: 08/24/2017 3:15 PM Medical Record Number: [id number] Patient Account Number: [id number] Date of Birth/Sex: [birth year] (71 y.o. Female) Treating RN: Huel Coventry Primary Care Provider: Quintin Alto Other Clinician: Referring Provider: Quintin Alto Treating Provider/Extender: Altamese [place] in Treatment: 10 Constitutional Patient is hypotensive.. Pulse regular and within target range for patient. She reports being in atrial flutter and atrial fibrillation but not currently. Respirations regular, non-labored and within target range.. Temperature is normal and within the target range for the patient.. Somewhat pale looking but otherwise normal.. Eyes Conjunctivae clear. No discharge. Ears, Nose, Mouth, and Throat Mucous membranes are moist. Respiratory Respiratory effort is easy and symmetric bilaterally. Rate is normal at rest and on room air.. Bilateral breath sounds are clear and equal in all lobes with no wheezes, rales or rhonchi.. Cardiovascular patient does not appear to be dehydrated. Pulse feels regular. soft apical murmer. No S3. Pedal pulses absent bilaterally.. No edema and no evidence of DVT. Integumentary (Hair, Skin) Skin and subcutaneous tissue without rashes, lesions, discoloration or ulcers. No evidence of fungal disease. Hair and skin color texture normal and  healthy in appearance.Marland [facility] No evidence of depression, anxiety, or agitation. Calm, cooperative, and communicative. Appropriate interactions and affect.. Notes Wound exam; the left foot is dry gangrene at the tip of the first toe there is an open area medially which is where I'm cultured the organisms from 2 weeks ago this is still open I would like to add silver alginate to the bed again. The left third toe looks better. oOn the right foot she has scant amount of dry eschar over the fourth and fifth toes her forefoot feels warmer today. Electronic Signature(s) Signed: 08/24/2017 5:07:14 PM By: Baltazar Najjar MD Entered By: Baltazar Najjar on 08/24/2017 16:37:13 Winslow, [name] ([id number]) -------------------------------------------------------------------------------- Physician Orders Details Patient Name: [name] Date of Service: 08/24/2017 3:15 PM Medical Record Number: [id number] Patient Account Number: [id number] Date of Birth/Sex: 1945-09-26 (71 y.o. Female) Treating RN: Huel Coventry Primary Care Provider: Quintin Alto Other Clinician: Referring Provider: Quintin Alto Treating Provider/Extender: Altamese [place] in Treatment: 10 Verbal / Phone Orders: No Diagnosis Coding Wound Cleansing Wound #1 Right,Distal Toe - Web between 2nd and 3rd o Clean wound with Normal Saline. o May Shower, gently pat wound dry prior to applying new dressing. Wound #2 Right Toe - Web between 3rd and 4th o Clean  wound with Normal Saline. o May Shower, gently pat wound dry prior to applying new dressing. Wound #4 Right,Dorsal Toe Fifth o Clean wound with Normal Saline. o May Shower, gently pat wound dry prior to applying new dressing. Wound #5 Left,Distal Toe Great o Clean wound with Normal Saline. o May Shower, gently pat wound dry prior to applying new dressing. Wound #6 Left,Lateral Toe Great o Clean wound with Normal Saline. o May Shower, gently  pat wound dry prior to applying new dressing. Wound #7 Left Toe Third o Clean wound with Normal Saline. o May Shower, gently pat wound dry prior to applying new dressing. Anesthetic (add to Medication List) Wound #1 Right,Distal Toe - Web between 2nd and 3rd o Topical Lidocaine 4% cream applied to wound bed prior to debridement (In Clinic Only). Wound #2 Right Toe - Web between 3rd and 4th o Topical Lidocaine 4% cream applied to wound bed prior to debridement (In Clinic Only). Wound #4 Right,Dorsal Toe Fifth o Topical Lidocaine 4% cream applied to wound bed prior to debridement (In Clinic Only). Wound #5 Left,Distal Toe Great o Topical Lidocaine 4% cream applied to wound bed prior to debridement (In Clinic Only). Wound #6 Left,Lateral Toe Great o Topical Lidocaine 4% cream applied to wound bed prior to debridement (In Clinic Only). Wound #7 Left Toe Third o Topical Lidocaine 4% cream applied to wound bed prior to debridement (In Clinic Only). Primary Wound Dressing Echevarria, Camrie G. ([id number]) Wound #1 Right,Distal Toe - Web between 2nd and 3rd o Other: - betadine, silvercell toes separated Wound #2 Right Toe - Web between 3rd and 4th o Other: - betadine, silvercell toes separated Wound #4 Right,Dorsal Toe Fifth o Other: - betadine, silvercell toes separated Wound #5 Left,Distal Toe Great o Other: - betadine, silvercell toes separated Wound #6 Left,Lateral Toe Great o Other: - betadine, silvercell toes separated Wound #7 Left Toe Third o Other: - betadine, silvercell toes separated Secondary Dressing Wound #1 Right,Distal Toe - Web between 2nd and 3rd o ABD and Kerlix/Conform Wound #2 Right Toe - Web between 3rd and 4th o ABD and Kerlix/Conform Wound #4 Right,Dorsal Toe Fifth o ABD and Kerlix/Conform Wound #5 Left,Distal Toe Great o ABD and Kerlix/Conform Wound #6 Left,Lateral Toe Great o ABD and Kerlix/Conform Wound #7 Left Toe  Third o ABD and Kerlix/Conform Dressing Change Frequency Wound #1 Right,Distal Toe - Web between 2nd and 3rd o Change dressing every other day. Wound #2 Right Toe - Web between 3rd and 4th o Change dressing every other day. Wound #4 Right,Dorsal Toe Fifth o Change dressing every other day. Wound #5 Left,Distal Toe Great o Change dressing every other day. Wound #6 Left,Lateral Toe Great o Change dressing every other day. Wound #7 Left Toe Third o Change dressing every other day. Follow-up Appointments Kerekes, PAXTYN WISDOM ([id number]) Wound #1 Right,Distal Toe - Web between 2nd and 3rd o Return Appointment in 1 week. Wound #2 Right Toe - Web between 3rd and 4th o Return Appointment in 1 week. Wound #4 Right,Dorsal Toe Fifth o Return Appointment in 1 week. Wound #5 Left,Distal Toe Great o Return Appointment in 1 week. Wound #6 Left,Lateral Toe Great o Return Appointment in 1 week. Wound #7 Left Toe Third o Return Appointment in 1 week. Edema Control Wound #1 Right,Distal Toe - Web between 2nd and 3rd o Elevate legs to the level of the heart and pump ankles as often as possible Wound #2 Right Toe - Web between 3rd and 4th o  Elevate legs to the level of the heart and pump ankles as often as possible Wound #4 Right,Dorsal Toe Fifth o Elevate legs to the level of the heart and pump ankles as often as possible Wound #5 Left,Distal Toe Great o Elevate legs to the level of the heart and pump ankles as often as possible Wound #6 Left,Lateral Toe Great o Elevate legs to the level of the heart and pump ankles as often as possible Wound #7 Left Toe Third o Elevate legs to the level of the heart and pump ankles as often as possible Off-Loading Wound #1 Right,Distal Toe - Web between 2nd and 3rd o Open toe surgical shoe to: Wound #2 Right Toe - Web between 3rd and 4th o Open toe surgical shoe to: Wound #4 Right,Dorsal Toe Fifth o Open toe  surgical shoe to: Wound #5 Left,Distal Toe Great o Open toe surgical shoe to: Wound #6 Left,Lateral Toe Great o Open toe surgical shoe to: Wound #7 Left Toe Third o Open toe surgical shoe to: Pilgrim, Lamar G. ([id number]) Additional Orders / Instructions Wound #1 Right,Distal Toe - Web between 2nd and 3rd o Increase protein intake. Wound #2 Right Toe - Web between 3rd and 4th o Increase protein intake. Wound #4 Right,Dorsal Toe Fifth o Increase protein intake. Wound #5 Left,Distal Toe Great o Increase protein intake. Wound #6 Left,Lateral Toe Great o Increase protein intake. Wound #7 Left Toe Third o Increase protein intake. Home Health Wound #1 Right,Distal Toe - Web between 2nd and 3rd o Continue Home Health Visits - [facility] o Home Health Nurse may visit PRN to address patientos wound care needs. o FACE TO FACE ENCOUNTER: MEDICARE and MEDICAID PATIENTS: I certify that this patient is under my care and that I had a face-to-face encounter that meets the physician face-to-face encounter requirements with this patient on this date. The encounter with the patient was in whole or in part for the following MEDICAL CONDITION: (primary reason for Home Healthcare) MEDICAL NECESSITY: I certify, that based on my findings, NURSING services are a medically necessary home health service. HOME BOUND STATUS: I certify that my clinical findings support that this patient is homebound (i.e., Due to illness or injury, pt requires aid of supportive devices such as crutches, cane, wheelchairs, walkers, the use of special transportation or the assistance of another person to leave their place of residence. There is a normal inability to leave the home and doing so requires considerable and taxing effort. Other absences are for medical reasons / religious services and are infrequent or of short duration when for other reasons). o If current dressing causes regression in wound  condition, may D/C ordered dressing product/s and apply Normal Saline Moist Dressing daily until next Wound Healing Center / Other MD appointment. Notify Wound Healing Center of regression in wound condition at [phone]. o Please direct any NON-WOUND related issues/requests for orders to patient's Primary Care Physician Wound #2 Right Toe - Web between 3rd and 4th o Continue Home Health Visits - [facility] o Home Health Nurse may visit PRN to address patientos wound care needs. o FACE TO FACE ENCOUNTER: MEDICARE and MEDICAID PATIENTS: I certify that this patient is under my care and that I had a face-to-face encounter that meets the physician face-to-face encounter requirements with this patient on this date. The encounter with the patient was in whole or in part for the following MEDICAL CONDITION: (primary reason for Home Healthcare) MEDICAL NECESSITY: I certify, that based on my findings, NURSING  services are a medically necessary home health service. HOME BOUND STATUS: I certify that my clinical findings support that this patient is homebound (i.e., Due to illness or injury, pt requires aid of supportive devices such as crutches, cane, wheelchairs, walkers, the use of special transportation or the assistance of another person to leave their place of residence. There is a normal inability to leave the home and doing so requires considerable and taxing effort. Other absences are for medical reasons / religious services and are infrequent or of short duration when for other reasons). o If current dressing causes regression in wound condition, may D/C ordered dressing product/s and apply Normal Saline Moist Dressing daily until next Wound Healing Center / Other MD appointment. Notify Wound Healing Center of regression in wound condition at [phone]. o Please direct any NON-WOUND related issues/requests for orders to patient's Primary Care Physician Wound #4 Right,Dorsal Toe  Fifth o Continue Home Health Visits - [facility] SURAH, PELLEY ([id number]) o Home Health Nurse may visit PRN to address patientos wound care needs. o FACE TO FACE ENCOUNTER: MEDICARE and MEDICAID PATIENTS: I certify that this patient is under my care and that I had a face-to-face encounter that meets the physician face-to-face encounter requirements with this patient on this date. The encounter with the patient was in whole or in part for the following MEDICAL CONDITION: (primary reason for Home Healthcare) MEDICAL NECESSITY: I certify, that based on my findings, NURSING services are a medically necessary home health service. HOME BOUND STATUS: I certify that my clinical findings support that this patient is homebound (i.e., Due to illness or injury, pt requires aid of supportive devices such as crutches, cane, wheelchairs, walkers, the use of special transportation or the assistance of another person to leave their place of residence. There is a normal inability to leave the home and doing so requires considerable and taxing effort. Other absences are for medical reasons / religious services and are infrequent or of short duration when for other reasons). o If current dressing causes regression in wound condition, may D/C ordered dressing product/s and apply Normal Saline Moist Dressing daily until next Wound Healing Center / Other MD appointment. Notify Wound Healing Center of regression in wound condition at [phone]. o Please direct any NON-WOUND related issues/requests for orders to patient's Primary Care Physician Wound #5 Left,Distal Toe Great o Continue Home Health Visits - [facility] o Home Health Nurse may visit PRN to address patientos wound care needs. o FACE TO FACE ENCOUNTER: MEDICARE and MEDICAID PATIENTS: I certify that this patient is under my care and that I had a face-to-face encounter that meets the physician face-to-face encounter requirements with  this patient on this date. The encounter with the patient was in whole or in part for the following MEDICAL CONDITION: (primary reason for Home Healthcare) MEDICAL NECESSITY: I certify, that based on my findings, NURSING services are a medically necessary home health service. HOME BOUND STATUS: I certify that my clinical findings support that this patient is homebound (i.e., Due to illness or injury, pt requires aid of supportive devices such as crutches, cane, wheelchairs, walkers, the use of special transportation or the assistance of another person to leave their place of residence. There is a normal inability to leave the home and doing so requires considerable and taxing effort. Other absences are for medical reasons / religious services and are infrequent or of short duration when for other reasons). o If current dressing causes regression in wound condition, may D/C  ordered dressing product/s and apply Normal Saline Moist Dressing daily until next Wound Healing Center / Other MD appointment. Notify Wound Healing Center of regression in wound condition at [phone]. o Please direct any NON-WOUND related issues/requests for orders to patient's Primary Care Physician Wound #6 Left,Lateral Toe Doretha Sou Continue Home Health Visits - [facility] o Home Health Nurse may visit PRN to address patientos wound care needs. o FACE TO FACE ENCOUNTER: MEDICARE and MEDICAID PATIENTS: I certify that this patient is under my care and that I had a face-to-face encounter that meets the physician face-to-face encounter requirements with this patient on this date. The encounter with the patient was in whole or in part for the following MEDICAL CONDITION: (primary reason for Home Healthcare) MEDICAL NECESSITY: I certify, that based on my findings, NURSING services are a medically necessary home health service. HOME BOUND STATUS: I certify that my clinical findings support that this patient is homebound  (i.e., Due to illness or injury, pt requires aid of supportive devices such as crutches, cane, wheelchairs, walkers, the use of special transportation or the assistance of another person to leave their place of residence. There is a normal inability to leave the home and doing so requires considerable and taxing effort. Other absences are for medical reasons / religious services and are infrequent or of short duration when for other reasons). o If current dressing causes regression in wound condition, may D/C ordered dressing product/s and apply Normal Saline Moist Dressing daily until next Wound Healing Center / Other MD appointment. Notify Wound Healing Center of regression in wound condition at [phone]. o Please direct any NON-WOUND related issues/requests for orders to patient's Primary Care Physician Wound #7 Left Toe Third o Continue Home Health Visits - [facility] o Home Health Nurse may visit PRN to address patientos wound care needs. o FACE TO FACE ENCOUNTER: MEDICARE and MEDICAID PATIENTS: I certify that this patient is under my care and that I had a face-to-face encounter that meets the physician face-to-face encounter requirements with this patient on this date. The encounter with the patient was in whole or in part for the following MEDICAL CONDITION: (primary reason for Home Healthcare) MEDICAL NECESSITY: I certify, that based on my findings, DERIKA, ECKLES ([id number]) NURSING services are a medically necessary home health service. HOME BOUND STATUS: I certify that my clinical findings support that this patient is homebound (i.e., Due to illness or injury, pt requires aid of supportive devices such as crutches, cane, wheelchairs, walkers, the use of special transportation or the assistance of another person to leave their place of residence. There is a normal inability to leave the home and doing so requires considerable and taxing effort. Other absences are for  medical reasons / religious services and are infrequent or of short duration when for other reasons). o If current dressing causes regression in wound condition, may D/C ordered dressing product/s and apply Normal Saline Moist Dressing daily until next Wound Healing Center / Other MD appointment. Notify Wound Healing Center of regression in wound condition at [phone]. o Please direct any NON-WOUND related issues/requests for orders to patient's Primary Care Physician Electronic Signature(s) Signed: 08/24/2017 4:57:19 PM By: Elliot Gurney, BSN, RN, CWS, Kim RN, BSN Signed: 08/24/2017 5:07:14 PM By: Baltazar Najjar MD Entered By: Elliot Gurney, BSN, RN, CWS, Kim on 08/24/2017 16:03:45 Shuman, [name] ([id number]) -------------------------------------------------------------------------------- Problem List Details Patient Name: [name] Date of Service: 08/24/2017 3:15 PM Medical Record Number: [id number] Patient Account Number: [id number] Date of Birth/Sex:  [birth year] (71 y.o. 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 Female) Treating RN: Huel Coventry Primary Care Provider: Quintin Alto Other Clinician: Referring Provider: Quintin Alto Treating Provider/Extender: Altamese [place] in Treatment: 10 Subjective History of Present Illness (HPI) The following HPI elements were documented for the patient's wound: Location: right foot toes Quality: Patient reports experiencing a sharp pain to affected area(s). Severity: Patient states wound are getting worse. Duration: Patient has had the wound for < 2 weeks prior to presenting for treatment Timing: Pain in wound is constant (hurts all the time) Context: The wound appeared gradually over time Modifying Factors: Other treatment(s) tried include:recent interventional process to place a stent in her right lower extremity for critical limb ischemia Associated Signs and Symptoms: Patient reports having foul odor. 72 year old female who was referred by Dr. Nanetta Batty, who performed a endovascular procedure on 05/26/2017, for critical limb ischemia. Her angiogram showed a 99% below the knee popliteal stenosis which is stented with a 4 mm drug eluting stent. On 06/10/2017 he noted that her pulses are palpable and her Dopplers have normalized. He referred her to wound care for further advice and put her on Keflex 500 mg twice daily for 10 days. She has a past medical history of asthma, coronary artery disease, CHF, hyperlipidemia, ischemic cardiomyopathy, peripheral arterial disease and type 2 diabetes mellitus. She is status post recent abdominal aortogram with right lower extremity stenting, cholecystectomy, CABG, parathyroidectomy, thyroidectomy. She is a former smoker and quit in 1988. She had an ABI performed on 06/10/2017 with the right ABI within normal range and the left ABI shows mild lower  extremity arterial disease. The right ABI was 1.0 and biphasic and the left ABI was 0.93 and biphasic. Toe pressures were abnormal with the right being 0.27 and left being 0.31. the duplex study done showed a patent right popliteal artery stent without evidence of focal stenosis or obstruction. of note the patient started having signs of critical limb ischemia somewhere at the end of November and after an urgent workup was taken up for limb salvage with a successfully placed stent by Dr. Nanetta Batty, with good arterial perfusion postprocedure with normal ABIs noted on 06/10/2017. 06/22/17; patient was admitted to our clinic last week by Dr. Meyer Russel.she is a type II diabetic. She had undergone a right popliteal DES on 11/29 for critical limb ischemia including erythema and pain of her right forefoot. She states about 3 days later she started developing ulcerations in predominantly the right foot but also the left third toe. 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 This is never really healed rib. Repeat ABIs on 12/14 showed a right ABI of 1 and a left  ABI of 0.93 with toe pressures be markedly abnormal as noted above and Dr. Marcie Bal notes. The patient is still having a lot of pain making it very difficult for her to function at home this is worse on the right greater than left foot she does describe severe lancinating pain. She does not ambulate all that much and she finds it intolerable to wear footwear Her history is also notable for A. fib, severe cardiomyopathy with an ejection fraction of 25%_30%. Last echocardiogram in June of this year. According to Epic she is on a combination of Eliquis plavix and aspirin. 06/29/17; patient has an appointment with Dr. Imogene Burn of vascular surgery I believe next week. This was arranged by her primary physician. She is still having a lot of pain in the right side in general most of her toes look better however.  Supposed to be using silver alginate however [facility] applying currently applied wet to dry dressings. 07/06/17; the patient went for appointment with Dr. Imogene Burn. She is status post right popliteal stenting with peroneal artery angioplasty with Dr. Gery Pray. She presented to our clinic with bilateral lower extremity wounds in her toes which are exceptionally painful. Dr. Imogene Burn put her on a course of antibiotics. Her arterial studies from 06/10/17 showed an ABI in the right of 1 and ABI in the left of 0.8. Waveforms at the dorsalis pedis and posterior tibial were biphasic. However routine ABIs were Courtwright, Baby G. ([id number]) 0.31 bilaterally. The patient has severe bilateral rest pain. 07/20/17; the patient had her arteriogram by Dr. Imogene Burn on 07/14/17. Based on the images the patient was not felt to require any interventions and no further interventions were possible due to small size of the tibial arteries. The posterior tibial arteries bilaterally were occluded heel arteries were the dominant runoff which were miniscule. Anterior tibials both showed subtotal occlusion in the mid segment and these were small vessels. Paradoxically the patient complains of a lot less pain in the right foot however she is having a lot of pain in the left first and left third toes. She is managing this with activity limitation and narcotics. 07/27/17; I've spoken to Dr. Allyson Sabal today and he is going to do another arteriogram on the left leg early next week with possibility of retrograde arterial intervention. The patient is still having a lot of pain in the left third toe and left first toe which is worse when she puts her leg up i.e. lies in bed etc. 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 She has done better on the right which is the side where she had the DES stent placed in the right popliteal. She still has a wound on the medial aspect of the third toe and black eschar at the tip of the fourth and fifth toes  however most of the rest of the right foot looks better. 08/10/17; the patient was taken for angiography on 08/01/17; she had a successful left peroneal PTA and drug eluding stent now has a widely patent peroneal supply collateral to the distal posterior tibial that feeds the dorsal pedal arch. The patient is on antiplatelet therapy. Unfortunately she has not had much relief of pain in the left great and third toe. She has had an increase in her hydrocodone by her primary physician. She had follow-up noninvasive Doppler test done yesterday although I cannot pull up these results 08/17/17; patient returns to clinic. She states she feels better. She's had an increase in her hydrocodone to 10/325  she is taking a half a tablet every 3 hours and occasionally a whole one at night which allows her to sleep through the night. She is not having pain in the right foot but is having pain in the left. She went to see Dr. Allyson Sabal yesterday. He noted that he performed perineal interventions on 2//19 with 2 overlapping drug-eluting stents for perineal CTO. Her left posterior tibial is occluded as was her left anterior tibial at the level of the ankle. Recent Doppler showed patent peroneal stent. He takes it me yesterday to report the patient was in less pain which she seems to verify although this may be because of an increase in strength and frequency of her narcotics culture the drainage I did last week showed both methicillin sensitive staph aureus and Pseudomonas. I sent in ciprofloxacin I believe for 10 days which should cover both of these. This may be partially why the pain in the right great toe is better 08/24/17; the patient is generally better. Especially the areas on the right foot where there are still some ischemic change on the right third and right fifth toes but her foot is warm here and I don't think is contributing much to her pain. She still has considerable dry gangrene on the right great toe with a  open area medially. Also the left third toe. Things are still very tender here but considerably better. She doesn't have any purulent drainage or cellulitis and she is finished the antibiotics I gave her. She arrived in clinic with a low blood pressure she is having some chest pain Objective Constitutional Patient is hypotensive.. Pulse regular and within target range for patient. She reports being in atrial flutter and atrial fibrillation but not currently. Respirations regular, non-labored and within target range.. Temperature is normal and within the target range for the patient.. Somewhat pale looking but otherwise normal.. Vitals Time Taken: 3:33 PM, Height: 63 in, Weight: 176 lbs, BMI: 31.2, Temperature: 98.2 F, Pulse: 79 bpm, Respiratory Rate: 18 breaths/min, Blood Pressure: 93/41 mmHg, Pulse Oximetry: 100 %. General Notes: Took BP manually and it was 84/62 and made Dr. Leanord Hawking aware. Eyes Conjunctivae clear. No discharge. Ears, Nose, Mouth, and Throat Mucous membranes are moist. Landsberg, Jamiee G. ([id number]) Respiratory Respiratory effort is easy and symmetric bilaterally. Rate is normal at rest and on room air.. Bilateral breath sounds are clear and equal in all lobes with no wheezes, rales or rhonchi.. Cardiovascular patient does not appear to be dehydrated. Pulse feels regular. soft apical murmer. No S3. Pedal pulses absent bilaterally.. No edema and no evidence of DVT. Psychiatric No evidence of depression, anxiety, or agitation. Calm, cooperative, and communicative. Appropriate interactions and affect.. General Notes: Wound exam; the left foot is dry gangrene at the tip of the first toe there is an open area medially which is where I'm cultured the organisms from 2 weeks ago this is still open I would like to add silver alginate to the bed again. The left third toe looks better. On the right foot she has scant amount of dry eschar over the fourth and fifth toes her forefoot  feels warmer today. Integumentary (Hair, Skin) Skin and subcutaneous tissue without rashes, lesions, discoloration or ulcers. No evidence of fungal disease. Hair and skin color texture normal and healthy in appearance.. Wound #1 status is Open. Original cause of wound was Gradually Appeared. The wound is located on the Right,Distal Toe - Web between 2nd and 3rd. The wound measures 0.5cm length x 0.5cm  width x 0.1cm depth; 0.196cm^2 area and 0.02cm^3 volume. There is no tunneling or undermining noted. There is a medium amount of serous drainage noted. Foul odor after cleansing was noted. The wound margin is distinct with the outline attached to the wound base. There is no granulation within the wound bed. There is a large (67-100%) amount of necrotic tissue within the wound bed including Adherent Slough. The periwound skin appearance did not exhibit: Callus, Crepitus, Excoriation, Induration, Rash, Scarring, Dry/Scaly, Maceration, Atrophie Blanche, Cyanosis, Ecchymosis, Hemosiderin Staining, Mottled, Pallor, Rubor, Erythema. Periwound temperature was noted as No Abnormality. The periwound has tenderness on palpation. Wound #2 status is Open. Original cause of wound was Pressure Injury. The wound is located on the Right Toe - Web between 3rd and 4th. The wound measures 0.1cm length x 0.1cm width x 0.1cm depth; 0.008cm^2 area and 0.001cm^3 volume. There is no tunneling or undermining noted. There is a none present amount of drainage noted. The wound margin is distinct with the outline attached to the wound base. There is large (67-100%) granulation within the wound bed. There is no necrotic tissue within the wound bed. The periwound skin appearance did not exhibit: Callus, Crepitus, Excoriation, Induration, Rash, Scarring, Dry/Scaly, Maceration, Atrophie Blanche, Cyanosis, Ecchymosis, Hemosiderin Staining, Mottled, Pallor, Rubor, Erythema. Periwound temperature was noted as No Abnormality. The  periwound has tenderness on palpation. Wound #4 status is Open. Original cause of wound was Gradually Appeared. The wound is located on the Right,Dorsal Toe Fifth. The wound measures 0.2cm length x 0.2cm width x 0.1cm depth; 0.031cm^2 area and 0.003cm^3 volume. There is no tunneling or undermining noted. There is a medium amount of serous drainage noted. The wound margin is flat and intact. There is no granulation within the wound bed. There is a large (67-100%) amount of necrotic tissue within the wound bed including Eschar. The periwound skin appearance did not exhibit: Callus, Crepitus, Excoriation, Induration, Rash, Scarring, Dry/Scaly, Maceration, Atrophie Blanche, Cyanosis, Ecchymosis, Hemosiderin Staining, Mottled, Pallor, Rubor, Erythema. Periwound temperature was noted as No Abnormality. The periwound has tenderness on palpation. Wound #5 status is Open. Original cause of wound was Gradually Appeared. The wound is located on the [employer]. The wound measures 4.5cm length x 3cm width x 0.1cm depth; 10.603cm^2 area and 1.06cm^3 volume. There is no tunneling or undermining noted. There is a large amount of serous drainage noted. The wound margin is flat and intact. There is no granulation within the wound bed. There is a large (67-100%) amount of necrotic tissue within the wound bed including Eschar and Adherent Slough. The periwound skin appearance exhibited: Callus, Induration, Scarring, Dry/Scaly. The periwound skin appearance did not exhibit: Crepitus, Excoriation, Rash, Maceration, Atrophie Blanche, Cyanosis, Ecchymosis, Hemosiderin Staining, Mottled, Pallor, Rubor, Erythema. Periwound temperature was noted as No Abnormality. The periwound has tenderness on palpation. Wound #6 status is Open. Original cause of wound was Gradually Appeared. The wound is located on the [employer]. The wound measures 1cm length x 0.6cm width x 0.3cm depth; 0.471cm^2 area and 0.141cm^3  volume. There is no tunneling or undermining noted. There is a medium amount of purulent drainage noted. Foul odor after cleansing was noted. The wound margin is distinct with the outline attached to the wound base. There is no granulation within the wound bed. There is a large (67-100%) amount of necrotic tissue within the wound bed including Eschar and Adherent Slough. [facility], Stephaney G. ([id number]) periwound skin appearance exhibited: Callus, Crepitus, Induration, Maceration. The periwound skin appearance  did not exhibit: Excoriation, Rash, Scarring, Dry/Scaly, Atrophie Blanche, Cyanosis, Ecchymosis, Hemosiderin Staining, Mottled, Pallor, Rubor, Erythema. Periwound temperature was noted as No Abnormality. The periwound has tenderness on palpation. Wound #7 status is Open. Original cause of wound was Gradually Appeared. The wound is located on the Left Toe Third. The wound measures 2cm length x 2cm width x 0.1cm depth; 3.142cm^2 area and 0.314cm^3 volume. There is no tunneling or undermining noted. There is a large amount of serous drainage noted. Foul odor after cleansing was noted. The wound margin is distinct with the outline attached to the wound base. There is no granulation within the wound bed. There is a large (67-100%) amount of necrotic tissue within the wound bed including Eschar and Adherent Slough. The periwound skin appearance exhibited: Callus. The periwound skin appearance did not exhibit: Crepitus, Excoriation, Induration, Rash, Scarring, Dry/Scaly, Maceration, Atrophie Blanche, Cyanosis, Ecchymosis, Hemosiderin Staining, Mottled, Pallor, Rubor, Erythema. Periwound temperature was noted as No Abnormality. The periwound has tenderness on palpation. 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 May Shower, gently pat wound dry prior to applying new dressing. Wound #2 Right Toe - Web between 3rd and 4th: Clean wound with Normal Saline. May Shower, gently pat wound dry prior to applying new dressing. Wound #4 Right,Dorsal Toe Fifth: Clean wound with Normal Saline. May Shower, gently pat wound dry prior to applying new dressing. Wound #5 Left,Distal Toe Great: Clean wound with Normal Saline. May Shower, gently pat wound dry prior to applying new dressing. Wound #6 Left,Lateral Toe Great: Clean wound with Normal Saline. May Shower, gently pat wound dry prior to applying new dressing. Wound #7 Left Toe Third: Clean wound with Normal Saline. May Shower, gently pat wound dry prior to applying new dressing. Anesthetic (add to Medication List): Wound #1 Right,Distal Toe - Web between 2nd and 3rd: Topical Lidocaine 4% cream applied to wound bed prior to debridement (In Clinic Only). Wound #2 Right Toe - Web between 3rd and 4th: Topical Lidocaine 4% cream applied to wound bed prior to debridement (In Clinic Only). Wound #4 Right,Dorsal Toe Fifth: Topical Lidocaine 4% cream applied to wound bed prior to debridement (In Clinic Only). Cardosa, Allianna GMarland Kitchen ([id number]) Wound #5 Left,Distal Toe Great: Topical Lidocaine 4% cream applied to wound bed prior to debridement (In Clinic Only). Wound #6 Left,Lateral Toe Great: Topical Lidocaine 4% cream applied to wound bed prior to debridement (In Clinic Only). Wound #7 Left Toe Third: Topical Lidocaine 4% cream applied to wound bed prior to debridement (In Clinic Only). Primary Wound Dressing: Wound #1 Right,Distal Toe - Web between 2nd and 3rd: Other: - betadine, silvercell toes separated Wound #2 Right Toe - Web between 3rd and 4th: Other: -  betadine, silvercell toes separated Wound #4 Right,Dorsal Toe Fifth: Other: - betadine, silvercell toes separated Wound #5 Left,Distal Toe Great: Other: - betadine, silvercell toes separated Wound #6 Left,Lateral Toe Great: Other: - betadine, silvercell toes separated Wound #7 Left Toe Third: Other: - betadine, silvercell toes separated Secondary Dressing: Wound #1 Right,Distal Toe - Web between 2nd and 3rd: ABD and Kerlix/Conform Wound #2 Right Toe - Web between 3rd and 4th: ABD and  Kerlix/Conform Wound #4 Right,Dorsal Toe Fifth: ABD and Kerlix/Conform Wound #5 Left,Distal Toe Great: ABD and Kerlix/Conform Wound #6 Left,Lateral Toe Great: ABD and Kerlix/Conform Wound #7 Left Toe Third: ABD and Kerlix/Conform Dressing Change Frequency: Wound #1 Right,Distal Toe - Web between 2nd and 3rd: Change dressing every other day. Wound #2 Right Toe - Web between 3rd and 4th: Change dressing every other day. Wound #4 Right,Dorsal Toe Fifth: Change dressing every other day. Wound #5 Left,Distal Toe Great: Change dressing every other day. Wound #6 Left,Lateral Toe Great: Change dressing every other day. Wound #7 Left Toe Third: Change dressing every other day. Follow-up Appointments: Wound #1 Right,Distal Toe - Web between 2nd and 3rd: Return Appointment in 1 week. Wound #2 Right Toe - Web between 3rd and 4th: Return Appointment in 1 week. Wound #4 Right,Dorsal Toe Fifth: Return Appointment in 1 week. Wound #5 Left,Distal Toe Great: Return Appointment in 1 week. Wound #6 Left,Lateral Toe Great: Return Appointment in 1 week. Wound #7 Left Toe Third: Return Appointment in 1 week. Edema Control: Wound #1 Right,Distal Toe - Web between 2nd and 3rd: Ramanathan, LIMA CHILLEMI ([id number]) Elevate legs to the level of the heart and pump ankles as often as possible Wound #2 Right Toe - Web between 3rd and 4th: Elevate legs to the level of the heart and pump ankles as often as possible Wound #4  Right,Dorsal Toe Fifth: Elevate legs to the level of the heart and pump ankles as often as possible Wound #5 Left,Distal Toe Great: Elevate legs to the level of the heart and pump ankles as often as possible Wound #6 Left,Lateral Toe Great: Elevate legs to the level of the heart and pump ankles as often as possible Wound #7 Left Toe Third: Elevate legs to the level of the heart and pump ankles as often as possible Off-Loading: Wound #1 Right,Distal Toe - Web between 2nd and 3rd: Open toe surgical shoe to: Wound #2 Right Toe - Web between 3rd and 4th: Open toe surgical shoe to: Wound #4 Right,Dorsal Toe Fifth: Open toe surgical shoe to: Wound #5 Left,Distal Toe Great: Open toe surgical shoe to: Wound #6 Left,Lateral Toe Great: Open toe surgical shoe to: Wound #7 Left Toe Third: Open toe surgical shoe to: Additional Orders / Instructions: Wound #1 Right,Distal Toe - Web between 2nd and 3rd: Increase protein intake. Wound #2 Right Toe - Web between 3rd and 4th: Increase protein intake. Wound #4 Right,Dorsal Toe Fifth: Increase protein intake. Wound #5 Left,Distal Toe Great: Increase protein intake. Wound #6 Left,Lateral Toe Great: Increase protein intake. Wound #7 Left Toe Third: Increase protein intake. Home Health: Wound #1 Right,Distal Toe - Web between 2nd and 3rd: Continue Home Health Visits - [facility] Nurse may visit PRN to address patient s wound care needs. FACE TO FACE ENCOUNTER: MEDICARE and MEDICAID PATIENTS: I certify that this patient is under my care and that I had a face-to-face encounter that meets the physician face-to-face encounter requirements with this patient on this date. The encounter with the patient was in whole or in part for the following MEDICAL CONDITION: (primary reason for Home Healthcare) MEDICAL NECESSITY: I certify, that based on my findings, NURSING services are a medically necessary home health service. HOME BOUND STATUS: I  certify that my clinical findings support that this patient is homebound (i.e., Due to illness or injury, pt requires aid of supportive devices such as crutches, cane, wheelchairs, walkers, the use of special transportation or the assistance of another person  to leave their place of residence. There is a normal inability to leave the home and doing so requires considerable and taxing effort. Other absences are for medical reasons / religious services and are infrequent or of short duration when for other reasons). If current dressing causes regression in wound condition, may D/C ordered dressing product/s and apply Normal Saline Moist Dressing daily until next Wound Healing Center / Other MD appointment. Notify Wound Healing Center of regression in wound condition at [phone]. Please direct any NON-WOUND related issues/requests for orders to patient's Primary Care Physician Wound #2 Right Toe - Web between 3rd and 4th: Continue [facility] Visits - [facility] Nurse may visit PRN to address patient s wound care needs. FACE TO FACE ENCOUNTER: MEDICARE and MEDICAID PATIENTS: I certify that this patient is under my care and that I had a face-to-face encounter that meets the physician face-to-face encounter requirements with this patient on this date. The encounter with the patient was in whole or in part for the following MEDICAL CONDITION: (primary reason for Home Cassell, Jamela G. ([id number]) Healthcare) MEDICAL NECESSITY: I certify, that based on my findings, NURSING services are a medically necessary home health service. HOME BOUND STATUS: I certify that my clinical findings support that this patient is homebound (i.e., Due to illness or injury, pt requires aid of supportive devices such as crutches, cane, wheelchairs, walkers, the use of special transportation or the assistance of another person to leave their place of residence. There is a normal inability to leave the home and  doing so requires considerable and taxing effort. Other absences are for medical reasons / religious services and are infrequent or of short duration when for other reasons). If current dressing causes regression in wound condition, may D/C ordered dressing product/s and apply Normal Saline Moist Dressing daily until next Wound Healing Center / Other MD appointment. Notify Wound Healing Center of regression in wound condition at [phone]. Please direct any NON-WOUND related issues/requests for orders to patient's Primary Care Physician Wound #4 Right,Dorsal Toe Fifth: Continue [facility] Visits - [facility] Nurse may visit PRN to address patient s wound care needs. FACE TO FACE ENCOUNTER: MEDICARE and MEDICAID PATIENTS: I certify that this patient is under my care and that I had a face-to-face encounter that meets the physician face-to-face encounter requirements with this patient on this date. The encounter with the patient was in whole or in part for the following MEDICAL CONDITION: (primary reason for Home Healthcare) MEDICAL NECESSITY: I certify, that based on my findings, NURSING services are a medically necessary home health service. HOME BOUND STATUS: I certify that my clinical findings support that this patient is homebound (i.e., Due to illness or injury, pt requires aid of supportive devices such as crutches, cane, wheelchairs, walkers, the use of special transportation or the assistance of another person to leave their place of residence. There is a normal inability to leave the home and doing so requires considerable and taxing effort. Other absences are for medical reasons / religious services and are infrequent or of short duration when for other reasons). If current dressing causes regression in wound condition, may D/C ordered dressing product/s and apply Normal Saline Moist Dressing daily until next Wound Healing Center / Other MD appointment. Notify Wound Healing  Center of regression in wound condition at [phone]. Please direct any NON-WOUND related issues/requests for orders to patient's Primary Care Physician Wound #5 Left,Distal Toe Great: Continue [facility] Visits - [facility]  Nurse may visit PRN to address patient s wound care needs. FACE TO FACE ENCOUNTER: MEDICARE and MEDICAID PATIENTS: I certify that this patient is under my care and that I had a face-to-face encounter that meets the physician face-to-face encounter requirements with this patient on this date. The encounter with the patient was in whole or in part for the following MEDICAL CONDITION: (primary reason for Home Healthcare) MEDICAL NECESSITY: I certify, that based on my findings, NURSING services are a medically necessary home health service. HOME BOUND STATUS: I certify that my clinical findings support that this patient is homebound (i.e., Due to illness or injury, pt requires aid of supportive devices such as crutches, cane, wheelchairs, walkers, the use of special transportation or the assistance of another person to leave their place of residence. There is a normal inability to leave the home and doing so requires considerable and taxing effort. Other absences are for medical reasons / religious services and are infrequent or of short duration when for other reasons). If current dressing causes regression in wound condition, may D/C ordered dressing product/s and apply Normal Saline Moist Dressing daily until next Wound Healing Center / Other MD appointment. Notify Wound Healing Center of regression in wound condition at [phone]. Please direct any NON-WOUND related issues/requests for orders to patient's Primary Care Physician Wound #6 Left,Lateral Toe Great: Continue [facility] Visits - [facility] Nurse may visit PRN to address patient s wound care needs. FACE TO FACE ENCOUNTER: MEDICARE and MEDICAID PATIENTS: I certify that this patient is  under my care and that I had a face-to-face encounter that meets the physician face-to-face encounter requirements with this patient on this date. The encounter with the patient was in whole or in part for the following MEDICAL CONDITION: (primary reason for Home Healthcare) MEDICAL NECESSITY: I certify, that based on my findings, NURSING services are a medically necessary home health service. HOME BOUND STATUS: I certify that my clinical findings support that this patient is homebound (i.e., Due to illness or injury, pt requires aid of supportive devices such as crutches, cane, wheelchairs, walkers, the use of special transportation or the assistance of another person to leave their place of residence. There is a normal inability to leave the home and doing so requires considerable and taxing effort. Other absences are for medical reasons / religious services and are infrequent or of short duration when for other reasons). If current dressing causes regression in wound condition, may D/C ordered dressing product/s and apply Normal Saline Moist Dressing daily until next Wound Healing Center / Other MD appointment. Notify Wound Healing Center of regression in wound condition at [phone]. Please direct any NON-WOUND related issues/requests for orders to patient's Primary Care Physician Wound #7 Left Toe Third[facility] [name] LoaDEFALCO, Geraldy G. ([id number]) Home Health Nurse may visit PRN to address patient s wound care needs. FACE TO FACE ENCOUNTER: MEDICARE and MEDICAID PATIENTS: I certify that this patient is under my care and that I had a face-to-face encounter that meets the physician face-to-face encounter requirements with this patient on this date. The encounter with the patient was in whole or in part for the following MEDICAL CONDITION: (primary reason for Home Healthcare) MEDICAL NECESSITY: I certify, that based on my findings, NURSING services are a medically  necessary home health service. HOME BOUND STATUS: I certify that my clinical findings support that this patient is homebound (i.e., Due to illness or injury, pt requires aid of supportive devices  such as crutches, cane, wheelchairs, walkers, the use of special transportation or the assistance of another person to leave their place of residence. There is a normal inability to leave the home and doing so requires considerable and taxing effort. Other absences are for medical reasons / religious services and are infrequent or of short duration when for other reasons). If current dressing causes regression in wound condition, may D/C ordered dressing product/s and apply Normal Saline Moist Dressing daily until next Wound Healing Center / Other MD appointment. Notify Wound Healing Center of regression in wound condition at [phone]. Please direct any NON-WOUND related issues/requests for orders to patient's Primary Care Physician #1 we will add silver alginate to the right first toe. Continue Betadine #2 the patient has ischemic pain at rest and clearly her activity level was limited by what I think is a palpable component of PAD however her quality of days seems to be much better and her pain is under better control #3 she has a follow-up with Dr. Allyson Sabal and there was some question of referring her to Dr. Pernell Dupre at this point I don't think this is urgent #4 patient hypotensive on arrival to our clinic. Her atrial arrhythmias seem stable. She is not dehydrated. I don't see any evidence of congestive heart failure. Our medication list is nowhere near accurate. I asked her to see cardiology about the chest pain she doesn't have when necessary nitroglycerin. #5 follow-up in 2 weeks Electronic Signature(s) Signed: 08/24/2017 5:07:14 PM By: Baltazar Najjar MD Entered By: Baltazar Najjar on 08/24/2017 16:40:09 Killingsworth, [name]  ([id number]) -------------------------------------------------------------------------------- SuperBill Details Patient Name: [name] Date of Service: 08/24/2017 Medical Record Number: [id number] Patient Account Number: [id number] Date of Birth/Sex: [birth year] (72 y.o. 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 Atherosclerosis of nonbiological bypass graft(s) of the extremities with gangrene, right leg Facility Procedures CPT4 Code: [id number] Description: 25366 - WOUND CARE VISIT-LEV 5 EST PT Modifier: Quantity: 1 Physician Procedures CPT4 Code Description: [id number] [id number] - WC PHYS LEVEL 4 - EST PT ICD-10 Diagnosis Description 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

## 2017-08-28 NOTE — Progress Notes (Signed)
TRULA, FREDE ([id number]) Visit Report for 08/24/2017 Arrival Information Details Patient Name: [name], [name] Date of Service: 08/24/2017 3:15 PM Medical Record Number: [id number] Patient Account Number: [id number] Date of Birth/Sex: [birth year] (72 y.o. Female) Treating [name]: [name], [name] Primary Care [name]: [name] Other Clinician: Referring [name]: [name] Treating [name]/Extender: [name] in Treatment: 10 Visit Information History Since Last Visit All ordered tests and consults were completed: No Patient Arrived: Wheel Chair Added or deleted any medications: No Arrival Time: 15:31 Any new allergies or adverse reactions: No Accompanied [name]: husband Had a fall or experienced change in No Transfer Assistance: EasyPivot Patient activities of daily living that may affect Lift risk of falls: Patient Identification Verified: Yes Signs or symptoms of abuse/neglect since last visito No Secondary Verification Process Yes Hospitalized since last visit: No Completed: Has Dressing in Place as Prescribed: Yes Patient Requires Transmission-Based No Precautions: Pain Present Now: Yes Patient Has Alerts: Yes Patient Alerts: Patient on Blood Thinner Prediabetic Eliquis Electronic Signature(s) Signed: 08/24/2017 4:30:35 PM [name]: [name]: [name] on 08/24/2017 15:32:08 [name] ([id number]) -------------------------------------------------------------------------------- Clinic Level of Care Assessment Details Patient Name: [name] Date of Service: 08/24/2017 3:15 PM Medical Record Number: [id number] Patient Account Number: [id number] Date of Birth/Sex: 1945/09/20 (72 y.o. Female) Treating [name]: [name] Primary Care Ridley Dileo: [name] Other Clinician: Referring Rowena Moilanen: [name] Treating Kamon Fahr/Extender: [name] [place] in Treatment: 10 Clinic Level of Care Assessment  Items TOOL 4 Quantity Score []  - Use when only an EandM is performed on FOLLOW-UP visit 0 ASSESSMENTS - Nursing Assessment / Reassessment []  - Reassessment of Co-morbidities (includes updates in patient status) 0 X- 1 5 Reassessment of Adherence to Treatment Plan ASSESSMENTS - Wound and Skin Assessment / Reassessment []  - Simple Wound Assessment / Reassessment - one wound 0 X- 6 5 Complex Wound Assessment / Reassessment - multiple wounds []  - 0 Dermatologic / Skin Assessment (not related to wound area) ASSESSMENTS - Focused Assessment []  - Circumferential Edema Measurements - multi extremities 0 []  - 0 Nutritional Assessment / Counseling / Intervention []  - 0 Lower Extremity Assessment (monofilament, tuning fork, pulses) []  - 0 Peripheral Arterial Disease Assessment (using hand held doppler) ASSESSMENTS - Ostomy and/or Continence Assessment and Care []  - Incontinence Assessment and Management 0 []  - 0 Ostomy Care Assessment and Management (repouching, etc.) PROCESS - Coordination of Care X - Simple Patient / Family Education for ongoing care 1 15 []  - 0 Complex (extensive) Patient / Family Education for ongoing care X- 1 10 Staff obtains Chiropractor, Records, Test Results / Process Orders []  - 0 Staff telephones HHA, Nursing Homes / Clarify orders / etc []  - 0 Routine Transfer to another Facility (non-emergent condition) []  - 0 Routine Hospital Admission (non-emergent condition) []  - 0 New Admissions / Manufacturing engineer / Ordering NPWT, Apligraf, etc. []  - 0 Emergency Hospital Admission (emergent condition) X- 1 10 Simple Discharge Coordination [name], [name] ([id number]) []  - 0 Complex (extensive) Discharge Coordination PROCESS - Special Needs []  - Pediatric / Minor Patient Management 0 []  - 0 Isolation Patient Management []  - 0 Hearing / Language / Visual special needs []  - 0 Assessment of Community assistance (transportation, D/C planning, etc.) []  -  0 Additional assistance / Altered mentation []  - 0 Support Surface(s) Assessment (bed, cushion, seat, etc.) INTERVENTIONS - Wound Cleansing / Measurement []  - Simple Wound Cleansing - one wound 0 X- 6 5 Complex Wound Cleansing - multiple  wounds X- 1 5 Wound Imaging (photographs - any number of wounds) []  - 0 Wound Tracing (instead of photographs) []  - 0 Simple Wound Measurement - one wound X- 6 5 Complex Wound Measurement - multiple wounds INTERVENTIONS - Wound Dressings []  - Small Wound Dressing one or multiple wounds 0 []  - 0 Medium Wound Dressing one or multiple wounds X- 2 20 Large Wound Dressing one or multiple wounds []  - 0 Application of Medications - topical []  - 0 Application of Medications - injection INTERVENTIONS - Miscellaneous []  - External ear exam 0 []  - 0 Specimen Collection (cultures, biopsies, blood, body fluids, etc.) []  - 0 Specimen(s) / Culture(s) sent or taken to Lab for analysis []  - 0 Patient Transfer (multiple staff / Nurse, adult / Similar devices) []  - 0 Simple Staple / Suture removal (25 or less) []  - 0 Complex Staple / Suture removal (26 or more) []  - 0 Hypo / Hyperglycemic Management (close monitor of Blood Glucose) []  - 0 Ankle / Brachial Index (ABI) - do not check if billed separately X- 1 5 Vital Signs [name], [name] ([id number]) Has the patient been seen at the hospital within the last three years: Yes Total Score: 180 Level Of Care: New/Established - Level 5 Electronic Signature(s) Signed: 08/24/2017 4:57:19 PM [name]: [name], BSN, [name], CWS, [name], BSN Entered [name]: [name], BSN, [name], CWS, [name] on 08/24/2017 16:05:04 [name] ([id number]) -------------------------------------------------------------------------------- Encounter Discharge Information Details Patient Name: [name] Date of Service: 08/24/2017 3:15 PM Medical Record Number: [id number] Patient Account Number: [id number] Date of Birth/Sex: [birth year] (72 y.o.  Female) Treating [name]: [name] Primary Care [name]: [name] Other Clinician: Referring [name]: [name] Treating [name]/Extender: [name] [place] in Treatment: 10 Encounter Discharge Information Items Discharge Pain Level: 0 Discharge Condition: Stable Ambulatory Status: Wheelchair Discharge Destination: Home Transportation: Private Auto Accompanied [name]: husband Schedule Follow-up Appointment: Yes Medication Reconciliation completed and No provided to Patient/Care [place]: Provided on Clinical Summary of Care: 08/24/2017 Form Type Recipient Paper Patient MD Electronic Signature(s) Signed: 08/26/2017 10:09:42 AM [name]: Gwenlyn [name] Entered [name]: Gwenlyn [name] on 08/24/2017 16:29:50 Gebel, [name] ([id number]) -------------------------------------------------------------------------------- Lower Extremity Assessment Details Patient Name: [name] Date of Service: 08/24/2017 3:15 PM Medical Record Number: [id number] Patient Account Number: [id number] Date of Birth/Sex: 1945/11/13 (72 y.o. Female) Treating [name]: [name], [name] Primary Care Rohini Jaroszewski: [name] Other Clinician: Referring Asja Frommer: [name] Treating Reese Senk/Extender: [name] [place] in Treatment: 10 Edema Assessment Assessed: [Left: No] [Right: No] Edema: [Left: Yes] [Right: No] Vascular Assessment Claudication: Claudication Assessment [Left:Intermittent] [Right:Intermittent] Pulses: Dorsalis Pedis Palpable: [Left:No] [Right:No] Doppler Audible: [Left:Yes] [Right:Yes] Posterior Tibial Extremity colors, hair growth, and conditions: Extremity Color: [Left:Normal] [Right:Normal] Hair Growth on Extremity: [Left:Yes] [Right:Yes] Temperature of Extremity: [Left:Warm] [Right:Warm] Toe Nail Assessment Left: Right: Thick: Yes Yes Discolored: Yes Yes Deformed: Yes Yes Improper Length and Hygiene: Yes Yes Electronic Signature(s) Signed: 08/24/2017  4:30:35 PM [name]: [name]: [name] on 08/24/2017 15:49:41 Carelock, [name] ([id number]) -------------------------------------------------------------------------------- Multi Wound Chart Details Patient Name: [name] Date of Service: 08/24/2017 3:15 PM Medical Record Number: [id number] Patient Account Number: [id number] Date of Birth/Sex: 02-10-1946 (72 y.o. Female) Treating [name]: [name] Primary Care Rekia Kujala: [name] Other Clinician: Referring Zephaniah Lubrano: [name] Treating Cope Marte/Extender: [name] [place] in Treatment: 10 Vital Signs Height(in): 63 Pulse(bpm): 79 Weight(lbs): 176 Blood Pressure(mmHg): 93/41 Body Mass Index(BMI): 31 Temperature(F): 98.2 Respiratory Rate 18 (breaths/min): Photos: [1:No Photos] [2:No Photos] [4:No Photos]  Wound Location: [1:Right Toe - Web between 2nd and 3rd - Distal] [2:Right Toe - Web between 3rd and 4th] [4:Right Toe Fifth - Dorsal] Wounding Event: [1:Gradually Appeared] [2:Pressure Injury] [4:Gradually Appeared] Primary Etiology: [1:Arterial Insufficiency Ulcer] [2:Arterial Insufficiency Ulcer] [4:Arterial Insufficiency Ulcer] Comorbid History: [1:Anemia, Arrhythmia, Congestive Heart Failure, Hypertension, Myocardial Infarction, Peripheral Arterial Disease, Neuropathy] [2:Anemia, Arrhythmia, Congestive Heart Failure, Hypertension, Myocardial Infarction, Peripheral Arterial  Disease, Neuropathy] [4:Anemia, Arrhythmia, Congestive Heart Failure, Hypertension, Myocardial Infarction, Peripheral Arterial Disease, Neuropathy] Date Acquired: [1:05/30/2017] [2:02/21/2017] [4:07/19/2017] Weeks of Treatment: [1:10] [2:9] [4:5] Wound Status: [1:Open] [2:Open] [4:Open] Clustered Wound: [1:Yes] [2:No] [4:No] Pending Amputation on [1:Yes] [2:No] [4:No] Presentation: Measurements L x W x D [1:0.5x0.5x0.1] [2:0.1x0.1x0.1] [4:0.2x0.2x0.1] (cm) Area (cm) : [1:0.196] [2:0.008] [4:0.031] Volume (cm) :  [1:0.02] [2:0.001] [4:0.003] % Reduction in Area: [1:99.20%] [2:99.50%] [4:0.00%] % Reduction in Volume: [1:99.20%] [2:99.30%] [4:0.00%] Classification: [1:Full Thickness Without Exposed Support Structures] [2:Partial Thickness] [4:Partial Thickness] Exudate Amount: [1:Medium] [2:None Present] [4:Medium] Exudate Type: [1:Serous] [2:N/A] [4:Serous] Exudate Color: [1:amber] [2:N/A] [4:amber] Foul Odor After Cleansing: [1:Yes] [2:No] [4:No] Odor Anticipated Due to [1:No] [2:N/A] [4:N/A] Product Use: Wound Margin: [1:Distinct, outline attached] [2:Distinct, outline attached] [4:Flat and Intact] Granulation Amount: [1:None Present (0%)] [2:Large (67-100%)] [4:None Present (0%)] Necrotic Amount: [1:Large (67-100%)] [2:None Present (0%)] [4:Large (67-100%)] Necrotic Tissue: [1:Adherent Slough] [2:N/A] [4:Eschar] Epithelialization: [1:None] [2:Large (67-100%)] [4:None] Periwound Skin Texture: [1:Excoriation: No Induration: No] [2:Excoriation: No Induration: No] [4:Excoriation: No Induration: No] Callus: No Callus: No Callus: No Crepitus: No Crepitus: No Crepitus: No Rash: No Rash: No Rash: No Scarring: No Scarring: No Scarring: No Periwound Skin Moisture: Maceration: No Maceration: No Maceration: No Dry/Scaly: No Dry/Scaly: No Dry/Scaly: No Periwound Skin Color: Atrophie Blanche: No Atrophie Blanche: No Atrophie Blanche: No Cyanosis: No Cyanosis: No Cyanosis: No Ecchymosis: No Ecchymosis: No Ecchymosis: No Erythema: No Erythema: No Erythema: No Hemosiderin Staining: No Hemosiderin Staining: No Hemosiderin Staining: No Mottled: No Mottled: No Mottled: No Pallor: No Pallor: No Pallor: No Rubor: No Rubor: No Rubor: No Temperature: No Abnormality No Abnormality No Abnormality Tenderness on Palpation: Yes Yes Yes Wound Preparation: Ulcer Cleansing: Ulcer Cleansing: Ulcer Cleansing: Rinsed/Irrigated with Saline Rinsed/Irrigated with Saline Rinsed/Irrigated with  Saline Topical Anesthetic Applied: Topical Anesthetic Applied: Topical Anesthetic Applied: Other: lidocaine 4% Other: lidocaine 4% Other: lidocaine 4% Wound Number: [id number]  Photos: No Photos No Photos No Photos Wound Location: Left Toe Great - Distal Left Toe Great - Lateral Left Toe Third Wounding Event: Gradually Appeared Gradually Appeared Gradually Appeared 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6x0.3 2x2x0.1 (cm) Area (cm) : 10.603 0.471 3.142 Volume (cm) : 1.06 0.141 0.314 % Reduction in Area: -574.90% 84.90% -316.70% % Reduction in Volume: -6525.00% 54.70% -318.70% Classification: Partial Thickness Partial Thickness Partial Thickness Exudate Amount: Large Medium Large Exudate Type: Serous Purulent Serous Exudate Color: amber yellow, brown, green amber Foul Odor After Cleansing: No Yes Yes Odor Anticipated Due to N/A No No Product Use: Wound Margin: Flat and Intact Distinct, outline attached Distinct, outline attached Granulation Amount: None Present (0%) None Present (0%) None  Present (0%) Necrotic Amount: Large (67-100%) Large (67-100%) Large (67-100%) Necrotic Tissue: Eschar, Adherent Slough Eschar, Adherent Slough Eschar, Adherent Slough Exposed Structures: Fascia: No Fascia: No Fascia: No Fat Layer (Subcutaneous  Fat Layer (Subcutaneous Fat Layer (Subcutaneous Bon, Adamari G. ([id number]) Tissue) Exposed: No Tissue) Exposed: No Tissue) Exposed: No Tendon: No Tendon: No Tendon: No Muscle: No Muscle: No Muscle: No Joint: No Joint: No Joint: No Bone: No Bone: No Bone: No Epithelialization: None None None Periwound Skin Texture: Induration: Yes Induration: Yes Callus: Yes Callus: Yes Callus: Yes Excoriation: No Scarring: Yes Crepitus: Yes Induration: No Excoriation: No Excoriation: No Crepitus: No Crepitus: No Rash: No Rash: No Rash: No Scarring: No Scarring: No Periwound Skin Moisture: Dry/Scaly: Yes Maceration: Yes Maceration: No Maceration: No Dry/Scaly: No Dry/Scaly: No Periwound Skin Color: Atrophie Blanche: No Atrophie Blanche: No Atrophie Blanche: No Cyanosis: No Cyanosis: No Cyanosis: No Ecchymosis: No Ecchymosis: No Ecchymosis: No Erythema: No Erythema: No Erythema: No Hemosiderin Staining: No Hemosiderin Staining: No Hemosiderin Staining: No Mottled: No Mottled: No Mottled: No Pallor: No Pallor: No Pallor: No Rubor: No Rubor: No Rubor: No Temperature: No Abnormality No Abnormality No Abnormality Tenderness on Palpation: Yes Yes Yes Wound Preparation: Ulcer Cleansing: Ulcer Cleansing: Ulcer Cleansing: Rinsed/Irrigated with Saline Rinsed/Irrigated with Saline Rinsed/Irrigated with Saline Topical Anesthetic Applied: Topical Anesthetic Applied: Topical Anesthetic Applied: Other: lidocaine 4% Other: lidocaine 4% Other: lidocaine 4% Treatment Notes Electronic Signature(s) Signed: 08/24/2017 4:57:19 PM [name]: [name], BSN, [name], CWS, [name], BSN Entered [name]: [name], BSN, [name], CWS, [name] on 08/24/2017 15:58:30 Tigges, [name] ([id number]) -------------------------------------------------------------------------------- Multi-Disciplinary Care Plan Details Patient Name: [name] Date of Service: 08/24/2017 3:15 PM Medical Record Number:  [id number] Patient Account Number: [id number] Date of Birth/Sex: 06/26/1946 (72 y.o. Female) Treating [name]: [name] Primary Care Jenin Birdsall: [name] Other Clinician: Referring Lorretta Kerce: [name] Treating Khasir Woodrome/Extender: [name] [place] in Treatment: 10 Active Inactive ` Abuse / Safety / Falls / Self Care Management Nursing Diagnoses: History of Falls Impaired home maintenance Potential for falls Goals: Patient will not experience any injury related to falls Date Initiated: 06/13/2017 Target Resolution Date: 07/14/2017 Goal Status: Active Patient will remain injury free related to falls Date Initiated: 06/13/2017 Target Resolution Date: 07/14/2017 Goal Status: Active Interventions: Assess fall risk on admission and as needed Treatment Activities: Patient referred to home care : 06/13/2017 Notes: ` Necrotic Tissue Nursing Diagnoses: Impaired tissue integrity related to necrotic/devitalized tissue Goals: Necrotic/devitalized tissue will be minimized in the wound bed Date Initiated: 06/13/2017 Target Resolution Date: 07/14/2017 Goal Status: Active Interventions: Assess patient pain level pre-, during and post procedure and prior to discharge Provide education on necrotic tissue and debridement process Treatment Activities: Apply topical anesthetic as ordered : 06/13/2017 Notes: Mcquilkin, [name] ([id number]) ` Orientation to the Wound Care Program Nursing Diagnoses: Knowledge deficit related to the wound healing center program Goals: Patient/caregiver will verbalize understanding of the Wound Healing Center Program Date Initiated: 06/13/2017 Target Resolution Date: 07/14/2017 Goal Status: Active Interventions: Provide education on orientation to the wound center Notes: ` Wound/Skin Impairment Nursing Diagnoses: Impaired tissue integrity Knowledge deficit related to ulceration/compromised skin integrity Goals: Ulcer/skin breakdown will have a  volume reduction of 30% [name] week 4 Date Initiated: 06/13/2017 Target Resolution Date: 07/14/2017 Goal Status: Active Interventions: Assess ulceration(s) every visit Treatment Activities: Patient referred to home care : 06/13/2017 Topical wound management initiated : 06/13/2017 Notes: Electronic Signature(s) Signed: 08/24/2017 4:57:19 PM [name]: [name], BSN, [name], CWS, [name], BSN Entered [name]: [name], BSN,  [name], CWS, [name] on 08/24/2017 15:58:18 Tibbits, [name] ([id number]) -------------------------------------------------------------------------------- Pain Assessment Details Patient Name: REDONNA, WILBERT Date of Service: 08/24/2017 3:15 PM Medical Record Number: [id number] Patient Account Number: [id number] Date of Birth/Sex: 1946/03/24 (72 y.o. Female) Treating [name]: [name], [name] Primary Care Jaspal Pultz: [name] Other Clinician: Referring Kaysen Deal: [name] Treating Jalynne Persico/Extender: [name] [place] in Treatment: 10 Active Problems Location of Pain Severity and Description of Pain Patient Has Paino Yes Site Locations Rate the pain. Current Pain Level: 3 Character of Pain Describe the Pain: Aching Pain Management and Medication Current Pain Management: Notes Topical or injectable lidocaine is offered to patient for acute pain when surgical debridement is performed. If needed, Patient is instructed to use over the counter pain medication for the following 24-48 hours after debridement. Wound care MDs do not prescribed pain medications. Patient has chronic pain or uncontrolled pain. Patient has been instructed to make an appointment with their Primary Care Physician for pain management. Electronic Signature(s) Signed: 08/24/2017 4:30:35 PM [name]: [name]: [name] on 08/24/2017 15:33:18 [place], [name] ([id number]) -------------------------------------------------------------------------------- Patient/Caregiver Education Details Patient  Name: [name] Date of Service: 08/24/2017 3:15 PM Medical Record Number: [id number] Patient Account Number: [id number] Date of Birth/Gender: [birth year] (72 y.o. Female) Treating [name]: [name] Primary Care Physician: [name] Other Clinician: Referring Physician: Quintin [name] Treating Physician/Extender: [name] [place] in Treatment: 10 Education Assessment Education Provided To: Patient Education Topics Provided Wound/Skin Impairment: Handouts: Caring for Your Ulcer Methods: Explain/Verbal Responses: State content correctly Electronic Signature(s) Signed: 08/26/2017 5:56:09 PM [name]: [name] Entered [name]: [name] on 08/24/2017 16:28:36 Bero, [name] ([id number]) -------------------------------------------------------------------------------- Wound Assessment Details Patient Name: [name] Date of Service: 08/24/2017 3:15 PM Medical Record Number: [id number] Patient Account Number: [id number] Date of Birth/Sex: [birth year] (72 y.o. Female) Treating [name]: [name], [name] Primary Care Dalisa Forrer: [name] Other Clinician: Referring Trelon Plush: [name] Treating Jatoria Kneeland/Extender: [name] [place] in Treatment: 10 Wound Status Wound Number: 1 Primary Arterial Insufficiency Ulcer Etiology: Wound Location: Right Toe - Web between 2nd and 3rd - Distal Wound Open Status: Wounding Event: Gradually Appeared Comorbid Anemia, Arrhythmia, Congestive Heart Failure, Date Acquired: 05/30/2017 History: Hypertension, Myocardial Infarction, Peripheral Weeks Of Treatment: 10 Arterial Disease, Neuropathy Clustered Wound: Yes Pending Amputation On Presentation Photos Photo Uploaded [name]: [name] on 08/24/2017 16:25:51 Wound Measurements Length: (cm) 0.5 Width: (cm) 0.5 Depth: (cm) 0.1 Area: (cm) 0.196 Volume: (cm) 0.02 % Reduction in Area: 99.2% % Reduction in Volume: 99.2% Epithelialization: None Tunneling:  No Undermining: No Wound Description Full Thickness Without Exposed Support Classification: Structures Wound Margin: Distinct, outline attached Exudate Medium Amount: Exudate Type: Serous Exudate Color: amber Foul Odor After Cleansing: Yes Due to Product Use: No Slough/Fibrino Yes Wound Bed Granulation Amount: None Present (0%) Necrotic Amount: Large (67-100%) Necrotic Quality: Adherent Slough Periwound Skin Texture Texture Color [name], [name] ([id number]) No Abnormalities Noted: No No Abnormalities Noted: No Callus: No Atrophie Blanche: No Crepitus: No Cyanosis: No Excoriation: No Ecchymosis: No Induration: No Erythema: No Rash: No Hemosiderin Staining: No Scarring: No Mottled: No Pallor: No Moisture Rubor: No No Abnormalities Noted: No Dry / Scaly: No Temperature / Pain Maceration: No Temperature: No Abnormality Tenderness on Palpation: Yes Wound Preparation Ulcer Cleansing: Rinsed/Irrigated with Saline Topical Anesthetic Applied: Other: lidocaine 4%, Treatment Notes Wound #1 (Right, Distal Toe - Web between 2nd and 3rd) 1. Cleansed with: Clean wound with Normal Saline 2. Anesthetic Topical Lidocaine 4% cream to wound bed  prior to debridement 4. Dressing Applied: Other dressing (specify in notes) 5. Secondary Dressing Applied ABD Pad Notes betadine on wounds, silvercell, kerlix wrap Electronic Signature(s) Signed: 08/24/2017 4:30:35 PM [name]: [name] MullingPinkerton, Debra Entered [name]: [name] MullingPinkerton, Debra on 08/24/2017 15:42:52 Estey, [name] ([id number]) -------------------------------------------------------------------------------- Wound Assessment Details Patient Name: [name] LoaEFALCO, Glyn G. Date of Service: 08/24/2017 3:15 PM Medical Record Number: [id number] Patient Account Number: [id number] Date of Birth/Sex: [birth year](72 y.o. Female) Treating [name]: [name] CordiaPinkerton, [name] Primary Care Conrado Nance: [name] AltoBURDINE, STEVEN Other Clinician: Referring Annaliza Zia: [name] AltoBURDINE,  STEVEN Treating Ciaira Natividad/Extender: [name] CarolinaOBSON, MICHAEL G Weeks in Treatment: 10 Wound Status Wound Number: 2 Primary Arterial Insufficiency Ulcer Etiology: Wound Location: Right Toe - Web between 3rd and 4th Wound Open Wounding Event: Pressure Injury Status: Date Acquired: 02/21/2017 Comorbid Anemia, Arrhythmia, Congestive Heart Failure, Weeks Of Treatment: 9 History: Hypertension, Myocardial Infarction, Peripheral Clustered Wound: No Arterial Disease, Neuropathy Photos Photo Uploaded [name]: [name] MullingPinkerton, Debra on 08/24/2017 16:25:52 Wound Measurements Length: (cm) 0.1 Width: (cm) 0.1 Depth: (cm) 0.1 Area: (cm) 0.008 Volume: (cm) 0.001 % Reduction in Area: 99.5% % Reduction in Volume: 99.3% Epithelialization: Large (67-100%) Tunneling: No Undermining: No Wound Description Classification: Partial Thickness Wound Margin: Distinct, outline attached Exudate Amount: None Present Foul Odor After Cleansing: No Slough/Fibrino Yes Wound Bed Granulation Amount: Large (67-100%) Necrotic Amount: None Present (0%) Periwound Skin Texture Texture Color No Abnormalities Noted: No No Abnormalities Noted: No Callus: No Atrophie Blanche: No Crepitus: No Cyanosis: No Excoriation: No Ecchymosis: No Induration: No Erythema: No Rash: No Hemosiderin Staining: No [name], [name] ([id number]) Scarring: No Mottled: No Pallor: No Moisture Rubor: No No Abnormalities Noted: No Dry / Scaly: No Temperature / Pain Maceration: No Temperature: No Abnormality Tenderness on Palpation: Yes Wound Preparation Ulcer Cleansing: Rinsed/Irrigated with Saline Topical Anesthetic Applied: Other: lidocaine 4%, Treatment Notes Wound #2 (Right Toe - Web between 3rd and 4th) 1. Cleansed with: Clean wound with Normal Saline 2. Anesthetic Topical Lidocaine 4% cream to wound bed prior to debridement 4. Dressing Applied: Other dressing (specify in notes) 5. Secondary Dressing Applied ABD  Pad Notes betadine on wounds, silvercell, kerlix wrap Electronic Signature(s) Signed: 08/24/2017 4:30:35 PM [name]: [name] MullingPinkerton, Debra Entered [name]: [name] MullingPinkerton, Debra on 08/24/2017 15:43:58 [name], [name] ([id number]) -------------------------------------------------------------------------------- Wound Assessment Details Patient Name: [name] LoaEFALCO, [name] Date of Service: 08/24/2017 3:15 PM Medical Record Number: [id number] Patient Account Number: [id number] Date of Birth/Sex: [birth year](72 y.o. Female) Treating [name]: [name] CordiaPinkerton, [name] Primary Care Shalaunda Weatherholtz: [name] AltoBURDINE, STEVEN Other Clinician: Referring Geroldine Esquivias: [name] AltoBURDINE, STEVEN Treating Azayla Polo/Extender: [name] CarolinaOBSON, MICHAEL G Weeks in Treatment: 10 Wound Status Wound Number: 4 Primary Arterial Insufficiency Ulcer Etiology: Wound Location: Right Toe Fifth - Dorsal Wound Open Wounding Event: Gradually Appeared Status: Date Acquired: 07/19/2017 Comorbid Anemia, Arrhythmia, Congestive Heart Failure, Weeks Of Treatment: 5 History: Hypertension, Myocardial Infarction, Peripheral Clustered Wound: No Arterial Disease, Neuropathy Photos Photo Uploaded [name]: [name] MullingPinkerton, Debra on 08/24/2017 16:26:28 Wound Measurements Length: (cm) 0.2 Width: (cm) 0.2 Depth: (cm) 0.1 Area: (cm) 0.031 Volume: (cm) 0.003 % Reduction in Area: 0% % Reduction in Volume: 0% Epithelialization: None Tunneling: No Undermining: No Wound Description Classification: Partial Thickness Wound Margin: Flat and Intact Exudate Amount: Medium Exudate Type: Serous Exudate Color: amber Foul Odor After Cleansing: No Slough/Fibrino Yes Wound Bed Granulation Amount: None Present (0%) Exposed Structure Necrotic Amount: Large (67-100%) Fascia Exposed: No Necrotic Quality: Eschar Fat Layer (Subcutaneous Tissue) Exposed: No Tendon Exposed: No Muscle Exposed: No Joint Exposed: No Bone Exposed: No Periwound Skin Texture [name], [name] ([id number]) Texture Color No Abnormalities  Noted:  No No Abnormalities Noted: No Callus: No Atrophie Blanche: No Crepitus: No Cyanosis: No Excoriation: No Ecchymosis: No Induration: No Erythema: No Rash: No Hemosiderin Staining: No Scarring: No Mottled: No Pallor: No Moisture Rubor: No No Abnormalities Noted: No Dry / Scaly: No Temperature / Pain Maceration: No Temperature: No Abnormality Tenderness on Palpation: Yes Wound Preparation Ulcer Cleansing: Rinsed/Irrigated with Saline Topical Anesthetic Applied: Other: lidocaine 4%, Treatment Notes Wound #4 (Right, Dorsal Toe Fifth) 1. Cleansed with: Clean wound with Normal Saline 2. Anesthetic Topical Lidocaine 4% cream to wound bed prior to debridement 4. Dressing Applied: Other dressing (specify in notes) 5. Secondary Dressing Applied ABD Pad Notes betadine on wounds, silvercell, kerlix wrap Electronic Signature(s) Signed: 08/24/2017 4:30:35 PM [name]: [name]: [name] on 08/24/2017 15:44:42 [name], [name] ([id number]) -------------------------------------------------------------------------------- Wound Assessment Details Patient Name: [name] Date of Service: 08/24/2017 3:15 PM Medical Record Number: [id number] Patient Account Number: [id number] Date of Birth/Sex: 10/25/1945 (72 y.o. Female) Treating [name]: [name], [name] Primary Care Alysah Carton: [name] Other Clinician: Referring Aarik Blank: [name] Treating Lillyonna Armstead/Extender: [name] Covington in Treatment: 10 Wound Status Wound Number: 5 Primary Arterial Insufficiency Ulcer Etiology: Wound Location: Left Toe Great - Distal Wound Open Wounding Event: Gradually Appeared Status: Date Acquired: 07/17/2017 Comorbid Anemia, Arrhythmia, Congestive Heart Failure, Weeks Of Treatment: 5 History: Hypertension, Myocardial Infarction, Peripheral Clustered Wound: No Arterial Disease, Neuropathy Photos Photo Uploaded [name]: [name] on 08/24/2017  16:26:28 Wound Measurements Length: (cm) 4.5 Width: (cm) 3 Depth: (cm) 0.1 Area: (cm) 10.603 Volume: (cm) 1.06 % Reduction in Area: -574.9% % Reduction in Volume: -6525% Epithelialization: None Tunneling: No Undermining: No Wound Description Classification: Partial Thickness Wound Margin: Flat and Intact Exudate Amount: Large Exudate Type: Serous Exudate Color: amber Foul Odor After Cleansing: No Slough/Fibrino Yes Wound Bed Granulation Amount: None Present (0%) Exposed Structure Necrotic Amount: Large (67-100%) Fascia Exposed: No Necrotic Quality: Eschar, Adherent Slough Fat Layer (Subcutaneous Tissue) Exposed: No Tendon Exposed: No Muscle Exposed: No Joint Exposed: No Bone Exposed: No Periwound Skin Texture Castles, Gayathri G. ([id number]) Texture Color No Abnormalities Noted: No No Abnormalities Noted: No Callus: Yes Atrophie Blanche: No Crepitus: No Cyanosis: No Excoriation: No Ecchymosis: No Induration: Yes Erythema: No Rash: No Hemosiderin Staining: No Scarring: Yes Mottled: No Pallor: No Moisture Rubor: No No Abnormalities Noted: No Dry / Scaly: Yes Temperature / Pain Maceration: No Temperature: No Abnormality Tenderness on Palpation: Yes Wound Preparation Ulcer Cleansing: Rinsed/Irrigated with Saline Topical Anesthetic Applied: Other: lidocaine 4%, Treatment Notes Wound #5 (Left, Distal Toe Great) 1. Cleansed with: Clean wound with Normal Saline 2. Anesthetic Topical Lidocaine 4% cream to wound bed prior to debridement 4. Dressing Applied: Other dressing (specify in notes) 5. Secondary Dressing Applied ABD Pad Notes betadine on wounds, silvercell, kerlix wrap Electronic Signature(s) Signed: 08/24/2017 4:30:35 PM [name]: [name]: [name] on 08/24/2017 15:45:14 Caltagirone, [name] ([id number]) -------------------------------------------------------------------------------- Wound Assessment Details Patient Name:  [name] Date of Service: 08/24/2017 3:15 PM Medical Record Number: [id number] Patient Account Number: [id number] Date of Birth/Sex: 07-01-1945 (72 y.o. Female) Treating [name]: [name], [name] Primary Care Lue Sykora: [name] Other Clinician: Referring Jean Alejos: [name] Treating Jaye Polidori/Extender: Maxwell Caul Weeks in Treatment: 10 Wound Status Wound Number: 6 Primary Arterial Insufficiency Ulcer Etiology: Wound Location: Left Toe Great - Lateral Wound Open Wounding Event: Gradually Appeared Status: Date Acquired: 07/17/2017 Comorbid Anemia, Arrhythmia, Congestive Heart Failure, Weeks Of Treatment: 5 History: Hypertension, Myocardial Infarction, Peripheral Clustered Wound: No Arterial Disease,  Neuropathy Photos Photo Uploaded [name]: [name] on 08/24/2017 16:27:38 Wound Measurements Length: (cm) 1 Width: (cm) 0.6 Depth: (cm) 0.3 Area: (cm) 0.471 Volume: (cm) 0.141 % Reduction in Area: 84.9% % Reduction in Volume: 54.7% Epithelialization: None Tunneling: No Undermining: No Wound Description Classification: Partial Thickness Wound Margin: Distinct, outline attached Exudate Amount: Medium Exudate Type: Purulent Exudate Color: yellow, brown, green Foul Odor After Cleansing: Yes Due to Product Use: No Slough/Fibrino Yes Wound Bed Granulation Amount: None Present (0%) Exposed Structure Necrotic Amount: Large (67-100%) Fascia Exposed: No Necrotic Quality: Eschar, Adherent Slough Fat Layer (Subcutaneous Tissue) Exposed: No Tendon Exposed: No Muscle Exposed: No Joint Exposed: No Bone Exposed: No Periwound Skin Texture Iott, Florice G. ([id number]) Texture Color No Abnormalities Noted: No No Abnormalities Noted: No Callus: Yes Atrophie Blanche: No Crepitus: Yes Cyanosis: No Excoriation: No Ecchymosis: No Induration: Yes Erythema: No Rash: No Hemosiderin Staining: No Scarring: No Mottled: No Pallor: No Moisture Rubor: No No  Abnormalities Noted: No Dry / Scaly: No Temperature / Pain Maceration: Yes Temperature: No Abnormality Tenderness on Palpation: Yes Wound Preparation Ulcer Cleansing: Rinsed/Irrigated with Saline Topical Anesthetic Applied: Other: lidocaine 4%, Treatment Notes Wound #6 (Left, Lateral Toe Great) 1. Cleansed with: Clean wound with Normal Saline 2. Anesthetic Topical Lidocaine 4% cream to wound bed prior to debridement 4. Dressing Applied: Other dressing (specify in notes) 5. Secondary Dressing Applied ABD Pad Notes betadine on wounds, silvercell, kerlix wrap Electronic Signature(s) Signed: 08/24/2017 4:30:35 PM [name]: [name]: [name] on 08/24/2017 15:46:20 [name], [name] ([id number]) -------------------------------------------------------------------------------- Wound Assessment Details Patient Name: [name] Date of Service: 08/24/2017 3:15 PM Medical Record Number: [id number] Patient Account Number: [id number] Date of Birth/Sex: [birth year] (72 y.o. Female) Treating [name]: [name], [name] Primary Care Graclyn Lawther: [name] Other Clinician: Referring Lucciano Vitali: [name] Treating Jennings Corado/Extender: [name] Jasper in Treatment: 10 Wound Status Wound Number: 7 Primary Arterial Insufficiency Ulcer Etiology: Wound Location: Left Toe Third Wound Open Wounding Event: Gradually Appeared Status: Date Acquired: 07/16/2017 Comorbid Anemia, Arrhythmia, Congestive Heart Failure, Weeks Of Treatment: 5 History: Hypertension, Myocardial Infarction, Peripheral Clustered Wound: No Arterial Disease, Neuropathy Photos Photo Uploaded [name]: [name] on 08/24/2017 16:27:41 Wound Measurements Length: (cm) 2 Width: (cm) 2 Depth: (cm) 0.1 Area: (cm) 3.142 Volume: (cm) 0.314 % Reduction in Area: -316.7% % Reduction in Volume: -318.7% Epithelialization: None Tunneling: No Undermining: No Wound Description Classification:  Partial Thickness Wound Margin: Distinct, outline attached Exudate Amount: Large Exudate Type: Serous Exudate Color: amber Foul Odor After Cleansing: Yes Due to Product Use: No Slough/Fibrino Yes Wound Bed Granulation Amount: None Present (0%) Exposed Structure Necrotic Amount: Large (67-100%) Fascia Exposed: No Necrotic Quality: Eschar, Adherent Slough Fat Layer (Subcutaneous Tissue) Exposed: No Tendon Exposed: No Muscle Exposed: No Joint Exposed: No Bone Exposed: No Periwound Skin Texture [name], [name] ([id number]) Texture Color No Abnormalities Noted: No No Abnormalities Noted: No Callus: Yes Atrophie Blanche: No Crepitus: No Cyanosis: No Excoriation: No Ecchymosis: No Induration: No Erythema: No Rash: No Hemosiderin Staining: No Scarring: No Mottled: No Pallor: No Moisture Rubor: No No Abnormalities Noted: No Dry / Scaly: No Temperature / Pain Maceration: No Temperature: No Abnormality Tenderness on Palpation: Yes Wound Preparation Ulcer Cleansing: Rinsed/Irrigated with Saline Topical Anesthetic Applied: Other: lidocaine 4%, Treatment Notes Wound #7 (Left Toe Third) 1. Cleansed with: Clean wound with Normal Saline 2. Anesthetic Topical Lidocaine 4% cream to wound bed prior to debridement 4. Dressing Applied: Other dressing (specify in notes) 5. Secondary Dressing Applied ABD  Pad Notes betadine on wounds, silvercell, kerlix wrap Electronic Signature(s) Signed: 08/24/2017 4:30:35 PM [name]: [name]: [name] on 08/24/2017 15:47:29 [name], [name] ([id number]) -------------------------------------------------------------------------------- Vitals Details Patient Name: [name] Date of Service: 08/24/2017 3:15 PM Medical Record Number: [id number] Patient Account Number: [id number] Date of Birth/Sex: 05-16-1946 (72 y.o. Female) Treating [name]: [name], [name] Primary Care Adaeze Better: [name] Other  Clinician: Referring Shikha Bibb: [name] Treating Ximena Todaro/Extender: [name] Wilburton Number One in Treatment: 10 Vital Signs Time Taken: 15:33 Temperature (F): 98.2 Height (in): 63 Pulse (bpm): 79 Weight (lbs): 176 Respiratory Rate (breaths/min): 18 Body Mass Index (BMI): 31.2 Blood Pressure (mmHg): 93/41 Reference Range: 80 - 120 mg / dl Pulse Oximetry (%): 782 Notes Took BP manually and it was 84/62 and made Dr. Leanord Hawking aware. Electronic Signature(s) Signed: 08/24/2017 4:30:35 PM [name]: [name]: [name] on 08/24/2017 15:38:13

## 2017-09-07 ENCOUNTER — Other Ambulatory Visit
Admission: RE | Admit: 2017-09-07 | Discharge: 2017-09-07 | Disposition: A | Discharge status: Home / Self Care | Payer: Medicare Other | Source: Ambulatory Visit | Attending: Internal Medicine | Admitting: Internal Medicine

## 2017-09-07 ENCOUNTER — Encounter: Payer: Medicare Other | Attending: Internal Medicine | Admitting: Internal Medicine

## 2017-09-07 DIAGNOSIS — I70661 Atherosclerosis of nonbiological bypass graft(s) of the extremities with gangrene, right leg: Secondary | ICD-10-CM | POA: Insufficient documentation

## 2017-09-07 DIAGNOSIS — Z955 Presence of coronary angioplasty implant and graft: Secondary | ICD-10-CM | POA: Insufficient documentation

## 2017-09-07 DIAGNOSIS — L97512 Non-pressure chronic ulcer of other part of right foot with fat layer exposed: Secondary | ICD-10-CM | POA: Insufficient documentation

## 2017-09-07 DIAGNOSIS — I2510 Atherosclerotic heart disease of native coronary artery without angina pectoris: Secondary | ICD-10-CM | POA: Diagnosis not present

## 2017-09-07 DIAGNOSIS — Z87891 Personal history of nicotine dependence: Secondary | ICD-10-CM | POA: Diagnosis not present

## 2017-09-07 DIAGNOSIS — I255 Ischemic cardiomyopathy: Secondary | ICD-10-CM | POA: Diagnosis not present

## 2017-09-07 DIAGNOSIS — I70235 Atherosclerosis of native arteries of right leg with ulceration of other part of foot: Secondary | ICD-10-CM | POA: Diagnosis not present

## 2017-09-07 DIAGNOSIS — E785 Hyperlipidemia, unspecified: Secondary | ICD-10-CM | POA: Diagnosis not present

## 2017-09-07 DIAGNOSIS — I509 Heart failure, unspecified: Secondary | ICD-10-CM | POA: Diagnosis not present

## 2017-09-07 DIAGNOSIS — E11621 Type 2 diabetes mellitus with foot ulcer: Secondary | ICD-10-CM | POA: Insufficient documentation

## 2017-09-07 DIAGNOSIS — Z7902 Long term (current) use of antithrombotics/antiplatelets: Secondary | ICD-10-CM | POA: Diagnosis not present

## 2017-09-07 DIAGNOSIS — B999 Unspecified infectious disease: Secondary | ICD-10-CM | POA: Diagnosis present

## 2017-09-07 DIAGNOSIS — Z951 Presence of aortocoronary bypass graft: Secondary | ICD-10-CM | POA: Insufficient documentation

## 2017-09-08 ENCOUNTER — Telehealth (HOSPITAL_COMMUNITY): Payer: Self-pay | Admitting: *Deleted

## 2017-09-08 ENCOUNTER — Ambulatory Visit (HOSPITAL_COMMUNITY)
Admission: RE | Admit: 2017-09-08 | Discharge: 2017-09-08 | Disposition: A | Discharge status: Home / Self Care | Payer: Medicare Other | Source: Ambulatory Visit | Attending: Internal Medicine | Admitting: Internal Medicine

## 2017-09-08 ENCOUNTER — Encounter (HOSPITAL_COMMUNITY): Payer: Self-pay | Admitting: Internal Medicine

## 2017-09-08 VITALS — BP 119/57 | HR 68 | Wt 174.2 lb

## 2017-09-08 DIAGNOSIS — K219 Gastro-esophageal reflux disease without esophagitis: Secondary | ICD-10-CM | POA: Diagnosis not present

## 2017-09-08 DIAGNOSIS — I480 Paroxysmal atrial fibrillation: Secondary | ICD-10-CM | POA: Insufficient documentation

## 2017-09-08 DIAGNOSIS — Z951 Presence of aortocoronary bypass graft: Secondary | ICD-10-CM | POA: Insufficient documentation

## 2017-09-08 DIAGNOSIS — J45909 Unspecified asthma, uncomplicated: Secondary | ICD-10-CM | POA: Insufficient documentation

## 2017-09-08 DIAGNOSIS — E039 Hypothyroidism, unspecified: Secondary | ICD-10-CM | POA: Diagnosis not present

## 2017-09-08 DIAGNOSIS — I5042 Chronic combined systolic (congestive) and diastolic (congestive) heart failure: Secondary | ICD-10-CM | POA: Insufficient documentation

## 2017-09-08 DIAGNOSIS — I739 Peripheral vascular disease, unspecified: Secondary | ICD-10-CM | POA: Diagnosis not present

## 2017-09-08 DIAGNOSIS — Z7901 Long term (current) use of anticoagulants: Secondary | ICD-10-CM | POA: Diagnosis not present

## 2017-09-08 DIAGNOSIS — Z7982 Long term (current) use of aspirin: Secondary | ICD-10-CM | POA: Diagnosis not present

## 2017-09-08 DIAGNOSIS — Z87891 Personal history of nicotine dependence: Secondary | ICD-10-CM | POA: Diagnosis not present

## 2017-09-08 DIAGNOSIS — I481 Persistent atrial fibrillation: Secondary | ICD-10-CM | POA: Diagnosis not present

## 2017-09-08 DIAGNOSIS — I4819 Other persistent atrial fibrillation: Secondary | ICD-10-CM

## 2017-09-08 DIAGNOSIS — Z7902 Long term (current) use of antithrombotics/antiplatelets: Secondary | ICD-10-CM | POA: Diagnosis not present

## 2017-09-08 DIAGNOSIS — I110 Hypertensive heart disease with heart failure: Secondary | ICD-10-CM | POA: Insufficient documentation

## 2017-09-08 DIAGNOSIS — I4892 Unspecified atrial flutter: Secondary | ICD-10-CM | POA: Diagnosis not present

## 2017-09-08 DIAGNOSIS — I25119 Atherosclerotic heart disease of native coronary artery with unspecified angina pectoris: Secondary | ICD-10-CM | POA: Diagnosis not present

## 2017-09-08 DIAGNOSIS — I2510 Atherosclerotic heart disease of native coronary artery without angina pectoris: Secondary | ICD-10-CM | POA: Insufficient documentation

## 2017-09-08 DIAGNOSIS — I252 Old myocardial infarction: Secondary | ICD-10-CM | POA: Insufficient documentation

## 2017-09-08 DIAGNOSIS — Z79899 Other long term (current) drug therapy: Secondary | ICD-10-CM | POA: Insufficient documentation

## 2017-09-08 DIAGNOSIS — E669 Obesity, unspecified: Secondary | ICD-10-CM | POA: Diagnosis not present

## 2017-09-08 DIAGNOSIS — Z6830 Body mass index (BMI) 30.0-30.9, adult: Secondary | ICD-10-CM | POA: Insufficient documentation

## 2017-09-08 DIAGNOSIS — I5022 Chronic systolic (congestive) heart failure: Secondary | ICD-10-CM

## 2017-09-08 DIAGNOSIS — I255 Ischemic cardiomyopathy: Secondary | ICD-10-CM | POA: Diagnosis not present

## 2017-09-08 DIAGNOSIS — E785 Hyperlipidemia, unspecified: Secondary | ICD-10-CM | POA: Diagnosis not present

## 2017-09-08 DIAGNOSIS — Z888 Allergy status to other drugs, medicaments and biological substances status: Secondary | ICD-10-CM | POA: Diagnosis not present

## 2017-09-08 DIAGNOSIS — Z885 Allergy status to narcotic agent status: Secondary | ICD-10-CM | POA: Diagnosis not present

## 2017-09-08 DIAGNOSIS — E1151 Type 2 diabetes mellitus with diabetic peripheral angiopathy without gangrene: Secondary | ICD-10-CM | POA: Diagnosis not present

## 2017-09-08 MED ORDER — TORSEMIDE 20 MG PO TABS: 60.0000 mg | ORAL_TABLET | Freq: Two times a day (BID) | ORAL | 6 refills | Status: AC

## 2017-09-08 NOTE — Progress Notes (Signed)
ReDS Vest - 09/08/17 1400      ReDS Vest   MR   No    Estimated volume prior to reading  High 41 per DB   41 per DB   Fitting Posture  Sitting    Height Marker  Short    Ruler Value  15    Center Strip  Aligned    ReDS Value  35

## 2017-09-08 NOTE — Patient Instructions (Addendum)
Stop Aspirin  Increase Torsemide 80 mg (4 tabs) in AM and 40 mg (2 tabs) in PM for 2 DAYS ONLY, then take 60 mg (3 tabs) Twice daily   Lab in 1 week  Your physician recommends that you schedule a follow-up appointment in: 3 months

## 2017-09-08 NOTE — Telephone Encounter (Signed)
Opened in error

## 2017-09-08 NOTE — Progress Notes (Signed)
Advanced Heart Failure Clinic Note   Primary Care: [name] Alcide, MD Primary Cardiologist: Dr. Diona Browner  HPI: [name] is a 72 y.o. female with a hx of Chronic dyspnea on exertion, CAD, multivessel status post coronary artery bypass grafting in 2003, chronic combined systolic and diastolic heart failure, hypertension, history of atrial flutter with cardioversion in 2016, hyperlipidemia, hypothyroidism, and ischemic cardiomyopathy.  She presented to the ED on 04/07/17 with complaints of chest pain and back pain described as stabbing pain in the left scapula, reminiscent of pain she experienced prior to MI in the past. No associated diaphoresis. The patient was recently seen by Dr. Diona Browner on 03/28/2017 with symptoms of dyspnea and discomfort, was ordered a [employer]. The test was found to be high risk with a large anterior apical and lateral wall infarct. EKG revealed atrial fibrillation with T-wave flattening in the lateral, and inferior leads, heart rate of 87 bpm. Essentially unchanged from prior EKG in July 2018. She underwent LHC that showed severe 3 vessel CAD, 1/3 patent grafts. There were no good targets for PCI. It was felt that her atrial fibrillation was contributing to her low EF. She was started on Amiodarone and underwent DCCV with successful return to NSR. Echo showed EF 20-25%, severely HK RV. Discharge weight was 175 pounds.   Admitted with [name] critical limb ischemia. Underwent DES R popliteal artery. By Dr. Allyson Sabal in 12/18  Developed pain and non-healing ulcer in LLE. Underwent artertogram on 07/14/2017 with Dr. Imogene Burn. Mild diffuse disease. No interventions due to small tibial arteries. Post Tib arteries occluded. Now pending possible BKA.  ABI 0.31 on 07/20/2017. Saw Dr Leanord Hawking 07/20/2017 and at that time Left foot wounds were concerning. Increased pain and necrosis noted.  Cira Servant was concern that she may require LBKA.   She was admitted 2/4-08/02/17 with critical lower  limb ischemia. She underwent successful left peroneal CTO PTA and DES x2 with Dr. Allyson Sabal.   Today she returns for HF follow up. Overall, she is feeling okay. She is having more SOB with walking, which is usually relieved by albuterol inhaler but she recently ran out. No cough, fever, or chills. SBP occasionally <80 at home (5x in last 2 weeks). Sometimes she is dizzy with this. Took an extra torsemide once in the last two months for LLE edema with good response. Had an episode of CP after eating and thinks it was indigestion. Decreased appetite. Sleeps on 2 pillows, no PND. Has HH PT and is going to wound clinic every other week. Her wounds are healing slowly. Weights at home: 169-171 lbs. Taking medications, but has a tough time swallowing potassium and magnesium so misses these sometimes. She is mostly compliant with diet. She tries to limit fluids, but snacks on ice all day.    Past Medical History:  Diagnosis Date  . Asthma   . CAD (coronary artery disease)    Multivessel status post CABG in 2003  . Chronic combined systolic (congestive) and diastolic (congestive) heart failure (HCC) 12/25/2016  . Chronic systolic heart failure (HCC)   . Essential hypertension   . GERD (gastroesophageal reflux disease)   . Gum disease   . History of atrial flutter    Cardioversion 2016 in [place]  . History of colonic polyps   . History of goiter   . Hyperlipidemia   . Hypothyroidism   . Ischemic cardiomyopathy 12/25/2016  . Myocardial infarction (HCC)    2003  . Peripheral arterial disease (HCC)  Stent revascularization 2015 - details not clear  . Renal insufficiency   . Type 2 diabetes mellitus (HCC)     Current Outpatient Medications  Medication Sig Dispense Refill  . acetaminophen (TYLENOL) 500 MG tablet Take 1,000 mg by mouth daily as needed for moderate pain.     Marland Kitchen albuterol (PROVENTIL HFA;VENTOLIN HFA) 108 (90 Base) MCG/ACT inhaler Inhale 2 puffs into the lungs every 6 (six) hours as needed  for wheezing or shortness of breath.     Marland Kitchen amiodarone (PACERONE) 200 MG tablet Take 1 tablet (200 mg total) daily by mouth. 90 tablet 3  . apixaban (ELIQUIS) 5 MG TABS tablet Take 1 tablet (5 mg total) by mouth 2 (two) times daily. 60 tablet 8  . carvedilol (COREG) 6.25 MG tablet Take 1 tablet (6.25 mg total) by mouth 2 (two) times daily. 60 tablet 3  . ciprofloxacin (CIPRO) 500 MG tablet TK 1 T PO  BID FOR 7 DAYS WITH DOXYCYCLINE  0  . clopidogrel (PLAVIX) 75 MG tablet Take 1 tablet (75 mg total) by mouth daily. 30 tablet 11  . doxycycline (MONODOX) 100 MG capsule TK ONE C PO BID  0  . HYDROcodone-acetaminophen (NORCO) 10-325 MG tablet TK 1 T PO UP TO QID PRN P  0  . levothyroxine (SYNTHROID, LEVOTHROID) 175 MCG tablet Take 175 mcg by mouth daily before breakfast.    . losartan (COZAAR) 25 MG tablet Take 0.5 tablets (12.5 mg total) by mouth daily. 30 tablet 6  . Magnesium 250 MG TABS Take 250 mg by mouth daily.     . magnesium hydroxide (MILK OF MAGNESIA) 400 MG/5ML suspension Take 30 mLs by mouth daily as needed for mild constipation.    . nitroGLYCERIN (NITRODUR - DOSED IN MG/24 HR) 0.2 mg/hr patch PLACE 1 PATCH ONTO THE SKIN DAILY AS NEEDED FOR CHEST PAINS 90 patch 3  . pantoprazole (PROTONIX) 40 MG tablet Take 1 tablet (40 mg total) by mouth daily. 30 tablet 3  . potassium chloride (K-DUR,KLOR-CON) 10 MEQ tablet Take 2 tablets (20 mEq total) by mouth 2 (two) times daily. 120 tablet 6  . promethazine (PHENERGAN) 25 MG tablet Take 25 mg by mouth every 4 (four) hours as needed for nausea/vomiting.  0  . torsemide (DEMADEX) 20 MG tablet Take 2 tablets (40 mg total) by mouth 2 (two) times daily. May also take 1 tablet (20 mg total) as needed (for 3lb weight gain in 24 hours). 150 tablet 6   No current facility-administered medications for this encounter.     Allergies  Allergen Reactions  . Oxycodone Other (See Comments)    hallucinations   . Pollen Extract Other (See Comments)    Sneezing,  running nose.  . Ranexa [Ranolazine] Nausea Only    Headache and nausea  . Statins Other (See Comments)    Cramping, turns skin yellow      Social History   Socioeconomic History  . Marital status: Married    Spouse name: Not on file  . Number of children: Not on file  . Years of education: Not on file  . Highest education level: Not on file  Social Needs  . Financial resource strain: Not very hard  . Food insecurity - worry: Never true  . Food insecurity - inability: Never true  . Transportation needs - medical: No  . Transportation needs - non-medical: No  Occupational History  . Not on file  Tobacco Use  . Smoking status: Former Smoker  Packs/day: 1.00    Years: 21.00    Pack years: 21.00    Types: Cigarettes    Last attempt to quit: 1988    Years since quitting: 31.2  . Smokeless tobacco: Never Used  Substance and Sexual Activity  . Alcohol use: No  . Drug use: No  . Sexual activity: Not Currently    Partners: Male  Other Topics Concern  . Not on file  Social History Narrative  . Not on file      Family History  Problem Relation Age of Onset  . Stroke Mother   . Heart attack Father   . Breast cancer Maternal Aunt   . Breast cancer Paternal Aunt     Vitals:   09/08/17 1358  BP: (!) 119/57  Pulse: 68  Weight: 174 lb 3.2 oz (79 kg)   Filed Weights   09/08/17 1358  Weight: 174 lb 3.2 oz (79 kg)     PHYSICAL EXAM: General: Well appearing. No resp difficulty. HEENT: Normal Neck: Supple. JVP 7-8. Carotids 2+ bilat; no bruits. No thyromegaly or nodule noted. Cor: PMI nondisplaced. Mildly IRR, No M/G/R noted Lungs: CTAB, normal effort. Abdomen: Soft, non-tender, non-distended, no HSM. No bruits or masses. +BS  Extremities: No cyanosis, clubbing, or rash. R and LLE 1-2+ edema, orthotic shoes. Feet wrapped in kerlex.  Neuro: Alert & orientedx3, cranial nerves grossly intact. moves all 4 extremities w/o difficulty. Affect pleasant  EKG: atrial  fibrillation, 77 bpm   ASSESSMENT & PLAN: 1. Chronic combined systolic and diastolic HF:  - TEE 04/12/17 LVEF 20-25%, severely HK RV, Mod LAE, Severe RAE, Mild MR, Moderate TR, Trivial PI. Has previously refused ICD. EF previously 30-35%, NICM/ICM. Suspect that atrial fib/flutter has worsened CM. S/p DCCV in 03/2017.  - NYHA III - Volume status mildly elevated on exam. Vest reading 35% - Take extra torsemide 80 mg am and 60 mg pm x2 days, then increase torsemide to 60 mg BID.   - Continue Spiro 12.5 mg daily.  - Continue losartan 12.5 mg daily - Continue carvedilol 6.25 mg twice a day.   2. CAD- CABG in 2003. S/P LHC 10/18  1/3 grafts patent. No targets for intervention  - No s/s ischemia. Total Cholesterol 153, HDL 31, LDL 98. TGs 122. Not on statin due to cramping. Declined Zetia.  - DC aspirin with plavix and eliquis.   3. PAF - had successful cardioversion 2016 in [place].  - Successful DCCV 04/12/17 - EKG shows rate-controlled afib today.  - Continue Eliquis for anticoagulation. No s/s bleeding  5. Hypothyroidsim - Continue synthroid  6. Obesity:  -Body mass index is 30.86 kg/m.  7. Suspected OSA - Set up sleep study down the road.   8. PAD- S/P Angiogram with DES to R popliteal 12/18. On plavix. No statin due to allergy.  - 08/01/17: s/p left peroneal CTO PTA and DES x2. Going to wound clinic once every 2 weeks - She is on asa, plavix, and eliquis. No s/s bleeding.  - Can DC aspirin since she is 30 days out from stenting  BMET in 1 week Torsemide 80 mg am, 60 mg pm x 2 days, then 60 mg BID Follow up in 3 months  Alford Highland, NP 09/08/17   Patient seen and examined with the above-signed Advanced Practice Provider and/or Housestaff. I personally reviewed laboratory data, imaging studies and relevant notes. I independently examined the patient and formulated the important aspects of the plan. I have edited the  note to reflect any of my changes or salient points. I have  personally discussed the plan with the patient and/or family.  Volume status mildly elevated on exam but REDS on high end of normal range. Will increase diuretics gently as above. LE wounds healing slowly. Continue to follow with wound clinic. She remains in AF but is rate controlled and tolerating well. Will continue Plavix and Eliquis. Stop ASA.   Arvilla Meresaniel Elya Diloreto, MD  8:07 PM

## 2017-09-09 ENCOUNTER — Other Ambulatory Visit (HOSPITAL_COMMUNITY): Payer: Self-pay | Admitting: Pharmacist

## 2017-09-09 MED ORDER — ALBUTEROL SULFATE HFA 108 (90 BASE) MCG/ACT IN AERS: 2.0000 | INHALATION_SPRAY | Freq: Four times a day (QID) | RESPIRATORY_TRACT | 2 refills | Status: AC | PRN

## 2017-09-09 NOTE — Progress Notes (Signed)
[name], [name] ([id number]) Visit Report for 09/07/2017 HPI Details Patient Name: [name], [name] Date of Service: 09/07/2017 12:30 PM Medical Record Number: [id number] Patient Account Number: [id number] Date of Birth/Sex: 1945-07-03 (72 y.o. Female) Treating RN: [name] Primary Care Provider: Quintin Alto Other Clinician: Referring Provider: Quintin Alto Treating Provider/Extender: Altamese [place] in Treatment: 12 History of Present Illness Location: right foot toes Quality: Patient reports experiencing a sharp pain to affected area(s). Severity: Patient states wound are getting worse. Duration: Patient has had the wound for < 2 weeks prior to presenting for treatment Timing: Pain in wound is constant (hurts all the time) Context: The wound appeared gradually over time Modifying Factors: Other treatment(s) tried include:recent interventional process to place a stent in her right lower extremity for critical limb ischemia Associated Signs and Symptoms: Patient reports having foul odor. HPI Description: 72 year old female who was referred by Dr. Nanetta [name], who performed a endovascular procedure on 05/26/2017, for critical limb ischemia. Her angiogram showed a 99% below the knee popliteal stenosis which is stented with a 4 mm drug eluting stent. On 06/10/2017 he noted that her pulses are palpable and her Dopplers have normalized. He referred her to wound care for further advice and put her on Keflex 500 mg twice daily for 10 days. She has a past medical history of asthma, coronary artery disease, CHF, hyperlipidemia, ischemic cardiomyopathy, peripheral arterial disease and type 2 diabetes mellitus. She is status post recent abdominal aortogram with right lower extremity stenting, cholecystectomy, CABG, parathyroidectomy, thyroidectomy. She is a former smoker and quit in 1988. She had an ABI performed on 06/10/2017 with the right ABI within normal range and the left ABI  shows mild lower extremity arterial disease. The right ABI was 1.0 and biphasic and the left ABI was 0.93 and biphasic. Toe pressures were abnormal with the right being 0.27 and left being 0.31. the duplex study done showed a patent right popliteal artery stent without evidence of focal stenosis or obstruction. of note the patient started having signs of critical limb ischemia somewhere at the end of November and after an urgent workup was taken up for limb salvage with a successfully placed stent by Dr. Nanetta [name], with good arterial perfusion postprocedure with normal ABIs noted on 06/10/2017. 06/22/17; patient was admitted to our clinic last week by Dr. Meyer [name].she is a type II diabetic. She had undergone a right popliteal DES on 11/29 for critical limb ischemia including erythema and pain of her right forefoot. She states about 3 days later she started developing ulcerations in predominantly the right foot but also the left third toe. 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 This is never really healed rib. Repeat ABIs on 12/14 showed a right ABI of 1 and a left ABI of 0.93 with toe pressures be markedly abnormal as noted above and Dr. Marcie [name] notes. The patient is still having a lot of pain making it very difficult for her to function at home this is worse on the right greater than left foot she does describe severe lancinating pain. She does not ambulate all that much and she finds it intolerable to wear footwear Her history is also notable for A. fib, severe cardiomyopathy with an ejection fraction of 25%_30%. Last echocardiogram in June of this year. According to Epic she is on a combination of Eliquis plavix and aspirin. 06/29/17; patient has an appointment with Dr. Imogene [name] of vascular surgery I believe  next week. This was arranged by her primary physician. She is still having a lot of pain in the right side in general most of her toes look  better however. Supposed to be using silver alginate however [facility] applying currently applied wet to dry dressings. 07/06/17; the patient went for appointment with Dr. Imogene [name]. She is status post right popliteal stenting with peroneal artery angioplasty with Dr. Gery [name]. She presented to our clinic with bilateral lower extremity wounds in her toes which are exceptionally painful. Dr. Imogene [name] put her on a course of antibiotics. Her arterial studies from 06/10/17 showed an ABI in the right of 1 and ABI in the left of 0.8. Waveforms at the dorsalis pedis and posterior tibial were biphasic. However routine ABIs were [name], [name]. ([id number]) 0.31 bilaterally. The patient has severe bilateral rest pain. 07/20/17; the patient had her arteriogram by Dr. Imogene [name] on 07/14/17. Based on the images the patient was not felt to require any interventions and no further interventions were possible due to small size of the tibial arteries. The posterior tibial arteries bilaterally were occluded heel arteries were the dominant runoff which were miniscule. Anterior tibials both showed subtotal occlusion in the mid segment and these were small vessels. Paradoxically the patient complains of a lot less pain in the right foot however she is having a lot of pain in the left first and left third toes. She is managing this with activity limitation and narcotics. 07/27/17; I've spoken to Dr. Allyson Sabal today and he is going to do another arteriogram on the left leg early next week with possibility of retrograde arterial intervention. The patient is still having a lot of pain in the left third toe and left first toe which is worse when she puts her leg up i.e. lies in bed etc. 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 She has done better on the right which is the side where she had the DES stent placed in the right popliteal. She still has a wound on the medial aspect of the third toe and black eschar at the tip of the fourth  and fifth toes however most of the rest of the right foot looks better. 08/10/17; the patient was taken for angiography on 08/01/17; she had a successful left peroneal PTA and drug eluding stent now has a widely patent peroneal supply collateral to the distal posterior tibial that feeds the dorsal pedal arch. The patient is on antiplatelet therapy. Unfortunately she has not had much relief of pain in the left great and third toe. She has had an increase in her hydrocodone by her primary physician. She had follow-up noninvasive Doppler test done yesterday although I cannot pull up these results 08/17/17; patient returns to clinic. She states she feels better. She's had an increase in her hydrocodone to 10/325 she is taking a half a tablet every 3 hours and occasionally a whole one at night which allows her to sleep through the night. She is not having pain in the right foot but is having pain in the left. She went to see Dr. Allyson Sabal yesterday. He noted that he performed perineal interventions on 2//19 with 2 overlapping drug-eluting stents for perineal CTO. Her left posterior tibial is occluded as was her left anterior tibial at the level of the ankle. Recent Doppler showed patent peroneal stent. He takes it me yesterday to report the patient was in less pain which she seems to verify although this may be because of an increase  in strength and frequency of her narcotics culture the drainage I did last week showed both methicillin sensitive staph aureus and Pseudomonas. I sent in ciprofloxacin I believe for 10 days which should cover both of these. This may be partially why the pain in the right great toe is better 08/24/17; the patient is generally better. Especially the areas on the right foot where there are still some ischemic change on the right third and right fifth toes but her foot is warm here and I [name]'t think is contributing much to her pain. She still has considerable dry gangrene on the right  great toe with a open area medially. Also the left third toe. Things are still very tender here but considerably better. She doesn't have any purulent drainage or cellulitis and she is finished the antibiotics I gave her. She arrived in clinic with a low blood pressure she is having some chest pain 09/07/17; the patient continues to make decent improvement. She is in a lot less pain only using one half of a hydrocodone 10/325 3 times a day. There is really nothing open except for a small eschar on the tip of her right fourth toe. She still has thick eschar/dry gangrene on the left first and third toes although the pain here is a lot better as well. Electronic Signature(s) Signed: 09/07/2017 5:42:24 PM By: Baltazar Najjar MD Entered By: Baltazar Najjar on 09/07/2017 15:55:29 [name], [name] ([id number]) -------------------------------------------------------------------------------- Physical Exam Details Patient Name: [name] Date of Service: 09/07/2017 12:30 PM Medical Record Number: [id number] Patient Account Number: [id number] Date of Birth/Sex: 07/16/1945 (71 y.o. Female) Treating RN: [name] Primary Care Provider: Quintin Alto Other Clinician: Referring Provider: Quintin Alto Treating Provider/Extender: Altamese [place] in Treatment: 12 Constitutional Patient is hypotensive.. Pulse regular and within target range for patient.. Temperature is normal and within the target range for the patient.Marland Kitchen appears in no distress. Eyes Conjunctivae clear. No discharge. Respiratory Respiratory effort is easy and symmetric bilaterally. Rate is normal at rest and on room air.. Cardiovascular absent popliteal pulses.. Pedal pulses absent bilaterally.. Lymphatic none palpable in the popliteal or inguinal area. Musculoskeletal no issues with the left first interphalangeal joint or the left PIP and DIP #3. Integumentary (Hair, Skin) distal feet are cool.. Notes wound exam;  the left foot has dry gangrene at the tip of the first toe and the tip of the third toe. Everything else looks stable here. She is painting this with Betadine using silver alginate between the toes. I've cautioned her to make sure that the second toe isn't damaged by the surrounding eschar. oThe only thing she has on the right foot currently is a scant amount of dry eschar over the fourth toe. Electronic Signature(s) Signed: 09/07/2017 5:42:24 PM By: Baltazar Najjar MD Entered By: Baltazar Najjar on 09/07/2017 16:03:12 [place], [name] ([id number]) -------------------------------------------------------------------------------- Physician Orders Details Patient Name: [name] Date of Service: 09/07/2017 12:30 PM Medical Record Number: [id number] Patient Account Number: [id number] Date of Birth/Sex: [birth year] (71 y.o. Female) Treating RN: [name] Primary Care Provider: Quintin Alto Other Clinician: Referring Provider: Quintin Alto Treating Provider/Extender: Altamese [place] in Treatment: 12 Verbal / Phone Orders: No Diagnosis Coding Wound Cleansing Wound #1 Right,Distal Toe - Web between 2nd and 3rd o Clean wound with Normal Saline. o May Shower, gently pat wound dry prior to applying new dressing. Wound #2 Right Toe - Web between 3rd and 4th o Clean wound with Normal Saline. o May Shower, gently  pat wound dry prior to applying new dressing. Wound #4 Right,Dorsal Toe Fifth o Clean wound with Normal Saline. o May Shower, gently pat wound dry prior to applying new dressing. Wound #5 Left,Distal Toe Great o Clean wound with Normal Saline. o May Shower, gently pat wound dry prior to applying new dressing. Wound #6 Left,Lateral Toe Great o Clean wound with Normal Saline. o May Shower, gently pat wound dry prior to applying new dressing. Wound #7 Left Toe Third o Clean wound with Normal Saline. o May Shower, gently pat wound dry prior to  applying new dressing. Anesthetic (add to Medication List) Wound #1 Right,Distal Toe - Web between 2nd and 3rd o Topical Lidocaine 4% cream applied to wound bed prior to debridement (In Clinic Only). Wound #2 Right Toe - Web between 3rd and 4th o Topical Lidocaine 4% cream applied to wound bed prior to debridement (In Clinic Only). Wound #4 Right,Dorsal Toe Fifth o Topical Lidocaine 4% cream applied to wound bed prior to debridement (In Clinic Only). Wound #5 Left,Distal Toe Great o Topical Lidocaine 4% cream applied to wound bed prior to debridement (In Clinic Only). Wound #6 Left,Lateral Toe Great o Topical Lidocaine 4% cream applied to wound bed prior to debridement (In Clinic Only). Wound #7 Left Toe Third o Topical Lidocaine 4% cream applied to wound bed prior to debridement (In Clinic Only). Primary Wound Dressing [name], [name]. ([id number]) Wound #1 Right,Distal Toe - Web between 2nd and 3rd o Other: - betadine, silvercell toes separated Wound #2 Right Toe - Web between 3rd and 4th o Other: - betadine, silvercell toes separated Wound #4 Right,Dorsal Toe Fifth o Other: - betadine, silvercell toes separated Wound #5 Left,Distal Toe Great o Other: - betadine, silvercell toes separated Wound #6 Left,Lateral Toe Great o Other: - betadine, silvercell toes separated Wound #7 Left Toe Third o Other: - betadine, silvercell toes separated Secondary Dressing Wound #1 Right,Distal Toe - Web between 2nd and 3rd o ABD and Kerlix/Conform Wound #2 Right Toe - Web between 3rd and 4th o ABD and Kerlix/Conform Wound #4 Right,Dorsal Toe Fifth o ABD and Kerlix/Conform Wound #5 Left,Distal Toe Great o ABD and Kerlix/Conform Wound #6 Left,Lateral Toe Great o ABD and Kerlix/Conform Wound #7 Left Toe Third o ABD and Kerlix/Conform Dressing Change Frequency Wound #1 Right,Distal Toe - Web between 2nd and 3rd o Change dressing every other day. Wound #2  Right Toe - Web between 3rd and 4th o Change dressing every other day. Wound #4 Right,Dorsal Toe Fifth o Change dressing every other day. Wound #5 Left,Distal Toe Great o Change dressing every other day. Wound #6 Left,Lateral Toe Great o Change dressing every other day. Wound #7 Left Toe Third o Change dressing every other day. Follow-up Appointments [name], [name] COWMAN ([id number]) Wound #1 Right,Distal Toe - Web between 2nd and 3rd o Return Appointment in 2 weeks. Wound #2 Right Toe - Web between 3rd and 4th o Return Appointment in 2 weeks. Wound #4 Right,Dorsal Toe Fifth o Return Appointment in 2 weeks. Wound #5 Left,Distal Toe Great o Return Appointment in 2 weeks. Wound #6 Left,Lateral Toe Great o Return Appointment in 2 weeks. Wound #7 Left Toe Third o Return Appointment in 2 weeks. Edema Control Wound #1 Right,Distal Toe - Web between 2nd and 3rd o Elevate legs to the level of the heart and pump ankles as often as possible Wound #2 Right Toe - Web between 3rd and 4th o Elevate legs to the level of the heart  and pump ankles as often as possible Wound #4 Right,Dorsal Toe Fifth o Elevate legs to the level of the heart and pump ankles as often as possible Wound #5 Left,Distal Toe Great o Elevate legs to the level of the heart and pump ankles as often as possible Wound #6 Left,Lateral Toe Great o Elevate legs to the level of the heart and pump ankles as often as possible Wound #7 Left Toe Third o Elevate legs to the level of the heart and pump ankles as often as possible Off-Loading Wound #1 Right,Distal Toe - Web between 2nd and 3rd o Open toe surgical shoe to: Wound #2 Right Toe - Web between 3rd and 4th o Open toe surgical shoe to: Wound #4 Right,Dorsal Toe Fifth o Open toe surgical shoe to: Wound #5 Left,Distal Toe Great o Open toe surgical shoe to: Wound #6 Left,Lateral Toe Great o Open toe surgical shoe to: Wound #7 Left  Toe Third o Open toe surgical shoe to: [name], [name]. ([id number]) Additional Orders / Instructions Wound #1 Right,Distal Toe - Web between 2nd and 3rd o Increase protein intake. Wound #2 Right Toe - Web between 3rd and 4th o Increase protein intake. Wound #4 Right,Dorsal Toe Fifth o Increase protein intake. Wound #5 Left,Distal Toe Great o Increase protein intake. Wound #6 Left,Lateral Toe Great o Increase protein intake. Wound #7 Left Toe Third o Increase protein intake. Home Health Wound #1 Right,Distal Toe - Web between 2nd and 3rd o Continue Home Health Visits - [facility] o Home Health Nurse may visit PRN to address patientos wound care needs. o FACE TO FACE ENCOUNTER: MEDICARE and MEDICAID PATIENTS: I certify that this patient is under my care and that I had a face-to-face encounter that meets the physician face-to-face encounter requirements with this patient on this date. The encounter with the patient was in whole or in part for the following MEDICAL CONDITION: (primary reason for Home Healthcare) MEDICAL NECESSITY: I certify, that based on my findings, NURSING services are a medically necessary home health service. HOME BOUND STATUS: I certify that my clinical findings support that this patient is homebound (i.e., Due to illness or injury, pt requires aid of supportive devices such as crutches, cane, wheelchairs, walkers, the use of special transportation or the assistance of another person to leave their place of residence. There is a normal inability to leave the home and doing so requires considerable and taxing effort. Other absences are for medical reasons / religious services and are infrequent or of short duration when for other reasons). o If current dressing causes regression in wound condition, may D/C ordered dressing product/s and apply Normal Saline Moist Dressing daily until next Wound Healing Center / Other MD appointment. Notify  Wound Healing Center of regression in wound condition at [phone]. o Please direct any NON-WOUND related issues/requests for orders to patient's Primary Care Physician Wound #2 Right Toe - Web between 3rd and 4th o Continue Home Health Visits - [facility] o Home Health Nurse may visit PRN to address patientos wound care needs. o FACE TO FACE ENCOUNTER: MEDICARE and MEDICAID PATIENTS: I certify that this patient is under my care and that I had a face-to-face encounter that meets the physician face-to-face encounter requirements with this patient on this date. The encounter with the patient was in whole or in part for the following MEDICAL CONDITION: (primary reason for Home Healthcare) MEDICAL NECESSITY: I certify, that based on my findings, NURSING services are a medically necessary home health service.  HOME BOUND STATUS: I certify that my clinical findings support that this patient is homebound (i.e., Due to illness or injury, pt requires aid of supportive devices such as crutches, cane, wheelchairs, walkers, the use of special transportation or the assistance of another person to leave their place of residence. There is a normal inability to leave the home and doing so requires considerable and taxing effort. Other absences are for medical reasons / religious services and are infrequent or of short duration when for other reasons). o If current dressing causes regression in wound condition, may D/C ordered dressing product/s and apply Normal Saline Moist Dressing daily until next Wound Healing Center / Other MD appointment. Notify Wound Healing Center of regression in wound condition at [phone]. o Please direct any NON-WOUND related issues/requests for orders to patient's Primary Care Physician Wound #4 Right,Dorsal Toe Fifth o Continue Home Health Visits - [facility] COLBI, SCHILTZ ([id number]) o Home Health Nurse may visit PRN to address patientos wound care  needs. o FACE TO FACE ENCOUNTER: MEDICARE and MEDICAID PATIENTS: I certify that this patient is under my care and that I had a face-to-face encounter that meets the physician face-to-face encounter requirements with this patient on this date. The encounter with the patient was in whole or in part for the following MEDICAL CONDITION: (primary reason for Home Healthcare) MEDICAL NECESSITY: I certify, that based on my findings, NURSING services are a medically necessary home health service. HOME BOUND STATUS: I certify that my clinical findings support that this patient is homebound (i.e., Due to illness or injury, pt requires aid of supportive devices such as crutches, cane, wheelchairs, walkers, the use of special transportation or the assistance of another person to leave their place of residence. There is a normal inability to leave the home and doing so requires considerable and taxing effort. Other absences are for medical reasons / religious services and are infrequent or of short duration when for other reasons). o If current dressing causes regression in wound condition, may D/C ordered dressing product/s and apply Normal Saline Moist Dressing daily until next Wound Healing Center / Other MD appointment. Notify Wound Healing Center of regression in wound condition at [phone]. o Please direct any NON-WOUND related issues/requests for orders to patient's Primary Care Physician Wound #5 Left,Distal Toe Great o Continue Home Health Visits - [facility] o Home Health Nurse may visit PRN to address patientos wound care needs. o FACE TO FACE ENCOUNTER: MEDICARE and MEDICAID PATIENTS: I certify that this patient is under my care and that I had a face-to-face encounter that meets the physician face-to-face encounter requirements with this patient on this date. The encounter with the patient was in whole or in part for the following MEDICAL CONDITION: (primary reason for Home  Healthcare) MEDICAL NECESSITY: I certify, that based on my findings, NURSING services are a medically necessary home health service. HOME BOUND STATUS: I certify that my clinical findings support that this patient is homebound (i.e., Due to illness or injury, pt requires aid of supportive devices such as crutches, cane, wheelchairs, walkers, the use of special transportation or the assistance of another person to leave their place of residence. There is a normal inability to leave the home and doing so requires considerable and taxing effort. Other absences are for medical reasons / religious services and are infrequent or of short duration when for other reasons). o If current dressing causes regression in wound condition, may D/C ordered dressing product/s and apply Normal Saline Moist  Dressing daily until next Wound Healing Center / Other MD appointment. Notify Wound Healing Center of regression in wound condition at [phone]. o Please direct any NON-WOUND related issues/requests for orders to patient's Primary Care Physician Wound #6 Left,Lateral Toe [name] Continue Home Health Visits - [facility] o Home Health Nurse may visit PRN to address patientos wound care needs. o FACE TO FACE ENCOUNTER: MEDICARE and MEDICAID PATIENTS: I certify that this patient is under my care and that I had a face-to-face encounter that meets the physician face-to-face encounter requirements with this patient on this date. The encounter with the patient was in whole or in part for the following MEDICAL CONDITION: (primary reason for Home Healthcare) MEDICAL NECESSITY: I certify, that based on my findings, NURSING services are a medically necessary home health service. HOME BOUND STATUS: I certify that my clinical findings support that this patient is homebound (i.e., Due to illness or injury, pt requires aid of supportive devices such as crutches, cane, wheelchairs, walkers, the use of special  transportation or the assistance of another person to leave their place of residence. There is a normal inability to leave the home and doing so requires considerable and taxing effort. Other absences are for medical reasons / religious services and are infrequent or of short duration when for other reasons). o If current dressing causes regression in wound condition, may D/C ordered dressing product/s and apply Normal Saline Moist Dressing daily until next Wound Healing Center / Other MD appointment. Notify Wound Healing Center of regression in wound condition at [phone]. o Please direct any NON-WOUND related issues/requests for orders to patient's Primary Care Physician Wound #7 Left Toe Third o Continue Home Health Visits - [facility] o Home Health Nurse may visit PRN to address patientos wound care needs. o FACE TO FACE ENCOUNTER: MEDICARE and MEDICAID PATIENTS: I certify that this patient is under my care and that I had a face-to-face encounter that meets the physician face-to-face encounter requirements with this patient on this date. The encounter with the patient was in whole or in part for the following MEDICAL CONDITION: (primary reason for Home Healthcare) MEDICAL NECESSITY: I certify, that based on my findings, [name], [name] ([id number]) NURSING services are a medically necessary home health service. HOME BOUND STATUS: I certify that my clinical findings support that this patient is homebound (i.e., Due to illness or injury, pt requires aid of supportive devices such as crutches, cane, wheelchairs, walkers, the use of special transportation or the assistance of another person to leave their place of residence. There is a normal inability to leave the home and doing so requires considerable and taxing effort. Other absences are for medical reasons / religious services and are infrequent or of short duration when for other reasons). o If current dressing causes  regression in wound condition, may D/C ordered dressing product/s and apply Normal Saline Moist Dressing daily until next Wound Healing Center / Other MD appointment. Notify Wound Healing Center of regression in wound condition at [phone]. o Please direct any NON-WOUND related issues/requests for orders to patient's Primary Care Physician Laboratory o Bacteria identified in Wound by Culture (MICRO) - Right Foot oooo LOINC Code: 6462-6 oooo Convenience Name: Wound culture routine Electronic Signature(s) Signed: 09/07/2017 5:38:59 PM By: Elliot Gurney, BSN, RN, CWS, Kim RN, BSN Signed: 09/07/2017 5:42:24 PM By: Baltazar Najjar MD Entered By: Elliot Gurney, BSN, RN, CWS, Kim on 09/07/2017 13:18:06 Gapinski, [name] ([id number]) -------------------------------------------------------------------------------- Problem List Details Patient Name: [name] Date of Service:  09/07/2017 12:30 PM Medical Record Number: [id number] Patient Account Number: [id number] Date of Birth/Sex: [birth year] (72 y.o. 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 Female) Treating RN: [name] Primary Care Provider: Quintin Alto Other Clinician: Referring Provider: Quintin Alto Treating Provider/Extender: Altamese [place] in Treatment: 12 Subjective History of Present Illness (HPI) The following HPI elements were documented for the patient's wound: Location: right foot toes Quality: Patient reports experiencing a sharp pain to affected area(s). Severity: Patient states wound are getting worse. Duration: Patient has had the wound for < 2 weeks prior to presenting for treatment Timing: Pain in wound is constant (hurts all the time) Context: The wound appeared gradually over time Modifying Factors: Other treatment(s) tried include:recent interventional process to place a stent in her right lower extremity for critical limb ischemia Associated Signs and Symptoms: Patient reports having foul odor. 72 year old female who was referred by Dr. Nanetta [name], who performed a endovascular procedure on 05/26/2017, for critical limb ischemia. Her angiogram showed a 99% below the knee popliteal stenosis which is stented with a 4 mm drug eluting stent. On 06/10/2017 he noted that her pulses are palpable and her Dopplers have normalized. He referred her to wound care for further advice and put her on Keflex 500 mg twice daily for 10 days. She has a past medical history of asthma, coronary artery disease, CHF, hyperlipidemia, ischemic cardiomyopathy, peripheral arterial disease and type 2 diabetes mellitus. She is status post recent abdominal aortogram with right lower extremity stenting, cholecystectomy, CABG, parathyroidectomy, thyroidectomy. She is a former smoker and quit in 1988. She had an ABI performed on 06/10/2017 with the right ABI within normal range and the left ABI shows mild  lower extremity arterial disease. The right ABI was 1.0 and biphasic and the left ABI was 0.93 and biphasic. Toe pressures were abnormal with the right being 0.27 and left being 0.31. the duplex study done showed a patent right popliteal artery stent without evidence of focal stenosis or obstruction. of note the patient started having signs of critical limb ischemia somewhere at the end of November and after an urgent workup was taken up for limb salvage with a successfully placed stent by Dr. Nanetta [name], with good arterial perfusion postprocedure with normal ABIs noted on 06/10/2017. 06/22/17; patient was admitted to our clinic last week by Dr. Meyer [name].she is a type II diabetic. She had undergone a right popliteal DES on 11/29 for critical limb ischemia including erythema and pain of her right forefoot. She states about 3 days later she started developing ulcerations in predominantly the right foot but also the left third toe. 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 This is never really healed  rib. Repeat ABIs on 12/14 showed a right ABI of 1 and a left ABI of 0.93 with toe pressures be markedly abnormal as noted above and Dr. Marcie BalBritto's notes. The patient is still having a lot of pain making it very difficult for her to function at home this is worse on the right greater than left foot she does describe severe lancinating pain. She does not ambulate all that much and she finds it intolerable to wear footwear Her history is also notable for A. fib, severe cardiomyopathy with an ejection fraction of 25%_30%. Last echocardiogram in June of this year. According to Epic she is on a combination of Eliquis plavix and aspirin. 06/29/17; patient has an appointment with Dr. Imogene Burnhen of vascular surgery I believe next week. This was arranged by her primary physician. She is still having a lot of pain in the right side in general most of her toes look better  however. Supposed to be using silver alginate however [facility] applying currently applied wet to dry dressings. 07/06/17; the patient went for appointment with Dr. Imogene Burnhen. She is status post right popliteal stenting with peroneal artery angioplasty with Dr. Gery PrayBarry. She presented to our clinic with bilateral lower extremity wounds in her toes which are exceptionally painful. Dr. Imogene Burnhen put her on a course of antibiotics. Her arterial studies from 06/10/17 showed an ABI in the right of 1 and ABI in the left of 0.8. Waveforms at the dorsalis pedis and posterior tibial were biphasic. However routine ABIs were [name], [name]. ([id number]) 0.31 bilaterally. The patient has severe bilateral rest pain. 07/20/17; the patient had her arteriogram by Dr. Imogene Burnhen on 07/14/17. Based on the images the patient was not felt to require any interventions and no further interventions were possible due to small size of the tibial arteries. The posterior tibial arteries bilaterally were occluded heel arteries were the dominant runoff which were miniscule. Anterior tibials both showed subtotal occlusion in the mid segment and these were small vessels. Paradoxically the patient complains of a lot less pain in the right foot however she is having a lot of pain in the left first and left third toes. She is managing this with activity limitation and narcotics. 07/27/17; I've spoken to Dr. Allyson SabalBerry today and he is going to do another arteriogram on the left leg early next week with possibility of retrograde arterial intervention. The patient is still having a lot of pain in the left third toe and left first toe which is worse when she puts her leg up i.e. lies in bed etc. 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 She has done better on the right which is the side where she had the DES stent placed in the right popliteal. She still has a wound on the medial aspect of the third toe and black eschar at the tip of the fourth and  fifth toes however most of the rest of the right foot looks better. 08/10/17; the patient was taken for angiography on 08/01/17; she had a successful left peroneal PTA and drug eluding stent now has a widely patent peroneal supply collateral to the distal posterior tibial that feeds the dorsal pedal arch. The patient is on antiplatelet therapy. Unfortunately she has not had much relief of pain in the left great and third toe. She has had an increase in her hydrocodone by her primary physician. She had follow-up noninvasive Doppler test done yesterday although I cannot pull up these results 08/17/17; patient returns to clinic.  She states she feels better. She's had an increase in her hydrocodone to 10/325 she is taking a half a tablet every 3 hours and occasionally a whole one at night which allows her to sleep through the night. She is not having pain in the right foot but is having pain in the left. She went to see Dr. Allyson Sabal yesterday. He noted that he performed perineal interventions on 2//19 with 2 overlapping drug-eluting stents for perineal CTO. Her left posterior tibial is occluded as was her left anterior tibial at the level of the ankle. Recent Doppler showed patent peroneal stent. He takes it me yesterday to report the patient was in less pain which she seems to verify although this may be because of an increase in strength and frequency of her narcotics culture the drainage I did last week showed both methicillin sensitive staph aureus and Pseudomonas. I sent in ciprofloxacin I believe for 10 days which should cover both of these. This may be partially why the pain in the right great toe is better 08/24/17; the patient is generally better. Especially the areas on the right foot where there are still some ischemic change on the right third and right fifth toes but her foot is warm here and I [name]'t think is contributing much to her pain. She still has considerable dry gangrene on the right great  toe with a open area medially. Also the left third toe. Things are still very tender here but considerably better. She doesn't have any purulent drainage or cellulitis and she is finished the antibiotics I gave her. She arrived in clinic with a low blood pressure she is having some chest pain 09/07/17; the patient continues to make decent improvement. She is in a lot less pain only using one half of a hydrocodone 10/325 3 times a day. There is really nothing open except for a small eschar on the tip of her right fourth toe. She still has thick eschar/dry gangrene on the left first and third toes although the pain here is a lot better as well. Objective Constitutional Patient is hypotensive.. Pulse regular and within target range for patient.. Temperature is normal and within the target range for the patient.Marland Kitchen appears in no distress. Vitals Time Taken: 12:48 PM, Height: 63 in, Weight: 176 lbs, BMI: 31.2, Temperature: 97.8 F, Pulse: 85 bpm, Respiratory Rate: 18 breaths/min, Blood Pressure: 96/55 mmHg. Eyes Conjunctivae clear. No discharge. Respiratory Respiratory effort is easy and symmetric bilaterally. Rate is normal at rest and on room air.Marland Kitchen [name], [name] Reece Agar ([id number]) Cardiovascular absent popliteal pulses.. Pedal pulses absent bilaterally.. Lymphatic none palpable in the popliteal or inguinal area. Musculoskeletal no issues with the left first interphalangeal joint or the left PIP and DIP #3. General Notes: wound exam; the left foot has dry gangrene at the tip of the first toe and the tip of the third toe. Everything else looks stable here. She is painting this with Betadine using silver alginate between the toes. I've cautioned her to make sure that the second toe isn't damaged by the surrounding eschar. The only thing she has on the right foot currently is a scant amount of dry eschar over the fourth toe. Integumentary (Hair, Skin) distal feet are cool.. Wound #1 status is Open.  Original cause of wound was Gradually Appeared. The wound is located on the Right,Distal Toe - Web between 2nd and 3rd. The wound measures 0.3cm length x 0.2cm width x 0.1cm depth; 0.047cm^2 area and 0.005cm^3 volume. Wound #2 status  is Open. Original cause of wound was Pressure Injury. The wound is located on the Right Toe - Web between 3rd and 4th. The wound measures 0.5cm length x 0.2cm width x 0.1cm depth; 0.079cm^2 area and 0.008cm^3 volume. Wound #4 status is Open. Original cause of wound was Gradually Appeared. The wound is located on the Right,Dorsal Toe Fifth. The wound measures 0.2cm length x 0.5cm width x 0.1cm depth; 0.079cm^2 area and 0.008cm^3 volume. Wound #5 status is Open. Original cause of wound was Gradually Appeared. The wound is located on the [employer]. The wound measures 4cm length x 2.8cm width x 0.1cm depth; 8.796cm^2 area and 0.88cm^3 volume. Wound #6 status is Open. Original cause of wound was Gradually Appeared. The wound is located on the [employer]. The wound measures 1.4cm length x 0.6cm width x 0.1cm depth; 0.66cm^2 area and 0.066cm^3 volume. Wound #7 status is Open. Original cause of wound was Gradually Appeared. The wound is located on the Left Toe Third. The wound measures 1.6cm length x 1.2cm width x 0.1cm depth; 1.508cm^2 area and 0.151cm^3 volume. 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 ([id number]) Wound Cleansing: Wound #1 Right,Distal Toe - Web between 2nd and 3rd: Clean wound with Normal Saline. May Shower, gently pat wound dry prior to applying new dressing. Wound #2 Right Toe - Web between 3rd and 4th: Clean wound with Normal  Saline. May Shower, gently pat wound dry prior to applying new dressing. Wound #4 Right,Dorsal Toe Fifth: Clean wound with Normal Saline. May Shower, gently pat wound dry prior to applying new dressing. Wound #5 Left,Distal Toe Great: Clean wound with Normal Saline. May Shower, gently pat wound dry prior to applying new dressing. Wound #6 Left,Lateral Toe Great: Clean wound with Normal Saline. May Shower, gently pat wound dry prior to applying new dressing. Wound #7 Left Toe Third: Clean wound with Normal Saline. May Shower, gently pat wound dry prior to applying new dressing. Anesthetic (add to Medication List): Wound #1 Right,Distal Toe - Web between 2nd and 3rd: Topical Lidocaine 4% cream applied to wound bed prior to debridement (In Clinic Only). Wound #2 Right Toe - Web between 3rd and 4th: Topical Lidocaine 4% cream applied to wound bed prior to debridement (In Clinic Only). Wound #4 Right,Dorsal Toe Fifth: Topical Lidocaine 4% cream applied to wound bed prior to debridement (In Clinic Only). Wound #5 Left,Distal Toe Great: Topical Lidocaine 4% cream applied to wound bed prior to debridement (In Clinic Only). Wound #6 Left,Lateral Toe Great: Topical Lidocaine 4% cream applied to wound bed prior to debridement (In Clinic Only). Wound #7 Left Toe Third: Topical Lidocaine 4% cream applied to wound bed prior to debridement (In Clinic Only). Primary Wound Dressing: Wound #1 Right,Distal Toe - Web between 2nd and 3rd: Other: - betadine, silvercell toes separated Wound #2 Right Toe - Web between 3rd and 4th: Other: - betadine, silvercell toes separated Wound #4 Right,Dorsal Toe Fifth: Other: - betadine, silvercell toes separated Wound #5 Left,Distal Toe Great: Other: - betadine, silvercell toes separated Wound #6 Left,Lateral Toe Great: Other: - betadine, silvercell toes separated Wound #7 Left Toe Third: Other: - betadine, silvercell toes separated Secondary Dressing: Wound  #1 Right,Distal Toe - Web between 2nd and  3rd: ABD and Kerlix/Conform Wound #2 Right Toe - Web between 3rd and 4th: ABD and Kerlix/Conform Wound #4 Right,Dorsal Toe Fifth: ABD and Kerlix/Conform Wound #5 Left,Distal Toe Great: ABD and Kerlix/Conform Wound #6 Left,Lateral Toe Great: ABD and Kerlix/Conform Wound #7 Left Toe Third: ABD and Kerlix/Conform Dressing Change Frequency: Mounce, Dasja [name]. ([id number]) Wound #1 Right,Distal Toe - Web between 2nd and 3rd: Change dressing every other day. Wound #2 Right Toe - Web between 3rd and 4th: Change dressing every other day. Wound #4 Right,Dorsal Toe Fifth: Change dressing every other day. Wound #5 Left,Distal Toe Great: Change dressing every other day. Wound #6 Left,Lateral Toe Great: Change dressing every other day. Wound #7 Left Toe Third: Change dressing every other day. Follow-up Appointments: Wound #1 Right,Distal Toe - Web between 2nd and 3rd: Return Appointment in 2 weeks. Wound #2 Right Toe - Web between 3rd and 4th: Return Appointment in 2 weeks. Wound #4 Right,Dorsal Toe Fifth: Return Appointment in 2 weeks. Wound #5 Left,Distal Toe Great: Return Appointment in 2 weeks. Wound #6 Left,Lateral Toe Great: Return Appointment in 2 weeks. Wound #7 Left Toe Third: Return Appointment in 2 weeks. Edema Control: Wound #1 Right,Distal Toe - Web between 2nd and 3rd: Elevate legs to the level of the heart and pump ankles as often as possible Wound #2 Right Toe - Web between 3rd and 4th: Elevate legs to the level of the heart and pump ankles as often as possible Wound #4 Right,Dorsal Toe Fifth: Elevate legs to the level of the heart and pump ankles as often as possible Wound #5 Left,Distal Toe Great: Elevate legs to the level of the heart and pump ankles as often as possible Wound #6 Left,Lateral Toe Great: Elevate legs to the level of the heart and pump ankles as often as possible Wound #7 Left Toe Third: Elevate legs to the  level of the heart and pump ankles as often as possible Off-Loading: Wound #1 Right,Distal Toe - Web between 2nd and 3rd: Open toe surgical shoe to: Wound #2 Right Toe - Web between 3rd and 4th: Open toe surgical shoe to: Wound #4 Right,Dorsal Toe Fifth: Open toe surgical shoe to: Wound #5 Left,Distal Toe Great: Open toe surgical shoe to: Wound #6 Left,Lateral Toe Great: Open toe surgical shoe to: Wound #7 Left Toe Third: Open toe surgical shoe to: Additional Orders / Instructions: Wound #1 Right,Distal Toe - Web between 2nd and 3rd: Increase protein intake. Wound #2 Right Toe - Web between 3rd and 4th: Increase protein intake. Wound #4 Right,Dorsal Toe Fifth: Increase protein intake. Wound #5 Left,Distal Toe Great: Increase protein intake. [name], [name] GMarland Kitchen ([id number]) Wound #6 Left,Lateral Toe Great: Increase protein intake. Wound #7 Left Toe Third: Increase protein intake. Home Health: Wound #1 Right,Distal Toe - Web between 2nd and 3rd: Continue Home Health Visits - [facility] Nurse may visit PRN to address patient s wound care needs. FACE TO FACE ENCOUNTER: MEDICARE and MEDICAID PATIENTS: I certify that this patient is under my care and that I had a face-to-face encounter that meets the physician face-to-face encounter requirements with this patient on this date. The encounter with the patient was in whole or in part for the following MEDICAL CONDITION: (primary reason for Home Healthcare) MEDICAL NECESSITY: I certify, that based on my findings, NURSING services are a medically necessary home health service. HOME BOUND STATUS: I certify that my clinical findings support that this patient is homebound (i.e., Due to illness or injury, pt requires aid of  supportive devices such as crutches, cane, wheelchairs, walkers, the use of special transportation or the assistance of another person to leave their place of residence. There is a normal inability to leave the home  and doing so requires considerable and taxing effort. Other absences are for medical reasons / religious services and are infrequent or of short duration when for other reasons). If current dressing causes regression in wound condition, may D/C ordered dressing product/s and apply Normal Saline Moist Dressing daily until next Wound Healing Center / Other MD appointment. Notify Wound Healing Center of regression in wound condition at [phone]. Please direct any NON-WOUND related issues/requests for orders to patient's Primary Care Physician Wound #2 Right Toe - Web between 3rd and 4th: Continue Home Health Visits - [facility] Nurse may visit PRN to address patient s wound care needs. FACE TO FACE ENCOUNTER: MEDICARE and MEDICAID PATIENTS: I certify that this patient is under my care and that I had a face-to-face encounter that meets the physician face-to-face encounter requirements with this patient on this date. The encounter with the patient was in whole or in part for the following MEDICAL CONDITION: (primary reason for Home Healthcare) MEDICAL NECESSITY: I certify, that based on my findings, NURSING services are a medically necessary home health service. HOME BOUND STATUS: I certify that my clinical findings support that this patient is homebound (i.e., Due to illness or injury, pt requires aid of supportive devices such as crutches, cane, wheelchairs, walkers, the use of special transportation or the assistance of another person to leave their place of residence. There is a normal inability to leave the home and doing so requires considerable and taxing effort. Other absences are for medical reasons / religious services and are infrequent or of short duration when for other reasons). If current dressing causes regression in wound condition, may D/C ordered dressing product/s and apply Normal Saline Moist Dressing daily until next Wound Healing Center / Other MD appointment.  Notify Wound Healing Center of regression in wound condition at [phone]. Please direct any NON-WOUND related issues/requests for orders to patient's Primary Care Physician Wound #4 Right,Dorsal Toe Fifth: Continue Home Health Visits - [facility] Nurse may visit PRN to address patient s wound care needs. FACE TO FACE ENCOUNTER: MEDICARE and MEDICAID PATIENTS: I certify that this patient is under my care and that I had a face-to-face encounter that meets the physician face-to-face encounter requirements with this patient on this date. The encounter with the patient was in whole or in part for the following MEDICAL CONDITION: (primary reason for Home Healthcare) MEDICAL NECESSITY: I certify, that based on my findings, NURSING services are a medically necessary home health service. HOME BOUND STATUS: I certify that my clinical findings support that this patient is homebound (i.e., Due to illness or injury, pt requires aid of supportive devices such as crutches, cane, wheelchairs, walkers, the use of special transportation or the assistance of another person to leave their place of residence. There is a normal inability to leave the home and doing so requires considerable and taxing effort. Other absences are for medical reasons / religious services and are infrequent or of short duration when for other reasons). If current dressing causes regression in wound condition, may D/C ordered dressing product/s and apply Normal Saline Moist Dressing daily until next Wound Healing Center / Other MD appointment. Notify Wound Healing Center of regression in wound condition at [phone]. Please direct any NON-WOUND related issues/requests for orders to patient's Primary  Care Physician Wound #5 Left,Distal Toe Great: Continue Home Health Visits - [facility] may visit PRN to address patient s wound care needs. FACE TO FACE ENCOUNTER: MEDICARE and MEDICAID PATIENTS: I certify  that this patient is under my care and that I had a face-to-face encounter that meets the physician face-to-face encounter requirements with this patient on this date. The encounter with the patient was in whole or in part for the following MEDICAL CONDITION: (primary reason for Home Healthcare) MEDICAL NECESSITY: I certify, that based on my findings, NURSING services are a medically necessary home SAVANNA, [name]. ([id number]) health service. HOME BOUND STATUS: I certify that my clinical findings support that this patient is homebound (i.e., Due to illness or injury, pt requires aid of supportive devices such as crutches, cane, wheelchairs, walkers, the use of special transportation or the assistance of another person to leave their place of residence. There is a normal inability to leave the home and doing so requires considerable and taxing effort. Other absences are for medical reasons / religious services and are infrequent or of short duration when for other reasons). If current dressing causes regression in wound condition, may D/C ordered dressing product/s and apply Normal Saline Moist Dressing daily until next Wound Healing Center / Other MD appointment. Notify Wound Healing Center of regression in wound condition at [phone]. Please direct any NON-WOUND related issues/requests for orders to patient's Primary Care Physician Wound #6 Left,Lateral Toe Great: Continue Home Health Visits - [facility] Nurse may visit [facility]N to address patient s wound care needs. FACE TO FACE ENCOUNTER: MEDICARE and MEDICAID PATIENTS: I certify that this patient is under my care and that I had a face-to-face encounter that meets the physician face-to-face encounter requirements with this patient on this date. The encounter with the patient was in whole or in part for the following MEDICAL CONDITION: (primary reason for Home Healthcare) MEDICAL NECESSITY: I certify, that based on my findings, NURSING  services are a medically necessary home health service. HOME BOUND STATUS: I certify that my clinical findings support that this patient is homebound (i.e., Due to illness or injury, pt requires aid of supportive devices such as crutches, cane, wheelchairs, walkers, the use of special transportation or the assistance of another person to leave their place of residence. There is a normal inability to leave the home and doing so requires considerable and taxing effort. Other absences are for medical reasons / religious services and are infrequent or of short duration when for other reasons). If current dressing causes regression in wound condition, may D/C ordered dressing product/s and apply Normal Saline Moist Dressing daily until next Wound Healing Center / Other MD appointment. Notify Wound Healing Center of regression in wound condition at [phone]. Please direct any NON-WOUND related issues/requests for orders to patient's Primary Care Physician Wound #7 Left Toe Third: Continue Home Health Visits - [facility] Nurse may visit [facility] to address patient s wound care needs. FACE TO FACE ENCOUNTER: MEDICARE and MEDICAID PATIENTS: I certify that this patient is under my care and that I had a face-to-face encounter that meets the physician face-to-face encounter requirements with this patient on this date. The encounter with the patient was in whole or in part for the following MEDICAL CONDITION: (primary reason for Home Healthcare) MEDICAL NECESSITY: I certify, that based on my findings, NURSING services are a medically necessary home health service. HOME BOUND STATUS: I certify that my clinical findings support that this  patient is homebound (i.e., Due to illness or injury, pt requires aid of supportive devices such as crutches, cane, wheelchairs, walkers, the use of special transportation or the assistance of another person to leave their place of residence. There is a normal inability  to leave the home and doing so requires considerable and taxing effort. Other absences are for medical reasons / religious services and are infrequent or of short duration when for other reasons). If current dressing causes regression in wound condition, may D/C ordered dressing product/s and apply Normal Saline Moist Dressing daily until next Wound Healing Center / Other MD appointment. Notify Wound Healing Center of regression in wound condition at [phone]. Please direct any NON-WOUND related issues/requests for orders to patient's Primary Care Physician Laboratory ordered were: Wound culture routine - Right Foot #1 where continuing with Betadine and silver alginate on the left #2 she hardly has anything open on the right foot. #3 her pain is a lot better and easier to control with less hydrocodone. #4 follow-up in 2 weeks #5 she follows up with Dr. Allyson Sabal next week [facility] ([id number]) Electronic Signature(s) Signed: 09/07/2017 5:42:24 PM By: Baltazar Najjar MD Entered By: Baltazar Najjar on 09/07/2017 16:04:28 Blatz, [name] ([id number]) -------------------------------------------------------------------------------- SuperBill Details Patient Name: [name] Date of Service: 09/07/2017 Medical Record Number: [id number] Patient Account Number: [id number] Date of Birth/Sex: [birth year] (72 y.o. 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 Atherosclerosis of nonbiological bypass graft(s) of the extremities with gangrene, right leg Facility Procedures CPT4 Code: 29528413 Description:  99214 - WOUND CARE VISIT-LEV 4 EST PT Modifier: Quantity: 1 Physician Procedures CPT4 Code Description: 2440102 99213 - WC PHYS LEVEL 3 - EST PT ICD-10 Diagnosis Description 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

## 2017-09-09 NOTE — Progress Notes (Signed)
LATORRIA, ZEOLI ([id number]) Visit Report for 09/07/2017 Arrival Information Details Patient Name: [name], [name] Date of Service: 09/07/2017 12:30 PM Medical Record Number: [id number] Patient Account Number: [id number] Date of Birth/Sex: 07-Jun-1946 (72 y.o. Female) Treating RN: Curtis Sites Primary Care Macyn Remmert: Quintin Alto Other Clinician: Referring Darrian Grzelak: Quintin Alto Treating Yoko Mcgahee/Extender: Altamese [place] in Treatment: 12 Visit Information History Since Last Visit Added or deleted any medications: No Patient Arrived: Walker Any new allergies or adverse reactions: No Arrival Time: 12:47 Had a fall or experienced change in No Accompanied By: son activities of daily living that may affect Transfer Assistance: None risk of falls: Patient Identification Verified: Yes Signs or symptoms of abuse/neglect since last visito No Secondary Verification Process Yes Hospitalized since last visit: No Completed: Has Dressing in Place as Prescribed: Yes Patient Requires Transmission-Based No Pain Present Now: No Precautions: Patient Has Alerts: Yes Patient Alerts: Patient on Blood Thinner Prediabetic Eliquis Electronic Signature(s) Signed: 09/07/2017 5:19:26 PM By: Curtis Sites Entered By: Curtis Sites on 09/07/2017 12:47:22 Pestka, [name] ([id number]) -------------------------------------------------------------------------------- Clinic Level of Care Assessment Details Patient Name: [name] Date of Service: 09/07/2017 12:30 PM Medical Record Number: [id number] Patient Account Number: [id number] Date of Birth/Sex: Jul 15, 1945 (72 y.o. Female) Treating RN: Huel Coventry Primary Care Elpidio Thielen: Quintin Alto Other Clinician: Referring Faren Florence: Quintin Alto Treating Cathline Dowen/Extender: Altamese [place] in Treatment: 12 Clinic Level of Care Assessment Items TOOL 4 Quantity Score []  - Use when only an EandM is performed on FOLLOW-UP visit  0 ASSESSMENTS - Nursing Assessment / Reassessment []  - Reassessment of Co-morbidities (includes updates in patient status) 0 X- 1 5 Reassessment of Adherence to Treatment Plan ASSESSMENTS - Wound and Skin Assessment / Reassessment X - Simple Wound Assessment / Reassessment - one wound 1 5 []  - 0 Complex Wound Assessment / Reassessment - multiple wounds []  - 0 Dermatologic / Skin Assessment (not related to wound area) ASSESSMENTS - Focused Assessment []  - Circumferential Edema Measurements - multi extremities 0 []  - 0 Nutritional Assessment / Counseling / Intervention []  - 0 Lower Extremity Assessment (monofilament, tuning fork, pulses) []  - 0 Peripheral Arterial Disease Assessment (using hand held doppler) ASSESSMENTS - Ostomy and/or Continence Assessment and Care []  - Incontinence Assessment and Management 0 []  - 0 Ostomy Care Assessment and Management (repouching, etc.) PROCESS - Coordination of Care X - Simple Patient / Family Education for ongoing care 1 15 []  - 0 Complex (extensive) Patient / Family Education for ongoing care X- 1 10 Staff obtains Chiropractor, Records, Test Results / Process Orders []  - 0 Staff telephones HHA, Nursing Homes / Clarify orders / etc []  - 0 Routine Transfer to another Facility (non-emergent condition) []  - 0 Routine Hospital Admission (non-emergent condition) []  - 0 New Admissions / Manufacturing engineer / Ordering NPWT, Apligraf, etc. []  - 0 Emergency Hospital Admission (emergent condition) X- 1 10 Simple Discharge Coordination Creveling, Yarithza G. ([id number]) []  - 0 Complex (extensive) Discharge Coordination PROCESS - Special Needs []  - Pediatric / Minor Patient Management 0 []  - 0 Isolation Patient Management []  - 0 Hearing / Language / Visual special needs []  - 0 Assessment of Community assistance (transportation, D/C planning, etc.) []  - 0 Additional assistance / Altered mentation []  - 0 Support Surface(s) Assessment (bed,  cushion, seat, etc.) INTERVENTIONS - Wound Cleansing / Measurement []  - Simple Wound Cleansing - one wound 0 X- 6 5 Complex Wound Cleansing - multiple wounds X- 1 5 Wound Imaging (photographs - any number  of wounds) []  - 0 Wound Tracing (instead of photographs) []  - 0 Simple Wound Measurement - one wound X- 6 5 Complex Wound Measurement - multiple wounds INTERVENTIONS - Wound Dressings []  - Small Wound Dressing one or multiple wounds 0 X- 2 15 Medium Wound Dressing one or multiple wounds []  - 0 Large Wound Dressing one or multiple wounds []  - 0 Application of Medications - topical []  - 0 Application of Medications - injection INTERVENTIONS - Miscellaneous []  - External ear exam 0 X- 1 5 Specimen Collection (cultures, biopsies, blood, body fluids, etc.) X- 1 5 Specimen(s) / Culture(s) sent or taken to Lab for analysis []  - 0 Patient Transfer (multiple staff / Nurse, adult / Similar devices) []  - 0 Simple Staple / Suture removal (25 or less) []  - 0 Complex Staple / Suture removal (26 or more) []  - 0 Hypo / Hyperglycemic Management (close monitor of Blood Glucose) []  - 0 Ankle / Brachial Index (ABI) - do not check if billed separately X- 1 5 Vital Signs Sao, Ameriah G. ([id number]) Has the patient been seen at the hospital within the last three years: Yes Total Score: 155 Level Of Care: New/Established - Level 4 Electronic Signature(s) Signed: 09/07/2017 5:38:59 PM By: Elliot Gurney, BSN, RN, CWS, Kim RN, BSN Entered By: Elliot Gurney, BSN, RN, CWS, Kim on 09/07/2017 13:16:50 Gugliotta, [name] ([id number]) -------------------------------------------------------------------------------- Encounter Discharge Information Details Patient Name: [name] Date of Service: 09/07/2017 12:30 PM Medical Record Number: [id number] Patient Account Number: [id number] Date of Birth/Sex: 1946/06/26 (72 y.o. Female) Treating RN: Curtis Sites Primary Care Angeline Trick: Quintin Alto Other  Clinician: Referring Niyana Chesbro: Quintin Alto Treating Woodson Macha/Extender: Altamese [place] in Treatment: 12 Encounter Discharge Information Items Discharge Pain Level: 0 Discharge Condition: Stable Ambulatory Status: Walker Discharge Destination: Home Private Transportation: Auto Accompanied By: spouse Schedule Follow-up Appointment: Yes Medication Reconciliation completed and provided No to Patient/Care Tristen Luce: Clinical Summary of Care: Electronic Signature(s) Signed: 09/07/2017 5:19:26 PM By: Curtis Sites Entered By: Curtis Sites on 09/07/2017 13:31:37 Blankenhorn, [name] ([id number]) -------------------------------------------------------------------------------- Lower Extremity Assessment Details Patient Name: [name] Date of Service: 09/07/2017 12:30 PM Medical Record Number: [id number] Patient Account Number: [id number] Date of Birth/Sex: 03-12-1946 (72 y.o. Female) Treating RN: Curtis Sites Primary Care Zakiyah Diop: Quintin Alto Other Clinician: Referring Reace Breshears: Quintin Alto Treating Emrah Ariola/Extender: Altamese [place] in Treatment: 12 Vascular Assessment Pulses: Dorsalis Pedis Palpable: [Left:Yes] [Right:Yes] Posterior Tibial Extremity colors, hair growth, and conditions: Extremity Color: [Left:Normal] [Right:Normal] Hair Growth on Extremity: [Left:No] [Right:No] Temperature of Extremity: [Left:Warm] [Right:Warm] Capillary Refill: [Left:< 3 seconds] [Right:< 3 seconds] Electronic Signature(s) Signed: 09/07/2017 5:19:26 PM By: Curtis Sites Entered By: Curtis Sites on 09/07/2017 13:00:41 Mirkin, [name] ([id number]) -------------------------------------------------------------------------------- Multi Wound Chart Details Patient Name: [name] Date of Service: 09/07/2017 12:30 PM Medical Record Number: [id number] Patient Account Number: [id number] Date of Birth/Sex: 07-19-1945 (72 y.o. 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 Date Acquired: 05/30/2017 02/21/2017 07/19/2017 Weeks of Treatment: 12 11 7  Wound Status: Open Open Open  Clustered Wound: Yes No No Pending Amputation on Yes No No Presentation: Measurements L x W x D 0.3x0.2x0.1 0.5x0.2x0.1 0.2x0.5x0.1 (cm) Area (cm) : 0.047 0.079 0.079 Volume (cm) : 0.005 0.008 0.008 % Reduction in Area: 99.80% 94.80% -154.80% % Reduction in Volume: 99.80% 94.70% -166.70% Classification: Full Thickness Without Partial Thickness Partial Thickness Exposed Support Structures Periwound Skin Texture: No Abnormalities Noted No Abnormalities Noted No Abnormalities Noted Periwound Skin Moisture: No Abnormalities Noted No Abnormalities Noted No Abnormalities Noted Periwound Skin Color: No Abnormalities Noted No Abnormalities Noted No Abnormalities Noted Tenderness on Palpation: No No No Wound Number: 5 6 7  Photos: Wound Location: Left, Distal Toe Great Left, Lateral Toe Great Left Toe Third Kang, Charlea G. 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 Date Acquired: 07/17/2017 07/17/2017 07/16/2017 Weeks of Treatment: 7 7 7  Wound Status: Open Open Open Clustered Wound: No No No Pending  Amputation on No No No Presentation: Measurements L x W x D 4x2.8x0.1 1.4x0.6x0.1 1.6x1.2x0.1 (cm) Area (cm) : 8.796 0.66 1.508 Volume (cm) : 0.88 0.066 0.151 % Reduction in Area: -459.90% 78.80% -100.00% % Reduction in Volume: -5400.00% 78.80% -101.30% Classification: Partial Thickness Partial Thickness Partial Thickness Periwound Skin Texture: No Abnormalities Noted No Abnormalities Noted No Abnormalities Noted Periwound Skin Moisture: No Abnormalities Noted No Abnormalities Noted No Abnormalities Noted Periwound Skin Color: No Abnormalities Noted No Abnormalities Noted No Abnormalities Noted Tenderness on Palpation: No No No Treatment Notes Wound #1 (Right, Distal Toe - Web between 2nd and 3rd) 1. Cleansed with: Clean wound with Normal Saline 2. Anesthetic Topical Lidocaine 4% cream to wound bed prior to debridement 4. Dressing Applied: Other dressing (specify in notes) 5. Secondary Dressing Applied ABD Pad Notes betadine on wounds, silvercell, kerlix wrap Wound #2 (Right Toe - Web between 3rd and 4th) 1. Cleansed with: Clean wound with Normal Saline 2. Anesthetic Topical Lidocaine 4% cream to wound bed prior to debridement 4. Dressing Applied: Other dressing (specify in notes) 5. Secondary Dressing Applied ABD Pad Notes betadine on wounds, silvercell, kerlix wrap Wound #4 (Right, Dorsal Toe Fifth) 1. Cleansed with: Clean wound with Normal Saline 2. Anesthetic Topical Lidocaine 4% cream to wound bed prior to debridement 4. Dressing Applied: Other dressing (specify in notes) 5. Secondary Dressing Applied Facchini, Zenora G. ([id number]) ABD Pad Notes betadine on wounds, silvercell, kerlix wrap Wound #5 (Left, Distal Toe Great) 1. Cleansed with: Clean wound with Normal Saline 2. Anesthetic Topical Lidocaine 4% cream to wound bed prior to debridement 4. Dressing Applied: Other dressing (specify in notes) 5. Secondary Dressing Applied ABD Pad Notes betadine on  wounds, silvercell, kerlix wrap Wound #6 (Left, Lateral Toe Great) 1. Cleansed with: Clean wound with Normal Saline 2. Anesthetic Topical Lidocaine 4% cream to wound bed prior to debridement 4. Dressing Applied: Other dressing (specify in notes) 5. Secondary Dressing Applied ABD Pad Notes betadine on wounds, silvercell, kerlix wrap Wound #7 (Left Toe Third) 1. Cleansed with: Clean wound with Normal Saline 2. Anesthetic Topical Lidocaine 4% cream to wound bed prior to debridement 4. Dressing Applied: Other dressing (specify in notes) 5. Secondary Dressing Applied ABD Pad Notes betadine on wounds, silvercell, kerlix wrap Electronic Signature(s) Signed: 09/07/2017 5:42:24 PM By: Baltazar Najjar MD Entered By: Baltazar Najjar on 09/07/2017 15:53:34 Strausser, [name] ([id number]) -------------------------------------------------------------------------------- Multi-Disciplinary Care Plan Details Patient Name: [name] Date of Service: 09/07/2017 12:30 PM Medical Record Number: [id number] Patient Account Number: [id number] Date  of Birth/Sex: 1945/08/06 (72 y.o. Female) Treating RN: Huel Coventry Primary Care Joselyne Spake: Quintin Alto Other Clinician: Referring Flint Hakeem: Quintin Alto Treating Loxley Cibrian/Extender: Altamese [place] in Treatment: 12 Active Inactive ` Abuse / Safety / Falls / Self Care Management Nursing Diagnoses: History of Falls Impaired home maintenance Potential for falls Goals: Patient will not experience any injury related to falls Date Initiated: 06/13/2017 Target Resolution Date: 07/14/2017 Goal Status: Active Patient will remain injury free related to falls Date Initiated: 06/13/2017 Target Resolution Date: 07/14/2017 Goal Status: Active Interventions: Assess fall risk on admission and as needed Treatment Activities: Patient referred to home care : 06/13/2017 Notes: ` Necrotic Tissue Nursing Diagnoses: Impaired tissue integrity  related to necrotic/devitalized tissue Goals: Necrotic/devitalized tissue will be minimized in the wound bed Date Initiated: 06/13/2017 Target Resolution Date: 07/14/2017 Goal Status: Active Interventions: Assess patient pain level pre-, during and post procedure and prior to discharge Provide education on necrotic tissue and debridement process Treatment Activities: Apply topical anesthetic as ordered : 06/13/2017 Notes: Buskirk, [name] ([id number]) ` Orientation to the Wound Care Program Nursing Diagnoses: Knowledge deficit related to the wound healing center program Goals: Patient/caregiver will verbalize understanding of the Wound Healing Center Program Date Initiated: 06/13/2017 Target Resolution Date: 07/14/2017 Goal Status: Active Interventions: Provide education on orientation to the wound center Notes: ` Wound/Skin Impairment Nursing Diagnoses: Impaired tissue integrity Knowledge deficit related to ulceration/compromised skin integrity Goals: Ulcer/skin breakdown will have a volume reduction of 30% by week 4 Date Initiated: 06/13/2017 Target Resolution Date: 07/14/2017 Goal Status: Active Interventions: Assess ulceration(s) every visit Treatment Activities: Patient referred to home care : 06/13/2017 Topical wound management initiated : 06/13/2017 Notes: Electronic Signature(s) Signed: 09/07/2017 5:38:59 PM By: Elliot Gurney, BSN, RN, CWS, Kim RN, BSN Entered By: Elliot Gurney, BSN, RN, CWS, Kim on 09/07/2017 13:05:23 Lady, [name] ([id number]) -------------------------------------------------------------------------------- Pain Assessment Details Patient Name: [name] Date of Service: 09/07/2017 12:30 PM Medical Record Number: [id number] Patient Account Number: [id number] Date of Birth/Sex: [birth year] (72 y.o. Female) Treating RN: Curtis Sites Primary Care Tinya Cadogan: Quintin Alto Other Clinician: Referring Bera Pinela: Quintin Alto Treating Lorana Maffeo/Extender:  Altamese [place] in Treatment: 12 Active Problems Location of Pain Severity and Description of Pain Patient Has Paino No Site Locations With Dressing Change: Yes Duration of the Pain. Constant / Intermittento Constant Pain Management and Medication Current Pain Management: Notes Topical or injectable lidocaine is offered to patient for acute pain when surgical debridement is performed. If needed, Patient is instructed to use over the counter pain medication for the following 24-48 hours after debridement. Wound care MDs do not prescribed pain medications. Patient has chronic pain or uncontrolled pain. Patient has been instructed to make an appointment with their Primary Care Physician for pain management. Electronic Signature(s) Signed: 09/07/2017 5:19:26 PM By: Curtis Sites Entered By: Curtis Sites on 09/07/2017 12:47:58 Arns, [name] ([id number]) -------------------------------------------------------------------------------- Patient/Caregiver Education Details Patient Name: [name] Date of Service: 09/07/2017 12:30 PM Medical Record Number: [id number] Patient Account Number: [id number] Date of Birth/Gender: 11-May-1946 (72 y.o. Female) Treating RN: Curtis Sites Primary Care Physician: Quintin Alto Other Clinician: Referring Physician: Quintin Alto Treating Physician/Extender: Altamese  in Treatment: 12 Education Assessment Education Provided To: Patient and Caregiver Education Topics Provided Wound/Skin Impairment: Handouts: Other: wound care as ordered Methods: Demonstration, Explain/Verbal Responses: State content correctly Electronic Signature(s) Signed: 09/07/2017 5:19:26 PM By: Curtis Sites Entered By: Curtis Sites on 09/07/2017 13:31:57 Newlon, [name] ([id number]) -------------------------------------------------------------------------------- Wound Assessment Details Patient Name: Tia Masker  G. Date of Service:  09/07/2017 12:30 PM Medical Record Number: [id number] Patient Account Number: [id number] Date of Birth/Sex: [birth year] (72 y.o. Female) Treating RN: Curtis Sites Primary Care Ashling Roane: Quintin Alto Other Clinician: Referring Charvi Gammage: Quintin Alto Treating Walaa Carel/Extender: Altamese [place] in Treatment: 12 Wound Status Wound Number: 1 Primary Etiology: Arterial Insufficiency Ulcer Wound Location: Right, Distal Toe - Web between 2nd Wound Status: Open and 3rd Wounding Event: Gradually Appeared Date Acquired: 05/30/2017 Weeks Of Treatment: 12 Clustered Wound: Yes Pending Amputation On Presentation Photos Photo Uploaded By: Curtis Sites on 09/07/2017 13:08:21 Wound Measurements Length: (cm) 0.3 Width: (cm) 0.2 Depth: (cm) 0.1 Area: (cm) 0.047 Volume: (cm) 0.005 % Reduction in Area: 99.8% % Reduction in Volume: 99.8% Wound Description Full Thickness Without Exposed Support Classification: Structures Periwound Skin Texture Texture Color No Abnormalities Noted: No No Abnormalities Noted: No Moisture No Abnormalities Noted: No Treatment Notes Wound #1 (Right, Distal Toe - Web between 2nd and 3rd) 1. Cleansed with: Clean wound with Normal Saline 2. Anesthetic Topical Lidocaine 4% cream to wound bed prior to debridement 4. Dressing Applied: DONTAVIA, BRAND ([id number]) Other dressing (specify in notes) 5. Secondary Dressing Applied ABD Pad Notes betadine on wounds, silvercell, kerlix wrap Electronic Signature(s) Signed: 09/07/2017 5:19:26 PM By: Curtis Sites Entered By: Curtis Sites on 09/07/2017 12:59:01 Liberto, [name] ([id number]) -------------------------------------------------------------------------------- Wound Assessment Details Patient Name: [name] Date of Service: 09/07/2017 12:30 PM Medical Record Number: [id number] Patient Account Number: [id number] Date of Birth/Sex: 08-02-1945 (72 y.o. Female) Treating RN: Curtis Sites Primary Care Falana Clagg: Quintin Alto Other Clinician: Referring Brendalee Matthies: Quintin Alto Treating Kerrianne Jeng/Extender: Altamese [place] in Treatment: 12 Wound Status Wound Number: 2 Primary Etiology: Arterial Insufficiency Ulcer Wound Location: Right Toe - Web between 3rd and 4th Wound Status: Open Wounding Event: Pressure Injury Date Acquired: 02/21/2017 Weeks Of Treatment: 11 Clustered Wound: No Photos Photo Uploaded By: Curtis Sites on 09/07/2017 13:08:21 Wound Measurements Length: (cm) 0.5 Width: (cm) 0.2 Depth: (cm) 0.1 Area: (cm) 0.079 Volume: (cm) 0.008 % Reduction in Area: 94.8% % Reduction in Volume: 94.7% Wound Description Classification: Partial Thickness Periwound Skin Texture Texture Color No Abnormalities Noted: No No Abnormalities Noted: No Moisture No Abnormalities Noted: No Treatment Notes Wound #2 (Right Toe - Web between 3rd and 4th) 1. Cleansed with: Clean wound with Normal Saline 2. Anesthetic Topical Lidocaine 4% cream to wound bed prior to debridement 4. Dressing Applied: Other dressing (specify in notes) 5. Secondary Dressing Applied ABD Pad Conley, Maisey G. ([id number]) Notes betadine on wounds, silvercell, kerlix wrap Electronic Signature(s) Signed: 09/07/2017 5:19:26 PM By: Curtis Sites Entered By: Curtis Sites on 09/07/2017 12:59:02 Niblack, [name] ([id number]) -------------------------------------------------------------------------------- Wound Assessment Details Patient Name: [name] Date of Service: 09/07/2017 12:30 PM Medical Record Number: [id number] Patient Account Number: [id number] Date of Birth/Sex: 10-Feb-1946 (72 y.o. Female) Treating RN: Curtis Sites Primary Care Kaydn Kumpf: Quintin Alto Other Clinician: Referring Jerzi Tigert: Quintin Alto Treating Mikael Debell/Extender: Altamese [place] in Treatment: 12 Wound Status Wound Number: 4 Primary Etiology: Arterial Insufficiency  Ulcer Wound Location: Right, Dorsal Toe Fifth Wound Status: Open Wounding Event: Gradually Appeared Date Acquired: 07/19/2017 Weeks Of Treatment: 7 Clustered Wound: No Photos Photo Uploaded By: Curtis Sites on 09/07/2017 13:08:40 Wound Measurements Length: (cm) 0.2 Width: (cm) 0.5 Depth: (cm) 0.1 Area: (cm) 0.079 Volume: (cm) 0.008 % Reduction in Area: -154.8% % Reduction in Volume: -166.7% Wound Description Classification: Partial Thickness Periwound Skin Texture Texture Color No Abnormalities Noted: No No Abnormalities Noted: No  Moisture No Abnormalities Noted: No Treatment Notes Wound #4 (Right, Dorsal Toe Fifth) 1. Cleansed with: Clean wound with Normal Saline 2. Anesthetic Topical Lidocaine 4% cream to wound bed prior to debridement 4. Dressing Applied: Other dressing (specify in notes) 5. Secondary Dressing Applied ABD Pad Lothamer, Kalisi G. ([id number]) Notes betadine on wounds, silvercell, kerlix wrap Electronic Signature(s) Signed: 09/07/2017 5:19:26 PM By: Curtis Sitesorthy, Joanna Entered By: Curtis Sitesorthy, Joanna on 09/07/2017 12:59:02 Lince, Don PerkingMARY G. ([id number]) -------------------------------------------------------------------------------- Wound Assessment Details Patient Name: [name] LoaEFALCO, Zakira G. Date of Service: 09/07/2017 12:30 PM Medical Record Number: [id number] Patient Account Number: [id number] Date of Birth/Sex: 07-18-1945 [age] y.o. Female) Treating RN: Curtis Sitesorthy, Joanna Primary Care Karston Hyland: Quintin AltoBURDINE, STEVEN Other Clinician: Referring Alec Jaros: Quintin AltoBURDINE, STEVEN Treating Sincere Berlanga/Extender: Altamese CarolinaOBSON, MICHAEL G Weeks in Treatment: 12 Wound Status Wound Number: 5 Primary Etiology: Arterial Insufficiency Ulcer Wound Location: Left, Distal Toe Great Wound Status: Open Wounding Event: Gradually Appeared Date Acquired: 07/17/2017 Weeks Of Treatment: 7 Clustered Wound: No Photos Photo Uploaded By: Curtis Sitesorthy, Joanna on 09/07/2017 13:08:40 Wound Measurements Length: (cm)  4 Width: (cm) 2.8 Depth: (cm) 0.1 Area: (cm) 8.796 Volume: (cm) 0.88 % Reduction in Area: -459.9% % Reduction in Volume: -5400% Wound Description Classification: Partial Thickness Periwound Skin Texture Texture Color No Abnormalities Noted: No No Abnormalities Noted: No Moisture No Abnormalities Noted: No Treatment Notes Wound #5 (Left, Distal Toe Great) 1. Cleansed with: Clean wound with Normal Saline 2. Anesthetic Topical Lidocaine 4% cream to wound bed prior to debridement 4. Dressing Applied: Other dressing (specify in notes) 5. Secondary Dressing Applied ABD Pad Arntson, Nida G. ([id number]) Notes betadine on wounds, silvercell, kerlix wrap Electronic Signature(s) Signed: 09/07/2017 5:19:26 PM By: Curtis Sitesorthy, Joanna Entered By: Curtis Sitesorthy, Joanna on 09/07/2017 12:59:02 [place]rogham, Don PerkingMARY G. ([id number]) -------------------------------------------------------------------------------- Wound Assessment Details Patient Name: [name] LoaEFALCO, Jaylena G. Date of Service: 09/07/2017 12:30 PM Medical Record Number: [id number] Patient Account Number: [id number] Date of Birth/Sex: 07-18-1945 [age] y.o. Female) Treating RN: Curtis Sitesorthy, Joanna Primary Care Montzerrat Brunell: Quintin AltoBURDINE, STEVEN Other Clinician: Referring Davante Gerke: Quintin AltoBURDINE, STEVEN Treating Johnchristopher Sarvis/Extender: Altamese CarolinaOBSON, MICHAEL G Weeks in Treatment: 12 Wound Status Wound Number: 6 Primary Etiology: Arterial Insufficiency Ulcer Wound Location: Left, Lateral Toe Great Wound Status: Open Wounding Event: Gradually Appeared Date Acquired: 07/17/2017 Weeks Of Treatment: 7 Clustered Wound: No Photos Photo Uploaded By: Curtis Sitesorthy, Joanna on 09/07/2017 13:08:59 Wound Measurements Length: (cm) 1.4 Width: (cm) 0.6 Depth: (cm) 0.1 Area: (cm) 0.66 Volume: (cm) 0.066 % Reduction in Area: 78.8% % Reduction in Volume: 78.8% Wound Description Classification: Partial Thickness Periwound Skin Texture Texture Color No Abnormalities Noted: No No Abnormalities  Noted: No Moisture No Abnormalities Noted: No Treatment Notes Wound #6 (Left, Lateral Toe Great) 1. Cleansed with: Clean wound with Normal Saline 2. Anesthetic Topical Lidocaine 4% cream to wound bed prior to debridement 4. Dressing Applied: Other dressing (specify in notes) 5. Secondary Dressing Applied ABD Pad Mcgregor, Sylvanna G. ([id number]) Notes betadine on wounds, silvercell, kerlix wrap Electronic Signature(s) Signed: 09/07/2017 5:19:26 PM By: Curtis Sitesorthy, Joanna Entered By: Curtis Sitesorthy, Joanna on 09/07/2017 12:59:02 Calbert, Don PerkingMARY G. ([id number]) -------------------------------------------------------------------------------- Wound Assessment Details Patient Name: [name] LoaEFALCO, Andrya G. Date of Service: 09/07/2017 12:30 PM Medical Record Number: [id number] Patient Account Number: [id number] Date of Birth/Sex: 07-18-1945 [age] y.o. Female) Treating RN: Curtis Sitesorthy, Joanna Primary Care Lenox Bink: Quintin AltoBURDINE, STEVEN Other Clinician: Referring Zi Newbury: Quintin AltoBURDINE, STEVEN Treating Jaksen Fiorella/Extender: Maxwell CaulOBSON, MICHAEL G Weeks in Treatment: 12 Wound Status Wound Number: 7 Primary Etiology: Arterial Insufficiency Ulcer Wound Location: Left Toe Third Wound Status: Open Wounding Event: Gradually Appeared Date Acquired: 07/16/2017 Weeks Of Treatment:  7 Clustered Wound: No Photos Photo Uploaded By: Curtis Sites on 09/07/2017 13:08:59 Wound Measurements Length: (cm) 1.6 Width: (cm) 1.2 Depth: (cm) 0.1 Area: (cm) 1.508 Volume: (cm) 0.151 % Reduction in Area: -100% % Reduction in Volume: -101.3% Wound Description Classification: Partial Thickness Periwound Skin Texture Texture Color No Abnormalities Noted: No No Abnormalities Noted: No Moisture No Abnormalities Noted: No Treatment Notes Wound #7 (Left Toe Third) 1. Cleansed with: Clean wound with Normal Saline 2. Anesthetic Topical Lidocaine 4% cream to wound bed prior to debridement 4. Dressing Applied: Other dressing (specify in notes) 5.  Secondary Dressing Applied ABD Pad Mcdougall, Anamae G. ([id number]) Notes betadine on wounds, silvercell, kerlix wrap Electronic Signature(s) Signed: 09/07/2017 5:19:26 PM By: Curtis Sites Entered By: Curtis Sites on 09/07/2017 12:59:02 Eng, [name] ([id number]) -------------------------------------------------------------------------------- Vitals Details Patient Name: [name] Date of Service: 09/07/2017 12:30 PM Medical Record Number: [id number] Patient Account Number: [id number] Date of Birth/Sex: Apr 13, 1946 (72 y.o. Female) Treating RN: Curtis Sites Primary Care Alpha Mysliwiec: Quintin Alto Other Clinician: Referring Genella Bas: Quintin Alto Treating Analisse Randle/Extender: Altamese [place] in Treatment: 12 Vital Signs Time Taken: 12:48 Temperature (F): 97.8 Height (in): 63 Pulse (bpm): 85 Weight (lbs): 176 Respiratory Rate (breaths/min): 18 Body Mass Index (BMI): 31.2 Blood Pressure (mmHg): 96/55 Reference Range: 80 - 120 mg / dl Electronic Signature(s) Signed: 09/07/2017 5:19:26 PM By: Curtis Sites Entered By: Curtis Sites on 09/07/2017 12:49:45

## 2017-09-10 LAB — AEROBIC CULTURE W GRAM STAIN (SUPERFICIAL SPECIMEN)

## 2017-09-10 LAB — AEROBIC CULTURE  (SUPERFICIAL SPECIMEN)

## 2017-09-13 ENCOUNTER — Encounter: Payer: Self-pay | Admitting: Cardiovascular Disease

## 2017-09-13 ENCOUNTER — Ambulatory Visit: Payer: Medicare Other | Admitting: Cardiovascular Disease

## 2017-09-13 VITALS — BP 93/58 | HR 85 | Ht 63.0 in | Wt 168.6 lb

## 2017-09-13 DIAGNOSIS — I998 Other disorder of circulatory system: Secondary | ICD-10-CM | POA: Diagnosis not present

## 2017-09-13 DIAGNOSIS — I70229 Atherosclerosis of native arteries of extremities with rest pain, unspecified extremity: Secondary | ICD-10-CM

## 2017-09-13 NOTE — Assessment & Plan Note (Signed)
Ms [name] returns today for follow-up. I last saw her approximately one month ago.she is status post left peroneal intervention. Her toes on her left foot are healing well and she is seeing Dr. Leanord Hawkingobson on a regular basis. She remains on dual antiplatelet therapy. We'll get Dopplers on her on a semiannual basis L see her back in 3 months for follow-up.

## 2017-09-13 NOTE — Patient Instructions (Signed)
Medication Instructions: Your physician recommends that you continue on your current medications as directed. Please refer to the Current Medication list given to you today.  Testing/Procedures:  (In 6 months) Your physician has requested that you have a lower extremity arterial duplex. During this test, ultrasound is used to evaluate arterial blood flow in the legs. Allow one hour for this exam. There are no restrictions or special instructions.  Your physician has requested that you have an ankle brachial index (ABI). During this test an ultrasound and blood pressure cuff are used to evaluate the arteries that supply the arms and legs with blood. Allow thirty minutes for this exam. There are no restrictions or special instructions.   Follow-Up: Your physician recommends that you schedule a follow-up appointment in: 3 months with Dr. Allyson SabalBerry.  If you need a refill on your cardiac medications before your next appointment, please call your pharmacy.

## 2017-09-13 NOTE — Progress Notes (Signed)
09/13/2017 [name]   [birth year]  [id number]  Primary Physician Burdine, Virgina Evener, MD Primary Cardiologist: Lorretta Harp MD Lupe Carney, [place]  HPI:  [name] is a 72 y.o.  married Caucasian female mother of 14, grandmother of a grandchildren referred to me by Dr. Domenic Polite and Burdine for evaluation and treatment of critical limb ischemia. 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 I last her in the office 08/16/17.which was approximately 2 weeks post right lower extremity intervention. She has ischemic cardiomyopathy status post bypass grafting injection of [place] present 14 years ago after a myocardial infarction. She has an EF in the 20-25% range with occluded vein grafts, chronic A. Fib on oral anticoagulation. She had outpatient DC cardioversion by Dr. Jeffie Pollock one week ago and is currently back in A. Fib on amiodarone and Eliquis . She developed pain in her right foot last Wednesday which progressed to redness. She is currently taking nonsteroidals every 4 hours for pain relief. Dopplers performed showed monophasic waveforms in her right popliteal artery. Because of a elevated serum creatinine in the mid-2 range she was admitted to [facility] on 05/26/17 for optimization of her renal function and hemodynamic status. Her Eliquis was withheld and she was placed on IV heparin. Her serum creatinine fell to a low of 1.28 today and she presents for angiography and potential intervention for critical limb ischemia. Since I saw her back approximately 6 weeks ago her right foot ischemic ulcers have healed. She has undergone angiography by Dr. Bridgett Larsson on 07/14/17 at the request of her PCP, Dr. Pleas Koch, revealing a patent stent in her right popliteal artery and a patent right peroneal. 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 Bridgett Larsson thought was not percutaneously or surgically addressable and suggested her only option was left BKA. She saw Dr. Dellia Nims back in the  wound care clinic for further evaluation who referred her back to me for consideration of left below-the-knee intervention for limb salvage.I performed peripheral angiography on her2/4/19 and percutaneously revascularize an occluded peroneal with 2 stents. Her posterior tibial is occluded with a previous stent placed elsewhere in her anterior tibial was occluded at the level of the ankle which I was unable to open up. Since that time with aggressive local wound care her toes have begun to heal. She no longer has pain.     Current Meds  Medication Sig  . acetaminophen (TYLENOL) 500 MG tablet Take 1,000 mg by mouth daily as needed for moderate pain.   Marland Kitchen albuterol (PROVENTIL HFA;VENTOLIN HFA) 108 (90 Base) MCG/ACT inhaler Inhale 2 puffs into the lungs every 6 (six) hours as needed for wheezing or shortness of breath.  Marland Kitchen amiodarone (PACERONE) 200 MG tablet Take 1 tablet (200 mg total) daily by mouth.  Marland Kitchen apixaban (ELIQUIS) 5 MG TABS tablet Take 1 tablet (5 mg total) by mouth 2 (two) times daily.  . carvedilol (COREG) 6.25 MG tablet Take 1 tablet (6.25 mg total) by mouth 2 (two) times daily.  . clopidogrel (PLAVIX) 75 MG tablet Take 1 tablet (75 mg total) by mouth daily.  Marland Kitchen HYDROcodone-acetaminophen (NORCO) 10-325 MG tablet TK 1 T PO UP TO QID PRN P  . levothyroxine (SYNTHROID, LEVOTHROID) 175 MCG tablet Take 175 mcg by mouth daily before breakfast.  . losartan (COZAAR) 25 MG tablet Take 0.5 tablets (12.5 mg total) by mouth daily.  . Magnesium 250 MG TABS Take 250 mg by mouth daily.   Marland Kitchen  magnesium hydroxide (MILK OF MAGNESIA) 400 MG/5ML suspension Take 30 mLs by mouth daily as needed for mild constipation.  . nitroGLYCERIN (NITRODUR - DOSED IN MG/24 HR) 0.2 mg/hr patch PLACE 1 PATCH ONTO THE SKIN DAILY AS NEEDED FOR CHEST PAINS  . pantoprazole (PROTONIX) 40 MG tablet Take 1 tablet (40 mg total) by mouth daily.  . potassium chloride (K-DUR,KLOR-CON) 10 MEQ tablet Take 2 tablets (20 mEq total) by mouth 2  (two) times daily.  . promethazine (PHENERGAN) 25 MG tablet Take 25 mg by mouth every 4 (four) hours as needed for nausea/vomiting.  . torsemide (DEMADEX) 20 MG tablet Take 3 tablets (60 mg total) by mouth 2 (two) times daily.     Allergies  Allergen Reactions  . Oxycodone Other (See Comments)    hallucinations   . Pollen Extract Other (See Comments)    Sneezing, running nose.  . Ranexa [Ranolazine] Nausea Only    Headache and nausea  . Statins Other (See Comments)    Cramping, turns skin yellow    Social History   Socioeconomic History  . Marital status: Married    Spouse name: Not on file  . Number of children: Not on file  . Years of education: Not on file  . Highest education level: Not on file  Social Needs  . Financial resource strain: Not very hard  . Food insecurity - worry: Never true  . Food insecurity - inability: Never true  . Transportation needs - medical: No  . Transportation needs - non-medical: No  Occupational History  . Not on file  Tobacco Use  . Smoking status: Former Smoker    Packs/day: 1.00    Years: 21.00    Pack years: 21.00    Types: Cigarettes    Last attempt to quit: 1988    Years since quitting: 31.2  . Smokeless tobacco: Never Used  Substance and Sexual Activity  . Alcohol use: No  . Drug use: No  . Sexual activity: Not Currently    Partners: Male  Other Topics Concern  . Not on file  Social History Narrative  . Not on file     Review of Systems: General: negative for chills, fever, night sweats or weight changes.  Cardiovascular: negative for chest pain, dyspnea on exertion, edema, orthopnea, palpitations, paroxysmal nocturnal dyspnea or shortness of breath Dermatological: negative for rash Respiratory: negative for cough or wheezing Urologic: negative for hematuria Abdominal: negative for nausea, vomiting, diarrhea, bright red blood per rectum, melena, or hematemesis Neurologic: negative for visual changes, syncope, or  dizziness All other systems reviewed and are otherwise negative except as noted above.    Blood pressure (!) 93/58, pulse 85, height 5' 3"  (1.6 m), weight 168 lb 9.6 oz (76.5 kg).  General appearance: alert and no distress Neck: no adenopathy, no carotid bruit, no JVD, supple, symmetrical, trachea midline and thyroid not enlarged, symmetric, no tenderness/mass/nodules Lungs: clear to auscultation bilaterally Heart: regular rate and rhythm, S1, S2 normal, no murmur, click, rub or gallop Extremities: extremities normal, atraumatic, no cyanosis or edema Pulses: 2+ and symmetric Skin: Skin color, texture, turgor normal. No rashes or lesions Neurologic: Alert and oriented X 3, normal strength and tone. Normal symmetric reflexes. Normal coordination and gait  EKG not performed today  ASSESSMENT AND PLAN:   Critical lower limb ischemia [name] returns today for follow-up. I last saw her approximately one month ago.she is status post left peroneal intervention. Her toes on her left foot are healing well  and she is seeing Dr. Dellia Nims on a regular basis. She remains on dual antiplatelet therapy. We'll get Dopplers on her on a semiannual basis L see her back in 3 months for follow-up.      Lorretta Harp MD FACP,FACC,FAHA, [facility] 09/13/2017 11:08 AM

## 2017-09-21 ENCOUNTER — Ambulatory Visit: Payer: Medicare Other | Admitting: Internal Medicine

## 2017-09-26 ENCOUNTER — Other Ambulatory Visit (HOSPITAL_COMMUNITY): Payer: Self-pay | Admitting: Internal Medicine

## 2017-09-28 ENCOUNTER — Ambulatory Visit: Payer: Medicare Other | Admitting: Internal Medicine

## 2017-10-03 ENCOUNTER — Other Ambulatory Visit: Payer: Self-pay | Admitting: Cardiology

## 2017-10-05 ENCOUNTER — Encounter: Payer: Medicare Other | Attending: Internal Medicine | Admitting: Internal Medicine

## 2017-10-05 DIAGNOSIS — I4891 Unspecified atrial fibrillation: Secondary | ICD-10-CM | POA: Diagnosis not present

## 2017-10-05 DIAGNOSIS — J45909 Unspecified asthma, uncomplicated: Secondary | ICD-10-CM | POA: Insufficient documentation

## 2017-10-05 DIAGNOSIS — I499 Cardiac arrhythmia, unspecified: Secondary | ICD-10-CM | POA: Diagnosis not present

## 2017-10-05 DIAGNOSIS — I252 Old myocardial infarction: Secondary | ICD-10-CM | POA: Insufficient documentation

## 2017-10-05 DIAGNOSIS — D649 Anemia, unspecified: Secondary | ICD-10-CM | POA: Diagnosis not present

## 2017-10-05 DIAGNOSIS — I2510 Atherosclerotic heart disease of native coronary artery without angina pectoris: Secondary | ICD-10-CM | POA: Diagnosis not present

## 2017-10-05 DIAGNOSIS — E890 Postprocedural hypothyroidism: Secondary | ICD-10-CM | POA: Diagnosis not present

## 2017-10-05 DIAGNOSIS — E11621 Type 2 diabetes mellitus with foot ulcer: Secondary | ICD-10-CM | POA: Diagnosis not present

## 2017-10-05 DIAGNOSIS — I255 Ischemic cardiomyopathy: Secondary | ICD-10-CM | POA: Insufficient documentation

## 2017-10-05 DIAGNOSIS — I771 Stricture of artery: Secondary | ICD-10-CM | POA: Insufficient documentation

## 2017-10-05 DIAGNOSIS — Z955 Presence of coronary angioplasty implant and graft: Secondary | ICD-10-CM | POA: Diagnosis not present

## 2017-10-05 DIAGNOSIS — L97512 Non-pressure chronic ulcer of other part of right foot with fat layer exposed: Secondary | ICD-10-CM | POA: Diagnosis not present

## 2017-10-05 DIAGNOSIS — E785 Hyperlipidemia, unspecified: Secondary | ICD-10-CM | POA: Insufficient documentation

## 2017-10-05 DIAGNOSIS — Z951 Presence of aortocoronary bypass graft: Secondary | ICD-10-CM | POA: Insufficient documentation

## 2017-10-05 DIAGNOSIS — Z95820 Peripheral vascular angioplasty status with implants and grafts: Secondary | ICD-10-CM | POA: Insufficient documentation

## 2017-10-05 DIAGNOSIS — Z7902 Long term (current) use of antithrombotics/antiplatelets: Secondary | ICD-10-CM | POA: Insufficient documentation

## 2017-10-05 DIAGNOSIS — Z87891 Personal history of nicotine dependence: Secondary | ICD-10-CM | POA: Insufficient documentation

## 2017-10-05 DIAGNOSIS — I110 Hypertensive heart disease with heart failure: Secondary | ICD-10-CM | POA: Diagnosis not present

## 2017-10-05 DIAGNOSIS — E1151 Type 2 diabetes mellitus with diabetic peripheral angiopathy without gangrene: Secondary | ICD-10-CM | POA: Diagnosis present

## 2017-10-05 DIAGNOSIS — I70201 Unspecified atherosclerosis of native arteries of extremities, right leg: Secondary | ICD-10-CM | POA: Insufficient documentation

## 2017-10-05 DIAGNOSIS — E1142 Type 2 diabetes mellitus with diabetic polyneuropathy: Secondary | ICD-10-CM | POA: Diagnosis not present

## 2017-10-05 DIAGNOSIS — I509 Heart failure, unspecified: Secondary | ICD-10-CM | POA: Insufficient documentation

## 2017-10-07 NOTE — Progress Notes (Signed)
RUBEN, PYKA ([id number]) Visit Report for 10/05/2017 Arrival Information Details Patient Name: [name], [name] Date of Service: 10/05/2017 9:45 AM Medical Record Number: [id number] Patient Account Number: [id number] Date of Birth/Sex: [birth year] (72 y.o. F) Treating RN: Renne Crigler Primary Care Cece Milhouse: Quintin Alto Other Clinician: Referring Worthy Boschert: Quintin Alto Treating Kosisochukwu Burningham/Extender: Altamese [place] in Treatment: 16 Visit Information History Since Last Visit All ordered tests and consults were completed: No Patient Arrived: Walker Added or deleted any medications: No Arrival Time: 09:50 Any new allergies or adverse reactions: No Accompanied By: spouse Had a fall or experienced change in No Transfer Assistance: None activities of daily living that may affect Patient Identification Verified: Yes risk of falls: Secondary Verification Process Yes Signs or symptoms of abuse/neglect since last visito No Completed: Hospitalized since last visit: No Patient Requires Transmission-Based No Implantable device outside of the clinic excluding No Precautions: cellular tissue based products placed in the center Patient Has Alerts: Yes since last visit: Patient Alerts: Patient on Blood Pain Present Now: No Thinner Prediabetic Eliquis Electronic Signature(s) Signed: 10/05/2017 12:48:09 PM By: Renne Crigler Entered By: Renne Crigler on 10/05/2017 09:51:11 Gwinn, [name] ([id number]) -------------------------------------------------------------------------------- Clinic Level of Care Assessment Details Patient Name: [name] Date of Service: 10/05/2017 9:45 AM Medical Record Number: [id number] Patient Account Number: [id number] Date of Birth/Sex: Oct 31, 1945 (71 y.o. F) Treating RN: Huel Coventry Primary Care Hedwig Mcfall: Quintin Alto Other Clinician: Referring Loys Hoselton: Quintin Alto Treating Genene Kilman/Extender: Altamese [place] in  Treatment: 16 Clinic Level of Care Assessment Items TOOL 4 Quantity Score []  - Use when only an EandM is performed on FOLLOW-UP visit 0 ASSESSMENTS - Nursing Assessment / Reassessment []  - Reassessment of Co-morbidities (includes updates in patient status) 0 X- 1 5 Reassessment of Adherence to Treatment Plan ASSESSMENTS - Wound and Skin Assessment / Reassessment []  - Simple Wound Assessment / Reassessment - one wound 0 X- 6 5 Complex Wound Assessment / Reassessment - multiple wounds []  - 0 Dermatologic / Skin Assessment (not related to wound area) ASSESSMENTS - Focused Assessment []  - Circumferential Edema Measurements - multi extremities 0 []  - 0 Nutritional Assessment / Counseling / Intervention []  - 0 Lower Extremity Assessment (monofilament, tuning fork, pulses) []  - 0 Peripheral Arterial Disease Assessment (using hand held doppler) ASSESSMENTS - Ostomy and/or Continence Assessment and Care []  - Incontinence Assessment and Management 0 []  - 0 Ostomy Care Assessment and Management (repouching, etc.) PROCESS - Coordination of Care X - Simple Patient / Family Education for ongoing care 1 15 []  - 0 Complex (extensive) Patient / Family Education for ongoing care []  - 0 Staff obtains Chiropractor, Records, Test Results / Process Orders []  - 0 Staff telephones HHA, Nursing Homes / Clarify orders / etc []  - 0 Routine Transfer to another Facility (non-emergent condition) []  - 0 Routine Hospital Admission (non-emergent condition) []  - 0 New Admissions / Manufacturing engineer / Ordering NPWT, Apligraf, etc. []  - 0 Emergency Hospital Admission (emergent condition) X- 1 10 Simple Discharge Coordination Schumm, Trana G. ([id number]) []  - 0 Complex (extensive) Discharge Coordination PROCESS - Special Needs []  - Pediatric / Minor Patient Management 0 []  - 0 Isolation Patient Management []  - 0 Hearing / Language / Visual special needs []  - 0 Assessment of Community assistance  (transportation, D/C planning, etc.) []  - 0 Additional assistance / Altered mentation []  - 0 Support Surface(s) Assessment (bed, cushion, seat, etc.) INTERVENTIONS - Wound Cleansing / Measurement []  - Simple Wound Cleansing - one wound  0 X- 6 5 Complex Wound Cleansing - multiple wounds X- 1 5 Wound Imaging (photographs - any number of wounds) []  - 0 Wound Tracing (instead of photographs) []  - 0 Simple Wound Measurement - one wound X- 6 5 Complex Wound Measurement - multiple wounds INTERVENTIONS - Wound Dressings []  - Small Wound Dressing one or multiple wounds 0 []  - 0 Medium Wound Dressing one or multiple wounds X- 2 20 Large Wound Dressing one or multiple wounds []  - 0 Application of Medications - topical []  - 0 Application of Medications - injection INTERVENTIONS - Miscellaneous []  - External ear exam 0 []  - 0 Specimen Collection (cultures, biopsies, blood, body fluids, etc.) []  - 0 Specimen(s) / Culture(s) sent or taken to Lab for analysis []  - 0 Patient Transfer (multiple staff / Nurse, adult / Similar devices) []  - 0 Simple Staple / Suture removal (25 or less) []  - 0 Complex Staple / Suture removal (26 or more) []  - 0 Hypo / Hyperglycemic Management (close monitor of Blood Glucose) []  - 0 Ankle / Brachial Index (ABI) - do not check if billed separately X- 1 5 Vital Signs Bocek, Lakeya G. ([id number]) Has the patient been seen at the hospital within the last three years: Yes Total Score: 170 Level Of Care: New/Established - Level 5 Electronic Signature(s) Signed: 10/05/2017 1:43:28 PM By: Elliot Gurney, BSN, RN, CWS, Kim RN, BSN Entered By: Elliot Gurney, BSN, RN, CWS, Kim on 10/05/2017 10:29:33 [place], [name] ([id number]) -------------------------------------------------------------------------------- Encounter Discharge Information Details Patient Name: [name] Date of Service: 10/05/2017 9:45 AM Medical Record Number: [id number] Patient Account Number:  [id number] Date of Birth/Sex: 1946-03-13 (71 y.o. F) Treating RN: Huel Coventry Primary Care Jodelle Fausto: Quintin Alto Other Clinician: Referring Chan Sheahan: Quintin Alto Treating Donnell Wion/Extender: Altamese [place] in Treatment: 16 Encounter Discharge Information Items Discharge Pain Level: 4 Discharge Condition: Stable Ambulatory Status: Walker Discharge Destination: Home Transportation: Private Auto Accompanied By: spouse Schedule Follow-up Appointment: Yes Medication Reconciliation completed and No provided to Patient/Care Mayer Vondrak: Provided on Clinical Summary of Care: 10/05/2017 Form Type Recipient Paper Patient MD Electronic Signature(s) Signed: 10/05/2017 12:48:09 PM By: Renne Crigler Entered By: Renne Crigler on 10/05/2017 10:46:46 Monette, [name] ([id number]) -------------------------------------------------------------------------------- Lower Extremity Assessment Details Patient Name: [name] Date of Service: 10/05/2017 9:45 AM Medical Record Number: [id number] Patient Account Number: [id number] Date of Birth/Sex: Oct 28, 1945 (71 y.o. F) Treating RN: Curtis Sites Primary Care Kai Calico: Quintin Alto Other Clinician: Referring Amarian Botero: Quintin Alto Treating Tan Clopper/Extender: Altamese [place] in Treatment: 16 Vascular Assessment Pulses: Dorsalis Pedis Palpable: [Left:Yes] [Right:Yes] Posterior Tibial Extremity colors, hair growth, and conditions: Extremity Color: [Left:Normal] [Right:Normal] Hair Growth on Extremity: [Left:Yes] [Right:Yes] Temperature of Extremity: [Left:Cool] [Right:Cool] Capillary Refill: [Left:< 3 seconds] [Right:< 3 seconds] Electronic Signature(s) Signed: 10/05/2017 5:03:40 PM By: Curtis Sites Entered By: Curtis Sites on 10/05/2017 10:07:47 Hunsinger, [name] ([id number]) -------------------------------------------------------------------------------- Multi Wound Chart Details Patient Name: [name] Date of Service: 10/05/2017 9:45 AM Medical Record Number: [id number] Patient Account Number: [id number] Date of Birth/Sex: [birth year] (72 y.o. F) Treating RN: Huel Coventry Primary Care Nari Vannatter: Quintin Alto Other Clinician: Referring Jayme Cham: Quintin Alto Treating Promyse Ardito/Extender: Altamese [place] in Treatment: 16 Vital Signs Height(in): 63 Pulse(bpm): 75 Weight(lbs): 176 Blood Pressure(mmHg): 96/53 Body Mass Index(BMI): 31 Temperature(F): 98.8 Respiratory Rate 18 (breaths/min): Photos: [1:No Photos] [2:No Photos] [4:No Photos] Wound Location: [1:Right Toe - Web between 2nd and 3rd - Distal] [2:Right Toe - Web between 3rd and 4th] [4:Right Toe Fifth -  Dorsal] Wounding Event: [1:Gradually Appeared] [2:Pressure Injury] [4:Gradually Appeared] Primary Etiology: [1:Arterial Insufficiency Ulcer] [2:Arterial Insufficiency Ulcer] [4:Arterial Insufficiency Ulcer] Comorbid History: [1:Anemia, Arrhythmia, Congestive Heart Failure, Hypertension, Myocardial Infarction, Peripheral Arterial Disease, Neuropathy] [2:Anemia, Arrhythmia, Congestive Heart Failure, Hypertension, Myocardial Infarction, Peripheral Arterial  Disease, Neuropathy] [4:Anemia, Arrhythmia, Congestive Heart Failure, Hypertension, Myocardial Infarction, Peripheral Arterial Disease, Neuropathy] Date Acquired: [1:05/30/2017] [2:02/21/2017] [4:07/19/2017] Weeks of Treatment: [1:16] [2:15] [4:11] Wound Status: [1:Open] [2:Open] [4:Open] Clustered Wound: [1:Yes] [2:No] [4:No] Pending Amputation on [1:Yes] [2:No] [4:No] Presentation: Measurements L x W x D [1:0.8x0.5x0.1] [2:0.1x0.1x0.1] [4:0.1x0.1x0.1] (cm) Area (cm) : [1:0.314] [2:0.008] [4:0.008] Volume (cm) : [1:0.031] [2:0.001] [4:0.001] % Reduction in Area: [1:98.80%] [2:99.50%] [4:74.20%] % Reduction in Volume: [1:98.80%] [2:99.30%] [4:66.70%] Classification: [1:Full Thickness Without Exposed Support Structures] [2:Partial Thickness] [4:Partial  Thickness] Exudate Amount: [1:Large] [2:Large] [4:None Present] Exudate Type: [1:Serous] [2:Serous] [4:N/A] Exudate Color: [1:amber] [2:amber] [4:N/A] Wound Margin: [1:Flat and Intact] [2:Flat and Intact] [4:Flat and Intact] Granulation Amount: [1:Large (67-100%)] [2:Large (67-100%)] [4:None Present (0%)] Granulation Quality: [1:Red] [2:Red] [4:N/A] Necrotic Amount: [1:None Present (0%)] [2:None Present (0%)] [4:Large (67-100%)] Necrotic Tissue: [1:N/A] [2:N/A] [4:Eschar] Exposed Structures: [1:Fascia: No Fat Layer (Subcutaneous Tissue) Exposed: No Tendon: No Muscle: No] [2:Fascia: No Fat Layer (Subcutaneous Tissue) Exposed: No Tendon: No Muscle: No] [4:Fascia: No Fat Layer (Subcutaneous Tissue) Exposed: No Tendon: No Muscle: No] Joint: No Joint: No Joint: No Bone: No Bone: No Bone: No Epithelialization: None None None Periwound Skin Texture: Excoriation: No Excoriation: No Excoriation: No Induration: No Induration: No Induration: No Callus: No Callus: No Callus: No Crepitus: No Crepitus: No Crepitus: No Rash: No Rash: No Rash: No Scarring: No Scarring: No Scarring: No Periwound Skin Moisture: Maceration: No Maceration: No Maceration: No Dry/Scaly: No Dry/Scaly: No Dry/Scaly: No Periwound Skin Color: Atrophie Blanche: No Cyanosis: Yes Cyanosis: Yes Cyanosis: No Atrophie Blanche: No Atrophie Blanche: No Ecchymosis: No Ecchymosis: No Ecchymosis: No Erythema: No Erythema: No Erythema: No Hemosiderin Staining: No Hemosiderin Staining: No Hemosiderin Staining: No Mottled: No Mottled: No Mottled: No Pallor: No Pallor: No Pallor: No Rubor: No Rubor: No Rubor: No Temperature: No Abnormality No Abnormality No Abnormality Tenderness on Palpation: Yes Yes Yes Wound Preparation: Ulcer Cleansing: Ulcer Cleansing: Ulcer Cleansing: Rinsed/Irrigated with Saline Rinsed/Irrigated with Saline Rinsed/Irrigated with Saline Topical Anesthetic Applied: Topical  Anesthetic Applied: Topical Anesthetic Applied: Other: lidocaine 4% Other: lidocaine 4% Other: lidocaine 4% Wound Number: [id number]  Photos: No Photos No Photos No Photos Wound Location: Left Toe Great - Distal Left Toe Great - Lateral Left Toe Third Wounding Event: Gradually Appeared Gradually Appeared Gradually Appeared 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 Comorbid History: Anemia, Arrhythmia, Anemia, Arrhythmia, Anemia, Arrhythmia, Congestive Heart Failure, Congestive Heart Failure, Congestive Heart Failure, Hypertension, Myocardial Hypertension, Myocardial Hypertension, Myocardial Infarction, Peripheral Arterial Infarction, Peripheral Arterial Infarction, Peripheral Arterial Disease, Neuropathy Disease, Neuropathy Disease, Neuropathy Date Acquired: 07/17/2017 07/17/2017 07/16/2017 Weeks of Treatment: 11 11 11  Wound Status: Open Open Open Clustered Wound: No No No Pending Amputation on No No No Presentation: Measurements L x W x D 2.3x2.2x0.1 1.4x0.6x0.1 1.5x1.5x0.1 (cm) Area (cm) : 3.974 0.66 1.767 Volume (cm) : 0.397 0.066 0.177 % Reduction in Area: -153.00% 78.80% -134.40% % Reduction in Volume: -2381.20% 78.80% -136.00% Classification: Partial Thickness Partial Thickness Partial Thickness Exudate Amount: Medium Large Medium Exudate Type: Serous Serous Serous Exudate Color: amber amber amber Wound Margin: Flat and Intact Flat and Intact Flat and Intact Granulation Amount: None Present (0%) Medium (34-66%) None Present (0%) Granulation Quality: N/A Pink  N/A Necrotic Amount: Large (67-100%) Medium (34-66%) Large (67-100%) Hilbert, Marena G. ([id number]) Necrotic Tissue: Eschar, Adherent Slough Adherent Slough Eschar Exposed Structures: Fascia: No Fascia: No Fascia: No Fat Layer (Subcutaneous Fat Layer (Subcutaneous Fat Layer (Subcutaneous Tissue) Exposed: No Tissue) Exposed: No Tissue) Exposed: No Tendon:  No Tendon: No Tendon: No Muscle: No Muscle: No Muscle: No Joint: No Joint: No Joint: No Bone: No Bone: No Bone: No Epithelialization: None None None Periwound Skin Texture: Excoriation: No Excoriation: No Excoriation: No Induration: No Induration: No Induration: No Callus: No Callus: No Callus: No Crepitus: No Crepitus: No Crepitus: No Rash: No Rash: No Rash: No Scarring: No Scarring: No Scarring: No Periwound Skin Moisture: Maceration: No Maceration: No Maceration: No Dry/Scaly: No Dry/Scaly: No Dry/Scaly: No Periwound Skin Color: Cyanosis: Yes Cyanosis: Yes Cyanosis: Yes Atrophie Blanche: No Atrophie Blanche: No Atrophie Blanche: No Ecchymosis: No Ecchymosis: No Ecchymosis: No Erythema: No Erythema: No Erythema: No Hemosiderin Staining: No Hemosiderin Staining: No Hemosiderin Staining: No Mottled: No Mottled: No Mottled: No Pallor: No Pallor: No Pallor: No Rubor: No Rubor: No Rubor: No Temperature: No Abnormality No Abnormality No Abnormality Tenderness on Palpation: Yes Yes Yes Wound Preparation: Ulcer Cleansing: Ulcer Cleansing: Ulcer Cleansing: Rinsed/Irrigated with Saline Rinsed/Irrigated with Saline Rinsed/Irrigated with Saline Topical Anesthetic Applied: Topical Anesthetic Applied: Topical Anesthetic Applied: Other: lidocaine 4% Other: lidocaine 4% Other: lidocaine 4% Treatment Notes Wound #1 (Right, Distal Toe - Web between 2nd and 3rd) 1. Cleansed with: Clean wound with Normal Saline 2. Anesthetic Topical Lidocaine 4% cream to wound bed prior to debridement 4. Dressing Applied: Other dressing (specify in notes) 5. Secondary Dressing Applied Dry Gauze Notes silvercell dry gauze and conform with tape to cover Wound #2 (Right Toe - Web between 3rd and 4th) 1. Cleansed with: Clean wound with Normal Saline 2. Anesthetic Topical Lidocaine 4% cream to wound bed prior to debridement 4. Dressing Applied: Other dressing  (specify in notes) 5. Secondary Dressing Applied Dry Gauze Dillenburg, Tahlor G. ([id number]) Notes silvercell dry gauze and conform with tape to cover Wound #4 (Right, Dorsal Toe Fifth) 1. Cleansed with: Clean wound with Normal Saline 2. Anesthetic Topical Lidocaine 4% cream to wound bed prior to debridement 4. Dressing Applied: Other dressing (specify in notes) 5. Secondary Dressing Applied Dry Gauze Notes silvercell dry gauze and conform with tape to cover Wound #5 (Left, Distal Toe Great) 1. Cleansed with: Clean wound with Normal Saline 2. Anesthetic Topical Lidocaine 4% cream to wound bed prior to debridement 4. Dressing Applied: Other dressing (specify in notes) 5. Secondary Dressing Applied Dry Gauze Kerlix/Conform Notes betadine iodine and sivercell Wound #6 (Left, Lateral Toe Great) 1. Cleansed with: Clean wound with Normal Saline 2. Anesthetic Topical Lidocaine 4% cream to wound bed prior to debridement 4. Dressing Applied: Other dressing (specify in notes) 5. Secondary Dressing Applied Dry Gauze Kerlix/Conform Notes betadine iodine and sivercell Wound #7 (Left Toe Third) 1. Cleansed with: Clean wound with Normal Saline 2. Anesthetic Topical Lidocaine 4% cream to wound bed prior to debridement 4. Dressing Applied: Other dressing (specify in notes) 5. Secondary Dressing Applied Dry Gauze Detore, Ellason Reece Agar ([id number]) Kerlix/Conform Notes betadine iodine and sivercell Electronic Signature(s) Signed: 10/05/2017 5:02:04 PM By: Baltazar Najjar MD Entered By: Baltazar Najjar on 10/05/2017 10:50:29 Duhon, [name] ([id number]) -------------------------------------------------------------------------------- Multi-Disciplinary Care Plan Details Patient Name: [name] Date of Service: 10/05/2017 9:45 AM Medical Record Number: [id number] Patient Account Number: [id number] Date of Birth/Sex: 07/10/1945 (71 y.o. F) Treating RN: Huel Coventry Primary Care  Staisha Winiarski: Quintin Alto Other Clinician: Referring Noor Witte: Quintin Alto Treating Samule Life/Extender: Altamese [place] in Treatment: 16 Active Inactive ` Abuse / Safety / Falls / Self Care Management Nursing Diagnoses: History of Falls Impaired home maintenance Potential for falls Goals: Patient will not experience any injury related to falls Date Initiated: 06/13/2017 Target Resolution Date: 07/14/2017 Goal Status: Active Patient will remain injury free related to falls Date Initiated: 06/13/2017 Target Resolution Date: 07/14/2017 Goal Status: Active Interventions: Assess fall risk on admission and as needed Treatment Activities: Patient referred to home care : 06/13/2017 Notes: ` Necrotic Tissue Nursing Diagnoses: Impaired tissue integrity related to necrotic/devitalized tissue Goals: Necrotic/devitalized tissue will be minimized in the wound bed Date Initiated: 06/13/2017 Target Resolution Date: 07/14/2017 Goal Status: Active Interventions: Assess patient pain level pre-, during and post procedure and prior to discharge Provide education on necrotic tissue and debridement process Treatment Activities: Apply topical anesthetic as ordered : 06/13/2017 Notes: Badger, [name] ([id number]) ` Orientation to the Wound Care Program Nursing Diagnoses: Knowledge deficit related to the wound healing center program Goals: Patient/caregiver will verbalize understanding of the Wound Healing Center Program Date Initiated: 06/13/2017 Target Resolution Date: 07/14/2017 Goal Status: Active Interventions: Provide education on orientation to the wound center Notes: ` Wound/Skin Impairment Nursing Diagnoses: Impaired tissue integrity Knowledge deficit related to ulceration/compromised skin integrity Goals: Ulcer/skin breakdown will have a volume reduction of 30% by week 4 Date Initiated: 06/13/2017 Target Resolution Date: 07/14/2017 Goal Status:  Active Interventions: Assess ulceration(s) every visit Treatment Activities: Patient referred to home care : 06/13/2017 Topical wound management initiated : 06/13/2017 Notes: Electronic Signature(s) Signed: 10/05/2017 1:43:28 PM By: Elliot Gurney, BSN, RN, CWS, Kim RN, BSN Entered By: Elliot Gurney, BSN, RN, CWS, Kim on 10/05/2017 10:17:49 Doyle, [name] ([id number]) -------------------------------------------------------------------------------- Pain Assessment Details Patient Name: [name] Date of Service: 10/05/2017 9:45 AM Medical Record Number: [id number] Patient Account Number: [id number] Date of Birth/Sex: 1945-10-11 (71 y.o. F) Treating RN: Renne Crigler Primary Care Adrienne Trombetta: Quintin Alto Other Clinician: Referring Sueellen Kayes: Quintin Alto Treating Zayden Maffei/Extender: Altamese [place] in Treatment: 16 Active Problems Location of Pain Severity and Description of Pain Patient Has Paino No Site Locations Pain Management and Medication Current Pain Management: Electronic Signature(s) Signed: 10/05/2017 12:48:09 PM By: Renne Crigler Entered By: Renne Crigler on 10/05/2017 09:51:19 Coiner, [name] ([id number]) -------------------------------------------------------------------------------- Patient/Caregiver Education Details Patient Name: [name] Date of Service: 10/05/2017 9:45 AM Medical Record Number: [id number] Patient Account Number: [id number] Date of Birth/Gender: August 28, 1945 (71 y.o. F) Treating RN: Renne Crigler Primary Care Physician: Quintin Alto Other Clinician: Referring Physician: Quintin Alto Treating Physician/Extender: Altamese [place] in Treatment: 16 Education Assessment Education Provided To: Patient and Caregiver Education Topics Provided Wound/Skin Impairment: Handouts: Caring for Your Ulcer Methods: Explain/Verbal Responses: State content correctly Electronic Signature(s) Signed: 10/05/2017 12:48:09 PM By:  Renne Crigler Entered By: Renne Crigler on 10/05/2017 10:47:01 Lefevers, [name] ([id number]) -------------------------------------------------------------------------------- Wound Assessment Details Patient Name: [name] Date of Service: 10/05/2017 9:45 AM Medical Record Number: [id number] Patient Account Number: [id number] Date of Birth/Sex: 07-08-1945 (72 y.o. F) Treating RN: Curtis Sites Primary Care Wirt Hemmerich: Quintin Alto Other Clinician: Referring Amyia Lodwick: Quintin Alto Treating Karron Goens/Extender: Altamese [place] in Treatment: 16 Wound Status Wound Number: 1 Primary Arterial Insufficiency Ulcer Etiology: Wound Location: Right Toe - Web between 2nd and 3rd - Distal Wound Open Status: Wounding Event: Gradually Appeared Comorbid Anemia, Arrhythmia, Congestive Heart Failure, Date Acquired: 05/30/2017 History: Hypertension, Myocardial Infarction, Peripheral Weeks Of Treatment: 16  Arterial Disease, Neuropathy Clustered Wound: Yes Pending Amputation On Presentation Photos Photo Uploaded By: Curtis Sites on 10/05/2017 11:55:19 Wound Measurements Length: (cm) 0.8 Width: (cm) 0.5 Depth: (cm) 0.1 Area: (cm) 0.314 Volume: (cm) 0.031 % Reduction in Area: 98.8% % Reduction in Volume: 98.8% Epithelialization: None Tunneling: No Undermining: No Wound Description Full Thickness Without Exposed Support Classification: Structures Wound Margin: Flat and Intact Exudate Large Amount: Exudate Type: Serous Exudate Color: amber Foul Odor After Cleansing: No Slough/Fibrino No Wound Bed Granulation Amount: Large (67-100%) Exposed Structure Granulation Quality: Red Fascia Exposed: No Necrotic Amount: None Present (0%) Fat Layer (Subcutaneous Tissue) Exposed: No Tendon Exposed: No Muscle Exposed: No Joint Exposed: No Reith, Rolando G. ([id number]) Bone Exposed: No Periwound Skin Texture Texture Color No Abnormalities Noted: No No Abnormalities  Noted: No Callus: No Atrophie Blanche: No Crepitus: No Cyanosis: No Excoriation: No Ecchymosis: No Induration: No Erythema: No Rash: No Hemosiderin Staining: No Scarring: No Mottled: No Pallor: No Moisture Rubor: No No Abnormalities Noted: No Dry / Scaly: No Temperature / Pain Maceration: No Temperature: No Abnormality Tenderness on Palpation: Yes Wound Preparation Ulcer Cleansing: Rinsed/Irrigated with Saline Topical Anesthetic Applied: Other: lidocaine 4%, Treatment Notes Wound #1 (Right, Distal Toe - Web between 2nd and 3rd) 1. Cleansed with: Clean wound with Normal Saline 2. Anesthetic Topical Lidocaine 4% cream to wound bed prior to debridement 4. Dressing Applied: Other dressing (specify in notes) 5. Secondary Dressing Applied Dry Gauze Notes silvercell dry gauze and conform with tape to cover Electronic Signature(s) Signed: 10/05/2017 5:03:40 PM By: Curtis Sites Entered By: Curtis Sites on 10/05/2017 10:00:06 Stahlman, [name] ([id number]) -------------------------------------------------------------------------------- Wound Assessment Details Patient Name: [name] Date of Service: 10/05/2017 9:45 AM Medical Record Number: [id number] Patient Account Number: [id number] Date of Birth/Sex: 27-Jan-1946 (72 y.o. F) Treating RN: Curtis Sites Primary Care Fionnuala Hemmerich: Quintin Alto Other Clinician: Referring Holden Maniscalco: Quintin Alto Treating Jeanmarie Mccowen/Extender: Altamese [place] in Treatment: 16 Wound Status Wound Number: 2 Primary Arterial Insufficiency Ulcer Etiology: Wound Location: Right Toe - Web between 3rd and 4th Wound Open Wounding Event: Pressure Injury Status: Date Acquired: 02/21/2017 Comorbid Anemia, Arrhythmia, Congestive Heart Failure, Weeks Of Treatment: 15 History: Hypertension, Myocardial Infarction, Peripheral Clustered Wound: No Arterial Disease, Neuropathy Photos Photo Uploaded By: Curtis Sites on 10/05/2017  11:55:28 Wound Measurements Length: (cm) 0.1 Width: (cm) 0.1 Depth: (cm) 0.1 Area: (cm) 0.008 Volume: (cm) 0.001 % Reduction in Area: 99.5% % Reduction in Volume: 99.3% Epithelialization: None Tunneling: No Undermining: No Wound Description Classification: Partial Thickness Wound Margin: Flat and Intact Exudate Amount: Large Exudate Type: Serous Exudate Color: amber Foul Odor After Cleansing: No Slough/Fibrino No Wound Bed Granulation Amount: Large (67-100%) Exposed Structure Granulation Quality: Red Fascia Exposed: No Necrotic Amount: None Present (0%) Fat Layer (Subcutaneous Tissue) Exposed: No Tendon Exposed: No Muscle Exposed: No Joint Exposed: No Bone Exposed: No Periwound Skin Texture Frady, Malavika G. ([id number]) Texture Color No Abnormalities Noted: No No Abnormalities Noted: No Callus: No Atrophie Blanche: No Crepitus: No Cyanosis: Yes Excoriation: No Ecchymosis: No Induration: No Erythema: No Rash: No Hemosiderin Staining: No Scarring: No Mottled: No Pallor: No Moisture Rubor: No No Abnormalities Noted: No Dry / Scaly: No Temperature / Pain Maceration: No Temperature: No Abnormality Tenderness on Palpation: Yes Wound Preparation Ulcer Cleansing: Rinsed/Irrigated with Saline Topical Anesthetic Applied: Other: lidocaine 4%, Treatment Notes Wound #2 (Right Toe - Web between 3rd and 4th) 1. Cleansed with: Clean wound with Normal Saline 2. Anesthetic Topical Lidocaine 4% cream to wound bed prior  to debridement 4. Dressing Applied: Other dressing (specify in notes) 5. Secondary Dressing Applied Dry Gauze Notes silvercell dry gauze and conform with tape to cover Electronic Signature(s) Signed: 10/05/2017 5:03:40 PM By: Curtis Sites Entered By: Curtis Sites on 10/05/2017 10:02:43 Shaffer, [name] ([id number]) -------------------------------------------------------------------------------- Wound Assessment Details Patient Name:  [name] Date of Service: 10/05/2017 9:45 AM Medical Record Number: [id number] Patient Account Number: [id number] Date of Birth/Sex: 29-Aug-1945 (71 y.o. F) Treating RN: Curtis Sites Primary Care Maegan Buller: Quintin Alto Other Clinician: Referring Ketra Duchesne: Quintin Alto Treating Gasper Hopes/Extender: Altamese [place] in Treatment: 16 Wound Status Wound Number: 4 Primary Arterial Insufficiency Ulcer Etiology: Wound Location: Right Toe Fifth - Dorsal Wound Open Wounding Event: Gradually Appeared Status: Date Acquired: 07/19/2017 Comorbid Anemia, Arrhythmia, Congestive Heart Failure, Weeks Of Treatment: 11 History: Hypertension, Myocardial Infarction, Peripheral Clustered Wound: No Arterial Disease, Neuropathy Photos Photo Uploaded By: Curtis Sites on 10/05/2017 11:56:32 Wound Measurements Length: (cm) 0.1 Width: (cm) 0.1 Depth: (cm) 0.1 Area: (cm) 0.008 Volume: (cm) 0.001 % Reduction in Area: 74.2% % Reduction in Volume: 66.7% Epithelialization: None Tunneling: No Undermining: No Wound Description Classification: Partial Thickness Foul O Wound Margin: Flat and Intact Slough Exudate Amount: None Present dor After Cleansing: No /Fibrino No Wound Bed Granulation Amount: None Present (0%) Exposed Structure Necrotic Amount: Large (67-100%) Fascia Exposed: No Necrotic Quality: Eschar Fat Layer (Subcutaneous Tissue) Exposed: No Tendon Exposed: No Muscle Exposed: No Joint Exposed: No Bone Exposed: No Periwound Skin Texture Texture Color No Abnormalities Noted: No No Abnormalities Noted: No Millay, Admire G. ([id number]) Callus: No Atrophie Blanche: No Crepitus: No Cyanosis: Yes Excoriation: No Ecchymosis: No Induration: No Erythema: No Rash: No Hemosiderin Staining: No Scarring: No Mottled: No Pallor: No Moisture Rubor: No No Abnormalities Noted: No Dry / Scaly: No Temperature / Pain Maceration: No Temperature: No  Abnormality Tenderness on Palpation: Yes Wound Preparation Ulcer Cleansing: Rinsed/Irrigated with Saline Topical Anesthetic Applied: Other: lidocaine 4%, Treatment Notes Wound #4 (Right, Dorsal Toe Fifth) 1. Cleansed with: Clean wound with Normal Saline 2. Anesthetic Topical Lidocaine 4% cream to wound bed prior to debridement 4. Dressing Applied: Other dressing (specify in notes) 5. Secondary Dressing Applied Dry Gauze Notes silvercell dry gauze and conform with tape to cover Electronic Signature(s) Signed: 10/05/2017 5:03:40 PM By: Curtis Sites Entered By: Curtis Sites on 10/05/2017 10:03:44 Frumkin, [name] ([id number]) -------------------------------------------------------------------------------- Wound Assessment Details Patient Name: [name] Date of Service: 10/05/2017 9:45 AM Medical Record Number: [id number] Patient Account Number: [id number] Date of Birth/Sex: [birth year] (71 y.o. F) Treating RN: Curtis Sites Primary Care Fantasy Donald: Quintin Alto Other Clinician: Referring Elieser Tetrick: Quintin Alto Treating Dorsey Charette/Extender: Altamese [place] in Treatment: 16 Wound Status Wound Number: 5 Primary Arterial Insufficiency Ulcer Etiology: Wound Location: Left Toe Great - Distal Wound Open Wounding Event: Gradually Appeared Status: Date Acquired: 07/17/2017 Comorbid Anemia, Arrhythmia, Congestive Heart Failure, Weeks Of Treatment: 11 History: Hypertension, Myocardial Infarction, Peripheral Clustered Wound: No Arterial Disease, Neuropathy Photos Photo Uploaded By: Curtis Sites on 10/05/2017 11:56:38 Wound Measurements Length: (cm) 2.3 Width: (cm) 2.2 Depth: (cm) 0.1 Area: (cm) 3.974 Volume: (cm) 0.397 % Reduction in Area: -153% % Reduction in Volume: -2381.2% Epithelialization: None Tunneling: No Undermining: No Wound Description Classification: Partial Thickness Foul O Wound Margin: Flat and Intact Slough Exudate Amount:  Medium Exudate Type: Serous Exudate Color: amber dor After Cleansing: No /Fibrino Yes Wound Bed Granulation Amount: None Present (0%) Exposed Structure Necrotic Amount: Large (67-100%) Fascia Exposed: No Necrotic Quality: Eschar, Adherent Slough Fat  Layer (Subcutaneous Tissue) Exposed: No Tendon Exposed: No Muscle Exposed: No Joint Exposed: No Bone Exposed: No Periwound Skin Texture Ganesh, Ayaan G. ([id number]) Texture Color No Abnormalities Noted: No No Abnormalities Noted: No Callus: No Atrophie Blanche: No Crepitus: No Cyanosis: Yes Excoriation: No Ecchymosis: No Induration: No Erythema: No Rash: No Hemosiderin Staining: No Scarring: No Mottled: No Pallor: No Moisture Rubor: No No Abnormalities Noted: No Dry / Scaly: No Temperature / Pain Maceration: No Temperature: No Abnormality Tenderness on Palpation: Yes Wound Preparation Ulcer Cleansing: Rinsed/Irrigated with Saline Topical Anesthetic Applied: Other: lidocaine 4%, Treatment Notes Wound #5 (Left, Distal Toe Great) 1. Cleansed with: Clean wound with Normal Saline 2. Anesthetic Topical Lidocaine 4% cream to wound bed prior to debridement 4. Dressing Applied: Other dressing (specify in notes) 5. Secondary Dressing Applied Dry Gauze Kerlix/Conform Notes betadine iodine and sivercell Electronic Signature(s) Signed: 10/05/2017 5:03:40 PM By: Curtis Sites Entered By: Curtis Sites on 10/05/2017 10:04:50 Troost, [name] ([id number]) -------------------------------------------------------------------------------- Wound Assessment Details Patient Name: [name] Date of Service: 10/05/2017 9:45 AM Medical Record Number: [id number] Patient Account Number: [id number] Date of Birth/Sex: 06/10/1946 (72 y.o. F) Treating RN: Curtis Sites Primary Care Kelicia Youtz: Quintin Alto Other Clinician: Referring Perlie Stene: Quintin Alto Treating Nasim Garofano/Extender: Altamese [place] in Treatment:  16 Wound Status Wound Number: 6 Primary Arterial Insufficiency Ulcer Etiology: Wound Location: Left Toe Great - Lateral Wound Open Wounding Event: Gradually Appeared Status: Date Acquired: 07/17/2017 Comorbid Anemia, Arrhythmia, Congestive Heart Failure, Weeks Of Treatment: 11 History: Hypertension, Myocardial Infarction, Peripheral Clustered Wound: No Arterial Disease, Neuropathy Photos Photo Uploaded By: Curtis Sites on 10/05/2017 11:57:07 Wound Measurements Length: (cm) 1.4 Width: (cm) 0.6 Depth: (cm) 0.1 Area: (cm) 0.66 Volume: (cm) 0.066 % Reduction in Area: 78.8% % Reduction in Volume: 78.8% Epithelialization: None Tunneling: No Undermining: No Wound Description Classification: Partial Thickness Wound Margin: Flat and Intact Exudate Amount: Large Exudate Type: Serous Exudate Color: amber Foul Odor After Cleansing: No Slough/Fibrino Yes Wound Bed Granulation Amount: Medium (34-66%) Exposed Structure Granulation Quality: Pink Fascia Exposed: No Necrotic Amount: Medium (34-66%) Fat Layer (Subcutaneous Tissue) Exposed: No Necrotic Quality: Adherent Slough Tendon Exposed: No Muscle Exposed: No Joint Exposed: No Bone Exposed: No Periwound Skin Texture Hafley, Deshante G. ([id number]) Texture Color No Abnormalities Noted: No No Abnormalities Noted: No Callus: No Atrophie Blanche: No Crepitus: No Cyanosis: Yes Excoriation: No Ecchymosis: No Induration: No Erythema: No Rash: No Hemosiderin Staining: No Scarring: No Mottled: No Pallor: No Moisture Rubor: No No Abnormalities Noted: No Dry / Scaly: No Temperature / Pain Maceration: No Temperature: No Abnormality Tenderness on Palpation: Yes Wound Preparation Ulcer Cleansing: Rinsed/Irrigated with Saline Topical Anesthetic Applied: Other: lidocaine 4%, Treatment Notes Wound #6 (Left, Lateral Toe Great) 1. Cleansed with: Clean wound with Normal Saline 2. Anesthetic Topical Lidocaine 4% cream to  wound bed prior to debridement 4. Dressing Applied: Other dressing (specify in notes) 5. Secondary Dressing Applied Dry Gauze Kerlix/Conform Notes betadine iodine and sivercell Electronic Signature(s) Signed: 10/05/2017 5:03:40 PM By: Curtis Sites Entered By: Curtis Sites on 10/05/2017 10:06:00 Debellis, [name] ([id number]) -------------------------------------------------------------------------------- Wound Assessment Details Patient Name: [name] Date of Service: 10/05/2017 9:45 AM Medical Record Number: [id number] Patient Account Number: [id number] Date of Birth/Sex: [birth year] (71 y.o. F) Treating RN: Curtis Sites Primary Care Tylena Prisk: Quintin Alto Other Clinician: Referring Kinnick Maus: Quintin Alto Treating Farhad Burleson/Extender: Altamese [place] in Treatment: 16 Wound Status Wound Number: 7 Primary Arterial Insufficiency Ulcer Etiology: Wound Location: Left Toe Third Wound Open Wounding Event:  Gradually Appeared Status: Date Acquired: 07/16/2017 Comorbid Anemia, Arrhythmia, Congestive Heart Failure, Weeks Of Treatment: 11 History: Hypertension, Myocardial Infarction, Peripheral Clustered Wound: No Arterial Disease, Neuropathy Photos Photo Uploaded By: Curtis Sites on 10/05/2017 11:57:10 Wound Measurements Length: (cm) 1.5 Width: (cm) 1.5 Depth: (cm) 0.1 Area: (cm) 1.767 Volume: (cm) 0.177 % Reduction in Area: -134.4% % Reduction in Volume: -136% Epithelialization: None Tunneling: No Undermining: No Wound Description Classification: Partial Thickness Wound Margin: Flat and Intact Exudate Amount: Medium Exudate Type: Serous Exudate Color: amber Foul Odor After Cleansing: No Slough/Fibrino Yes Wound Bed Granulation Amount: None Present (0%) Exposed Structure Necrotic Amount: Large (67-100%) Fascia Exposed: No Necrotic Quality: Eschar Fat Layer (Subcutaneous Tissue) Exposed: No Tendon Exposed: No Muscle Exposed: No Joint  Exposed: No Bone Exposed: No Periwound Skin Texture Standish, Felicita G. ([id number]) Texture Color No Abnormalities Noted: No No Abnormalities Noted: No Callus: No Atrophie Blanche: No Crepitus: No Cyanosis: Yes Excoriation: No Ecchymosis: No Induration: No Erythema: No Rash: No Hemosiderin Staining: No Scarring: No Mottled: No Pallor: No Moisture Rubor: No No Abnormalities Noted: No Dry / Scaly: No Temperature / Pain Maceration: No Temperature: No Abnormality Tenderness on Palpation: Yes Wound Preparation Ulcer Cleansing: Rinsed/Irrigated with Saline Topical Anesthetic Applied: Other: lidocaine 4%, Treatment Notes Wound #7 (Left Toe Third) 1. Cleansed with: Clean wound with Normal Saline 2. Anesthetic Topical Lidocaine 4% cream to wound bed prior to debridement 4. Dressing Applied: Other dressing (specify in notes) 5. Secondary Dressing Applied Dry Gauze Kerlix/Conform Notes betadine iodine and sivercell Electronic Signature(s) Signed: 10/05/2017 5:03:40 PM By: Curtis Sites Entered By: Curtis Sites on 10/05/2017 10:07:10 Chiles, [name] ([id number]) -------------------------------------------------------------------------------- Vitals Details Patient Name: [name] Date of Service: 10/05/2017 9:45 AM Medical Record Number: [id number] Patient Account Number: [id number] Date of Birth/Sex: 02-Mar-1946 (72 y.o. F) Treating RN: Renne Crigler Primary Care Celinda Dethlefs: Quintin Alto Other Clinician: Referring Rushi Chasen: Quintin Alto Treating Dickson Kostelnik/Extender: Altamese [place] in Treatment: 16 Vital Signs Time Taken: 09:51 Temperature (F): 98.8 Height (in): 63 Pulse (bpm): 75 Weight (lbs): 176 Respiratory Rate (breaths/min): 18 Body Mass Index (BMI): 31.2 Blood Pressure (mmHg): 96/53 Reference Range: 80 - 120 mg / dl Electronic Signature(s) Signed: 10/05/2017 12:48:09 PM By: Renne Crigler Entered By: Renne Crigler on 10/05/2017  09:51:41

## 2017-10-07 NOTE — Progress Notes (Signed)
BERENISE, HUNTON ([id number]) Visit Report for 10/05/2017 HPI Details Patient Name: [name], [name] Date of Service: 10/05/2017 9:45 AM Medical Record Number: [id number] Patient Account Number: [id number] Date of Birth/Sex: 1946-04-09 (72 y.o. F) Treating RN: [name] Primary Care Provider: Quintin [name] Other Clinician: Referring Provider: Quintin [name] Treating Provider/Extender: [name] in Treatment: 16 History of Present Illness Location: right foot toes Quality: Patient reports experiencing a sharp pain to affected area(s). Severity: Patient states wound are getting worse. Duration: Patient has had the wound for < 2 weeks prior to presenting for treatment Timing: Pain in wound is constant (hurts all the time) Context: The wound appeared gradually over time Modifying Factors: Other treatment(s) tried include:recent interventional process to place a stent in her right lower extremity for critical limb ischemia Associated Signs and Symptoms: Patient reports having foul odor. HPI Description: 72 year old female who was referred by Dr. Nanetta [name], who performed a endovascular procedure on 05/26/2017, for critical limb ischemia. Her angiogram showed a 99% below the knee popliteal stenosis which is stented with a 4 mm drug eluting stent. On 06/10/2017 he noted that her pulses are palpable and her Dopplers have normalized. He referred her to wound care for further advice and put her on Keflex 500 mg twice daily for 10 days. She has a past medical history of asthma, coronary artery disease, CHF, hyperlipidemia, ischemic cardiomyopathy, peripheral arterial disease and type 2 diabetes mellitus. She is status post recent abdominal aortogram with right lower extremity stenting, cholecystectomy, CABG, parathyroidectomy, thyroidectomy. She is a former smoker and quit in 1988. She had an ABI performed on 06/10/2017 with the right ABI within normal range and the left ABI shows  mild lower extremity arterial disease. The right ABI was 1.0 and biphasic and the left ABI was 0.93 and biphasic. Toe pressures were abnormal with the right being 0.27 and left being 0.31. the duplex study done showed a patent right popliteal artery stent without evidence of focal stenosis or obstruction. of note the patient started having signs of critical limb ischemia somewhere at the end of November and after an urgent workup was taken up for limb salvage with a successfully placed stent by Dr. Nanetta [name], with good arterial perfusion postprocedure with normal ABIs noted on 06/10/2017. 06/22/17; patient was admitted to our clinic last week by Dr. Meyer [name].she is a type II diabetic. She had undergone a right popliteal DES on 11/29 for critical limb ischemia including erythema and pain of her right forefoot. She states about 3 days later she started developing ulcerations in predominantly the right foot but also the left third toe. 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 This is never really healed rib. Repeat ABIs on 12/14 showed a right ABI of 1 and a left ABI of 0.93 with toe pressures be markedly abnormal as noted above and Dr. Marcie [name] notes. The patient is still having a lot of pain making it very difficult for her to function at home this is worse on the right greater than left foot she does describe severe lancinating pain. She does not ambulate all that much and she finds it intolerable to wear footwear Her history is also notable for A. fib, severe cardiomyopathy with an ejection fraction of 25%_30%. Last echocardiogram in June of this year. According to Epic she is on a combination of Eliquis plavix and aspirin. 06/29/17; patient has an appointment with Dr. Imogene [name] of vascular surgery I believe  next week. This was arranged by her primary physician. She is still having a lot of pain in the right side in general most of her toes look better  however. Supposed to be using silver alginate however [facility] applying currently applied wet to dry dressings. 07/06/17; the patient went for appointment with Dr. Imogene [name]. She is status post right popliteal stenting with peroneal artery angioplasty with Dr. Gery [name]. She presented to our clinic with bilateral lower extremity wounds in her toes which are exceptionally painful. Dr. Imogene [name] put her on a course of antibiotics. Her arterial studies from 06/10/17 showed an ABI in the right of 1 and ABI in the left of 0.8. Waveforms at the dorsalis pedis and posterior tibial were biphasic. However routine ABIs were [name]. ([id number]) 0.31 bilaterally. The patient has severe bilateral rest pain. 07/20/17; the patient had her arteriogram by Dr. Imogene [name] on 07/14/17. Based on the images the patient was not felt to require any interventions and no further interventions were possible due to small size of the tibial arteries. The posterior tibial arteries bilaterally were occluded heel arteries were the dominant runoff which were miniscule. Anterior tibials both showed subtotal occlusion in the mid segment and these were small vessels. Paradoxically the patient complains of a lot less pain in the right foot however she is having a lot of pain in the left first and left third toes. She is managing this with activity limitation and narcotics. 07/27/17; I've spoken to Dr. Allyson [name] today and he is going to do another arteriogram on the left leg early next week with possibility of retrograde arterial intervention. The patient is still having a lot of pain in the left third toe and left first toe which is worse when she puts her leg up i.e. lies in bed etc. 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 She has done better on the right which is the side where she had the DES stent placed in the right popliteal. She still has a wound on the medial aspect of the third toe and black eschar at the tip of the fourth and  fifth toes however most of the rest of the right foot looks better. 08/10/17; the patient was taken for angiography on 08/01/17; she had a successful left peroneal PTA and drug eluding stent now has a widely patent peroneal supply collateral to the distal posterior tibial that feeds the dorsal pedal arch. The patient is on antiplatelet therapy. Unfortunately she has not had much relief of pain in the left great and third toe. She has had an increase in her hydrocodone by her primary physician. She had follow-up noninvasive Doppler test done yesterday although I cannot pull up these results 08/17/17; patient returns to clinic. She states she feels better. She's had an increase in her hydrocodone to 10/325 she is taking a half a tablet every 3 hours and occasionally a whole one at night which allows her to sleep through the night. She is not having pain in the right foot but is having pain in the left. She went to see Dr. Allyson [name] yesterday. He noted that he performed perineal interventions on 2//19 with 2 overlapping drug-eluting stents for perineal CTO. Her left posterior tibial is occluded as was her left anterior tibial at the level of the ankle. Recent Doppler showed patent peroneal stent. He takes it me yesterday to report the patient was in less pain which she seems to verify although this may be because of an increase  in strength and frequency of her narcotics culture the drainage I did last week showed both methicillin sensitive staph aureus and Pseudomonas. I sent in ciprofloxacin I believe for 10 days which should cover both of these. This may be partially why the pain in the right great toe is better 08/24/17; the patient is generally better. Especially the areas on the right foot where there are still some ischemic change on the right third and right fifth toes but her foot is warm here and I [name]'t think is contributing much to her pain. She still has considerable dry gangrene on the right great  toe with a open area medially. Also the left third toe. Things are still very tender here but considerably better. She doesn't have any purulent drainage or cellulitis and she is finished the antibiotics I gave her. She arrived in clinic with a low blood pressure she is having some chest pain 09/07/17; the patient continues to make decent improvement. She is in a lot less pain only using one half of a hydrocodone 10/325 3 times a day. There is really nothing open except for a small eschar on the tip of her right fourth toe. She still has thick eschar/dry gangrene on the left first and third toes although the pain here is a lot better as well. 10/05/17; we received a call from the patient's home health nurse concerned about erythema of the toes in the left foot requesting an antibiotic. I asked her to be seen by somebody in however she refused and came in for an appointment today. She is still having a fair amount of foot pain especially on the left she takes half and I'm in oxycodone 54 times a day. She tells me she is not eating well. She received 2 units of blood and has a GI follow-up for presumably GI-related anemia. She tells me she saw Dr. Gery [name] 2 weeks ago and he is going to follow-up with arterial Dopplers in May Electronic Signature(s) Signed: 10/05/2017 5:02:04 PM By: Baltazar Najjar MD Entered By: Baltazar Najjar on 10/05/2017 10:53:38 Bobo, [name] ([id number]) -------------------------------------------------------------------------------- Physical Exam Details Patient Name: [name] Date of Service: 10/05/2017 9:45 AM Medical Record Number: [id number] Patient Account Number: [id number] Date of Birth/Sex: Oct 31, 1945 (71 y.o. F) Treating RN: [name] Primary Care Provider: Quintin [name] Other Clinician: Referring Provider: Quintin [name] Treating Provider/Extender: [name] [place] in Treatment: 16 Constitutional Patient is hypotensive.. Pulse regular and  within target range for patient.Marland Kitchen Respirations regular, non-labored and within target range.. Temperature is normal and within the target range for the patient.Marland Kitchen appears in no distress. Eyes Conjunctivae clear. No discharge. Respiratory Respiratory effort is easy and symmetric bilaterally. Rate is normal at rest and on room air.. Cardiovascular femoral and popliteal pulses are palpable. Pedal pulses palpable and strong bilaterally.. Lymphatic none palpable in the popliteal or inguinal area. Psychiatric No evidence of depression, anxiety, or agitation. Calm, cooperative, and communicative. Appropriate interactions and affect.. Notes wound exam; the patient continues to have left foot dry gangrene at the tip of the left first toe and the tip of the left third toe. There was some separation of the eschar over the nailbed of the first toe that I removed it was just hanging off with my gloved hand however she found that very uncomfortable. More problematically on the left first toes on the plantar aspect there is marked tenderness. I suspect this is continued ischemia rather than infection although I will put her on antibiotics today  for this reason. The area on the medial first toe was about the same oOn the right foot she has slight amount of eschar on the right fourth toe tip and an open area on the right medial third toe Electronic Signature(s) Signed: 10/05/2017 5:02:04 PM By: Baltazar Najjar MD Entered By: Baltazar Najjar on 10/05/2017 10:56:00 [name], [name] ([id number]) -------------------------------------------------------------------------------- Physician Orders Details Patient Name: [name] Date of Service: 10/05/2017 9:45 AM Medical Record Number: [id number] Patient Account Number: [id number] Date of Birth/Sex: 13-May-1946 (71 y.o. F) Treating RN: [name] Primary Care Provider: Quintin [name] Other Clinician: Referring Provider: Quintin [name] Treating  Provider/Extender: [name] [place] in Treatment: 16 Verbal / Phone Orders: No Diagnosis Coding Wound Cleansing Wound #1 Right,Distal Toe - Web between 2nd and 3rd o Clean wound with Normal Saline. o May Shower, gently pat wound dry prior to applying new dressing. Wound #2 Right Toe - Web between 3rd and 4th o Clean wound with Normal Saline. o May Shower, gently pat wound dry prior to applying new dressing. Wound #4 Right,Dorsal Toe Fifth o Clean wound with Normal Saline. o May Shower, gently pat wound dry prior to applying new dressing. Wound #5 Left,Distal Toe Great o Clean wound with Normal Saline. o May Shower, gently pat wound dry prior to applying new dressing. Wound #6 Left,Lateral Toe Great o Clean wound with Normal Saline. o May Shower, gently pat wound dry prior to applying new dressing. Wound #7 Left Toe Third o Clean wound with Normal Saline. o May Shower, gently pat wound dry prior to applying new dressing. Anesthetic (add to Medication List) Wound #1 Right,Distal Toe - Web between 2nd and 3rd o Topical Lidocaine 4% cream applied to wound bed prior to debridement (In Clinic Only). Wound #2 Right Toe - Web between 3rd and 4th o Topical Lidocaine 4% cream applied to wound bed prior to debridement (In Clinic Only). Wound #4 Right,Dorsal Toe Fifth o Topical Lidocaine 4% cream applied to wound bed prior to debridement (In Clinic Only). Wound #5 Left,Distal Toe Great o Topical Lidocaine 4% cream applied to wound bed prior to debridement (In Clinic Only). Wound #6 Left,Lateral Toe Great o Topical Lidocaine 4% cream applied to wound bed prior to debridement (In Clinic Only). Wound #7 Left Toe Third o Topical Lidocaine 4% cream applied to wound bed prior to debridement (In Clinic Only). Skin Barriers/Peri-Wound Care [name], [name] ([id number]) Wound #5 Left,Distal Toe Great o Other: - Betadine Wound #7 Left Toe Third o  Other: - Betadine Primary Wound Dressing Wound #1 Right,Distal Toe - Web between 2nd and 3rd o Other: - betadine, silvercell toes separated Wound #2 Right Toe - Web between 3rd and 4th o Other: - betadine, silvercell toes separated Wound #4 Right,Dorsal Toe Fifth o Other: - betadine, silvercell toes separated Wound #5 Left,Distal Toe Great o Other: - betadine, silvercell toes separated Wound #6 Left,Lateral Toe Great o Other: - betadine, silvercell toes separated Wound #7 Left Toe Third o Other: - betadine, silvercell toes separated Secondary Dressing Wound #1 Right,Distal Toe - Web between 2nd and 3rd o ABD and Kerlix/Conform Wound #2 Right Toe - Web between 3rd and 4th o ABD and Kerlix/Conform Wound #4 Right,Dorsal Toe Fifth o ABD and Kerlix/Conform Wound #5 Left,Distal Toe Great o ABD and Kerlix/Conform Wound #6 Left,Lateral Toe Great o ABD and Kerlix/Conform Wound #7 Left Toe Third o ABD and Kerlix/Conform Dressing Change Frequency Wound #1 Right,Distal Toe - Web between 2nd and 3rd o Change  dressing every other day. Wound #2 Right Toe - Web between 3rd and 4th o Change dressing every other day. Wound #4 Right,Dorsal Toe Fifth o Change dressing every other day. Wound #5 Left,Distal Toe Great o Change dressing every other day. [name], [name] GMarland Kitchen ([id number]) Wound #6 Left,Lateral Toe Great o Change dressing every other day. Wound #7 Left Toe Third o Change dressing every other day. Follow-up Appointments Wound #1 Right,Distal Toe - Web between 2nd and 3rd o Return Appointment in 1 week. Wound #2 Right Toe - Web between 3rd and 4th o Return Appointment in 1 week. Wound #4 Right,Dorsal Toe Fifth o Return Appointment in 1 week. Wound #5 Left,Distal Toe Great o Return Appointment in 1 week. Wound #6 Left,Lateral Toe Great o Return Appointment in 1 week. Wound #7 Left Toe Third o Return Appointment in 1 week. Edema  Control Wound #1 Right,Distal Toe - Web between 2nd and 3rd o Elevate legs to the level of the heart and pump ankles as often as possible Wound #2 Right Toe - Web between 3rd and 4th o Elevate legs to the level of the heart and pump ankles as often as possible Wound #4 Right,Dorsal Toe Fifth o Elevate legs to the level of the heart and pump ankles as often as possible Wound #5 Left,Distal Toe Great o Elevate legs to the level of the heart and pump ankles as often as possible Wound #6 Left,Lateral Toe Great o Elevate legs to the level of the heart and pump ankles as often as possible Wound #7 Left Toe Third o Elevate legs to the level of the heart and pump ankles as often as possible Off-Loading Wound #1 Right,Distal Toe - Web between 2nd and 3rd o Open toe surgical shoe to: Wound #2 Right Toe - Web between 3rd and 4th o Open toe surgical shoe to: Wound #4 Right,Dorsal Toe Fifth o Open toe surgical shoe to: Wound #5 [address] Mckinze [name]. ([id number]) o Open toe surgical shoe to: Wound #6 Left,Lateral Toe Great o Open toe surgical shoe to: Wound #7 Left Toe Third o Open toe surgical shoe to: Additional Orders / Instructions Wound #1 Right,Distal Toe - Web between 2nd and 3rd o Increase protein intake. Wound #2 Right Toe - Web between 3rd and 4th o Increase protein intake. Wound #4 Right,Dorsal Toe Fifth o Increase protein intake. Wound #5 Left,Distal Toe Great o Increase protein intake. Wound #6 Left,Lateral Toe Great o Increase protein intake. Wound #7 Left Toe Third o Increase protein intake. Home Health Wound #1 Right,Distal Toe - Web between 2nd and 3rd o Continue Home Health Visits - [facility] o Home Health Nurse may visit PRN to address patientos wound care needs. o FACE TO FACE ENCOUNTER: MEDICARE and MEDICAID PATIENTS: I certify that this patient is under my care and that I had a face-to-face encounter that  meets the physician face-to-face encounter requirements with this patient on this date. The encounter with the patient was in whole or in part for the following MEDICAL CONDITION: (primary reason for Home Healthcare) MEDICAL NECESSITY: I certify, that based on my findings, NURSING services are a medically necessary home health service. HOME BOUND STATUS: I certify that my clinical findings support that this patient is homebound (i.e., Due to illness or injury, pt requires aid of supportive devices such as crutches, cane, wheelchairs, walkers, the use of special transportation or the assistance of another person to leave their place of residence. There is a normal inability to  leave the home and doing so requires considerable and taxing effort. Other absences are for medical reasons / religious services and are infrequent or of short duration when for other reasons). o If current dressing causes regression in wound condition, may D/C ordered dressing product/s and apply Normal Saline Moist Dressing daily until next Wound Healing Center / Other MD appointment. Notify Wound Healing Center of regression in wound condition at [phone]. o Please direct any NON-WOUND related issues/requests for orders to patient's Primary Care Physician Wound #2 Right Toe - Web between 3rd and 4th o Continue Home Health Visits - [facility] o Home Health Nurse may visit PRN to address patientos wound care needs. o FACE TO FACE ENCOUNTER: MEDICARE and MEDICAID PATIENTS: I certify that this patient is under my care and that I had a face-to-face encounter that meets the physician face-to-face encounter requirements with this patient on this date. The encounter with the patient was in whole or in part for the following MEDICAL CONDITION: (primary reason for Home Healthcare) MEDICAL NECESSITY: I certify, that based on my findings, NURSING services are a medically necessary home health service. HOME BOUND STATUS: I  certify that my clinical findings support that this patient is homebound (i.e., Due to illness or injury, pt requires aid of supportive devices such as crutches, cane, wheelchairs, walkers, the use of special transportation or the assistance of another person to leave their place of residence. There is a normal inability to leave the home [name], [name]. ([id number]) and doing so requires considerable and taxing effort. Other absences are for medical reasons / religious services and are infrequent or of short duration when for other reasons). o If current dressing causes regression in wound condition, may D/C ordered dressing product/s and apply Normal Saline Moist Dressing daily until next Wound Healing Center / Other MD appointment. Notify Wound Healing Center of regression in wound condition at [phone]. o Please direct any NON-WOUND related issues/requests for orders to patient's Primary Care Physician Wound #4 Right,Dorsal Toe Fifth o Continue Home Health Visits - [facility] o Home Health Nurse may visit PRN to address patientos wound care needs. o FACE TO FACE ENCOUNTER: MEDICARE and MEDICAID PATIENTS: I certify that this patient is under my care and that I had a face-to-face encounter that meets the physician face-to-face encounter requirements with this patient on this date. The encounter with the patient was in whole or in part for the following MEDICAL CONDITION: (primary reason for Home Healthcare) MEDICAL NECESSITY: I certify, that based on my findings, NURSING services are a medically necessary home health service. HOME BOUND STATUS: I certify that my clinical findings support that this patient is homebound (i.e., Due to illness or injury, pt requires aid of supportive devices such as crutches, cane, wheelchairs, walkers, the use of special transportation or the assistance of another person to leave their place of residence. There is a normal inability to leave the  home and doing so requires considerable and taxing effort. Other absences are for medical reasons / religious services and are infrequent or of short duration when for other reasons). o If current dressing causes regression in wound condition, may D/C ordered dressing product/s and apply Normal Saline Moist Dressing daily until next Wound Healing Center / Other MD appointment. Notify Wound Healing Center of regression in wound condition at [phone]. o Please direct any NON-WOUND related issues/requests for orders to patient's Primary Care Physician Wound #5 Left,Distal Toe Doretha Sou Continue Home Health Visits - [facility] o Home Health  Nurse may visit PRN to address patientos wound care needs. o FACE TO FACE ENCOUNTER: MEDICARE and MEDICAID PATIENTS: I certify that this patient is under my care and that I had a face-to-face encounter that meets the physician face-to-face encounter requirements with this patient on this date. The encounter with the patient was in whole or in part for the following MEDICAL CONDITION: (primary reason for Home Healthcare) MEDICAL NECESSITY: I certify, that based on my findings, NURSING services are a medically necessary home health service. HOME BOUND STATUS: I certify that my clinical findings support that this patient is homebound (i.e., Due to illness or injury, pt requires aid of supportive devices such as crutches, cane, wheelchairs, walkers, the use of special transportation or the assistance of another person to leave their place of residence. There is a normal inability to leave the home and doing so requires considerable and taxing effort. Other absences are for medical reasons / religious services and are infrequent or of short duration when for other reasons). o If current dressing causes regression in wound condition, may D/C ordered dressing product/s and apply Normal Saline Moist Dressing daily until next Wound Healing Center / Other MD  appointment. Notify Wound Healing Center of regression in wound condition at [phone]. o Please direct any NON-WOUND related issues/requests for orders to patient's Primary Care Physician Wound #6 Left,Lateral Toe Doretha Sou Continue Home Health Visits - [facility] o Home Health Nurse may visit PRN to address patientos wound care needs. o FACE TO FACE ENCOUNTER: MEDICARE and MEDICAID PATIENTS: I certify that this patient is under my care and that I had a face-to-face encounter that meets the physician face-to-face encounter requirements with this patient on this date. The encounter with the patient was in whole or in part for the following MEDICAL CONDITION: (primary reason for Home Healthcare) MEDICAL NECESSITY: I certify, that based on my findings, NURSING services are a medically necessary home health service. HOME BOUND STATUS: I certify that my clinical findings support that this patient is homebound (i.e., Due to illness or injury, pt requires aid of supportive devices such as crutches, cane, wheelchairs, walkers, the use of special transportation or the assistance of another person to leave their place of residence. There is a normal inability to leave the home and doing so requires considerable and taxing effort. Other absences are for medical reasons / religious services and are infrequent or of short duration when for other reasons). o If current dressing causes regression in wound condition, may D/C ordered dressing product/s and apply Normal Saline Moist Dressing daily until next Wound Healing Center / Other MD appointment. Notify Wound Healing Center of regression in wound condition at [phone]. [name], [name] ([id number]) o Please direct any NON-WOUND related issues/requests for orders to patient's Primary Care Physician Wound #7 Left Toe Third o Continue Home Health Visits - Advanced o Home Health Nurse may visit PRN to address patientos wound care  needs. o FACE TO FACE ENCOUNTER: MEDICARE and MEDICAID PATIENTS: I certify that this patient is under my care and that I had a face-to-face encounter that meets the physician face-to-face encounter requirements with this patient on this date. The encounter with the patient was in whole or in part for the following MEDICAL CONDITION: (primary reason for Home Healthcare) MEDICAL NECESSITY: I certify, that based on my findings, NURSING services are a medically necessary home health service. HOME BOUND STATUS: I certify that my clinical findings support that this patient is homebound (i.e., Due to illness  or injury, pt requires aid of supportive devices such as crutches, cane, wheelchairs, walkers, the use of special transportation or the assistance of another person to leave their place of residence. There is a normal inability to leave the home and doing so requires considerable and taxing effort. Other absences are for medical reasons / religious services and are infrequent or of short duration when for other reasons). o If current dressing causes regression in wound condition, may D/C ordered dressing product/s and apply Normal Saline Moist Dressing daily until next Wound Healing Center / Other MD appointment. Notify Wound Healing Center of regression in wound condition at [phone]. o Please direct any NON-WOUND related issues/requests for orders to patient's Primary Care Physician Medications-please add to medication list. Wound #5 Left,Distal Toe Great o P.O. Antibiotics - Augmentin Wound #7 Left Toe Third o P.O. Antibiotics - Augmentin Patient Medications Allergies: oxycodone, Ranexa, Statins-Hmg-Coa Reductase Inhibitors Notifications Medication Indication Start End Augmentin cellulitis left great 10/05/2017 toe DOSE oral 875 mg-125 mg tablet - 1 tablet oral bid for 7 days Electronic Signature(s) Signed: 10/05/2017 10:59:08 AM By: Baltazar Najjar MD Entered By: Baltazar Najjar on 10/05/2017 10:59:06 [place], [name] ([id number]) -------------------------------------------------------------------------------- Problem List Details Patient Name: [name] Date of Service: 10/05/2017 9:45 AM Medical Record Number: [id number] Patient Account Number: [id number] Date of Birth/Sex: 11-20-1945 (71 y.o. 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 F) Treating RN: [name] Primary Care Provider: Quintin [name] Other Clinician: Referring Provider: Quintin [name] Treating Provider/Extender: [name] Litchfield in Treatment: 16 Subjective History of Present Illness (HPI) The following HPI elements were documented for the patient's wound: Location: right foot toes Quality: Patient reports experiencing a sharp pain to affected area(s). Severity: Patient states wound are getting worse. Duration: Patient has had the wound for <  2 weeks prior to presenting for treatment Timing: Pain in wound is constant (hurts all the time) Context: The wound appeared gradually over time Modifying Factors: Other treatment(s) tried include:recent interventional process to place a stent in her right lower extremity for critical limb ischemia Associated Signs and Symptoms: Patient reports having foul odor. 72 year old female who was referred by Dr. Nanetta [name], who performed a endovascular procedure on 05/26/2017, for critical limb ischemia. Her angiogram showed a 99% below the knee popliteal stenosis which is stented with a 4 mm drug eluting stent. On 06/10/2017 he noted that her pulses are palpable and her Dopplers have normalized. He referred her to wound care for further advice and put her on Keflex 500 mg twice daily for 10 days. She has a past medical history of asthma, coronary artery disease, CHF, hyperlipidemia, ischemic cardiomyopathy, peripheral arterial disease and type 2 diabetes mellitus. She is status post recent abdominal  aortogram with right lower extremity stenting, cholecystectomy, CABG, parathyroidectomy, thyroidectomy. She is a former smoker and quit in 1988. 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 The right ABI was 1.0 and biphasic and the left ABI was 0.93 and biphasic. Toe pressures were abnormal with the right being 0.27 and left being 0.31. the duplex study done showed a patent right popliteal artery stent without evidence of focal stenosis or obstruction. of note the patient started having signs of critical limb ischemia somewhere at the end of November and after an urgent workup was taken up for limb salvage with a successfully placed stent by Dr. Nanetta [name], with good arterial perfusion postprocedure with normal ABIs noted on 06/10/2017. 06/22/17; patient was admitted to our clinic last week by Dr.  Meyer [name].she is a type II diabetic. She had undergone a right popliteal DES on 11/29 for critical limb ischemia including erythema and pain of her right forefoot. She states about 3 days later she started developing ulcerations in predominantly the right foot but also the left third toe. 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 This is never really healed rib. Repeat ABIs on 12/14 showed a right ABI of 1 and a left ABI of 0.93 with toe pressures be markedly abnormal as noted above and Dr. Marcie [name] notes. The patient is still having a lot of pain making it very difficult for her to function at home this is worse on the right greater than left foot she does describe severe lancinating pain. She does not ambulate all that much and she finds it intolerable to wear footwear Her history is also notable for A. fib, severe cardiomyopathy with an ejection fraction of 25%_30%. Last echocardiogram in June of this year. According to Epic she is on a combination of Eliquis plavix and aspirin. 06/29/17; patient has an appointment with Dr. Imogene [name] of vascular surgery I believe next week. This was arranged by her primary physician. She is still having a lot of pain in the right side in general most of her toes look better however. Supposed to be using silver alginate however [facility] applying currently applied wet to dry dressings. 07/06/17; the patient went for appointment with Dr. Imogene [name]. She is status post right popliteal stenting with peroneal artery angioplasty with Dr. Gery [name]. She presented to our clinic with bilateral lower extremity wounds in her toes which are exceptionally painful. Dr. Imogene [name] put her on a course of antibiotics. Her arterial studies from 06/10/17 showed an ABI in the right of 1 and ABI in the left of 0.8. Waveforms at the dorsalis pedis and posterior tibial were biphasic. However routine ABIs were Benkert, Takelia [name]. ([id number]) 0.31 bilaterally.  The patient has severe bilateral rest pain. 07/20/17; the patient had her arteriogram by Dr. Imogene [name] on 07/14/17. Based on the images the patient was not felt to require any interventions and no further interventions were possible due to small size of the tibial arteries. The posterior tibial arteries bilaterally were occluded heel arteries were the dominant runoff which were miniscule. Anterior tibials both showed subtotal occlusion in the mid segment and these were small vessels. Paradoxically the patient complains of a lot less pain in the right foot however she is having a lot of pain in the left first and left third toes. She is managing this with activity limitation and narcotics. 07/27/17; I've spoken  to Dr. Allyson [name] today and he is going to do another arteriogram on the left leg early next week with possibility of retrograde arterial intervention. The patient is still having a lot of pain in the left third toe and left first toe which is worse when she puts her leg up i.e. lies in bed etc. 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 She has done better on the right which is the side where she had the DES stent placed in the right popliteal. She still has a wound on the medial aspect of the third toe and black eschar at the tip of the fourth and fifth toes however most of the rest of the right foot looks better. 08/10/17; the patient was taken for angiography on 08/01/17; she had a successful left peroneal PTA and drug eluding stent now has a widely patent peroneal supply collateral to the distal posterior tibial that feeds the dorsal pedal arch. The patient is on antiplatelet therapy. Unfortunately she has not had much relief of pain in the left great and third toe. She has had an increase in her hydrocodone by her primary physician. She had follow-up noninvasive Doppler test done yesterday although I cannot pull up these results 08/17/17; patient returns to clinic. She states she feels better. She's  had an increase in her hydrocodone to 10/325 she is taking a half a tablet every 3 hours and occasionally a whole one at night which allows her to sleep through the night. She is not having pain in the right foot but is having pain in the left. She went to see Dr. Allyson [name] yesterday. He noted that he performed perineal interventions on 2//19 with 2 overlapping drug-eluting stents for perineal CTO. Her left posterior tibial is occluded as was her left anterior tibial at the level of the ankle. Recent Doppler showed patent peroneal stent. He takes it me yesterday to report the patient was in less pain which she seems to verify although this may be because of an increase in strength and frequency of her narcotics culture the drainage I did last week showed both methicillin sensitive staph aureus and Pseudomonas. I sent in ciprofloxacin I believe for 10 days which should cover both of these. This may be partially why the pain in the right great toe is better 08/24/17; the patient is generally better. Especially the areas on the right foot where there are still some ischemic change on the right third and right fifth toes but her foot is warm here and I [name]'t think is contributing much to her pain. She still has considerable dry gangrene on the right great toe with a open area medially. Also the left third toe. Things are still very tender here but considerably better. She doesn't have any purulent drainage or cellulitis and she is finished the antibiotics I gave her. She arrived in clinic with a low blood pressure she is having some chest pain 09/07/17; the patient continues to make decent improvement. She is in a lot less pain only using one half of a hydrocodone 10/325 3 times a day. There is really nothing open except for a small eschar on the tip of her right fourth toe. She still has thick eschar/dry gangrene on the left first and third toes although the pain here is a lot better as well. 10/05/17; we  received a call from the patient's home health nurse concerned about erythema of the toes in the left foot requesting an antibiotic. I asked her  to be seen by somebody in however she refused and came in for an appointment today. She is still having a fair amount of foot pain especially on the left she takes half and I'm in oxycodone 54 times a day. She tells me she is not eating well. She received 2 units of blood and has a GI follow-up for presumably GI-related anemia. She tells me she saw Dr. Gery [name] 2 weeks ago and he is going to follow-up with arterial Dopplers in May Objective Constitutional Patient is hypotensive.. Pulse regular and within target range for patient.Marland Kitchen Respirations regular, non-labored and within target range.. Temperature is normal and within the target range for the patient.Marland Kitchen appears in no distress. Vitals Time Taken: 9:51 AM, Height: 63 in, Weight: 176 lbs, BMI: 31.2, Temperature: 98.8 F, Pulse: 75 bpm, Respiratory Rate: 18 breaths/min, Blood Pressure: 96/53 mmHg. [name], [name] GMarland Kitchen ([id number]) Eyes Conjunctivae clear. No discharge. Respiratory Respiratory effort is easy and symmetric bilaterally. Rate is normal at rest and on room air.. Cardiovascular femoral and popliteal pulses are palpable. Pedal pulses palpable and strong bilaterally.. Lymphatic none palpable in the popliteal or inguinal area. Psychiatric No evidence of depression, anxiety, or agitation. Calm, cooperative, and communicative. Appropriate interactions and affect.. General Notes: wound exam; the patient continues to have left foot dry gangrene at the tip of the left first toe and the tip of the left third toe. There was some separation of the eschar over the nailbed of the first toe that I removed it was just hanging off with my gloved hand however she found that very uncomfortable. More problematically on the left first toes on the plantar aspect there is marked tenderness. I suspect this is continued  ischemia rather than infection although I will put her on antibiotics today for this reason. The area on the medial first toe was about the same On the right foot she has slight amount of eschar on the right fourth toe tip and an open area on the right medial third toe Integumentary (Hair, Skin) Wound #1 status is Open. Original cause of wound was Gradually Appeared. The wound is located on the Right,Distal Toe - Web between 2nd and 3rd. The wound measures 0.8cm length x 0.5cm width x 0.1cm depth; 0.314cm^2 area and 0.031cm^3 volume. There is no tunneling or undermining noted. There is a large amount of serous drainage noted. The wound margin is flat and intact. There is large (67-100%) red granulation within the wound bed. There is no necrotic tissue within the wound bed. The periwound skin appearance did not exhibit: Callus, Crepitus, Excoriation, Induration, Rash, Scarring, Dry/Scaly, Maceration, Atrophie Blanche, Cyanosis, Ecchymosis, Hemosiderin Staining, Mottled, Pallor, Rubor, Erythema. Periwound temperature was noted as No Abnormality. The periwound has tenderness on palpation. Wound #2 status is Open. Original cause of wound was Pressure Injury. The wound is located on the Right Toe - Web between 3rd and 4th. The wound measures 0.1cm length x 0.1cm width x 0.1cm depth; 0.008cm^2 area and 0.001cm^3 volume. There is no tunneling or undermining noted. There is a large amount of serous drainage noted. The wound margin is flat and intact. There is large (67-100%) red granulation within the wound bed. There is no necrotic tissue within the wound bed. The periwound skin appearance exhibited: Cyanosis. The periwound skin appearance did not exhibit: Callus, Crepitus, Excoriation, Induration, Rash, Scarring, Dry/Scaly, Maceration, Atrophie Blanche, Ecchymosis, Hemosiderin Staining, Mottled, Pallor, Rubor, Erythema. Periwound temperature was noted as No Abnormality. The periwound has tenderness  on palpation. Wound #4  status is Open. Original cause of wound was Gradually Appeared. The wound is located on the Right,Dorsal Toe Fifth. The wound measures 0.1cm length x 0.1cm width x 0.1cm depth; 0.008cm^2 area and 0.001cm^3 volume. There is no tunneling or undermining noted. There is a none present amount of drainage noted. The wound margin is flat and intact. There is no granulation within the wound bed. There is a large (67-100%) amount of necrotic tissue within the wound bed including Eschar. The periwound skin appearance exhibited: Cyanosis. The periwound skin appearance did not exhibit: Callus, Crepitus, Excoriation, Induration, Rash, Scarring, Dry/Scaly, Maceration, Atrophie Blanche, Ecchymosis, Hemosiderin Staining, Mottled, Pallor, Rubor, Erythema. Periwound temperature was noted as No Abnormality. The periwound has tenderness on palpation. Wound #5 status is Open. Original cause of wound was Gradually Appeared. The wound is located on the [employer]. The wound measures 2.3cm length x 2.2cm width x 0.1cm depth; 3.974cm^2 area and 0.397cm^3 volume. There is no tunneling or undermining noted. There is a medium amount of serous drainage noted. The wound margin is flat and intact. There is no granulation within the wound bed. There is a large (67-100%) amount of necrotic tissue within the wound bed including Eschar and Adherent Slough. The periwound skin appearance exhibited: Cyanosis. The periwound skin appearance did not exhibit: Callus, Crepitus, Excoriation, Induration, Rash, Scarring, Dry/Scaly, Maceration, Atrophie Blanche, Ecchymosis, Hemosiderin Staining, Mottled, Pallor, Rubor, Erythema. Periwound temperature was noted as No Abnormality. The periwound has tenderness on palpation. Wound #6 status is Open. Original cause of wound was Gradually Appeared. The wound is located on the [employer], [place] [name]. ([id number]) Great. The wound measures 1.4cm length x  0.6cm width x 0.1cm depth; 0.66cm^2 area and 0.066cm^3 volume. There is no tunneling or undermining noted. There is a large amount of serous drainage noted. The wound margin is flat and intact. There is medium (34-66%) pink granulation within the wound bed. There is a medium (34-66%) amount of necrotic tissue within the wound bed including Adherent Slough. The periwound skin appearance exhibited: Cyanosis. The periwound skin appearance did not exhibit: Callus, Crepitus, Excoriation, Induration, Rash, Scarring, Dry/Scaly, Maceration, Atrophie Blanche, Ecchymosis, Hemosiderin Staining, Mottled, Pallor, Rubor, Erythema. Periwound temperature was noted as No Abnormality. The periwound has tenderness on palpation. Wound #7 status is Open. Original cause of wound was Gradually Appeared. The wound is located on the Left Toe Third. The wound measures 1.5cm length x 1.5cm width x 0.1cm depth; 1.767cm^2 area and 0.177cm^3 volume. There is no tunneling or undermining noted. There is a medium amount of serous drainage noted. The wound margin is flat and intact. There is no granulation within the wound bed. There is a large (67-100%) amount of necrotic tissue within the wound bed including Eschar. The periwound skin appearance exhibited: Cyanosis. The periwound skin appearance did not exhibit: Callus, Crepitus, Excoriation, Induration, Rash, Scarring, Dry/Scaly, Maceration, Atrophie Blanche, Ecchymosis, Hemosiderin Staining, Mottled, Pallor, Rubor, Erythema. Periwound temperature was noted as No Abnormality. The periwound has tenderness on palpation. 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 May Shower, gently pat wound dry prior to applying new dressing. Wound #2 Right Toe - Web between 3rd  and 4th: Clean wound with Normal Saline. May Shower, gently pat wound dry prior to applying new dressing. Wound #4 Right,Dorsal Toe Fifth: Clean wound with Normal Saline. May Shower, gently pat wound dry prior to applying new dressing. Wound #5 Left,Distal Toe Great: Clean wound with Normal Saline. May Shower, gently pat wound dry prior to applying new dressing. Wound #6 Left,Lateral Toe Great: Clean wound with Normal Saline. May Shower, gently pat wound dry prior to applying new dressing. Wound #7 Left Toe Third: Clean wound with Normal Saline. May Shower, gently pat wound dry prior to applying new dressing. Anesthetic (add to Medication List): Wound #1 Right,Distal Toe - Web between 2nd and 3rd: Topical Lidocaine 4% cream applied to wound bed prior to debridement (In Clinic Only). [name], [name] ([id number]) Wound #2 Right Toe - Web between 3rd and 4th: Topical Lidocaine 4% cream applied to wound bed prior to debridement (In Clinic Only). Wound #4 Right,Dorsal Toe Fifth: Topical Lidocaine 4% cream applied to wound bed prior to debridement (In Clinic Only). Wound #5 Left,Distal Toe Great: Topical Lidocaine 4% cream applied to wound bed prior to debridement (In Clinic Only). Wound #6 Left,Lateral Toe Great: Topical Lidocaine 4% cream applied to wound bed prior to debridement (In Clinic Only). Wound #7 Left Toe Third: Topical Lidocaine 4% cream applied to wound bed prior to debridement (In Clinic Only). Skin Barriers/Peri-Wound Care: Wound #5 Left,Distal Toe Great: Other: - Betadine Wound #7 Left Toe Third: Other: - Betadine Primary Wound Dressing: Wound #1 Right,Distal Toe - Web between 2nd and 3rd: Other: - betadine, silvercell toes separated Wound #2 Right Toe - Web between 3rd and 4th: Other: -  betadine, silvercell toes separated Wound #4 Right,Dorsal Toe Fifth: Other: - betadine, silvercell toes separated Wound #5 Left,Distal Toe Great: Other: - betadine, silvercell toes separated Wound #6 Left,Lateral Toe Great: Other: - betadine, silvercell toes separated Wound #7 Left Toe Third: Other: - betadine, silvercell toes separated Secondary Dressing: Wound #1 Right,Distal Toe - Web between 2nd and 3rd: ABD and Kerlix/Conform Wound #2 Right Toe - Web between 3rd and 4th: ABD and Kerlix/Conform Wound #4 Right,Dorsal Toe Fifth: ABD and Kerlix/Conform Wound #5 Left,Distal Toe Great: ABD and Kerlix/Conform Wound #6 Left,Lateral Toe Great: ABD and Kerlix/Conform Wound #7 Left Toe Third: ABD and Kerlix/Conform Dressing Change Frequency: Wound #1 Right,Distal Toe - Web between 2nd and 3rd: Change dressing every other day. Wound #2 Right Toe - Web between 3rd and 4th: Change dressing every other day. Wound #4 Right,Dorsal Toe Fifth: Change dressing every other day. Wound #5 Left,Distal Toe Great: Change dressing every other day. Wound #6 Left,Lateral Toe Great: Change dressing every other day. Wound #7 Left Toe Third: Change dressing every other day. Follow-up Appointments: Wound #1 Right,Distal Toe - Web between 2nd and 3rd: Return Appointment in 1 week. Wound #2 Right Toe - Web between 3rd and 4th: Return Appointment in 1 week. Wound #4 Right,Dorsal Toe Fifth: [place], Tyler [name]. ([id number]) Return Appointment in 1 week. Wound #5 Left,Distal Toe Great: Return Appointment in 1 week. Wound #6 Left,Lateral Toe Great: Return Appointment in 1 week. Wound #7 Left Toe Third: Return Appointment in 1 week. Edema Control: Wound #1 Right,Distal Toe - Web between 2nd and 3rd: Elevate legs to the level of the heart and pump ankles as often as possible Wound #2 Right Toe - Web between 3rd and 4th: Elevate legs to the level of the heart and pump ankles as often as possible Wound #4  Right,Dorsal Toe  Fifth: Elevate legs to the level of the heart and pump ankles as often as possible Wound #5 Left,Distal Toe Great: Elevate legs to the level of the heart and pump ankles as often as possible Wound #6 Left,Lateral Toe Great: Elevate legs to the level of the heart and pump ankles as often as possible Wound #7 Left Toe Third: Elevate legs to the level of the heart and pump ankles as often as possible Off-Loading: Wound #1 Right,Distal Toe - Web between 2nd and 3rd: Open toe surgical shoe to: Wound #2 Right Toe - Web between 3rd and 4th: Open toe surgical shoe to: Wound #4 Right,Dorsal Toe Fifth: Open toe surgical shoe to: Wound #5 Left,Distal Toe Great: Open toe surgical shoe to: Wound #6 Left,Lateral Toe Great: Open toe surgical shoe to: Wound #7 Left Toe Third: Open toe surgical shoe to: Additional Orders / Instructions: Wound #1 Right,Distal Toe - Web between 2nd and 3rd: Increase protein intake. Wound #2 Right Toe - Web between 3rd and 4th: Increase protein intake. Wound #4 Right,Dorsal Toe Fifth: Increase protein intake. Wound #5 Left,Distal Toe Great: Increase protein intake. Wound #6 Left,Lateral Toe Great: Increase protein intake. Wound #7 Left Toe Third: Increase protein intake. Home Health: Wound #1 Right,Distal Toe - Web between 2nd and 3rd: Continue Home Health Visits - [facility] may visit PRN to address patient s wound care needs. FACE TO FACE ENCOUNTER: MEDICARE and MEDICAID PATIENTS: I certify that this patient is under my care and that I had a face-to-face encounter that meets the physician face-to-face encounter requirements with this patient on this date. The encounter with the patient was in whole or in part for the following MEDICAL CONDITION: (primary reason for Home Healthcare) MEDICAL NECESSITY: I certify, that based on my findings, NURSING services are a medically necessary home health service. HOME BOUND STATUS: I  certify that my clinical findings support that this patient is homebound (i.e., Due to illness or injury, pt requires aid of supportive devices such as crutches, cane, wheelchairs, walkers, the use of special transportation or the assistance of another person to leave their place of residence. There is a normal inability to leave the home and doing so requires considerable and taxing effort. Other absences are for medical reasons / religious services and are infrequent or of short duration when for other reasons). If current dressing causes regression in wound condition, may D/C ordered dressing product/s and apply Normal Saline [name], [name]. ([id number]) Moist Dressing daily until next Wound Healing Center / Other MD appointment. Notify Wound Healing Center of regression in wound condition at [phone]. Please direct any NON-WOUND related issues/requests for orders to patient's Primary Care Physician Wound #2 Right Toe - Web between 3rd and 4th: Continue Home Health Visits - [facility] may visit PRN to address patient s wound care needs. FACE TO FACE ENCOUNTER: MEDICARE and MEDICAID PATIENTS: I certify that this patient is under my care and that I had a face-to-face encounter that meets the physician face-to-face encounter requirements with this patient on this date. The encounter with the patient was in whole or in part for the following MEDICAL CONDITION: (primary reason for Home Healthcare) MEDICAL NECESSITY: I certify, that based on my findings, NURSING services are a medically necessary home health service. HOME BOUND STATUS: I certify that my clinical findings support that this patient is homebound (i.e., Due to illness or injury, pt requires aid of supportive devices such as crutches, cane, wheelchairs, walkers, the  use of special transportation or the assistance of another person to leave their place of residence. There is a normal inability to leave the home and  doing so requires considerable and taxing effort. Other absences are for medical reasons / religious services and are infrequent or of short duration when for other reasons). If current dressing causes regression in wound condition, may D/C ordered dressing product/s and apply Normal Saline Moist Dressing daily until next Wound Healing Center / Other MD appointment. Notify Wound Healing Center of regression in wound condition at [phone]. Please direct any NON-WOUND related issues/requests for orders to patient's Primary Care Physician Wound #4 Right,Dorsal Toe Fifth: Continue Home Health Visits - [facility] may visit PRN to address patient s wound care needs. FACE TO FACE ENCOUNTER: MEDICARE and MEDICAID PATIENTS: I certify that this patient is under my care and that I had a face-to-face encounter that meets the physician face-to-face encounter requirements with this patient on this date. The encounter with the patient was in whole or in part for the following MEDICAL CONDITION: (primary reason for Home Healthcare) MEDICAL NECESSITY: I certify, that based on my findings, NURSING services are a medically necessary home health service. HOME BOUND STATUS: I certify that my clinical findings support that this patient is homebound (i.e., Due to illness or injury, pt requires aid of supportive devices such as crutches, cane, wheelchairs, walkers, the use of special transportation or the assistance of another person to leave their place of residence. There is a normal inability to leave the home and doing so requires considerable and taxing effort. Other absences are for medical reasons / religious services and are infrequent or of short duration when for other reasons). If current dressing causes regression in wound condition, may D/C ordered dressing product/s and apply Normal Saline Moist Dressing daily until next Wound Healing Center / Other MD appointment. Notify Wound Healing  Center of regression in wound condition at [phone]. Please direct any NON-WOUND related issues/requests for orders to patient's Primary Care Physician Wound #5 Left,Distal Toe Great: Continue Home [facility] Visits - [facility] may visit PRN to address patient s wound care needs. FACE TO FACE ENCOUNTER: MEDICARE and MEDICAID PATIENTS: I certify that this patient is under my care and that I had a face-to-face encounter that meets the physician face-to-face encounter requirements with this patient on this date. The encounter with the patient was in whole or in part for the following MEDICAL CONDITION: (primary reason for Home Healthcare) MEDICAL NECESSITY: I certify, that based on my findings, NURSING services are a medically necessary home health service. HOME BOUND STATUS: I certify that my clinical findings support that this patient is homebound (i.e., Due to illness or injury, pt requires aid of supportive devices such as crutches, cane, wheelchairs, walkers, the use of special transportation or the assistance of another person to leave their place of residence. There is a normal inability to leave the home and doing so requires considerable and taxing effort. Other absences are for medical reasons / religious services and are infrequent or of short duration when for other reasons). If current dressing causes regression in wound condition, may D/C ordered dressing product/s and apply Normal Saline Moist Dressing daily until next Wound Healing Center / Other MD appointment. Notify Wound Healing Center of regression in wound condition at [phone]. Please direct any NON-WOUND related issues/requests for orders to patient's Primary Care Physician Wound #6 Left,Lateral Toe Great: Continue [facility] Visits - [facility] Nurse may visit PRN to address patient s wound care needs. FACE TO FACE ENCOUNTER: MEDICARE and MEDICAID PATIENTS: I certify that this patient is  under my care and that I had a face-to-face encounter that meets the physician face-to-face encounter requirements with this patient on this date. The encounter with the patient was in whole or in part for the following MEDICAL CONDITION: (primary reason for Home Healthcare) MEDICAL NECESSITY: I certify, that based on my findings, NURSING services are a medically necessary home health service. HOME BOUND STATUS: I certify that my clinical findings support that this patient is homebound (i.e., Due to illness or injury, pt requires aid of supportive devices such as crutches, cane, wheelchairs, walkers, the use of special [name], [name]. ([id number]) transportation or the assistance of another person to leave their place of residence. There is a normal inability to leave the home and doing so requires considerable and taxing effort. Other absences are for medical reasons / religious services and are infrequent or of short duration when for other reasons). If current dressing causes regression in wound condition, may D/C ordered dressing product/s and apply Normal Saline Moist Dressing daily until next Wound Healing Center / Other MD appointment. Notify Wound Healing Center of regression in wound condition at [phone]. Please direct any NON-WOUND related issues/requests for orders to patient's Primary Care Physician Wound #7 Left Toe Third: Continue Home [facility] Visits - [facility] may visit PRN to address patient s wound care needs. FACE TO FACE ENCOUNTER: MEDICARE and MEDICAID PATIENTS: I certify that this patient is under my care and that I had a face-to-face encounter that meets the physician face-to-face encounter requirements with this patient on this date. The encounter with the patient was in whole or in part for the following MEDICAL CONDITION: (primary reason for Home Healthcare) MEDICAL NECESSITY: I certify, that based on my findings, NURSING services are a medically  necessary home health service. HOME BOUND STATUS: I certify that my clinical findings support that this patient is homebound (i.e., Due to illness or injury, pt requires aid of supportive devices such as crutches, cane, wheelchairs, walkers, the use of special transportation or the assistance of another person to leave their place of residence. There is a normal inability to leave the home and doing so requires considerable and taxing effort. Other absences are for medical reasons / religious services and are infrequent or of short duration when for other reasons). If current dressing causes regression in wound condition, may D/C ordered dressing product/s and apply Normal Saline Moist Dressing daily until next Wound Healing Center / Other MD appointment. Notify Wound Healing Center of regression in wound condition at [phone]. Please direct any NON-WOUND related issues/requests for orders to patient's Primary Care Physician Medications-please add to medication list.: Wound #5 Left,Distal Toe Great: P.O. Antibiotics - Augmentin Wound #7 Left Toe Third: P.O. Antibiotics - Augmentin The following medication(s) was prescribed: Augmentin oral 875 mg-125 mg tablet 1 tablet oral bid for 7 days for cellulitis left great toe starting 10/05/2017 #1. That really concerns me here as the plantar aspect of the left first toe. There is erythema, tenderness and swelling which demarcates right towards the base of the toe. I suspect this is probably progressive ischemia/ischemic change rather than infection however I felt obligated to give her antibiotics #2 all of this is painful predominantly on the left foot but also the right. I suspect this is claudication with minimal activity perhaps even at rest. She is  using oxycodone 4 times a day. #3 will check Dr. Hazle Coca last review of this. apparently Dopplers bilaterally and may Electronic Signature(s) Signed: 10/05/2017 5:02:04 PM By: Baltazar Najjar  MD Entered By: Baltazar Najjar on 10/05/2017 11:01:26 Difabio, [name] ([id number]) -------------------------------------------------------------------------------- SuperBill Details Patient Name: [name] Date of Service: 10/05/2017 Medical Record Number: [id number] Patient Account Number: [id number] Date of Birth/Sex: Oct 18, 1945 (72 y.o. 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 Atherosclerosis of nonbiological bypass graft(s) of the extremities with gangrene, right leg Facility Procedures CPT4 Code: 08657846 Description: 96295 - WOUND CARE VISIT-LEV 5 EST PT Modifier: Quantity: 1 Physician Procedures CPT4 Code Description: 2841324 40102 - WC PHYS LEVEL 3 - EST PT ICD-10 Diagnosis Description 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

## 2017-10-12 ENCOUNTER — Encounter: Payer: Medicare Other | Admitting: Internal Medicine

## 2017-10-12 DIAGNOSIS — E11621 Type 2 diabetes mellitus with foot ulcer: Secondary | ICD-10-CM | POA: Diagnosis not present

## 2017-10-17 NOTE — Progress Notes (Signed)
IDIL, MASLANKA ([id number]) Visit Report for 10/12/2017 HPI Details Patient Name: [name], [name] Date of Service: 10/12/2017 1:15 PM Medical Record Number: [id number] Patient Account Number: [id number] Date of Birth/Sex: [birth year] (71 y.o. F) Treating RN: Huel Coventry Primary Care Provider: Quintin Alto Other Clinician: Referring Provider: Quintin Alto Treating Provider/Extender: Altamese [place] in Treatment: 17 History of Present Illness Location: right foot toes Quality: Patient reports experiencing a sharp pain to affected area(s). Severity: Patient states wound are getting worse. Duration: Patient has had the wound for < 2 weeks prior to presenting for treatment Timing: Pain in wound is constant (hurts all the time) Context: The wound appeared gradually over time Modifying Factors: Other treatment(s) tried include:recent interventional process to place a stent in her right lower extremity for critical limb ischemia Associated Signs and Symptoms: Patient reports having foul odor. HPI Description: 72 year old female who was referred by Dr. Nanetta Batty, who performed a endovascular procedure on 05/26/2017, for critical limb ischemia. Her angiogram showed a 99% below the knee popliteal stenosis which is stented with a 4 mm drug eluting stent. On 06/10/2017 he noted that her pulses are palpable and her Dopplers have normalized. He referred her to wound care for further advice and put her on Keflex 500 mg twice daily for 10 days. She has a past medical history of asthma, coronary artery disease, CHF, hyperlipidemia, ischemic cardiomyopathy, peripheral arterial disease and type 2 diabetes mellitus. She is status post recent abdominal aortogram with right lower extremity stenting, cholecystectomy, CABG, parathyroidectomy, thyroidectomy. She is a former smoker and quit in 1988. She had an ABI performed on 06/10/2017 with the right ABI within normal range and the left ABI shows  mild lower extremity arterial disease. The right ABI was 1.0 and biphasic and the left ABI was 0.93 and biphasic. Toe pressures were abnormal with the right being 0.27 and left being 0.31. the duplex study done showed a patent right popliteal artery stent without evidence of focal stenosis or obstruction. of note the patient started having signs of critical limb ischemia somewhere at the end of November and after an urgent workup was taken up for limb salvage with a successfully placed stent by Dr. Nanetta Batty, with good arterial perfusion postprocedure with normal ABIs noted on 06/10/2017. 06/22/17; patient was admitted to our clinic last week by Dr. Meyer Russel.she is a type II diabetic. She had undergone a right popliteal DES on 11/29 for critical limb ischemia including erythema and pain of her right forefoot. She states about 3 days later she started developing ulcerations in predominantly the right foot but also the left third toe. 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 This is never really healed rib. Repeat ABIs on 12/14 showed a right ABI of 1 and a left ABI of 0.93 with toe pressures be markedly abnormal as noted above and Dr. Marcie Bal notes. The patient is still having a lot of pain making it very difficult for her to function at home this is worse on the right greater than left foot she does describe severe lancinating pain. She does not ambulate all that much and she finds it intolerable to wear footwear Her history is also notable for A. fib, severe cardiomyopathy with an ejection fraction of 25%_30%. Last echocardiogram in June of this year. According to Epic she is on a combination of Eliquis plavix and aspirin. 06/29/17; patient has an appointment with Dr. Imogene Burn of vascular surgery I believe  next week. This was arranged by her primary physician. She is still having a lot of pain in the right side in general most of her toes look better  however. Supposed to be using silver alginate however [facility] applying currently applied wet to dry dressings. 07/06/17; the patient went for appointment with Dr. Imogene Burn. She is status post right popliteal stenting with peroneal artery angioplasty with Dr. Gery Pray. She presented to our clinic with bilateral lower extremity wounds in her toes which are exceptionally painful. Dr. Imogene Burn put her on a course of antibiotics. Her arterial studies from 06/10/17 showed an ABI in the right of 1 and ABI in the left of 0.8. Waveforms at the dorsalis pedis and posterior tibial were biphasic. However routine ABIs were Ammirati, Carizma G. ([id number]) 0.31 bilaterally. The patient has severe bilateral rest pain. 07/20/17; the patient had her arteriogram by Dr. Imogene Burn on 07/14/17. Based on the images the patient was not felt to require any interventions and no further interventions were possible due to small size of the tibial arteries. The posterior tibial arteries bilaterally were occluded heel arteries were the dominant runoff which were miniscule. Anterior tibials both showed subtotal occlusion in the mid segment and these were small vessels. Paradoxically the patient complains of a lot less pain in the right foot however she is having a lot of pain in the left first and left third toes. She is managing this with activity limitation and narcotics. 07/27/17; I've spoken to Dr. Allyson Sabal today and he is going to do another arteriogram on the left leg early next week with possibility of retrograde arterial intervention. The patient is still having a lot of pain in the left third toe and left first toe which is worse when she puts her leg up i.e. lies in bed etc. 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 She has done better on the right which is the side where she had the DES stent placed in the right popliteal. She still has a wound on the medial aspect of the third toe and black eschar at the tip of the fourth and  fifth toes however most of the rest of the right foot looks better. 08/10/17; the patient was taken for angiography on 08/01/17; she had a successful left peroneal PTA and drug eluding stent now has a widely patent peroneal supply collateral to the distal posterior tibial that feeds the dorsal pedal arch. The patient is on antiplatelet therapy. Unfortunately she has not had much relief of pain in the left great and third toe. She has had an increase in her hydrocodone by her primary physician. She had follow-up noninvasive Doppler test done yesterday although I cannot pull up these results 08/17/17; patient returns to clinic. She states she feels better. She's had an increase in her hydrocodone to 10/325 she is taking a half a tablet every 3 hours and occasionally a whole one at night which allows her to sleep through the night. She is not having pain in the right foot but is having pain in the left. She went to see Dr. Allyson Sabal yesterday. He noted that he performed perineal interventions on 2//19 with 2 overlapping drug-eluting stents for perineal CTO. Her left posterior tibial is occluded as was her left anterior tibial at the level of the ankle. Recent Doppler showed patent peroneal stent. He takes it me yesterday to report the patient was in less pain which she seems to verify although this may be because of an increase  in strength and frequency of her narcotics culture the drainage I did last week showed both methicillin sensitive staph aureus and Pseudomonas. I sent in ciprofloxacin I believe for 10 days which should cover both of these. This may be partially why the pain in the right great toe is better 08/24/17; the patient is generally better. Especially the areas on the right foot where there are still some ischemic change on the right third and right fifth toes but her foot is warm here and I [name]'t think is contributing much to her pain. She still has considerable dry gangrene on the right great  toe with a open area medially. Also the left third toe. Things are still very tender here but considerably better. She doesn't have any purulent drainage or cellulitis and she is finished the antibiotics I gave her. She arrived in clinic with a low blood pressure she is having some chest pain 09/07/17; the patient continues to make decent improvement. She is in a lot less pain only using one half of a hydrocodone 10/325 3 times a day. There is really nothing open except for a small eschar on the tip of her right fourth toe. She still has thick eschar/dry gangrene on the left first and third toes although the pain here is a lot better as well. 10/05/17; we received a call from the patient's home health nurse concerned about erythema of the toes in the left foot requesting an antibiotic. I asked her to be seen by somebody in however she refused and came in for an appointment today. She is still having a fair amount of foot pain especially on the left she takes half and I'm in oxycodone 54 times a day. She tells me she is not eating well. She received 2 units of blood and has a GI follow-up for presumably GI-related anemia. She tells me she saw Dr. Allyson Sabal 2 weeks ago and he is going to follow-up with arterial Dopplers in May 10/12/17; I saw her last week concerned about infection in the plantar left great toe. Marked tenderness. I gave her Augmentin empirically on seeing her back in follow-up of this. She has an appointment with Dr. Allyson Sabal on May 1 presumably for follow-up Dopplers. This patient tends to minimize the amount of discomfort she is in however she tells me for most of this week the pain was not particularly bearable in the left foot. She states yesterday was better. We have been using silver alginate. She has no open area on the right foot although she is still having pain predominantly in the right fourth toe Electronic Signature(s) Signed: 10/12/2017 5:09:03 PM By: Baltazar Najjar MD Entered  By: Baltazar Najjar on 10/12/2017 14:55:00 [name], [name] ([id number]) -------------------------------------------------------------------------------- Physical Exam Details Patient Name: [name] Date of Service: 10/12/2017 1:15 PM Medical Record Number: [id number] Patient Account Number: [id number] Date of Birth/Sex: 1946-01-25 (71 y.o. F) Treating RN: Huel Coventry Primary Care Provider: Quintin Alto Other Clinician: Referring Provider: Quintin Alto Treating Provider/Extender: Altamese [place] in Treatment: 17 Constitutional Patient is hypotensive.however she appears well. Pulse regular and within target range for patient.Marland Kitchen Respirations regular, non- labored and within target range.. Temperature is normal and within the target range for the patient.Marland Kitchen appears in no distress. Eyes Conjunctivae clear. No discharge. Respiratory Respiratory effort is easy and symmetric bilaterally. Rate is normal at rest and on room air.. Cardiovascular Pedal pulses absent bilaterally.. Lymphatic none palpable in the popliteal or inguinal area. Musculoskeletal there is no obvious  involvement of the interphalangeal or metatarsophalangeal joints of either foot. Integumentary (Hair, Skin) there is no systemic skin issues. Psychiatric No evidence of depression, anxiety, or agitation. Calm, cooperative, and communicative. Appropriate interactions and affect.. Notes wound exam oThe patient continues to have dry gangrene at the tip of the left first toe and the left third toe. There is an open wound on the medial aspect of left first toe that is been chronic and really is unchanged the marked tenderness and swelling in the plantar aspect of left great toe is a lot better this week suggesting maybe Augmentin as helped. The left third toe looks about the same oThere is no open area on the right at all however her toes are cold looking somewhat ischemic. The right fourth toe continues to be  tender but there is no open area there Electronic Signature(s) Signed: 10/12/2017 5:09:03 PM By: Baltazar Najjar MD Entered By: Baltazar Najjar on 10/12/2017 14:59:41 [name], [name] ([id number]) -------------------------------------------------------------------------------- Physician Orders Details Patient Name: [name] Date of Service: 10/12/2017 1:15 PM Medical Record Number: [id number] Patient Account Number: [id number] Date of Birth/Sex: Oct 17, 1945 (71 y.o. F) Treating RN: Huel Coventry Primary Care Provider: Quintin Alto Other Clinician: Referring Provider: Quintin Alto Treating Provider/Extender: Altamese [place] in Treatment: 56 Verbal / Phone Orders: No Diagnosis Coding Wound Cleansing Wound #1 Right,Distal Toe - Web between 2nd and 3rd o Clean wound with Normal Saline. o May Shower, gently pat wound dry prior to applying new dressing. Wound #2 Right Toe - Web between 3rd and 4th o Clean wound with Normal Saline. o May Shower, gently pat wound dry prior to applying new dressing. Wound #5 Left,Distal Toe Great o Clean wound with Normal Saline. o May Shower, gently pat wound dry prior to applying new dressing. Wound #6 Left,Lateral Toe Great o Clean wound with Normal Saline. o May Shower, gently pat wound dry prior to applying new dressing. Wound #7 Left Toe Third o Clean wound with Normal Saline. o May Shower, gently pat wound dry prior to applying new dressing. Anesthetic (add to Medication List) Wound #1 Right,Distal Toe - Web between 2nd and 3rd o Topical Lidocaine 4% cream applied to wound bed prior to debridement (In Clinic Only). Wound #2 Right Toe - Web between 3rd and 4th o Topical Lidocaine 4% cream applied to wound bed prior to debridement (In Clinic Only). Wound #5 Left,Distal Toe Great o Topical Lidocaine 4% cream applied to wound bed prior to debridement (In Clinic Only). Wound #6 Left,Lateral Toe Great o  Topical Lidocaine 4% cream applied to wound bed prior to debridement (In Clinic Only). Wound #7 Left Toe Third o Topical Lidocaine 4% cream applied to wound bed prior to debridement (In Clinic Only). Skin Barriers/Peri-Wound Care Wound #5 Left,Distal Toe Great o Other: - Betadine Wound #6 Left,Lateral Toe Great o Other: - Betadine Wound #7 Left Toe Third [place], ARPI DIEBOLD ([id number]) o Other: - Betadine Primary Wound Dressing Wound #1 Right,Distal Toe - Web between 2nd and 3rd o Other: - betadine, silvercell toes separated Wound #2 Right Toe - Web between 3rd and 4th o Other: - betadine, silvercell toes separated Wound #5 Left,Distal Toe Great o Other: - betadine, silvercell toes separated Wound #6 Left,Lateral Toe Great o Other: - betadine, silvercell toes separated Wound #7 Left Toe Third o Other: - betadine, silvercell toes separated Secondary Dressing Wound #1 Right,Distal Toe - Web between 2nd and 3rd o ABD and Kerlix/Conform Wound #2 Right Toe - Web between  3rd and 4th o ABD and Kerlix/Conform Wound #5 Left,Distal Toe Great o ABD and Kerlix/Conform Wound #6 Left,Lateral Toe Great o ABD and Kerlix/Conform Wound #7 Left Toe Third o ABD and Kerlix/Conform Dressing Change Frequency Wound #1 Right,Distal Toe - Web between 2nd and 3rd o Change dressing every other day. Wound #2 Right Toe - Web between 3rd and 4th o Change dressing every other day. Wound #5 Left,Distal Toe Great o Change dressing every other day. Wound #6 Left,Lateral Toe Great o Change dressing every other day. Wound #7 Left Toe Third o Change dressing every other day. Follow-up Appointments Wound #1 Right,Distal Toe - Web between 2nd and 3rd o Return Appointment in 1 week. Wound #2 Right Toe - Web between 3rd and 4th o Return Appointment in 1 week. [name], [name] Kitchen ([id number]) Wound #5 Left,Distal Toe Great o Return Appointment in 1 week. Wound #6  Left,Lateral Toe Great o Return Appointment in 1 week. Wound #7 Left Toe Third o Return Appointment in 1 week. Edema Control Wound #1 Right,Distal Toe - Web between 2nd and 3rd o Elevate legs to the level of the heart and pump ankles as often as possible Wound #2 Right Toe - Web between 3rd and 4th o Elevate legs to the level of the heart and pump ankles as often as possible Wound #5 Left,Distal Toe Great o Elevate legs to the level of the heart and pump ankles as often as possible Wound #6 Left,Lateral Toe Great o Elevate legs to the level of the heart and pump ankles as often as possible Wound #7 Left Toe Third o Elevate legs to the level of the heart and pump ankles as often as possible Off-Loading Wound #1 Right,Distal Toe - Web between 2nd and 3rd o Open toe surgical shoe to: Wound #2 Right Toe - Web between 3rd and 4th o Open toe surgical shoe to: Wound #5 Left,Distal Toe Great o Open toe surgical shoe to: Wound #6 Left,Lateral Toe Great o Open toe surgical shoe to: Wound #7 Left Toe Third o Open toe surgical shoe to: Additional Orders / Instructions Wound #1 Right,Distal Toe - Web between 2nd and 3rd o Increase protein intake. Wound #2 Right Toe - Web between 3rd and 4th o Increase protein intake. Wound #5 Left,Distal Toe Great o Increase protein intake. Wound #6 Left,Lateral Toe Great o Increase protein intake. Wound #7 Left Toe Third [name], [name] ([id number]) o Increase protein intake. Home Health Wound #1 Right,Distal Toe - Web between 2nd and 3rd o Continue Home Health Visits - [facility] o Home Health Nurse may visit PRN to address patientos wound care needs. o FACE TO FACE ENCOUNTER: MEDICARE and MEDICAID PATIENTS: I certify that this patient is under my care and that I had a face-to-face encounter that meets the physician face-to-face encounter requirements with this patient on this date. The encounter with the patient  was in whole or in part for the following MEDICAL CONDITION: (primary reason for Home Healthcare) MEDICAL NECESSITY: I certify, that based on my findings, NURSING services are a medically necessary home health service. HOME BOUND STATUS: I certify that my clinical findings support that this patient is homebound (i.e., Due to illness or injury, pt requires aid of supportive devices such as crutches, cane, wheelchairs, walkers, the use of special transportation or the assistance of another person to leave their place of residence. There is a normal inability to leave the home and doing so requires considerable and taxing effort. Other absences are  for medical reasons / religious services and are infrequent or of short duration when for other reasons). o If current dressing causes regression in wound condition, may D/C ordered dressing product/s and apply Normal Saline Moist Dressing daily until next Wound Healing Center / Other MD appointment. Notify Wound Healing Center of regression in wound condition at [phone]. o Please direct any NON-WOUND related issues/requests for orders to patient's Primary Care Physician Wound #2 Right Toe - Web between 3rd and 4th o Continue Home Health Visits - [facility] o Home Health Nurse may visit PRN to address patientos wound care needs. o FACE TO FACE ENCOUNTER: MEDICARE and MEDICAID PATIENTS: I certify that this patient is under my care and that I had a face-to-face encounter that meets the physician face-to-face encounter requirements with this patient on this date. The encounter with the patient was in whole or in part for the following MEDICAL CONDITION: (primary reason for Home Healthcare) MEDICAL NECESSITY: I certify, that based on my findings, NURSING services are a medically necessary home health service. HOME BOUND STATUS: I certify that my clinical findings support that this patient is homebound (i.e., Due to illness or injury, pt requires  aid of supportive devices such as crutches, cane, wheelchairs, walkers, the use of special transportation or the assistance of another person to leave their place of residence. There is a normal inability to leave the home and doing so requires considerable and taxing effort. Other absences are for medical reasons / religious services and are infrequent or of short duration when for other reasons). o If current dressing causes regression in wound condition, may D/C ordered dressing product/s and apply Normal Saline Moist Dressing daily until next Wound Healing Center / Other MD appointment. Notify Wound Healing Center of regression in wound condition at [phone]. o Please direct any NON-WOUND related issues/requests for orders to patient's Primary Care Physician Wound #5 Left,Distal Toe Great o Continue Home Health Visits - [facility] o Home Health Nurse may visit PRN to address patientos wound care needs. o FACE TO FACE ENCOUNTER: MEDICARE and MEDICAID PATIENTS: I certify that this patient is under my care and that I had a face-to-face encounter that meets the physician face-to-face encounter requirements with this patient on this date. The encounter with the patient was in whole or in part for the following MEDICAL CONDITION: (primary reason for Home Healthcare) MEDICAL NECESSITY: I certify, that based on my findings, NURSING services are a medically necessary home health service. HOME BOUND STATUS: I certify that my clinical findings support that this patient is homebound (i.e., Due to illness or injury, pt requires aid of supportive devices such as crutches, cane, wheelchairs, walkers, the use of special transportation or the assistance of another person to leave their place of residence. There is a normal inability to leave the home and doing so requires considerable and taxing effort. Other absences are for medical reasons / religious services and are infrequent or of short  duration when for other reasons). o If current dressing causes regression in wound condition, may D/C ordered dressing product/s and apply Normal Saline Moist Dressing daily until next Wound Healing Center / Other MD appointment. Notify Wound Healing Center of regression in wound condition at [phone]. o Please direct any NON-WOUND related issues/requests for orders to patient's Primary Care Physician Wound #6 [address] [place] ([id number]) o Continue Home Health Visits - [facility] o Home Health Nurse may visit PRN to address patientos wound care needs. o FACE TO FACE  ENCOUNTER: MEDICARE and MEDICAID PATIENTS: I certify that this patient is under my care and that I had a face-to-face encounter that meets the physician face-to-face encounter requirements with this patient on this date. The encounter with the patient was in whole or in part for the following MEDICAL CONDITION: (primary reason for Home Healthcare) MEDICAL NECESSITY: I certify, that based on my findings, NURSING services are a medically necessary home health service. HOME BOUND STATUS: I certify that my clinical findings support that this patient is homebound (i.e., Due to illness or injury, pt requires aid of supportive devices such as crutches, cane, wheelchairs, walkers, the use of special transportation or the assistance of another person to leave their place of residence. There is a normal inability to leave the home and doing so requires considerable and taxing effort. Other absences are for medical reasons / religious services and are infrequent or of short duration when for other reasons). o If current dressing causes regression in wound condition, may D/C ordered dressing product/s and apply Normal Saline Moist Dressing daily until next Wound Healing Center / Other MD appointment. Notify Wound Healing Center of regression in wound condition at [phone]. o Please direct any  NON-WOUND related issues/requests for orders to patient's Primary Care Physician Wound #7 Left Toe Third o Continue Home Health Visits - [facility] o Home Health Nurse may visit PRN to address patientos wound care needs. o FACE TO FACE ENCOUNTER: MEDICARE and MEDICAID PATIENTS: I certify that this patient is under my care and that I had a face-to-face encounter that meets the physician face-to-face encounter requirements with this patient on this date. The encounter with the patient was in whole or in part for the following MEDICAL CONDITION: (primary reason for Home Healthcare) MEDICAL NECESSITY: I certify, that based on my findings, NURSING services are a medically necessary home health service. HOME BOUND STATUS: I certify that my clinical findings support that this patient is homebound (i.e., Due to illness or injury, pt requires aid of supportive devices such as crutches, cane, wheelchairs, walkers, the use of special transportation or the assistance of another person to leave their place of residence. There is a normal inability to leave the home and doing so requires considerable and taxing effort. Other absences are for medical reasons / religious services and are infrequent or of short duration when for other reasons). o If current dressing causes regression in wound condition, may D/C ordered dressing product/s and apply Normal Saline Moist Dressing daily until next [facility] / Other MD appointment. Notify [facility] of regression in wound condition at [phone]. o Please direct any NON-WOUND related issues/requests for orders to patient's Primary Care Physician Medications-please add to medication list. Wound #1 Right,Distal Toe - Web between 2nd and 3rd o P.O. Antibiotics - Augmentin Wound #2 Right Toe - Web between 3rd and 4th o P.O. Antibiotics - Augmentin Wound #5 Left,Distal Toe Great o P.O. Antibiotics - Augmentin Wound #6 Left,Lateral  Toe Great o P.O. Antibiotics - Augmentin Wound #7 Left Toe Third o P.O. Antibiotics - Augmentin Electronic Signature(s) Signed: 10/12/2017 5:09:03 PM By: Baltazar Najjar MD Signed: 10/12/2017 5:11:52 PM By: Elliot Gurney, BSN, RN, CWS, Kim RN, BSN Entered By: Elliot Gurney, BSN, RN, CWS, Kim on 10/12/2017 14:05:33 [name], [name] ([id number]) [name], [name] ([id number]) -------------------------------------------------------------------------------- Problem List Details Patient Name: [name] Date of Service: 10/12/2017 1:15 PM Medical Record Number: [id number] Patient Account Number: [id number] Date of Birth/Sex: 18-Dec-1945 (72 y.o. 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 F) Treating RN: Huel Coventry Primary Care Provider: Quintin Alto Other Clinician: Referring Provider: Quintin Alto Treating Provider/Extender: Altamese [place] in Treatment:  17 Subjective History of Present Illness (HPI) The following HPI elements were documented for the patient's wound: Location: right foot toes Quality: Patient reports experiencing a sharp pain to affected area(s). Severity: Patient states wound are getting worse. Duration: Patient has had the wound for < 2 weeks prior to presenting for treatment Timing: Pain in wound is constant (hurts all the time) Context: The wound appeared gradually over time Modifying Factors: Other treatment(s) tried include:recent interventional process to place a stent in her right lower extremity for critical limb ischemia Associated Signs and Symptoms: Patient reports having foul odor. 72 year old female who was referred by Dr. Nanetta Batty, who performed a endovascular procedure on 05/26/2017, for critical limb ischemia. Her angiogram showed a 99% below the knee popliteal stenosis which is stented with a 4 mm drug eluting stent. On 06/10/2017 he noted that her pulses are palpable and her Dopplers have normalized. He referred her to wound care for further advice and put her on Keflex 500 mg twice daily for 10 days. She has a past medical history of asthma, coronary artery disease, CHF, hyperlipidemia, ischemic cardiomyopathy, peripheral arterial disease and type 2 diabetes mellitus. She is status post recent abdominal aortogram with right lower extremity stenting, cholecystectomy, CABG, parathyroidectomy, thyroidectomy. She is a former smoker and quit in 1988. 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 The right ABI was 1.0 and biphasic and the left ABI was 0.93 and biphasic. Toe pressures were abnormal with the right being 0.27 and left being 0.31. the duplex study done showed a patent right popliteal artery stent without evidence of focal stenosis or obstruction. of note the patient started having signs of critical  limb ischemia somewhere at the end of November and after an urgent workup was taken up for limb salvage with a successfully placed stent by Dr. Nanetta Batty, with good arterial perfusion postprocedure with normal ABIs noted on 06/10/2017. 06/22/17; patient was admitted to our clinic last week by Dr. Meyer Russel.she is a type II diabetic. She had undergone a right popliteal DES on 11/29 for critical limb ischemia including erythema and pain of her right forefoot. She states about 3 days later she started developing ulcerations in predominantly the right foot but also the left third toe. 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 This is never really healed rib. Repeat ABIs on 12/14 showed a right ABI of 1 and a left ABI of 0.93 with toe  pressures be markedly abnormal as noted above and Dr. Marcie BalBritto's notes. The patient is still having a lot of pain making it very difficult for her to function at home this is worse on the right greater than left foot she does describe severe lancinating pain. She does not ambulate all that much and she finds it intolerable to wear footwear Her history is also notable for A. fib, severe cardiomyopathy with an ejection fraction of 25%_30%. Last echocardiogram in June of this year. According to Epic she is on a combination of Eliquis plavix and aspirin. 06/29/17; patient has an appointment with Dr. Imogene Burnhen of vascular surgery I believe next week. This was arranged by her primary physician. She is still having a lot of pain in the right side in general most of her toes look better however. Supposed to be using silver alginate however [facility] applying currently applied wet to dry dressings. 07/06/17; the patient went for appointment with Dr. Imogene Burnhen. She is status post right popliteal stenting with peroneal artery angioplasty with Dr. Gery PrayBarry. She presented to our clinic with bilateral lower extremity wounds in her toes which  are exceptionally painful. Dr. Imogene Burnhen put her on a course of antibiotics. Her arterial studies from 06/10/17 showed an ABI in the right of 1 and ABI in the left of 0.8. Waveforms at the dorsalis pedis and posterior tibial were biphasic. However routine ABIs were [name], [name] ([id number]) 0.31 bilaterally. The patient has severe bilateral rest pain. 07/20/17; the patient had her arteriogram by Dr. Imogene Burnhen on 07/14/17. Based on the images the patient was not felt to require any interventions and no further interventions were possible due to small size of the tibial arteries. The posterior tibial arteries bilaterally were occluded heel arteries were the dominant runoff which were miniscule. Anterior tibials both showed subtotal occlusion in the mid segment and these were small vessels. Paradoxically the patient complains of a lot less pain in the right foot however she is having a lot of pain in the left first and left third toes. She is managing this with activity limitation and narcotics. 07/27/17; I've spoken to Dr. Allyson SabalBerry today and he is going to do another arteriogram on the left leg early next week with possibility of retrograde arterial intervention. The patient is still having a lot of pain in the left third toe and left first toe which is worse when she puts her leg up i.e. lies in bed etc. 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 She has done better on the right which is the side where she had the DES stent placed in the right popliteal. She still has a wound on the medial aspect of the third toe and black eschar at the tip of the fourth and fifth toes however most of the rest of the right foot looks better. 08/10/17; the patient was taken for angiography on 08/01/17; she had a successful left peroneal PTA and drug eluding stent now has a widely patent peroneal supply collateral to the distal posterior tibial that feeds the dorsal pedal arch. The patient is on antiplatelet therapy. Unfortunately  she has not had much relief of pain in the left great and third toe. She has had an increase in her hydrocodone by her primary physician. She had follow-up noninvasive Doppler test done yesterday although I cannot pull up these results 08/17/17; patient returns to clinic. She states she feels better. She's had an increase in her hydrocodone to 10/325 she is taking a half  a tablet every 3 hours and occasionally a whole one at night which allows her to sleep through the night. She is not having pain in the right foot but is having pain in the left. She went to see Dr. Allyson Sabal yesterday. He noted that he performed perineal interventions on 2//19 with 2 overlapping drug-eluting stents for perineal CTO. Her left posterior tibial is occluded as was her left anterior tibial at the level of the ankle. Recent Doppler showed patent peroneal stent. He takes it me yesterday to report the patient was in less pain which she seems to verify although this may be because of an increase in strength and frequency of her narcotics culture the drainage I did last week showed both methicillin sensitive staph aureus and Pseudomonas. I sent in ciprofloxacin I believe for 10 days which should cover both of these. This may be partially why the pain in the right great toe is better 08/24/17; the patient is generally better. Especially the areas on the right foot where there are still some ischemic change on the right third and right fifth toes but her foot is warm here and I [name]'t think is contributing much to her pain. She still has considerable dry gangrene on the right great toe with a open area medially. Also the left third toe. Things are still very tender here but considerably better. She doesn't have any purulent drainage or cellulitis and she is finished the antibiotics I gave her. She arrived in clinic with a low blood pressure she is having some chest pain 09/07/17; the patient continues to make decent improvement. She  is in a lot less pain only using one half of a hydrocodone 10/325 3 times a day. There is really nothing open except for a small eschar on the tip of her right fourth toe. She still has thick eschar/dry gangrene on the left first and third toes although the pain here is a lot better as well. 10/05/17; we received a call from the patient's home health nurse concerned about erythema of the toes in the left foot requesting an antibiotic. I asked her to be seen by somebody in however she refused and came in for an appointment today. She is still having a fair amount of foot pain especially on the left she takes half and I'm in oxycodone 54 times a day. She tells me she is not eating well. She received 2 units of blood and has a GI follow-up for presumably GI-related anemia. She tells me she saw Dr. Allyson Sabal 2 weeks ago and he is going to follow-up with arterial Dopplers in May 10/12/17; I saw her last week concerned about infection in the plantar left great toe. Marked tenderness. I gave her Augmentin empirically on seeing her back in follow-up of this. She has an appointment with Dr. Allyson Sabal on May 1 presumably for follow-up Dopplers. This patient tends to minimize the amount of discomfort she is in however she tells me for most of this week the pain was not particularly bearable in the left foot. She states yesterday was better. We have been using silver alginate. She has no open area on the right foot although she is still having pain predominantly in the right fourth toe Objective Constitutional Patient is hypotensive.however she appears well. Pulse regular and within target range for patient.Marland Kitchen Respirations regular, non- [name], [name] ([id number]) labored and within target range.. Temperature is normal and within the target range for the patient.Marland Kitchen appears in no distress. Vitals Time  Taken: 1:34 PM, Height: 63 in, Weight: 176 lbs, BMI: 31.2, Temperature: 98.4 F, Pulse: 101 bpm, Respiratory Rate: 18  breaths/min, Blood Pressure: 99/68 mmHg. Eyes Conjunctivae clear. No discharge. Respiratory Respiratory effort is easy and symmetric bilaterally. Rate is normal at rest and on room air.. Cardiovascular Pedal pulses absent bilaterally.. Lymphatic none palpable in the popliteal or inguinal area. Musculoskeletal there is no obvious involvement of the interphalangeal or metatarsophalangeal joints of either foot. Psychiatric No evidence of depression, anxiety, or agitation. Calm, cooperative, and communicative. Appropriate interactions and affect.. General Notes: wound exam The patient continues to have dry gangrene at the tip of the left first toe and the left third toe. There is an open wound on the medial aspect of left first toe that is been chronic and really is unchanged the marked tenderness and swelling in the plantar aspect of left great toe is a lot better this week suggesting maybe Augmentin as helped. The left third toe looks about the same There is no open area on the right at all however her toes are cold looking somewhat ischemic. The right fourth toe continues to be tender but there is no open area there Integumentary (Hair, Skin) there is no systemic skin issues. Wound #1 status is Open. Original cause of wound was Gradually Appeared. The wound is located on the Right,Distal Toe - Web between 2nd and 3rd. The wound measures 0.8cm length x 0.5cm width x 0.1cm depth; 0.314cm^2 area and 0.031cm^3 volume. There is no tunneling or undermining noted. There is a large amount of serous drainage noted. The wound margin is flat and intact. There is large (67-100%) red granulation within the wound bed. There is no necrotic tissue within the wound bed. The periwound skin appearance did not exhibit: Callus, Crepitus, Excoriation, Induration, Rash, Scarring, Dry/Scaly, Maceration, Atrophie Blanche, Cyanosis, Ecchymosis, Hemosiderin Staining, Mottled, Pallor, Rubor, Erythema.  Periwound temperature was noted as No Abnormality. The periwound has tenderness on palpation. Wound #2 status is Open. Original cause of wound was Pressure Injury. The wound is located on the Right Toe - Web between 3rd and 4th. The wound measures 0.5cm length x 0.5cm width x 0.1cm depth; 0.196cm^2 area and 0.02cm^3 volume. There is no tunneling or undermining noted. There is a small amount of serous drainage noted. The wound margin is flat and intact. There is no granulation within the wound bed. There is a large (67-100%) amount of necrotic tissue within the wound bed including Eschar. The periwound skin appearance exhibited: Cyanosis. The periwound skin appearance did not exhibit: Callus, Crepitus, Excoriation, Induration, Rash, Scarring, Dry/Scaly, Maceration, Atrophie Blanche, Ecchymosis, Hemosiderin Staining, Mottled, Pallor, Rubor, Erythema. Periwound temperature was noted as No Abnormality. The periwound has tenderness on palpation. Wound #4 status is Healed - Epithelialized. Original cause of wound was Gradually Appeared. The wound is located on the Right,Dorsal Toe Fifth. The wound measures 0cm length x 0cm width x 0cm depth; 0cm^2 area and 0cm^3 volume. Wound #5 status is Open. Original cause of wound was Gradually Appeared. The wound is located on the [employer]. The wound measures 2.4cm length x 2.5cm width x 0.1cm depth; 4.712cm^2 area and 0.471cm^3 volume. There is no tunneling or undermining noted. There is a large amount of serous drainage noted. The wound margin is flat and intact. There is no granulation within the wound bed. There is a large (67-100%) amount of necrotic tissue within the wound bed including Eschar and Adherent Slough. The periwound skin appearance exhibited: Cyanosis. The periwound skin  appearance did not exhibit: Callus, Crepitus, Excoriation, Induration, Rash, Scarring, Dry/Scaly, Maceration, Atrophie Blanche, Ecchymosis, Resnick, Swetha G.  ([id number]) Hemosiderin Staining, Mottled, Pallor, Rubor, Erythema. Periwound temperature was noted as No Abnormality. The periwound has tenderness on palpation. Wound #6 status is Open. Original cause of wound was Gradually Appeared. The wound is located on the [employer]. The wound measures 1.2cm length x 0.8cm width x 0.1cm depth; 0.754cm^2 area and 0.075cm^3 volume. There is a large amount of serous drainage noted. The wound margin is flat and intact. There is no granulation within the wound bed. There is a large (67-100%) amount of necrotic tissue within the wound bed including Adherent Slough. The periwound skin appearance exhibited: Cyanosis. The periwound skin appearance did not exhibit: Callus, Crepitus, Excoriation, Induration, Rash, Scarring, Dry/Scaly, Maceration, Atrophie Blanche, Ecchymosis, Hemosiderin Staining, Mottled, Pallor, Rubor, Erythema. Periwound temperature was noted as No Abnormality. The periwound has tenderness on palpation. Wound #7 status is Open. Original cause of wound was Gradually Appeared. The wound is located on the Left Toe Third. The wound measures 1.6cm length x 1.8cm width x 0.3cm depth; 2.262cm^2 area and 0.679cm^3 volume. There is no tunneling or undermining noted. There is a large amount of purulent drainage noted. The wound margin is flat and intact. There is no granulation within the wound bed. There is a large (67-100%) amount of necrotic tissue within the wound bed including Eschar and Adherent Slough. The periwound skin appearance exhibited: Cyanosis. The periwound skin appearance did not exhibit: Callus, Crepitus, Excoriation, Induration, Rash, Scarring, Dry/Scaly, Maceration, Atrophie Blanche, Ecchymosis, Hemosiderin Staining, Mottled, Pallor, Rubor, Erythema. Periwound temperature was noted as No Abnormality. The periwound has tenderness on palpation. 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 May Shower, gently pat wound dry prior to applying new dressing. Wound #2 Right Toe - Web between 3rd and 4th: Clean wound with Normal Saline. May Shower, gently pat wound dry prior to applying new dressing. Wound #5 Left,Distal Toe Great: Clean wound with Normal Saline. May Shower, gently pat wound dry prior to applying new dressing. Wound #6 Left,Lateral Toe Great: Clean wound with Normal Saline. May Shower, gently pat wound dry prior to applying new dressing. Wound #7 Left Toe Third: Clean wound with Normal Saline. May Shower, gently pat wound dry prior to applying new dressing. Anesthetic (add to Medication List): Wound #1 Right,Distal Toe - Web between 2nd and 3rd: Topical Lidocaine 4% cream applied to wound bed prior to debridement (In Clinic Only). [name], Jaedah Reece Agar ([id number]) Wound #2 Right Toe - Web between 3rd and 4th: Topical Lidocaine 4% cream applied to wound bed prior to debridement (In Clinic Only). Wound #5 Left,Distal Toe Great: Topical Lidocaine 4% cream applied to wound bed prior to debridement (In Clinic Only). Wound #6 Left,Lateral Toe Great: Topical Lidocaine 4% cream applied to wound bed prior to debridement (In Clinic Only). Wound #7 Left Toe Third: Topical Lidocaine 4% cream applied to wound bed prior to debridement (In Clinic Only). Skin Barriers/Peri-Wound Care: Wound #5 Left,Distal Toe Great: Other: - Betadine Wound #6 Left,Lateral Toe Great: Other: - Betadine Wound #7 Left Toe Third: Other: - Betadine Primary Wound Dressing:  Wound #1 Right,Distal Toe - Web between 2nd and 3rd: Other: - betadine, silvercell  toes separated Wound #2 Right Toe - Web between 3rd and 4th: Other: - betadine, silvercell toes separated Wound #5 Left,Distal Toe Great: Other: - betadine, silvercell toes separated Wound #6 Left,Lateral Toe Great: Other: - betadine, silvercell toes separated Wound #7 Left Toe Third: Other: - betadine, silvercell toes separated Secondary Dressing: Wound #1 Right,Distal Toe - Web between 2nd and 3rd: ABD and Kerlix/Conform Wound #2 Right Toe - Web between 3rd and 4th: ABD and Kerlix/Conform Wound #5 Left,Distal Toe Great: ABD and Kerlix/Conform Wound #6 Left,Lateral Toe Great: ABD and Kerlix/Conform Wound #7 Left Toe Third: ABD and Kerlix/Conform Dressing Change Frequency: Wound #1 Right,Distal Toe - Web between 2nd and 3rd: Change dressing every other day. Wound #2 Right Toe - Web between 3rd and 4th: Change dressing every other day. Wound #5 Left,Distal Toe Great: Change dressing every other day. Wound #6 Left,Lateral Toe Great: Change dressing every other day. Wound #7 Left Toe Third: Change dressing every other day. Follow-up Appointments: Wound #1 Right,Distal Toe - Web between 2nd and 3rd: Return Appointment in 1 week. Wound #2 Right Toe - Web between 3rd and 4th: Return Appointment in 1 week. Wound #5 Left,Distal Toe Great: Return Appointment in 1 week. Wound #6 Left,Lateral Toe Great: Return Appointment in 1 week. Wound #7 Left Toe Third: Return Appointment in 1 week. Edema Control: TYLOR, GAMBRILL ([id number]) Wound #1 Right,Distal Toe - Web between 2nd and 3rd: Elevate legs to the level of the heart and pump ankles as often as possible Wound #2 Right Toe - Web between 3rd and 4th: Elevate legs to the level of the heart and pump ankles as often as possible Wound #5 Left,Distal Toe Great: Elevate legs to the level of the heart and pump ankles as often as possible Wound #6 Left,Lateral Toe Great: Elevate legs to the level of the heart and pump ankles as often  as possible Wound #7 Left Toe Third: Elevate legs to the level of the heart and pump ankles as often as possible Off-Loading: Wound #1 Right,Distal Toe - Web between 2nd and 3rd: Open toe surgical shoe to: Wound #2 Right Toe - Web between 3rd and 4th: Open toe surgical shoe to: Wound #5 Left,Distal Toe Great: Open toe surgical shoe to: Wound #6 Left,Lateral Toe Great: Open toe surgical shoe to: Wound #7 Left Toe Third: Open toe surgical shoe to: Additional Orders / Instructions: Wound #1 Right,Distal Toe - Web between 2nd and 3rd: Increase protein intake. Wound #2 Right Toe - Web between 3rd and 4th: Increase protein intake. Wound #5 Left,Distal Toe Great: Increase protein intake. Wound #6 Left,Lateral Toe Great: Increase protein intake. Wound #7 Left Toe Third: Increase protein intake. Home Health: Wound #1 Right,Distal Toe - Web between 2nd and 3rd: Continue Home Health Visits - [facility] Nurse may visit PRN to address patient s wound care needs. FACE TO FACE ENCOUNTER: MEDICARE and MEDICAID PATIENTS: I certify that this patient is under my care and that I had a face-to-face encounter that meets the physician face-to-face encounter requirements with this patient on this date. The encounter with the patient was in whole or in part for the following MEDICAL CONDITION: (primary reason for Home Healthcare) MEDICAL NECESSITY: I certify, that based on my findings, NURSING services are a medically necessary home health service. HOME BOUND STATUS: I certify that my clinical findings support that this patient is homebound (i.e., Due to  illness or injury, pt requires aid of supportive devices such as crutches, cane, wheelchairs, walkers, the use of special transportation or the assistance of another person to leave their place of residence. There is a normal inability to leave the home and doing so requires considerable and taxing effort. Other absences are for medical reasons  / religious services and are infrequent or of short duration when for other reasons). If current dressing causes regression in wound condition, may D/C ordered dressing product/s and apply Normal Saline Moist Dressing daily until next Wound Healing Center / Other MD appointment. Notify Wound Healing Center of regression in wound condition at [phone]. Please direct any NON-WOUND related issues/requests for orders to patient's Primary Care Physician Wound #2 Right Toe - Web between 3rd and 4th: Continue Home Health Visits - [facility] may visit PRN to address patient s wound care needs. FACE TO FACE ENCOUNTER: MEDICARE and MEDICAID PATIENTS: I certify that this patient is under my care and that I had a face-to-face encounter that meets the physician face-to-face encounter requirements with this patient on this date. The encounter with the patient was in whole or in part for the following MEDICAL CONDITION: (primary reason for Home Healthcare) MEDICAL NECESSITY: I certify, that based on my findings, NURSING services are a medically necessary home health service. HOME BOUND STATUS: I certify that my clinical findings support that this patient is homebound (i.e., Due to illness or injury, pt requires aid of supportive devices such as crutches, cane, wheelchairs, walkers, the use of special transportation or the assistance of another person to leave their place of residence. There is a normal inability to leave the home and doing so requires considerable and taxing effort. Other absences are for medical reasons / religious services and [facility], SAROYA RICCOBONO. ([id number]) are infrequent or of short duration when for other reasons). If current dressing causes regression in wound condition, may D/C ordered dressing product/s and apply Normal Saline Moist Dressing daily until next Wound Healing Center / Other MD appointment. Notify Wound Healing Center of regression in wound condition at  [phone]. Please direct any NON-WOUND related issues/requests for orders to patient's Primary Care Physician Wound #5 Left,Distal Toe Great: Continue Home Health Visits - [facility] may visit PRN to address patient s wound care needs. FACE TO FACE ENCOUNTER: MEDICARE and MEDICAID PATIENTS: I certify that this patient is under my care and that I had a face-to-face encounter that meets the physician face-to-face encounter requirements with this patient on this date. The encounter with the patient was in whole or in part for the following MEDICAL CONDITION: (primary reason for Home Healthcare) MEDICAL NECESSITY: I certify, that based on my findings, NURSING services are a medically necessary home health service. HOME BOUND STATUS: I certify that my clinical findings support that this patient is homebound (i.e., Due to illness or injury, pt requires aid of supportive devices such as crutches, cane, wheelchairs, walkers, the use of special transportation or the assistance of another person to leave their place of residence. There is a normal inability to leave the home and doing so requires considerable and taxing effort. Other absences are for medical reasons / religious services and are infrequent or of short duration when for other reasons). If current dressing causes regression in wound condition, may D/C ordered dressing product/s and apply Normal Saline Moist Dressing daily until next Wound Healing Center / Other MD appointment. Notify Wound Healing Center of regression in wound condition at [phone].  Please direct any NON-WOUND related issues/requests for orders to patient's Primary Care Physician Wound #6 Left,Lateral Toe Great: Continue Home Health Visits - [facility] may visit PRN to address patient s wound care needs. FACE TO FACE ENCOUNTER: MEDICARE and MEDICAID PATIENTS: I certify that this patient is under my care and that I had a face-to-face  encounter that meets the physician face-to-face encounter requirements with this patient on this date. The encounter with the patient was in whole or in part for the following MEDICAL CONDITION: (primary reason for Home Healthcare) MEDICAL NECESSITY: I certify, that based on my findings, NURSING services are a medically necessary home health service. HOME BOUND STATUS: I certify that my clinical findings support that this patient is homebound (i.e., Due to illness or injury, pt requires aid of supportive devices such as crutches, cane, wheelchairs, walkers, the use of special transportation or the assistance of another person to leave their place of residence. There is a normal inability to leave the home and doing so requires considerable and taxing effort. Other absences are for medical reasons / religious services and are infrequent or of short duration when for other reasons). If current dressing causes regression in wound condition, may D/C ordered dressing product/s and apply Normal Saline Moist Dressing daily until next Wound Healing Center / Other MD appointment. Notify Wound Healing Center of regression in wound condition at [phone]. Please direct any NON-WOUND related issues/requests for orders to patient's Primary Care Physician Wound #7 Left Toe Third: Continue Home Health Visits - [facility] may visit PRN to address patient s wound care needs. FACE TO FACE ENCOUNTER: MEDICARE and MEDICAID PATIENTS: I certify that this patient is under my care and that I had a face-to-face encounter that meets the physician face-to-face encounter requirements with this patient on this date. The encounter with the patient was in whole or in part for the following MEDICAL CONDITION: (primary reason for Home Healthcare) MEDICAL NECESSITY: I certify, that based on my findings, NURSING services are a medically necessary home health service. HOME BOUND STATUS: I certify that my clinical  findings support that this patient is homebound (i.e., Due to illness or injury, pt requires aid of supportive devices such as crutches, cane, wheelchairs, walkers, the use of special transportation or the assistance of another person to leave their place of residence. There is a normal inability to leave the home and doing so requires considerable and taxing effort. Other absences are for medical reasons / religious services and are infrequent or of short duration when for other reasons). If current dressing causes regression in wound condition, may D/C ordered dressing product/s and apply Normal Saline Moist Dressing daily until next Wound Healing Center / Other MD appointment. Notify Wound Healing Center of regression in wound condition at [phone]. Please direct any NON-WOUND related issues/requests for orders to patient's Primary Care Physician Medications-please add to medication list.: Wound #1 Right,Distal Toe - Web between 2nd and 3rd: P.O. Antibiotics - Augmentin Wound #2 Right Toe - Web between 3rd and 4th: P.O. Antibiotics - Augmentin Wound #5 Left,Distal Toe Great: P.O. Antibiotics - Augmentin Federici, Nicki G. ([id number]) Wound #6 Left,Lateral Toe Great: P.O. Antibiotics - Augmentin Wound #7 Left Toe Third: P.O. 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 She seems to be  tolerating the pain on most days at least that's what she tells me. She uses hydrocodone not oxycodone and Saturday stated in the previous note #4 she follows up with Dr. Allyson Sabal on May 1. I [name]'t think there is an option for any sort of transmetatarsal surgery Electronic Signature(s) Signed: 10/12/2017 5:09:03 PM By: Baltazar Najjar MD Entered By: Baltazar Najjar on 10/12/2017 15:04:00 Droll, [name]  ([id number]) -------------------------------------------------------------------------------- SuperBill Details Patient Name: [name] Date of Service: 10/12/2017 Medical Record Number: [id number] Patient Account Number: [id number] Date of Birth/Sex: 11-10-1945 (72 y.o. 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

## 2017-10-19 ENCOUNTER — Ambulatory Visit: Payer: Medicare Other | Admitting: Internal Medicine

## 2017-10-19 NOTE — Progress Notes (Signed)
OVIE, EASTEP ([id number]) Visit Report for 10/12/2017 Arrival Information Details Patient Name: [name], [name] Date of Service: 10/12/2017 1:15 PM Medical Record Number: [id number] Patient Account Number: [id number] Date of Birth/Sex: Feb 11, 1946 (71 y.o. F) Treating RN: [name] Primary Care [name]: [name] Other Clinician: Referring [name]: [name] Treating Denyce [name]: [name] in Treatment: 17 Visit Information History Since Last Visit All ordered tests and consults were completed: No Patient Arrived: Wheel Chair Added or deleted any medications: No Arrival Time: 13:32 Any new allergies or adverse reactions: No Accompanied By: spouse Had a fall or experienced change in No Transfer Assistance: EasyPivot Patient activities of daily living that may affect Lift risk of falls: Patient Requires Transmission-Based No Signs or symptoms of abuse/neglect since last visito No Precautions: Hospitalized since last visit: No Patient Has Alerts: Yes Implantable device outside of the clinic excluding No Patient Alerts: Patient on Blood cellular tissue based products placed in the center Thinner since last visit: Prediabetic Eliquis Has Dressing in Place as Prescribed: Yes Pain Present Now: Yes Electronic Signature(s) Signed: 10/12/2017 4:34:44 PM By: [name] Entered By: [name] on 10/12/2017 13:33:20 [name] ([id number]) -------------------------------------------------------------------------------- Clinic Level of Care Assessment Details Patient Name: [name] Date of Service: 10/12/2017 1:15 PM Medical Record Number: [id number] Patient Account Number: [id number] Date of Birth/Sex: September 27, 1945 (71 y.o. F) Treating RN: [name] Primary Care [name]: [name] Other Clinician: Referring Benoit Meech: [name] Treating [name]: [name] in Treatment: 17 Clinic  Level of Care Assessment Items TOOL 4 Quantity Score []  - Use when only an EandM is performed on FOLLOW-UP visit 0 ASSESSMENTS - Nursing Assessment / Reassessment []  - Reassessment of Co-morbidities (includes updates in patient status) 0 X- 1 5 Reassessment of Adherence to Treatment Plan ASSESSMENTS - Wound and Skin Assessment / Reassessment []  - Simple Wound Assessment / Reassessment - one wound 0 X- 5 5 Complex Wound Assessment / Reassessment - multiple wounds []  - 0 Dermatologic / Skin Assessment (not related to wound area) ASSESSMENTS - Focused Assessment []  - Circumferential Edema Measurements - multi extremities 0 []  - 0 Nutritional Assessment / Counseling / Intervention []  - 0 Lower Extremity Assessment (monofilament, tuning fork, pulses) []  - 0 Peripheral Arterial Disease Assessment (using hand held doppler) ASSESSMENTS - Ostomy and/or Continence Assessment and Care []  - Incontinence Assessment and Management 0 []  - 0 Ostomy Care Assessment and Management (repouching, etc.) PROCESS - Coordination of Care X - Simple Patient / Family Education for ongoing care 1 15 []  - 0 Complex (extensive) Patient / Family Education for ongoing care X- 1 10 Staff obtains Chiropractor, Records, Test Results / Process Orders []  - 0 Staff telephones HHA, Nursing Homes / Clarify orders / etc []  - 0 Routine Transfer to another Facility (non-emergent condition) []  - 0 Routine Hospital Admission (non-emergent condition) []  - 0 New Admissions / Manufacturing engineer / Ordering NPWT, Apligraf, etc. []  - 0 Emergency Hospital Admission (emergent condition) X- 1 10 Simple Discharge Coordination [name], [name] ([id number]) []  - 0 Complex (extensive) Discharge Coordination PROCESS - Special Needs []  - Pediatric / Minor Patient Management 0 []  - 0 Isolation Patient Management []  - 0 Hearing / Language / Visual special needs []  - 0 Assessment of Community assistance (transportation, D/C  planning, etc.) []  - 0 Additional assistance / Altered mentation []  - 0 Support Surface(s) Assessment (bed, cushion, seat, etc.) INTERVENTIONS - Wound Cleansing / Measurement []  - Simple Wound Cleansing - one  wound 0 X- 5 5 Complex Wound Cleansing - multiple wounds X- 1 5 Wound Imaging (photographs - any number of wounds) []  - 0 Wound Tracing (instead of photographs) []  - 0 Simple Wound Measurement - one wound X- 5 5 Complex Wound Measurement - multiple wounds INTERVENTIONS - Wound Dressings []  - Small Wound Dressing one or multiple wounds 0 []  - 0 Medium Wound Dressing one or multiple wounds X- 2 20 Large Wound Dressing one or multiple wounds []  - 0 Application of Medications - topical []  - 0 Application of Medications - injection INTERVENTIONS - Miscellaneous []  - External ear exam 0 []  - 0 Specimen Collection (cultures, biopsies, blood, body fluids, etc.) []  - 0 Specimen(s) / Culture(s) sent or taken to Lab for analysis []  - 0 Patient Transfer (multiple staff / Nurse, adultHoyer Lift / Similar devices) []  - 0 Simple Staple / Suture removal (25 or less) []  - 0 Complex Staple / Suture removal (26 or more) []  - 0 Hypo / Hyperglycemic Management (close monitor of Blood Glucose) []  - 0 Ankle / Brachial Index (ABI) - do not check if billed separately X- 1 5 Vital Signs [name], [name] ([id number]) Has the patient been seen at the hospital within the last three years: Yes Total Score: 165 Level Of Care: New/Established - Level 5 Electronic Signature(s) Signed: 10/12/2017 5:11:52 PM By: Elliot GurneyWoody, BSN, RN, CWS, Kim RN, BSN Entered By: Elliot GurneyWoody, BSN, RN, CWS, Kim on 10/12/2017 14:06:45 [name], [name] ([id number]) -------------------------------------------------------------------------------- Encounter Discharge Information Details Patient Name: [name] LoaEFALCO, [name] Date of Service: 10/12/2017 1:15 PM Medical Record Number: [id number] Patient Account Number: [id number] Date of Birth/Sex:  1946-04-27 (71 y.o. F) Treating RN: [name] CoventryWoody, Kim Primary Care Alea Ryer: [name] AltoBURDINE, [name] Other Clinician: Referring Keeshia Sanderlin: [name] AltoBURDINE, [name] Treating Martyna Thorns/Extender: [name] CarolinaOBSON, MICHAEL G Weeks in Treatment: 17 Encounter Discharge Information Items Schedule Follow-up Appointment: No Medication Reconciliation completed and No provided to Patient/Care Haddon Fyfe: Provided on Clinical Summary of Care: 10/12/2017 Form Type Recipient Paper Patient MD Electronic Signature(s) Signed: 10/14/2017 3:53:00 PM By: Gwenlyn PerkingMoore, Shelia Entered By: Gwenlyn PerkingMoore, Shelia on 10/12/2017 14:25:29 Lindamood, [name] ([id number]) -------------------------------------------------------------------------------- Lower Extremity Assessment Details Patient Name: [name] LoaEFALCO, Darcel G. Date of Service: 10/12/2017 1:15 PM Medical Record Number: [id number] Patient Account Number: [id number] Date of Birth/Sex: 1946-04-27 (71 y.o. F) Treating RN: [name] CriglerFlinchum, Cheryl Primary Care Theodus Ran: [name] AltoBURDINE, [name] Other Clinician: Referring Cherie Lasalle: [name] AltoBURDINE, [name] Treating Gilford Lardizabal/Extender: [name] CarolinaOBSON, MICHAEL G Weeks in Treatment: 17 Edema Assessment Assessed: [Left: No] [Right: No] [Left: Edema] [Right: :] Calf Left: Right: Point of Measurement: 33 cm From Medial Instep 38.2 cm 39 cm Ankle Left: Right: Point of Measurement: 10 cm From Medial Instep 19.8 cm 21 cm Vascular Assessment Pulses: Dorsalis Pedis Palpable: [Left:No] [Right:Yes] Doppler Audible: [Left:Yes] [Right:Yes] Posterior Tibial Extremity colors, hair growth, and conditions: Extremity Color: [Left:Normal] [Right:Normal] Hair Growth on Extremity: [Left:Yes] [Right:Yes] Temperature of Extremity: [Left:Warm] [Right:Warm] Capillary Refill: [Left:< 3 seconds] [Right:< 3 seconds] Electronic Signature(s) Signed: 10/12/2017 4:34:44 PM By: [name] CriglerFlinchum, Cheryl Signed: 10/13/2017 5:13:37 PM By: Alejandro MullingPinkerton, Debra Entered By: Alejandro MullingPinkerton, Debra on 10/12/2017 13:52:12 Manuelito, [name]  ([id number]) -------------------------------------------------------------------------------- Multi Wound Chart Details Patient Name: [name] LoaEFALCO, Denecia G. Date of Service: 10/12/2017 1:15 PM Medical Record Number: [id number] Patient Account Number: [id number] Date of Birth/Sex: 1946-04-27 (71 y.o. F) Treating RN: [name] CoventryWoody, Kim Primary Care Ryna Beckstrom: [name] AltoBURDINE, [name] Other Clinician: Referring Frederica Chrestman: [name] AltoBURDINE, [name] Treating Virgel Haro/Extender: [name] CarolinaOBSON, MICHAEL G Weeks in Treatment: 17 Vital Signs Height(in): 63 Pulse(bpm): 101 Weight(lbs): 176 Blood Pressure(mmHg): 99/68 Body Mass Index(BMI): 31 Temperature(F): 98.4 Respiratory  Rate 18 (breaths/min): Photos: [1:No Photos] [2:No Photos] [4:No Photos] Wound Location: [1:Right Toe - Web between 2nd and 3rd - Distal] [2:Right Toe - Web between 3rd and 4th] [4:Right, Dorsal Toe Fifth] Wounding Event: [1:Gradually Appeared] [2:Pressure Injury] [4:Gradually Appeared] Primary Etiology: [1:Arterial Insufficiency Ulcer] [2:Arterial Insufficiency Ulcer] [4:Arterial Insufficiency Ulcer] Comorbid History: [1:Anemia, Arrhythmia, Congestive Heart Failure, Hypertension, Myocardial Infarction, Peripheral Arterial Disease, Neuropathy] [2:Anemia, Arrhythmia, Congestive Heart Failure, Hypertension, Myocardial Infarction, Peripheral Arterial  Disease, Neuropathy] [4:N/A] Date Acquired: [1:05/30/2017] [2:02/21/2017] [4:07/19/2017] Weeks of Treatment: [1:17] [2:16] [4:12] Wound Status: [1:Open] [2:Open] [4:Healed - Epithelialized] Clustered Wound: [1:Yes] [2:No] [4:No] Pending Amputation on [1:Yes] [2:No] [4:No] Presentation: Measurements L x W x D [1:0.8x0.5x0.1] [2:0.5x0.5x0.1] [4:0x0x0] (cm) Area (cm) : [1:0.314] [2:0.196] [4:0] Volume (cm) : [1:0.031] [2:0.02] [4:0] % Reduction in Area: [1:98.80%] [2:87.00%] [4:100.00%] % Reduction in Volume: [1:98.80%] [2:86.80%] [4:100.00%] Classification: [1:Full Thickness Without Exposed Support Structures]  [2:Partial Thickness] [4:Partial Thickness] Exudate Amount: [1:Large] [2:Small] [4:N/A] Exudate Type: [1:Serous] [2:Serous] [4:N/A] Exudate Color: [1:amber] [2:amber] [4:N/A] Wound Margin: [1:Flat and Intact] [2:Flat and Intact] [4:N/A] Granulation Amount: [1:Large (67-100%)] [2:None Present (0%)] [4:N/A] Granulation Quality: [1:Red] [2:N/A] [4:N/A] Necrotic Amount: [1:None Present (0%)] [2:Large (67-100%)] [4:N/A] Necrotic Tissue: [1:N/A] [2:Eschar] [4:N/A] Exposed Structures: [1:Fascia: No Fat Layer (Subcutaneous Tissue) Exposed: No Tendon: No Muscle: No] [2:Fascia: No Fat Layer (Subcutaneous Tissue) Exposed: No Tendon: No Muscle: No] [4:N/A] Joint: No Joint: No Bone: No Bone: No Epithelialization: None None N/A Periwound Skin Texture: Excoriation: No Excoriation: No No Abnormalities Noted Induration: No Induration: No Callus: No Callus: No Crepitus: No Crepitus: No Rash: No Rash: No Scarring: No Scarring: No Periwound Skin Moisture: Maceration: No Maceration: No No Abnormalities Noted Dry/Scaly: No Dry/Scaly: No Periwound Skin Color: Atrophie Blanche: No Cyanosis: Yes No Abnormalities Noted Cyanosis: No Atrophie Blanche: No Ecchymosis: No Ecchymosis: No Erythema: No Erythema: No Hemosiderin Staining: No Hemosiderin Staining: No Mottled: No Mottled: No Pallor: No Pallor: No Rubor: No Rubor: No Temperature: No Abnormality No Abnormality N/A Tenderness on Palpation: Yes Yes No Wound Preparation: Ulcer Cleansing: Ulcer Cleansing: N/A Rinsed/Irrigated with Saline Rinsed/Irrigated with Saline Topical Anesthetic Applied: Topical Anesthetic Applied: Other: lidocaine 4% Other: lidocaine 4% Wound Number: [id number]  Photos: No Photos No Photos No Photos Wound Location: Left Toe Great - Distal Left Toe Great - Lateral Left Toe Third Wounding Event: Gradually Appeared Gradually Appeared Gradually Appeared Primary Etiology: Arterial Insufficiency Ulcer Arterial  Insufficiency Ulcer Arterial Insufficiency Ulcer Comorbid History: Anemia, Arrhythmia, Anemia, Arrhythmia, Anemia, Arrhythmia, Congestive Heart Failure, Congestive Heart Failure, Congestive Heart Failure, Hypertension, Myocardial Hypertension, Myocardial Hypertension, Myocardial Infarction, Peripheral Arterial Infarction, Peripheral Arterial Infarction, Peripheral Arterial Disease, Neuropathy Disease, Neuropathy Disease, Neuropathy Date Acquired: 07/17/2017 07/17/2017 07/16/2017 Weeks of Treatment: 12 12 12  Wound Status: Open Open Open Clustered Wound: No No No Pending Amputation on No No No Presentation: Measurements L x W x D 2.4x2.5x0.1 1.2x0.8x0.1 1.6x1.8x0.3 (cm) Area (cm) : 4.712 0.754 2.262 Volume (cm) : 0.471 0.075 0.679 % Reduction in Area: -199.90% 75.80% -200.00% % Reduction in Volume: -2843.70% 75.90% -805.30% Classification: Partial Thickness Partial Thickness Partial Thickness Exudate Amount: Large Large Large Exudate Type: Serous Serous Purulent Exudate Color: amber amber yellow, brown, green Wound Margin: Flat and Intact Flat and Intact Flat and Intact Granulation Amount: None Present (0%) None Present (0%) None Present (0%) Granulation Quality: N/A N/A N/A Necrotic Amount: Large (67-100%) Large (67-100%) Large (67-100%) Janosik, [name] GMarland Kitchen ([id number]) Necrotic Tissue: Eschar, Adherent Slough Adherent [employer], Adherent Slough Exposed Structures: Fascia: No Fascia: No Fascia:  No Fat Layer (Subcutaneous Fat Layer (Subcutaneous Fat Layer (Subcutaneous Tissue) Exposed: No Tissue) Exposed: No Tissue) Exposed: No Tendon: No Tendon: No Tendon: No Muscle: No Muscle: No Muscle: No Joint: No Joint: No Joint: No Bone: No Bone: No Bone: No Epithelialization: None None None Periwound Skin Texture: Excoriation: No Excoriation: No Excoriation: No Induration: No Induration: No Induration: No Callus: No Callus: No Callus: No Crepitus: No Crepitus:  No Crepitus: No Rash: No Rash: No Rash: No Scarring: No Scarring: No Scarring: No Periwound Skin Moisture: Maceration: No Maceration: No Maceration: No Dry/Scaly: No Dry/Scaly: No Dry/Scaly: No Periwound Skin Color: Cyanosis: Yes Cyanosis: Yes Cyanosis: Yes Atrophie Blanche: No Atrophie Blanche: No Atrophie Blanche: No Ecchymosis: No Ecchymosis: No Ecchymosis: No Erythema: No Erythema: No Erythema: No Hemosiderin Staining: No Hemosiderin Staining: No Hemosiderin Staining: No Mottled: No Mottled: No Mottled: No Pallor: No Pallor: No Pallor: No Rubor: No Rubor: No Rubor: No Temperature: No Abnormality No Abnormality No Abnormality Tenderness on Palpation: Yes Yes Yes Wound Preparation: Ulcer Cleansing: Ulcer Cleansing: Ulcer Cleansing: Rinsed/Irrigated with Saline Rinsed/Irrigated with Saline Rinsed/Irrigated with Saline Topical Anesthetic Applied: Topical Anesthetic Applied: Topical Anesthetic Applied: Other: lidocaine 4% Other: lidocaine 4% Other: lidocaine 4% Treatment Notes Electronic Signature(s) Signed: 10/12/2017 5:09:03 PM By: Baltazar Najjar MD Entered By: Baltazar Najjar on 10/12/2017 14:52:53 Nilsen, [name] ([id number]) -------------------------------------------------------------------------------- Multi-Disciplinary Care Plan Details Patient Name: [name] Date of Service: 10/12/2017 1:15 PM Medical Record Number: [id number] Patient Account Number: [id number] Date of Birth/Sex: 1945-10-20 (71 y.o. F) Treating RN: [name] Primary Care Timoty Bourke: [name] Other Clinician: Referring Char Feltman: [name] Treating Vidur Knust/Extender: [name] [place] in Treatment: 17 Active Inactive ` Abuse / Safety / Falls / Self Care Management Nursing Diagnoses: History of Falls Impaired home maintenance Potential for falls Goals: Patient will not experience any injury related to falls Date Initiated: 06/13/2017 Target  Resolution Date: 07/14/2017 Goal Status: Active Patient will remain injury free related to falls Date Initiated: 06/13/2017 Target Resolution Date: 07/14/2017 Goal Status: Active Interventions: Assess fall risk on admission and as needed Treatment Activities: Patient referred to home care : 06/13/2017 Notes: ` Necrotic Tissue Nursing Diagnoses: Impaired tissue integrity related to necrotic/devitalized tissue Goals: Necrotic/devitalized tissue will be minimized in the wound bed Date Initiated: 06/13/2017 Target Resolution Date: 07/14/2017 Goal Status: Active Interventions: Assess patient pain level pre-, during and post procedure and prior to discharge Provide education on necrotic tissue and debridement process Treatment Activities: Apply topical anesthetic as ordered : 06/13/2017 Notes: Sheldon, [name] ([id number]) ` Orientation to the Wound Care Program Nursing Diagnoses: Knowledge deficit related to the wound healing center program Goals: Patient/caregiver will verbalize understanding of the Wound Healing Center Program Date Initiated: 06/13/2017 Target Resolution Date: 07/14/2017 Goal Status: Active Interventions: Provide education on orientation to the wound center Notes: ` Wound/Skin Impairment Nursing Diagnoses: Impaired tissue integrity Knowledge deficit related to ulceration/compromised skin integrity Goals: Ulcer/skin breakdown will have a volume reduction of 30% by week 4 Date Initiated: 06/13/2017 Target Resolution Date: 07/14/2017 Goal Status: Active Interventions: Assess ulceration(s) every visit Treatment Activities: Patient referred to home care : 06/13/2017 Topical wound management initiated : 06/13/2017 Notes: Electronic Signature(s) Signed: 10/12/2017 5:11:52 PM By: Elliot Gurney, BSN, RN, CWS, Kim RN, BSN Entered By: Elliot Gurney, BSN, RN, CWS, Kim on 10/12/2017 14:01:30 Faniel, [name]  ([id number]) -------------------------------------------------------------------------------- Pain Assessment Details Patient Name: [name] Date of Service: 10/12/2017 1:15 PM Medical Record Number: [id number] Patient Account Number: [id number] Date of Birth/Sex: 1946/01/12 (71 y.o.  F) Treating RN: [name] Primary Care Barbar Brede: [name] Other Clinician: Referring Kinnie Kaupp: [name] Treating Adrea Sherpa/Extender: [name] [place] in Treatment: 17 Active Problems Location of Pain Severity and Description of Pain Patient Has Paino Yes Site Locations Pain Location: Pain in Ulcers Rate the pain. Current Pain Level: 9 Character of Pain Describe the Pain: Aching Pain Management and Medication Current Pain Management: Notes Topical or injectable lidocaine is offered to patient for acute pain when surgical debridement is performed. If needed, Patient is instructed to use over the counter pain medication for the following 24-48 hours after debridement. Wound care MDs do not prescribed pain medications. Patient has chronic pain or uncontrolled pain. Patient has been instructed to make an appointment with their Primary Care Physician for pain management. Electronic Signature(s) Signed: 10/12/2017 4:34:44 PM By: [name] Entered By: [name] on 10/12/2017 13:34:01 Hoot, [name] ([id number]) -------------------------------------------------------------------------------- Wound Assessment Details Patient Name: [name] Date of Service: 10/12/2017 1:15 PM Medical Record Number: [id number] Patient Account Number: [id number] Date of Birth/Sex: 10-17-1945 (71 y.o. F) Treating RN: [name] Primary Care Kandice Schmelter: [name] Other Clinician: Referring Greely Atiyeh: [name] Treating Isador Castille/Extender: [name] [place] in Treatment: 17 Wound Status Wound Number: 1 Primary Arterial Insufficiency  Ulcer Etiology: Wound Location: Right Toe - Web between 2nd and 3rd - Distal Wound Open Status: Wounding Event: Gradually Appeared Comorbid Anemia, Arrhythmia, Congestive Heart Failure, Date Acquired: 05/30/2017 History: Hypertension, Myocardial Infarction, Peripheral Weeks Of Treatment: 17 Arterial Disease, Neuropathy Clustered Wound: Yes Pending Amputation On Presentation Photos Photo Uploaded By: [name] on 10/12/2017 16:11:58 Wound Measurements Length: (cm) 0.8 Width: (cm) 0.5 Depth: (cm) 0.1 Area: (cm) 0.314 Volume: (cm) 0.031 % Reduction in Area: 98.8% % Reduction in Volume: 98.8% Epithelialization: None Tunneling: No Undermining: No Wound Description Full Thickness Without Exposed Support Classification: Structures Wound Margin: Flat and Intact Exudate Large Amount: Exudate Type: Serous Exudate Color: amber Foul Odor After Cleansing: No Slough/Fibrino No Wound Bed Granulation Amount: Large (67-100%) Exposed Structure Granulation Quality: Red Fascia Exposed: No Necrotic Amount: None Present (0%) Fat Layer (Subcutaneous Tissue) Exposed: No Tendon Exposed: No Muscle Exposed: No Joint Exposed: No Licklider, [name] ([id number]) Bone Exposed: No Periwound Skin Texture Texture Color No Abnormalities Noted: No No Abnormalities Noted: No Callus: No Atrophie Blanche: No Crepitus: No Cyanosis: No Excoriation: No Ecchymosis: No Induration: No Erythema: No Rash: No Hemosiderin Staining: No Scarring: No Mottled: No Pallor: No Moisture Rubor: No No Abnormalities Noted: No Dry / Scaly: No Temperature / Pain Maceration: No Temperature: No Abnormality Tenderness on Palpation: Yes Wound Preparation Ulcer Cleansing: Rinsed/Irrigated with Saline Topical Anesthetic Applied: Other: lidocaine 4%, Electronic Signature(s) Signed: 10/12/2017 4:34:44 PM By: [name] Entered By: [name] on 10/12/2017 13:40:13 Flitton, [name]  ([id number]) -------------------------------------------------------------------------------- Wound Assessment Details Patient Name: [name] Date of Service: 10/12/2017 1:15 PM Medical Record Number: [id number] Patient Account Number: [id number] Date of Birth/Sex: 06/23/1946 (71 y.o. F) Treating RN: [name] Primary Care Latorria Zeoli: [name] Other Clinician: Referring Amilee Janvier: [name] Treating Deliah Strehlow/Extender: [name] Denham in Treatment: 17 Wound Status Wound Number: 2 Primary Arterial Insufficiency Ulcer Etiology: Wound Location: Right Toe - Web between 3rd and 4th Wound Open Wounding Event: Pressure Injury Status: Date Acquired: 02/21/2017 Comorbid Anemia, Arrhythmia, Congestive Heart Failure, Weeks Of Treatment: 16 History: Hypertension, Myocardial Infarction, Peripheral Clustered Wound: No Arterial Disease, Neuropathy Photos Photo Uploaded By: [name] on 10/12/2017 16:11:59 Wound Measurements Length: (cm) 0.5 Width: (cm) 0.5 Depth: (  cm) 0.1 Area: (cm) 0.196 Volume: (cm) 0.02 % Reduction in Area: 87% % Reduction in Volume: 86.8% Epithelialization: None Tunneling: No Undermining: No Wound Description Classification: Partial Thickness Wound Margin: Flat and Intact Exudate Amount: Small Exudate Type: Serous Exudate Color: amber Foul Odor After Cleansing: No Slough/Fibrino Yes Wound Bed Granulation Amount: None Present (0%) Exposed Structure Necrotic Amount: Large (67-100%) Fascia Exposed: No Necrotic Quality: Eschar Fat Layer (Subcutaneous Tissue) Exposed: No Tendon Exposed: No Muscle Exposed: No Joint Exposed: No Bone Exposed: No Periwound Skin Texture Welsch, [name] ([id number]) Texture Color No Abnormalities Noted: No No Abnormalities Noted: No Callus: No Atrophie Blanche: No Crepitus: No Cyanosis: Yes Excoriation: No Ecchymosis: No Induration: No Erythema: No Rash: No Hemosiderin Staining:  No Scarring: No Mottled: No Pallor: No Moisture Rubor: No No Abnormalities Noted: No Dry / Scaly: No Temperature / Pain Maceration: No Temperature: No Abnormality Tenderness on Palpation: Yes Wound Preparation Ulcer Cleansing: Rinsed/Irrigated with Saline Topical Anesthetic Applied: Other: lidocaine 4%, Electronic Signature(s) Signed: 10/12/2017 4:34:44 PM By: [name] Entered By: [name] on 10/12/2017 13:41:27 Guerreiro, [name] ([id number]) -------------------------------------------------------------------------------- Wound Assessment Details Patient Name: [name] Date of Service: 10/12/2017 1:15 PM Medical Record Number: [id number] Patient Account Number: [id number] Date of Birth/Sex: [birth year] (71 y.o. F) Treating RN: [name] Primary Care Dereonna Lensing: [name] Other Clinician: Referring Markeem Noreen: [name] Treating Jahlani Lorentz/Extender: [name] [place] in Treatment: 17 Wound Status Wound Number: 4 Primary Etiology: Arterial Insufficiency Ulcer Wound Location: Right, Dorsal Toe Fifth Wound Status: Healed - Epithelialized Wounding Event: Gradually Appeared Date Acquired: 07/19/2017 Weeks Of Treatment: 12 Clustered Wound: No Photos Photo Uploaded By: [name] on 10/12/2017 16:13:13 Wound Measurements Length: (cm) 0 Width: (cm) 0 Depth: (cm) 0 Area: (cm) 0 Volume: (cm) 0 % Reduction in Area: 100% % Reduction in Volume: 100% Wound Description Classification: Partial Thickness Periwound Skin Texture Texture Color No Abnormalities Noted: No No Abnormalities Noted: No Moisture No Abnormalities Noted: No Electronic Signature(s) Signed: 10/12/2017 4:34:44 PM By: [name] Entered By: [name] on 10/12/2017 13:38:59 Portillo, [name] ([id number]) -------------------------------------------------------------------------------- Wound Assessment Details Patient Name: [name] Date of  Service: 10/12/2017 1:15 PM Medical Record Number: [id number] Patient Account Number: [id number] Date of Birth/Sex: [birth year] (71 y.o. F) Treating RN: [name] Primary Care Neesa Knapik: [name] Other Clinician: Referring Maelee Hoot: [name] Treating Matilyn Fehrman/Extender: [name] [place] in Treatment: 17 Wound Status Wound Number: 5 Primary Arterial Insufficiency Ulcer Etiology: Wound Location: Left Toe Great - Distal Wound Open Wounding Event: Gradually Appeared Status: Date Acquired: 07/17/2017 Comorbid Anemia, Arrhythmia, Congestive Heart Failure, Weeks Of Treatment: 12 History: Hypertension, Myocardial Infarction, Peripheral Clustered Wound: No Arterial Disease, Neuropathy Photos Photo Uploaded By: [name] on 10/12/2017 16:13:13 Wound Measurements Length: (cm) 2.4 Width: (cm) 2.5 Depth: (cm) 0.1 Area: (cm) 4.712 Volume: (cm) 0.471 % Reduction in Area: -199.9% % Reduction in Volume: -2843.7% Epithelialization: None Tunneling: No Undermining: No Wound Description Classification: Partial Thickness Wound Margin: Flat and Intact Exudate Amount: Large Exudate Type: Serous Exudate Color: amber Foul Odor After Cleansing: No Slough/Fibrino Yes Wound Bed Granulation Amount: None Present (0%) Exposed Structure Necrotic Amount: Large (67-100%) Fascia Exposed: No Necrotic Quality: Eschar, Adherent Slough Fat Layer (Subcutaneous Tissue) Exposed: No Tendon Exposed: No Muscle Exposed: No Joint Exposed: No Bone Exposed: No Periwound Skin Texture Barlett, [name] ([id number]) Texture Color No Abnormalities Noted: No No Abnormalities Noted: No Callus: No Atrophie Blanche: No Crepitus: No Cyanosis: Yes Excoriation: No Ecchymosis: No Induration: No Erythema:  No Rash: No Hemosiderin Staining: No Scarring: No Mottled: No Pallor: No Moisture Rubor: No No Abnormalities Noted: No Dry / Scaly: No Temperature / Pain Maceration:  No Temperature: No Abnormality Tenderness on Palpation: Yes Wound Preparation Ulcer Cleansing: Rinsed/Irrigated with Saline Topical Anesthetic Applied: Other: lidocaine 4%, Electronic Signature(s) Signed: 10/12/2017 4:34:44 PM By: [name] Entered By: [name] on 10/12/2017 13:42:43 Horsman, [name] ([id number]) -------------------------------------------------------------------------------- Wound Assessment Details Patient Name: [name] Date of Service: 10/12/2017 1:15 PM Medical Record Number: [id number] Patient Account Number: [id number] Date of Birth/Sex: [birth year] (71 y.o. F) Treating RN: [name] Primary Care Simon Llamas: [name] Other Clinician: Referring Cadin Luka: [name] Treating Gracey Tolle/Extender: [name] [place] in Treatment: 17 Wound Status Wound Number: 6 Primary Arterial Insufficiency Ulcer Etiology: Wound Location: Left Toe Great - Lateral Wound Open Wounding Event: Gradually Appeared Status: Date Acquired: 07/17/2017 Comorbid Anemia, Arrhythmia, Congestive Heart Failure, Weeks Of Treatment: 12 History: Hypertension, Myocardial Infarction, Peripheral Clustered Wound: No Arterial Disease, Neuropathy Photos Photo Uploaded By: [name] on 10/12/2017 16:13:59 Wound Measurements Length: (cm) 1.2 Width: (cm) 0.8 Depth: (cm) 0.1 Area: (cm) 0.754 Volume: (cm) 0.075 % Reduction in Area: 75.8% % Reduction in Volume: 75.9% Epithelialization: None Wound Description Classification: Partial Thickness Wound Margin: Flat and Intact Exudate Amount: Large Exudate Type: Serous Exudate Color: amber Foul Odor After Cleansing: No Slough/Fibrino Yes Wound Bed Granulation Amount: None Present (0%) Exposed Structure Necrotic Amount: Large (67-100%) Fascia Exposed: No Necrotic Quality: Adherent Slough Fat Layer (Subcutaneous Tissue) Exposed: No Tendon Exposed: No Muscle Exposed: No Joint Exposed: No Bone  Exposed: No Periwound Skin Texture Hendricks, [name] ([id number]) Texture Color No Abnormalities Noted: No No Abnormalities Noted: No Callus: No Atrophie Blanche: No Crepitus: No Cyanosis: Yes Excoriation: No Ecchymosis: No Induration: No Erythema: No Rash: No Hemosiderin Staining: No Scarring: No Mottled: No Pallor: No Moisture Rubor: No No Abnormalities Noted: No Dry / Scaly: No Temperature / Pain Maceration: No Temperature: No Abnormality Tenderness on Palpation: Yes Wound Preparation Ulcer Cleansing: Rinsed/Irrigated with Saline Topical Anesthetic Applied: Other: lidocaine 4%, Electronic Signature(s) Signed: 10/12/2017 4:34:44 PM By: [name] Entered By: [name] on 10/12/2017 13:43:11 Gillen, [name] ([id number]) -------------------------------------------------------------------------------- Wound Assessment Details Patient Name: [name] Date of Service: 10/12/2017 1:15 PM Medical Record Number: [id number] Patient Account Number: [id number] Date of Birth/Sex: 09-Sep-1945 (71 y.o. F) Treating RN: [name] Primary Care Neeya Prigmore: [name] Other Clinician: Referring Marlynn Hinckley: [name] Treating Kawhi Diebold/Extender: [name] [place] in Treatment: 17 Wound Status Wound Number: 7 Primary Arterial Insufficiency Ulcer Etiology: Wound Location: Left Toe Third Wound Open Wounding Event: Gradually Appeared Status: Date Acquired: 07/16/2017 Comorbid Anemia, Arrhythmia, Congestive Heart Failure, Weeks Of Treatment: 12 History: Hypertension, Myocardial Infarction, Peripheral Clustered Wound: No Arterial Disease, Neuropathy Photos Photo Uploaded By: [name] on 10/12/2017 16:14:00 Wound Measurements Length: (cm) 1.6 Width: (cm) 1.8 Depth: (cm) 0.3 Area: (cm) 2.262 Volume: (cm) 0.679 % Reduction in Area: -200% % Reduction in Volume: -805.3% Epithelialization: None Tunneling: No Undermining: No Wound  Description Classification: Partial Thickness Wound Margin: Flat and Intact Exudate Amount: Large Exudate Type: Purulent Exudate Color: yellow, brown, green Foul Odor After Cleansing: No Slough/Fibrino Yes Wound Bed Granulation Amount: None Present (0%) Exposed Structure Necrotic Amount: Large (67-100%) Fascia Exposed: No Necrotic Quality: Eschar, Adherent Slough Fat Layer (Subcutaneous Tissue) Exposed: No Tendon Exposed: No Muscle Exposed: No Joint Exposed: No Bone Exposed: No Periwound Skin Texture Brower, [name] ([id number]) Texture Color No Abnormalities Noted: No No Abnormalities  Noted: No Callus: No Atrophie Blanche: No Crepitus: No Cyanosis: Yes Excoriation: No Ecchymosis: No Induration: No Erythema: No Rash: No Hemosiderin Staining: No Scarring: No Mottled: No Pallor: No Moisture Rubor: No No Abnormalities Noted: No Dry / Scaly: No Temperature / Pain Maceration: No Temperature: No Abnormality Tenderness on Palpation: Yes Wound Preparation Ulcer Cleansing: Rinsed/Irrigated with Saline Topical Anesthetic Applied: Other: lidocaine 4%, Electronic Signature(s) Signed: 10/12/2017 4:34:44 PM By: [name] Entered By: [name] on 10/12/2017 13:45:43 Sano, [name] ([id number]) -------------------------------------------------------------------------------- Vitals Details Patient Name: [name] Date of Service: 10/12/2017 1:15 PM Medical Record Number: [id number] Patient Account Number: [id number] Date of Birth/Sex: 01-Jan-1946 (72 y.o. F) Treating RN: [name] Primary Care Richards Pherigo: [name] Other Clinician: Referring Sidonie Dexheimer: [name] Treating Issaiah Seabrooks/Extender: [name] [place] in Treatment: 17 Vital Signs Time Taken: 13:34 Temperature (F): 98.4 Height (in): 63 Pulse (bpm): 101 Weight (lbs): 176 Respiratory Rate (breaths/min): 18 Body Mass Index (BMI): 31.2 Blood Pressure (mmHg):  99/68 Reference Range: 80 - 120 mg / dl Electronic Signature(s) Signed: 10/12/2017 4:34:44 PM By: [name] Entered By: [name] on 10/12/2017 13:34:40

## 2017-10-20 ENCOUNTER — Encounter (INDEPENDENT_AMBULATORY_CARE_PROVIDER_SITE_OTHER): Payer: Self-pay | Admitting: Internal Medicine

## 2017-10-20 ENCOUNTER — Ambulatory Visit (INDEPENDENT_AMBULATORY_CARE_PROVIDER_SITE_OTHER): Payer: Medicare Other | Admitting: Internal Medicine

## 2017-10-20 VITALS — BP 132/80 | HR 72 | Temp 97.9°F | Ht 63.0 in | Wt 150.4 lb

## 2017-10-20 DIAGNOSIS — D649 Anemia, unspecified: Secondary | ICD-10-CM

## 2017-10-20 DIAGNOSIS — D508 Other iron deficiency anemias: Secondary | ICD-10-CM | POA: Diagnosis not present

## 2017-10-20 HISTORY — DX: Anemia, unspecified: D64.9

## 2017-10-20 LAB — TSH: TSH: 21.59 mIU/L — ABNORMAL HIGH (ref 0.40–4.50)

## 2017-10-20 LAB — HEMOGLOBIN AND HEMATOCRIT, BLOOD
HEMATOCRIT: 36.7 % (ref 35.0–45.0)
HEMOGLOBIN: 12.3 g/dL (ref 11.7–15.5)

## 2017-10-20 LAB — IRON,TIBC AND FERRITIN PANEL
%SAT: 27 % (calc) (ref 11–50)
Ferritin: 52 ng/mL (ref 20–288)
IRON: 87 ug/dL (ref 45–160)
TIBC: 319 mcg/dL (calc) (ref 250–450)

## 2017-10-20 LAB — T4, FREE: FREE T4: 1.6 ng/dL (ref 0.8–1.8)

## 2017-10-20 LAB — TEST AUTHORIZATION

## 2017-10-20 NOTE — Progress Notes (Signed)
Cardiology Office Note  Date: 10/24/2017   ID: [name], DOB 03-28-1946, MRN [id number]  PCP: Juliette Alcide, MD  Primary Cardiologist: Nona Dell, MD   Chief Complaint  Patient presents with  . Cardiomyopathy  . Coronary Artery Disease    History of Present Illness: [name] is a 72 y.o. female last seen in February.  Interval follow-up noted with Dr. Gala Romney in the heart failure clinic and also Dr. Allyson Sabal.  She is here today with her husband.  She states that she is slowly improving in terms of her leg pain/PAD.  She has had a number of interventions and continues to follow closely with Dr. Allyson Sabal.  Reports improvement in pain and no longer on regular analgesics.  She has not been eating well, her weight is down significantly.  She has already cut Demadex back to 20 mg 2 tablets in the morning.  She did not cut back her potassium.  I reviewed recent lab work.  Hemoglobin back up to normal.  TSH elevated at 21.59 with T4 1.6.  States that she had not been taking her Synthroid regularly, but she is also on amiodarone.  She does not report any palpitations or angina symptoms.  Heart rate has been controlled in atrial fibrillation/flutter.  She maintains Eliquis and Plavix.  Today we reviewed her medications and discussed adjustments.  She has follow-up with Dr. Gala Romney in June.  Past Medical History:  Diagnosis Date  . Anemia 10/20/2017  . Asthma   . CAD (coronary artery disease)    Multivessel status post CABG in 2003  . Chronic combined systolic (congestive) and diastolic (congestive) heart failure (HCC) 12/25/2016  . Chronic systolic heart failure (HCC)   . Essential hypertension   . GERD (gastroesophageal reflux disease)   . Gum disease   . History of atrial flutter    Cardioversion 2016 in [place]  . History of colonic polyps   . History of goiter   . Hyperlipidemia   . Hypothyroidism   . Ischemic cardiomyopathy 12/25/2016  . Myocardial infarction  (HCC)    2003  . Peripheral arterial disease (HCC)    Stent revascularization 2015 - details not clear  . Renal insufficiency   . Type 2 diabetes mellitus (HCC)     Past Surgical History:  Procedure Laterality Date  . ABDOMINAL AORTOGRAM W/LOWER EXTREMITY Right 05/30/2017   Procedure: ABDOMINAL AORTOGRAM W/LOWER EXTREMITY;  Surgeon: Runell Gess, MD;  Location: [facility] INVASIVE CV LAB;  Service: Cardiovascular;  Laterality: Right;  . ABDOMINAL AORTOGRAM W/LOWER EXTREMITY N/A 07/14/2017   Procedure: ABDOMINAL AORTOGRAM W/LOWER EXTREMITY;  Surgeon: Fransisco Hertz, MD;  Location: [facility] INVASIVE CV LAB;  Service: Cardiovascular;  Laterality: N/A;  . CARDIAC CATHETERIZATION    . CARDIOVERSION N/A 04/12/2017   Procedure: CARDIOVERSION;  Surgeon: Dolores Patty, MD;  Location: [facility] ENDOSCOPY;  Service: Cardiovascular;  Laterality: N/A;  . CARDIOVERSION N/A 05/02/2017   Procedure: CARDIOVERSION;  Surgeon: Dolores Patty, MD;  Location: [facility] ENDOSCOPY;  Service: Cardiovascular;  Laterality: N/A;  . CHOLECYSTECTOMY     2012  . CORONARY ARTERY BYPASS GRAFT     2003  . LOWER EXTREMITY ANGIOGRAPHY Left 08/01/2017   Procedure: LOWER EXTREMITY ANGIOGRAPHY;  Surgeon: Runell Gess, MD;  Location: [facility] INVASIVE CV LAB;  Service: Cardiovascular;  Laterality: Left;  . PARATHYROIDECTOMY     2015  . PERIPHERAL VASCULAR BALLOON ANGIOPLASTY Left 08/01/2017   Procedure: PERIPHERAL VASCULAR BALLOON ANGIOPLASTY;  Surgeon: Nanetta Batty  J, MD;  Location: [facility] INVASIVE CV LAB;  Service: Cardiovascular;  Laterality: Left;  attempted  . PERIPHERAL VASCULAR INTERVENTION Right 05/30/2017  . PERIPHERAL VASCULAR INTERVENTION Right 05/30/2017   Procedure: PERIPHERAL VASCULAR INTERVENTION;  Surgeon: Runell Gess, MD;  Location: [facility] INVASIVE CV LAB;  Service: Cardiovascular;  Laterality: Right;  . PERIPHERAL VASCULAR INTERVENTION Left 08/01/2017   Procedure: PERIPHERAL VASCULAR INTERVENTION;  Surgeon: Runell Gess,  MD;  Location: [facility] INVASIVE CV LAB;  Service: Cardiovascular;  Laterality: Left;  Peroneal stent left  . RIGHT/LEFT HEART CATH AND CORONARY/GRAFT ANGIOGRAPHY N/A 04/08/2017   Procedure: RIGHT/LEFT HEART CATH AND CORONARY/GRAFT ANGIOGRAPHY;  Surgeon: Corky Crafts, MD;  Location: [facility] INVASIVE CV LAB;  Service: Cardiovascular;  Laterality: N/A;  . TEE WITHOUT CARDIOVERSION N/A 04/12/2017   Procedure: TRANSESOPHAGEAL ECHOCARDIOGRAM (TEE);  Surgeon: Dolores Patty, MD;  Location: [facility] ENDOSCOPY;  Service: Cardiovascular;  Laterality: N/A;  . THYROIDECTOMY    . TONSILLECTOMY     1967  . ULTRASOUND GUIDANCE FOR VASCULAR ACCESS  04/08/2017   Procedure: Ultrasound Guidance For Vascular Access;  Surgeon: Corky Crafts, MD;  Location: [facility] INVASIVE CV LAB;  Service: Cardiovascular;;  . ULTRASOUND GUIDANCE FOR VASCULAR ACCESS  07/14/2017   Procedure: Ultrasound Guidance For Vascular Access;  Surgeon: Fransisco Hertz, MD;  Location: [facility] INVASIVE CV LAB;  Service: Cardiovascular;;    Current Outpatient Medications  Medication Sig Dispense Refill  . acetaminophen (TYLENOL) 500 MG tablet Take 1,000 mg by mouth daily as needed for moderate pain.     Marland Kitchen albuterol (PROVENTIL HFA;VENTOLIN HFA) 108 (90 Base) MCG/ACT inhaler Inhale 2 puffs into the lungs every 6 (six) hours as needed for wheezing or shortness of breath. 1 Inhaler 2  . apixaban (ELIQUIS) 5 MG TABS tablet Take 1 tablet (5 mg total) by mouth 2 (two) times daily. 60 tablet 8  . carvedilol (COREG) 6.25 MG tablet Take 1 tablet (6.25 mg total) by mouth 2 (two) times daily. (Patient taking differently: Take 3.25 mg by mouth 2 (two) times daily. ) 60 tablet 3  . clopidogrel (PLAVIX) 75 MG tablet Take 1 tablet (75 mg total) by mouth daily. 30 tablet 11  . levothyroxine (SYNTHROID, LEVOTHROID) 175 MCG tablet Take 175 mcg by mouth daily before breakfast.    . losartan (COZAAR) 25 MG tablet Take 0.5 tablets (12.5 mg total) by mouth daily. 30 tablet 6    . Magnesium 250 MG TABS Take 250 mg by mouth daily.     . magnesium hydroxide (MILK OF MAGNESIA) 400 MG/5ML suspension Take 30 mLs by mouth daily as needed for mild constipation.    . nitroGLYCERIN (NITRODUR - DOSED IN MG/24 HR) 0.2 mg/hr patch PLACE 1 PATCH ONTO THE SKIN DAILY AS NEEDED FOR CHEST PAINS 90 patch 3  . pantoprazole (PROTONIX) 40 MG tablet TAKE 1 TABLET(40 MG) BY MOUTH DAILY 30 tablet 10  . potassium chloride (K-DUR,KLOR-CON) 10 MEQ tablet Take 2 tablets (20 mEq total) by mouth daily. 120 tablet 6  . torsemide (DEMADEX) 20 MG tablet Take 3 tablets (60 mg total) by mouth 2 (two) times daily. (Patient taking differently: Take 40 mg by mouth daily. ) 180 tablet 6   No current facility-administered medications for this visit.    Allergies:  Oxycodone; Pollen extract; Ranexa [ranolazine]; and Statins   Social History: The patient  reports that she quit smoking about 31 years ago. Her smoking use included cigarettes. She has a 21.00 pack-year smoking history. She has never used  smokeless tobacco. She reports that she does not drink alcohol or use drugs.   ROS:  Please see the history of present illness. Otherwise, complete review of systems is positive for limited appetite, improved leg pain/foot pain.  All other systems are reviewed and negative.   Physical Exam: VS:  BP 124/74 (BP Location: Left Arm)   Pulse 84   Ht 5\' 3"  (1.6 m)   Wt 153 lb (69.4 kg)   BMI 27.10 kg/m , BMI Body mass index is 27.1 kg/m.  Wt Readings from Last 3 Encounters:  10/24/17 153 lb (69.4 kg)  10/20/17 150 lb 6.4 oz (68.2 kg)  09/13/17 168 lb 9.6 oz (76.5 kg)    General: Patient in no distress. HEENT: Conjunctiva and lids normal, oropharynx clear. Neck: Supple, no elevated JVP or carotid bruits, no thyromegaly. Lungs: Clear to auscultation, nonlabored breathing at rest. Cardiac: Irregularly irregular, no S3, soft apical systolic murmur. Abdomen: Soft, nontender, bowel sounds present. Extremities:  Mild ankle edema, distal pulses diminished. Skin: Warm and dry. Musculoskeletal: No kyphosis. Neuropsychiatric: Alert and oriented x3, affect grossly appropriate.  ECG: I personally reviewed the tracing from 09/08/2017 which showed atrial tachycardia versus flutter with variable conduction, nonspecific ST-T changes.  Recent Labwork: 12/24/2016: B Natriuretic Peptide 385.0 04/09/2017: ALT 23; AST 26 04/12/2017: Magnesium 2.1 08/02/2017: BUN 18; Creatinine, Ser 1.16; Platelets 304; Potassium 3.7; Sodium 140 10/20/2017: Hemoglobin 12.3; TSH 21.59     Component Value Date/Time   CHOL 153 04/09/2017 0331   TRIG 122 04/09/2017 0331   HDL 31 (L) 04/09/2017 0331   CHOLHDL 4.9 04/09/2017 0331   VLDL 24 04/09/2017 0331   LDLCALC 98 04/09/2017 0331    Other Studies Reviewed Today:  TEE 04/12/2017: Study Conclusions  - Left ventricle: Systolic function was severely reduced. The estimated ejection fraction was in the range of 20% to 25%. Diffuse hypokinesis. No evidence of thrombus. - Aortic valve: No evidence of vegetation. - Mitral valve: No evidence of vegetation. There was mild regurgitation. - Left atrium: The atrium was moderately to severely dilated. No evidence of thrombus in the atrial cavity or appendage. No evidence of thrombus in the atrial cavity or appendage. - Right ventricle: The cavity size was dilated. Systolic function was severely reduced. - Right atrium: The atrium was severely dilated. - Atrial septum: No defect or patent foramen ovale was identified. - Tricuspid valve: No evidence of vegetation. There was moderate regurgitation.  Impressions:  - Successful cardioversion. No cardiac source of emboli was indentified.  Assessment and Plan:  1.  Persistent atrial fibrillation/flutter.  She does not report palpitations and heart rate control is adequate at this time.  Also continues on Eliquis for stroke prophylaxis.  For now I am going to stop  amiodarone as this has not been effective long-term and controlling her arrhythmia, and also in light of thyroid studies.  2.  Multivessel CAD status post CABG in 2003.  She reports no active angina on medical therapy with documented graft disease and suboptimal revascularization options.  3.  Severe PAD status post percutaneous interventions by Dr. Allyson Sabal.  She continues to follow closely with Dr. Allyson Sabal.  4.  Statin intolerance, declines Zetia as well.  5.  Chronic combined heart failure with LVEF 20 to 25% range.  Weight is down significantly, she will continue Demadex 40 mg in the morning, cut KCl to 20 mEq daily.  Check BMET in the next few weeks.  Continue to weigh daily.  Keep follow-up with Dr. Gala Romney  as scheduled.  Current medicines were reviewed with the patient today.   Orders Placed This Encounter  Procedures  . Basic Metabolic Panel (BMET)    Disposition: Follow-up in 3 months.  Signed, Jonelle SidleSamuel G. McDowell, MD, [facility]ACC 10/24/2017 11:39 AM    [facility] [address], [place], [place] [address] Phone: [phone]; Fax: [phone]

## 2017-10-20 NOTE — Progress Notes (Signed)
Subjective:    Patient ID: [name], female    DOB: 10/06/1945, 72 y.o.   MRN: [id number]  HPI Referred by Dr. Leandrew KoyanagiBurdine for anemia. She says she has hx of anemia.  Her hemoglobin 09/12/2017 was .8.9 and was transfused with 2 units of PRBCs. Has been off Hydrocodone for over a week. She did say she had some withdrawal symptoms. 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 Dressing are in place now. She says she has lost about 14 pounds in the past week. She says she has no appetite to due her health problems. She is having a BM every 2 days. She says her stools are back to normal since stopping the Hydrocodone. She had a maternal aunt with colon cancer in her 8170s.  Her last colon[place] was 3 [place]rs ago in [place] and was normal. No polyps.   09/12/2017 H and H 8.9 and 29.0 Vitamin B12 393  Hx of atrial flutter and maintained on Eliquis. Hx of femoral  stents and maintained on Plavix Hx of PVD   Review of Systems Past Medical History:  Diagnosis Date  . Anemia 10/20/2017  . Asthma   . CAD (coronary artery disease)    Multivessel status post CABG in 2003  . Chronic combined systolic (congestive) and diastolic (congestive) heart failure (HCC) 12/25/2016  . Chronic systolic heart failure (HCC)   . Essential hypertension   . GERD (gastroesophageal reflux disease)   . Gum disease   . History of atrial flutter    Cardioversion 2016 in [place]  . History of colonic polyps   . History of goiter   . Hyperlipidemia   . Hypothyroidism   . Ischemic cardiomyopathy 12/25/2016  . Myocardial infarction (HCC)    2003  . Peripheral arterial disease (HCC)    Stent revascularization 2015 - details not clear  . Renal insufficiency   . Type 2 diabetes mellitus (HCC)     Past Surgical History:  Procedure Laterality Date  . ABDOMINAL AORTOGRAM W/LOWER EXTREMITY Right 05/30/2017   Procedure: ABDOMINAL AORTOGRAM W/LOWER EXTREMITY;  Surgeon: Runell GessBerry, Jonathan J, MD;   [facility];  Service: Cardiovascular;  Laterality: Right;  . ABDOMINAL AORTOGRAM W/LOWER EXTREMITY N/A 07/14/2017   Procedure: ABDOMINAL AORTOGRAM W/LOWER EXTREMITY;  Surgeon: Fransisco Hertzhen, Brian L, MD;  [facility];  Service: Cardiovascular;  Laterality: N/A;  . CARDIAC CATHETERIZATION    . CARDIOVERSION N/A 04/12/2017   Procedure: CARDIOVERSION;  Surgeon: Dolores PattyBensimhon, Daniel R, MD;  [facility];  Service: Cardiovascular;  Laterality: N/A;  . CARDIOVERSION N/A 05/02/2017   Procedure: CARDIOVERSION;  Surgeon: Dolores PattyBensimhon, Daniel R, MD;  [facility];  Service: Cardiovascular;  Laterality: N/A;  . CHOLECYSTECTOMY     2012  . CORONARY ARTERY BYPASS GRAFT     2003  . LOWER EXTREMITY ANGIOGRAPHY Left 08/01/2017   Procedure: LOWER EXTREMITY ANGIOGRAPHY;  Surgeon: Runell GessBerry, Jonathan J, MD;  [facility]: [facility] LAB;  Service: Cardiovascular;  Laterality: Left;  . PARATHYROIDECTOMY     2015  . PERIPHERAL VASCULAR BALLOON ANGIOPLASTY Left 08/01/2017   Procedure: PERIPHERAL VASCULAR BALLOON ANGIOPLASTY;  Surgeon: Runell GessBerry, Jonathan J, MD;  Location: [facility] IN[facility] CV LAB;  Service: Cardiovascular;  Laterality: Left;  attempted  . PERIPHERAL VASCULAR INTERVENTION Right 05/30/2017  . PERIPHERAL VASCULAR INTERVENTION Right 05/30/2017   Procedure: PERIPHERAL VASCULAR INTERVENTION;  Surgeon: Runell GessBerry, Jonathan J, MD;  [facility];  Service: Cardiovascular;  Laterality: Right;  .  PERIPHERAL VASCULAR INTERVENTION Left 08/01/2017   Procedure: PERIPHERAL VASCULAR INTERVENTION;  Surgeon: Runell Gess, MD;  [facility]: [facility];  Service: Cardiovascular;  Laterality: Left;  Peroneal stent left  . RIGHT/LEFT HEART CATH AND CORONARY/GRAFT ANGIOGRAPHY N/A 04/08/2017   Procedure: RIGHT/LEFT HEART CATH AND CORONARY/GRAFT ANGIOGRAPHY;  Surgeon: Corky Crafts, MD;  Location: [facility] INVASIVE CV LAB;  Service: Cardiovascular;  Laterality: N/A;  . TEE WITHOUT  CARDIOVERSION N/A 04/12/2017   Procedure: TRANSESOPHAGEAL ECHOCARDIOGRAM (TEE);  Surgeon: Dolores Patty, MD;  Location: [facility] ENDOSCOPY;  Service: Cardiovascular;  Laterality: N/A;  . THYROIDECTOMY    . TONSILLECTOMY     1967  . ULTRASOUND GUIDANCE FOR VASCULAR ACCESS  04/08/2017   Procedure: Ultrasound Guidance For Vascular Access;  Surgeon: Corky Crafts, MD;  Location: [facility] INVASIVE CV LAB;  Service: Cardiovascular;;  . ULTRASOUND GUIDANCE FOR VASCULAR ACCESS  07/14/2017   Procedure: Ultrasound Guidance For Vascular Access;  Surgeon: Fransisco Hertz, MD;  Location: [facility] INVASIVE CV LAB;  Service: Cardiovascular;;    Allergies  Allergen Reactions  . Oxycodone Other (See Comments)    hallucinations   . Pollen Extract Other (See Comments)    Sneezing, running nose.  . Ranexa [Ranolazine] Nausea Only    Headache and nausea  . Statins Other (See Comments)    Cramping, turns skin yellow    Current Outpatient Medications on File Prior to Visit  Medication Sig Dispense Refill  . acetaminophen (TYLENOL) 500 MG tablet Take 1,000 mg by mouth daily as needed for moderate pain.     Marland Kitchen albuterol (PROVENTIL HFA;VENTOLIN HFA) 108 (90 Base) MCG/ACT inhaler Inhale 2 puffs into the lungs every 6 (six) hours as needed for wheezing or shortness of breath. 1 Inhaler 2  . amiodarone (PACERONE) 200 MG tablet Take 1 tablet (200 mg total) daily by mouth. 90 tablet 3  . apixaban (ELIQUIS) 5 MG TABS tablet Take 1 tablet (5 mg total) by mouth 2 (two) times daily. 60 tablet 8  . carvedilol (COREG) 6.25 MG tablet Take 1 tablet (6.25 mg total) by mouth 2 (two) times daily. (Patient taking differently: Take 3.25 mg by mouth 2 (two) times daily. ) 60 tablet 3  . clopidogrel (PLAVIX) 75 MG tablet Take 1 tablet (75 mg total) by mouth daily. 30 tablet 11  . levothyroxine (SYNTHROID, LEVOTHROID) 175 MCG tablet Take 175 mcg by mouth daily before breakfast.    . losartan (COZAAR) 25 MG tablet Take 0.5 tablets (12.5 mg  total) by mouth daily. 30 tablet 6  . Magnesium 250 MG TABS Take 250 mg by mouth daily.     . magnesium hydroxide (MILK OF MAGNESIA) 400 MG/5ML suspension Take 30 mLs by mouth daily as needed for mild constipation.    . nitroGLYCERIN (NITRODUR - DOSED IN MG/24 HR) 0.2 mg/hr patch PLACE 1 PATCH ONTO THE SKIN DAILY AS NEEDED FOR CHEST PAINS 90 patch 3  . pantoprazole (PROTONIX) 40 MG tablet TAKE 1 TABLET(40 MG) BY MOUTH DAILY 30 tablet 10  . potassium chloride (K-DUR,KLOR-CON) 10 MEQ tablet Take 2 tablets (20 mEq total) by mouth 2 (two) times daily. 120 tablet 6  . torsemide (DEMADEX) 20 MG tablet Take 3 tablets (60 mg total) by mouth 2 (two) times daily. 180 tablet 6   No current facility-administered medications on file prior to visit.         Objective:   Physical Exam Blood pressure 132/80, pulse 72, temperature 97.9 F (36.6 C), height 5\' 3"  (  1.6 m), weight 150 lb 6.4 oz (68.2 kg). Alert and oriented. Skin warm and dry. Oral mucosa is moist.   . Sclera anicteric, conjunctivae is pink. Thyroid not enlarged. No cervical lymphadenopathy. Lungs clear. Heart regular rate and rhythm.  Abdomen is soft. Bowel sounds are positive. No hepatomegaly. No abdominal masses felt. No tenderness.  No edema to lower extremities.   Stool brown and guaiac negative.        Assessment & Plan:  IDA. Am going to get Iron studies and repeat H and H  3 stool cards home with patient.  Further recommendations to follow.  Family hx of colon cancer. Last colonoscopy was 3 yrs ago.

## 2017-10-20 NOTE — Patient Instructions (Signed)
Labs today. 3 stool cards home with patient.

## 2017-10-24 ENCOUNTER — Encounter: Payer: Self-pay | Admitting: Cardiology

## 2017-10-24 ENCOUNTER — Ambulatory Visit: Payer: Medicare Other | Admitting: Cardiology

## 2017-10-24 VITALS — BP 124/74 | HR 84 | Ht 63.0 in | Wt 153.0 lb

## 2017-10-24 DIAGNOSIS — I5022 Chronic systolic (congestive) heart failure: Secondary | ICD-10-CM

## 2017-10-24 DIAGNOSIS — I483 Typical atrial flutter: Secondary | ICD-10-CM

## 2017-10-24 DIAGNOSIS — I25119 Atherosclerotic heart disease of native coronary artery with unspecified angina pectoris: Secondary | ICD-10-CM

## 2017-10-24 DIAGNOSIS — I739 Peripheral vascular disease, unspecified: Secondary | ICD-10-CM

## 2017-10-24 MED ORDER — POTASSIUM CHLORIDE CRYS ER 10 MEQ PO TBCR: 20.0000 meq | EXTENDED_RELEASE_TABLET | Freq: Every day | ORAL | 6 refills | Status: AC

## 2017-10-24 NOTE — Patient Instructions (Addendum)
Your physician wants you to follow-up in:3 months  with Dr.McDowell You will receive a reminder letter in the mail two months in advance. If you don't receive a letter, please call our office to schedule the follow-up appointment.    STOP Amiodarone    DECREASE Potassium to 20 meq daily     Demadex dose is 40 mg daily ( 2-20 mg tablets)   Get lab work BMET in 2 weeks   Thank you for choosing [facility] !

## 2017-10-25 ENCOUNTER — Telehealth (INDEPENDENT_AMBULATORY_CARE_PROVIDER_SITE_OTHER): Payer: Self-pay | Admitting: Internal Medicine

## 2017-10-25 ENCOUNTER — Telehealth (INDEPENDENT_AMBULATORY_CARE_PROVIDER_SITE_OTHER): Payer: Self-pay | Admitting: *Deleted

## 2017-10-25 NOTE — Telephone Encounter (Signed)
Ann, Please send lab results to her PCP

## 2017-10-25 NOTE — Telephone Encounter (Signed)
Lab report faxed to PCP ? ?

## 2017-10-25 NOTE — Telephone Encounter (Signed)
Results left on answering machine 

## 2017-10-25 NOTE — Telephone Encounter (Signed)
Mitzie, OV in 3 months.  

## 2017-10-25 NOTE — Telephone Encounter (Signed)
   Diagnosis:    Result(s)   Card 1: Negative:     Card 2:Negative:   Stools were brown in color.    Completed by: Antuane Eastridge ,LPN   HEMOCCULT SENSA DEVELOPER: ZOX#:[id number]   EXPIRATION DATE: 2021-11   HEMOCCULT SENSA CARD:  LOT#:[id number]   EXPIRATION DATE: 08/21   CARD CONTROL RESULTS:  POSITIVE: positive NEGATIVE: Negative    ADDITIONAL COMMENTS: Patient has not been called with results,

## 2017-10-26 ENCOUNTER — Encounter: Payer: Medicare Other | Attending: Internal Medicine | Admitting: Internal Medicine

## 2017-10-26 DIAGNOSIS — I771 Stricture of artery: Secondary | ICD-10-CM | POA: Insufficient documentation

## 2017-10-26 DIAGNOSIS — L97529 Non-pressure chronic ulcer of other part of left foot with unspecified severity: Secondary | ICD-10-CM | POA: Diagnosis not present

## 2017-10-26 DIAGNOSIS — I255 Ischemic cardiomyopathy: Secondary | ICD-10-CM | POA: Insufficient documentation

## 2017-10-26 DIAGNOSIS — Z951 Presence of aortocoronary bypass graft: Secondary | ICD-10-CM | POA: Insufficient documentation

## 2017-10-26 DIAGNOSIS — E11621 Type 2 diabetes mellitus with foot ulcer: Secondary | ICD-10-CM | POA: Insufficient documentation

## 2017-10-26 DIAGNOSIS — L89899 Pressure ulcer of other site, unspecified stage: Secondary | ICD-10-CM | POA: Diagnosis not present

## 2017-10-26 DIAGNOSIS — E1151 Type 2 diabetes mellitus with diabetic peripheral angiopathy without gangrene: Secondary | ICD-10-CM | POA: Insufficient documentation

## 2017-10-26 DIAGNOSIS — I998 Other disorder of circulatory system: Secondary | ICD-10-CM | POA: Diagnosis not present

## 2017-10-26 DIAGNOSIS — Z7982 Long term (current) use of aspirin: Secondary | ICD-10-CM | POA: Diagnosis not present

## 2017-10-26 DIAGNOSIS — Z87891 Personal history of nicotine dependence: Secondary | ICD-10-CM | POA: Insufficient documentation

## 2017-10-26 DIAGNOSIS — Z7902 Long term (current) use of antithrombotics/antiplatelets: Secondary | ICD-10-CM | POA: Insufficient documentation

## 2017-10-26 DIAGNOSIS — I110 Hypertensive heart disease with heart failure: Secondary | ICD-10-CM | POA: Insufficient documentation

## 2017-10-26 DIAGNOSIS — I252 Old myocardial infarction: Secondary | ICD-10-CM | POA: Diagnosis not present

## 2017-10-26 DIAGNOSIS — J45909 Unspecified asthma, uncomplicated: Secondary | ICD-10-CM | POA: Diagnosis not present

## 2017-10-26 DIAGNOSIS — I509 Heart failure, unspecified: Secondary | ICD-10-CM | POA: Insufficient documentation

## 2017-10-26 DIAGNOSIS — E1140 Type 2 diabetes mellitus with diabetic neuropathy, unspecified: Secondary | ICD-10-CM | POA: Insufficient documentation

## 2017-10-26 DIAGNOSIS — I2510 Atherosclerotic heart disease of native coronary artery without angina pectoris: Secondary | ICD-10-CM | POA: Diagnosis not present

## 2017-10-26 DIAGNOSIS — Z95820 Peripheral vascular angioplasty status with implants and grafts: Secondary | ICD-10-CM | POA: Insufficient documentation

## 2017-10-26 NOTE — Telephone Encounter (Signed)
Scheduled for 01-26-18

## 2017-10-26 NOTE — Telephone Encounter (Signed)
err

## 2017-10-29 NOTE — Progress Notes (Signed)
PERRIN, EDDLEMAN ([id number]) Visit Report for 10/26/2017 HPI Details Patient Name: [name], [name] Date of Service: 10/26/2017 1:15 PM Medical Record Number: [id number] Patient Account Number: [id number] Date of Birth/Sex: 12-12-1945 (71 y.o. F) Treating RN: Curtis Sites Primary Care Provider: Quintin Alto Other Clinician: Referring Provider: Quintin Alto Treating Provider/Extender: Altamese [place] in Treatment: 19 History of Present Illness Location: right foot toes Quality: Patient reports experiencing a sharp pain to affected area(s). Severity: Patient states wound are getting worse. Duration: Patient has had the wound for < 2 weeks prior to presenting for treatment Timing: Pain in wound is constant (hurts all the time) Context: The wound appeared gradually over time Modifying Factors: Other treatment(s) tried include:recent interventional process to place a stent in her right lower extremity for critical limb ischemia Associated Signs and Symptoms: Patient reports having foul odor. HPI Description: 72 year old female who was referred by Dr. Nanetta Batty, who performed a endovascular procedure on 05/26/2017, for critical limb ischemia. Her angiogram showed a 99% below the knee popliteal stenosis which is stented with a 4 mm drug eluting stent. On 06/10/2017 he noted that her pulses are palpable and her Dopplers have normalized. He referred her to wound care for further advice and put her on Keflex 500 mg twice daily for 10 days. She has a past medical history of asthma, coronary artery disease, CHF, hyperlipidemia, ischemic cardiomyopathy, peripheral arterial disease and type 2 diabetes mellitus. She is status post recent abdominal aortogram with right lower extremity stenting, cholecystectomy, CABG, parathyroidectomy, thyroidectomy. She is a former smoker and quit in 1988. She had an ABI performed on 06/10/2017 with the right ABI within normal range and the left ABI  shows mild lower extremity arterial disease. The right ABI was 1.0 and biphasic and the left ABI was 0.93 and biphasic. Toe pressures were abnormal with the right being 0.27 and left being 0.31. the duplex study done showed a patent right popliteal artery stent without evidence of focal stenosis or obstruction. of note the patient started having signs of critical limb ischemia somewhere at the end of November and after an urgent workup was taken up for limb salvage with a successfully placed stent by Dr. Nanetta Batty, with good arterial perfusion postprocedure with normal ABIs noted on 06/10/2017. 06/22/17; patient was admitted to our clinic last week by Dr. Meyer Russel.she is a type II diabetic. She had undergone a right popliteal DES on 11/29 for critical limb ischemia including erythema and pain of her right forefoot. She states about 3 days later she started developing ulcerations in predominantly the right foot but also the left third toe. 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 This is never really healed rib. Repeat ABIs on 12/14 showed a right ABI of 1 and a left ABI of 0.93 with toe pressures be markedly abnormal as noted above and Dr. Marcie Bal notes. The patient is still having a lot of pain making it very difficult for her to function at home this is worse on the right greater than left foot she does describe severe lancinating pain. She does not ambulate all that much and she finds it intolerable to wear footwear Her history is also notable for A. fib, severe cardiomyopathy with an ejection fraction of 25%_30%. Last echocardiogram in June of this year. According to Epic she is on a combination of Eliquis plavix and aspirin. 06/29/17; patient has an appointment with Dr. Imogene Burn of vascular surgery I believe  next week. This was arranged by her primary physician. She is still having a lot of pain in the right side in general most of her toes look  better however. Supposed to be using silver alginate however [facility] applying currently applied wet to dry dressings. 07/06/17; the patient went for appointment with Dr. Imogene Burn. She is status post right popliteal stenting with peroneal artery angioplasty with Dr. Gery Pray. She presented to our clinic with bilateral lower extremity wounds in her toes which are exceptionally painful. Dr. Imogene Burn put her on a course of antibiotics. Her arterial studies from 06/10/17 showed an ABI in the right of 1 and ABI in the left of 0.8. Waveforms at the dorsalis pedis and posterior tibial were biphasic. However routine ABIs were [name], [name] ([id number]) 0.31 bilaterally. The patient has severe bilateral rest pain. 07/20/17; the patient had her arteriogram by Dr. Imogene Burn on 07/14/17. Based on the images the patient was not felt to require any interventions and no further interventions were possible due to small size of the tibial arteries. The posterior tibial arteries bilaterally were occluded heel arteries were the dominant runoff which were miniscule. Anterior tibials both showed subtotal occlusion in the mid segment and these were small vessels. Paradoxically the patient complains of a lot less pain in the right foot however she is having a lot of pain in the left first and left third toes. She is managing this with activity limitation and narcotics. 07/27/17; I've spoken to Dr. Allyson Sabal today and he is going to do another arteriogram on the left leg early next week with possibility of retrograde arterial intervention. The patient is still having a lot of pain in the left third toe and left first toe which is worse when she puts her leg up i.e. lies in bed etc. 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 She has done better on the right which is the side where she had the DES stent placed in the right popliteal. She still has a wound on the medial aspect of the third toe and black eschar at the tip of the fourth  and fifth toes however most of the rest of the right foot looks better. 08/10/17; the patient was taken for angiography on 08/01/17; she had a successful left peroneal PTA and drug eluding stent now has a widely patent peroneal supply collateral to the distal posterior tibial that feeds the dorsal pedal arch. The patient is on antiplatelet therapy. Unfortunately she has not had much relief of pain in the left great and third toe. She has had an increase in her hydrocodone by her primary physician. She had follow-up noninvasive Doppler test done yesterday although I cannot pull up these results 08/17/17; patient returns to clinic. She states she feels better. She's had an increase in her hydrocodone to 10/325 she is taking a half a tablet every 3 hours and occasionally a whole one at night which allows her to sleep through the night. She is not having pain in the right foot but is having pain in the left. She went to see Dr. Allyson Sabal yesterday. He noted that he performed perineal interventions on 2//19 with 2 overlapping drug-eluting stents for perineal CTO. Her left posterior tibial is occluded as was her left anterior tibial at the level of the ankle. Recent Doppler showed patent peroneal stent. He takes it me yesterday to report the patient was in less pain which she seems to verify although this may be because of an increase  in strength and frequency of her narcotics culture the drainage I did last week showed both methicillin sensitive staph aureus and Pseudomonas. I sent in ciprofloxacin I believe for 10 days which should cover both of these. This may be partially why the pain in the right great toe is better 08/24/17; the patient is generally better. Especially the areas on the right foot where there are still some ischemic change on the right third and right fifth toes but her foot is warm here and I don't think is contributing much to her pain. She still has considerable dry gangrene on the right  great toe with a open area medially. Also the left third toe. Things are still very tender here but considerably better. She doesn't have any purulent drainage or cellulitis and she is finished the antibiotics I gave her. She arrived in clinic with a low blood pressure she is having some chest pain 09/07/17; the patient continues to make decent improvement. She is in a lot less pain only using one half of a hydrocodone 10/325 3 times a day. There is really nothing open except for a small eschar on the tip of her right fourth toe. She still has thick eschar/dry gangrene on the left first and third toes although the pain here is a lot better as well. 10/05/17; we received a call from the patient's home health nurse concerned about erythema of the toes in the left foot requesting an antibiotic. I asked her to be seen by somebody in however she refused and came in for an appointment today. She is still having a fair amount of foot pain especially on the left she takes half and I'm in oxycodone 54 times a day. She tells me she is not eating well. She received 2 units of blood and has a GI follow-up for presumably GI-related anemia. She tells me she saw Dr. Allyson Sabal 2 weeks ago and he is going to follow-up with arterial Dopplers in May 10/12/17; I saw her last week concerned about infection in the plantar left great toe. Marked tenderness. I gave her Augmentin empirically on seeing her back in follow-up of this. She has an appointment with Dr. Allyson Sabal on May 1 presumably for follow-up Dopplers. This patient tends to minimize the amount of discomfort she is in however she tells me for most of this week the pain was not particularly bearable in the left foot. She states yesterday was better. We have been using silver alginate. She has no open area on the right foot although she is still having pain predominantly in the right fourth toe. 10/26/17; patient is here for review of the ischemic wounds on her bilateral  feet. She is been previously revascularized by Dr. Allyson Sabal. She does not have an appointment with him until June. She has managed to wean herself off narcotics in fact she states she went into withdrawal. Electronic Signature(s) Signed: 10/26/2017 4:28:35 PM By: Baltazar Najjar MD Entered By: Baltazar Najjar on 10/26/2017 14:45:04 Cleere, [name] ([id number]) -------------------------------------------------------------------------------- Physical Exam Details Patient Name: [name] Date of Service: 10/26/2017 1:15 PM Medical Record Number: [id number] Patient Account Number: [id number] Date of Birth/Sex: [birth year] (72 y.o. F) Treating RN: Curtis Sites Primary Care Provider: Quintin Alto Other Clinician: Referring Provider: Quintin Alto Treating Provider/Extender: Altamese [place] in Treatment: 19 Constitutional Sitting or standing Blood Pressure is within target range for patient.. Pulse regular and within target range for patient.Marland Kitchen Respirations regular, non-labored and within target range.. Temperature is normal and  within the target range for the patient.Marland Kitchen appears in no distress. Respiratory Respiratory effort is easy and symmetric bilaterally. Rate is normal at rest and on room air.. Cardiovascular Pedal pulses absent bilaterally.. there is no edema. Lymphatic none palpable in the popliteal area bilaterally. Integumentary (Hair, Skin) no rash or other systemic skin issues. Psychiatric No evidence of depression, anxiety, or agitation. Calm, cooperative, and communicative. Appropriate interactions and affect.. Notes wound exam; oThe patient continues to have dry black eschar over the left first and left third toes. Also the left medial first toe. There does not appear to be any additional wounds oAfter careful inspection of the right foot I was able to identify a new open area on the lateral aspect of the third toe. This is small and fairly innocuous  looking. oAll of her toes look ischemic. Electronic Signature(s) Signed: 10/26/2017 4:28:35 PM By: Baltazar Najjar MD Entered By: Baltazar Najjar on 10/26/2017 14:54:18 Griffee, [name] ([id number]) -------------------------------------------------------------------------------- Physician Orders Details Patient Name: [name] Date of Service: 10/26/2017 1:15 PM Medical Record Number: [id number] Patient Account Number: [id number] Date of Birth/Sex: [birth year] (71 y.o. F) Treating RN: Curtis Sites Primary Care Provider: Quintin Alto Other Clinician: Referring Provider: Quintin Alto Treating Provider/Extender: Altamese [place] in Treatment: 61 Verbal / Phone Orders: No Diagnosis Coding Wound Cleansing Wound #1 Right,Distal Toe - Web between 2nd and 3rd o Clean wound with Normal Saline. o May Shower, gently pat wound dry prior to applying new dressing. Wound #2 Right Toe - Web between 3rd and 4th o Clean wound with Normal Saline. o May Shower, gently pat wound dry prior to applying new dressing. Wound #5 Left,Distal Toe Great o Clean wound with Normal Saline. o May Shower, gently pat wound dry prior to applying new dressing. Wound #6 Left,Lateral Toe Great o Clean wound with Normal Saline. o May Shower, gently pat wound dry prior to applying new dressing. Wound #7 Left Toe Third o Clean wound with Normal Saline. o May Shower, gently pat wound dry prior to applying new dressing. Anesthetic (add to Medication List) Wound #1 Right,Distal Toe - Web between 2nd and 3rd o Topical Lidocaine 4% cream applied to wound bed prior to debridement (In Clinic Only). Wound #2 Right Toe - Web between 3rd and 4th o Topical Lidocaine 4% cream applied to wound bed prior to debridement (In Clinic Only). Wound #5 Left,Distal Toe Great o Topical Lidocaine 4% cream applied to wound bed prior to debridement (In Clinic Only). Wound #6 Left,Lateral Toe Great o  Topical Lidocaine 4% cream applied to wound bed prior to debridement (In Clinic Only). Wound #7 Left Toe Third o Topical Lidocaine 4% cream applied to wound bed prior to debridement (In Clinic Only). Skin Barriers/Peri-Wound Care Wound #5 Left,Distal Toe Great o Other: - Betadine Wound #6 Left,Lateral Toe Great o Other: - Betadine Wound #7 Left Toe Third Perko, LENOX LADOUCEUR ([id number]) o Other: - Betadine Primary Wound Dressing Wound #1 Right,Distal Toe - Web between 2nd and 3rd o Other: - betadine, silvercell toes separated Wound #2 Right Toe - Web between 3rd and 4th o Other: - betadine, silvercell toes separated Wound #5 Left,Distal Toe Great o Other: - betadine, silvercell toes separated Wound #6 Left,Lateral Toe Great o Other: - betadine, silvercell toes separated Wound #7 Left Toe Third o Other: - betadine, silvercell toes separated Secondary Dressing Wound #1 Right,Distal Toe - Web between 2nd and 3rd o ABD and Kerlix/Conform o Other - Foam between all toes that do  not have silver alginate between them Wound #2 Right Toe - Web between 3rd and 4th o ABD and Kerlix/Conform o Other - Foam between all toes that do not have silver alginate between them Wound #5 Left,Distal Toe Great o ABD and Kerlix/Conform o Other - Foam between all toes that do not have silver alginate between them Wound #6 Left,Lateral Toe Great o ABD and Kerlix/Conform o Other - Foam between all toes that do not have silver alginate between them Wound #7 Left Toe Third o ABD and Kerlix/Conform o Other - Foam between all toes that do not have silver alginate between them Dressing Change Frequency Wound #1 Right,Distal Toe - Web between 2nd and 3rd o Change dressing every other day. Wound #2 Right Toe - Web between 3rd and 4th o Change dressing every other day. Wound #5 Left,Distal Toe Great o Change dressing every other day. Wound #6 Left,Lateral Toe  Great o Change dressing every other day. Wound #7 Left Toe Third o Change dressing every other day. Follow-up Appointments Drzewiecki, LINDIA GARMS ([id number]) Wound #1 Right,Distal Toe - Web between 2nd and 3rd o Return Appointment in 3 weeks. Wound #2 Right Toe - Web between 3rd and 4th o Return Appointment in 3 weeks. Wound #5 Left,Distal Toe Great o Return Appointment in 3 weeks. Wound #6 Left,Lateral Toe Great o Return Appointment in 3 weeks. Wound #7 Left Toe Third o Return Appointment in 3 weeks. Edema Control Wound #1 Right,Distal Toe - Web between 2nd and 3rd o Elevate legs to the level of the heart and pump ankles as often as possible Wound #2 Right Toe - Web between 3rd and 4th o Elevate legs to the level of the heart and pump ankles as often as possible Wound #5 Left,Distal Toe Great o Elevate legs to the level of the heart and pump ankles as often as possible Wound #6 Left,Lateral Toe Great o Elevate legs to the level of the heart and pump ankles as often as possible Wound #7 Left Toe Third o Elevate legs to the level of the heart and pump ankles as often as possible Off-Loading Wound #1 Right,Distal Toe - Web between 2nd and 3rd o Open toe surgical shoe to: Wound #2 Right Toe - Web between 3rd and 4th o Open toe surgical shoe to: Wound #5 Left,Distal Toe Great o Open toe surgical shoe to: Wound #6 Left,Lateral Toe Great o Open toe surgical shoe to: Wound #7 Left Toe Third o Open toe surgical shoe to: Additional Orders / Instructions Wound #1 Right,Distal Toe - Web between 2nd and 3rd o Increase protein intake. Wound #2 Right Toe - Web between 3rd and 4th o Increase protein intake. Wound #5 Left,Distal Toe Great o Increase protein intake. Kreft, Mechell GMarland Kitchen ([id number]) Wound #6 Left,Lateral Toe Great o Increase protein intake. Wound #7 Left Toe Third o Increase protein intake. Home Health Wound #1 Right,Distal Toe - Web  between 2nd and 3rd o Continue Home Health Visits - [facility] o Home Health Nurse may visit PRN to address patientos wound care needs. o FACE TO FACE ENCOUNTER: MEDICARE and MEDICAID PATIENTS: I certify that this patient is under my care and that I had a face-to-face encounter that meets the physician face-to-face encounter requirements with this patient on this date. The encounter with the patient was in whole or in part for the following MEDICAL CONDITION: (primary reason for Home Healthcare) MEDICAL NECESSITY: I certify, that based on my findings, NURSING services are a medically necessary  home health service. HOME BOUND STATUS: I certify that my clinical findings support that this patient is homebound (i.e., Due to illness or injury, pt requires aid of supportive devices such as crutches, cane, wheelchairs, walkers, the use of special transportation or the assistance of another person to leave their place of residence. There is a normal inability to leave the home and doing so requires considerable and taxing effort. Other absences are for medical reasons / religious services and are infrequent or of short duration when for other reasons). o If current dressing causes regression in wound condition, may D/C ordered dressing product/s and apply Normal Saline Moist Dressing daily until next Wound Healing Center / Other MD appointment. Notify Wound Healing Center of regression in wound condition at [phone]. o Please direct any NON-WOUND related issues/requests for orders to patient's Primary Care Physician Wound #2 Right Toe - Web between 3rd and 4th o Continue Home Health Visits - [facility] o Home Health Nurse may visit PRN to address patientos wound care needs. o FACE TO FACE ENCOUNTER: MEDICARE and MEDICAID PATIENTS: I certify that this patient is under my care and that I had a face-to-face encounter that meets the physician face-to-face encounter requirements with  this patient on this date. The encounter with the patient was in whole or in part for the following MEDICAL CONDITION: (primary reason for Home Healthcare) MEDICAL NECESSITY: I certify, that based on my findings, NURSING services are a medically necessary home health service. HOME BOUND STATUS: I certify that my clinical findings support that this patient is homebound (i.e., Due to illness or injury, pt requires aid of supportive devices such as crutches, cane, wheelchairs, walkers, the use of special transportation or the assistance of another person to leave their place of residence. There is a normal inability to leave the home and doing so requires considerable and taxing effort. Other absences are for medical reasons / religious services and are infrequent or of short duration when for other reasons). o If current dressing causes regression in wound condition, may D/C ordered dressing product/s and apply Normal Saline Moist Dressing daily until next Wound Healing Center / Other MD appointment. Notify Wound Healing Center of regression in wound condition at [phone]. o Please direct any NON-WOUND related issues/requests for orders to patient's Primary Care Physician Wound #5 Left,Distal Toe Great o Continue Home Health Visits - [facility] o Home Health Nurse may visit PRN to address patientos wound care needs. o FACE TO FACE ENCOUNTER: MEDICARE and MEDICAID PATIENTS: I certify that this patient is under my care and that I had a face-to-face encounter that meets the physician face-to-face encounter requirements with this patient on this date. The encounter with the patient was in whole or in part for the following MEDICAL CONDITION: (primary reason for Home Healthcare) MEDICAL NECESSITY: I certify, that based on my findings, NURSING services are a medically necessary home health service. HOME BOUND STATUS: I certify that my clinical findings support that this patient is homebound  (i.e., Due to illness or injury, pt requires aid of supportive devices such as crutches, cane, wheelchairs, walkers, the use of special transportation or the assistance of another person to leave their place of residence. There is a normal inability to leave the home and doing so requires considerable and taxing effort. Other absences are for medical reasons / religious services and are infrequent or of short duration when for other reasons). Shands, [name] ([id number]) o If current dressing causes regression in wound condition, may D/C  ordered dressing product/s and apply Normal Saline Moist Dressing daily until next Wound Healing Center / Other MD appointment. Notify Wound Healing Center of regression in wound condition at [phone]. o Please direct any NON-WOUND related issues/requests for orders to patient's Primary Care Physician Wound #6 Left,Lateral Toe Doretha Sou Continue Home Health Visits - [facility] o Home Health Nurse may visit PRN to address patientos wound care needs. o FACE TO FACE ENCOUNTER: MEDICARE and MEDICAID PATIENTS: I certify that this patient is under my care and that I had a face-to-face encounter that meets the physician face-to-face encounter requirements with this patient on this date. The encounter with the patient was in whole or in part for the following MEDICAL CONDITION: (primary reason for Home Healthcare) MEDICAL NECESSITY: I certify, that based on my findings, NURSING services are a medically necessary home health service. HOME BOUND STATUS: I certify that my clinical findings support that this patient is homebound (i.e., Due to illness or injury, pt requires aid of supportive devices such as crutches, cane, wheelchairs, walkers, the use of special transportation or the assistance of another person to leave their place of residence. There is a normal inability to leave the home and doing so requires considerable and taxing effort. Other absences are  for medical reasons / religious services and are infrequent or of short duration when for other reasons). o If current dressing causes regression in wound condition, may D/C ordered dressing product/s and apply Normal Saline Moist Dressing daily until next Wound Healing Center / Other MD appointment. Notify Wound Healing Center of regression in wound condition at [phone]. o Please direct any NON-WOUND related issues/requests for orders to patient's Primary Care Physician Wound #7 Left Toe Third o Continue Home Health Visits - [facility] o Home Health Nurse may visit PRN to address patientos wound care needs. o FACE TO FACE ENCOUNTER: MEDICARE and MEDICAID PATIENTS: I certify that this patient is under my care and that I had a face-to-face encounter that meets the physician face-to-face encounter requirements with this patient on this date. The encounter with the patient was in whole or in part for the following MEDICAL CONDITION: (primary reason for Home Healthcare) MEDICAL NECESSITY: I certify, that based on my findings, NURSING services are a medically necessary home health service. HOME BOUND STATUS: I certify that my clinical findings support that this patient is homebound (i.e., Due to illness or injury, pt requires aid of supportive devices such as crutches, cane, wheelchairs, walkers, the use of special transportation or the assistance of another person to leave their place of residence. There is a normal inability to leave the home and doing so requires considerable and taxing effort. Other absences are for medical reasons / religious services and are infrequent or of short duration when for other reasons). o If current dressing causes regression in wound condition, may D/C ordered dressing product/s and apply Normal Saline Moist Dressing daily until next Wound Healing Center / Other MD appointment. Notify Wound Healing Center of regression in wound condition at  [phone]. o Please direct any NON-WOUND related issues/requests for orders to patient's Primary Care Physician Electronic Signature(s) Signed: 10/26/2017 4:28:35 PM By: Baltazar Najjar MD Signed: 10/26/2017 4:48:19 PM By: Curtis Sites Entered By: Curtis Sites on 10/26/2017 14:25:52 Wisener, [name] ([id number]) -------------------------------------------------------------------------------- Problem List Details Patient Name: [name] Date of Service: 10/26/2017 1:15 PM Medical Record Number: [id number] Patient Account Number: [id number] Date of Birth/Sex: 1946-01-11 (71 y.o. 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 F) Treating RN: Curtis Sites Primary Care Provider: Quintin Alto Other Clinician: Referring Provider: Quintin Alto Treating Provider/Extender: Altamese [place] in Treatment: 19 Subjective History of Present Illness  (HPI) The following HPI elements were documented for the patient's wound: Location: right foot toes Quality: Patient reports experiencing a sharp pain to affected area(s). Severity: Patient states wound are getting worse. Duration: Patient has had the wound for < 2 weeks prior to presenting for treatment Timing: Pain in wound is constant (hurts all the time) Context: The wound appeared gradually over time Modifying Factors: Other treatment(s) tried include:recent interventional process to place a stent in her right lower extremity for critical limb ischemia Associated Signs and Symptoms: Patient reports having foul odor. 72 year old female who was referred by Dr. Nanetta Batty, who performed a endovascular procedure on 05/26/2017, for critical limb ischemia. Her angiogram showed a 99% below the knee popliteal stenosis which is stented with a 4 mm drug eluting stent. On 06/10/2017 he noted that her pulses are palpable and her Dopplers have normalized. He referred her to wound care for further advice and put her on Keflex 500 mg twice daily for 10 days. She has a past medical history of asthma, coronary artery disease, CHF, hyperlipidemia, ischemic cardiomyopathy, peripheral arterial disease and type 2 diabetes mellitus. She is status post recent abdominal aortogram with right lower extremity stenting, cholecystectomy, CABG, parathyroidectomy, thyroidectomy. She is a former smoker and quit in 1988. 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 The right ABI was 1.0 and biphasic and the left ABI was 0.93 and biphasic. Toe pressures were abnormal with the right being 0.27 and left being 0.31. the duplex study done showed a patent right popliteal artery stent without evidence of focal stenosis or obstruction. of note the patient started having signs of critical limb ischemia somewhere at the end of  November and after an urgent workup was taken up for limb salvage with a successfully placed stent by Dr. Nanetta Batty, with good arterial perfusion postprocedure with normal ABIs noted on 06/10/2017. 06/22/17; patient was admitted to our clinic last week by Dr. Meyer Russel.she is a type II diabetic. She had undergone a right popliteal DES on 11/29 for critical limb ischemia including erythema and pain of her right forefoot. She states about 3 days later she started developing ulcerations in predominantly the right foot but also the left third toe. 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 This is never really healed rib. Repeat ABIs on 12/14 showed a right ABI of 1 and a left ABI of 0.93 with toe pressures be markedly abnormal  as noted above and Dr. Marcie Bal notes. The patient is still having a lot of pain making it very difficult for her to function at home this is worse on the right greater than left foot she does describe severe lancinating pain. She does not ambulate all that much and she finds it intolerable to wear footwear Her history is also notable for A. fib, severe cardiomyopathy with an ejection fraction of 25%_30%. Last echocardiogram in June of this year. According to Epic she is on a combination of Eliquis plavix and aspirin. 06/29/17; patient has an appointment with Dr. Imogene Burn of vascular surgery I believe next week. This was arranged by her primary physician. She is still having a lot of pain in the right side in general most of her toes look better however. Supposed to be using silver alginate however [facility] applying currently applied wet to dry dressings. 07/06/17; the patient went for appointment with Dr. Imogene Burn. She is status post right popliteal stenting with peroneal artery angioplasty with Dr. Gery Pray. She presented to our clinic with bilateral lower extremity wounds in her toes which are exceptionally painful. Dr. Imogene Burn put her  on a course of antibiotics. Her arterial studies from 06/10/17 showed an ABI in the right of 1 and ABI in the left of 0.8. Waveforms at the dorsalis pedis and posterior tibial were biphasic. However routine ABIs were Thurston, Danique G. ([id number]) 0.31 bilaterally. The patient has severe bilateral rest pain. 07/20/17; the patient had her arteriogram by Dr. Imogene Burn on 07/14/17. Based on the images the patient was not felt to require any interventions and no further interventions were possible due to small size of the tibial arteries. The posterior tibial arteries bilaterally were occluded heel arteries were the dominant runoff which were miniscule. Anterior tibials both showed subtotal occlusion in the mid segment and these were small vessels. Paradoxically the patient complains of a lot less pain in the right foot however she is having a lot of pain in the left first and left third toes. She is managing this with activity limitation and narcotics. 07/27/17; I've spoken to Dr. Allyson Sabal today and he is going to do another arteriogram on the left leg early next week with possibility of retrograde arterial intervention. The patient is still having a lot of pain in the left third toe and left first toe which is worse when she puts her leg up i.e. lies in bed etc. 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 She has done better on the right which is the side where she had the DES stent placed in the right popliteal. She still has a wound on the medial aspect of the third toe and black eschar at the tip of the fourth and fifth toes however most of the rest of the right foot looks better. 08/10/17; the patient was taken for angiography on 08/01/17; she had a successful left peroneal PTA and drug eluding stent now has a widely patent peroneal supply collateral to the distal posterior tibial that feeds the dorsal pedal arch. The patient is on antiplatelet therapy. Unfortunately she has not had much relief of pain in the  left great and third toe. She has had an increase in her hydrocodone by her primary physician. She had follow-up noninvasive Doppler test done yesterday although I cannot pull up these results 08/17/17; patient returns to clinic. She states she feels better. She's had an increase in her hydrocodone to 10/325 she is taking a half a tablet every 3  hours and occasionally a whole one at night which allows her to sleep through the night. She is not having pain in the right foot but is having pain in the left. She went to see Dr. Allyson Sabal yesterday. He noted that he performed perineal interventions on 2//19 with 2 overlapping drug-eluting stents for perineal CTO. Her left posterior tibial is occluded as was her left anterior tibial at the level of the ankle. Recent Doppler showed patent peroneal stent. He takes it me yesterday to report the patient was in less pain which she seems to verify although this may be because of an increase in strength and frequency of her narcotics culture the drainage I did last week showed both methicillin sensitive staph aureus and Pseudomonas. I sent in ciprofloxacin I believe for 10 days which should cover both of these. This may be partially why the pain in the right great toe is better 08/24/17; the patient is generally better. Especially the areas on the right foot where there are still some ischemic change on the right third and right fifth toes but her foot is warm here and I don't think is contributing much to her pain. She still has considerable dry gangrene on the right great toe with a open area medially. Also the left third toe. Things are still very tender here but considerably better. She doesn't have any purulent drainage or cellulitis and she is finished the antibiotics I gave her. She arrived in clinic with a low blood pressure she is having some chest pain 09/07/17; the patient continues to make decent improvement. She is in a lot less pain only using one half of  a hydrocodone 10/325 3 times a day. There is really nothing open except for a small eschar on the tip of her right fourth toe. She still has thick eschar/dry gangrene on the left first and third toes although the pain here is a lot better as well. 10/05/17; we received a call from the patient's home health nurse concerned about erythema of the toes in the left foot requesting an antibiotic. I asked her to be seen by somebody in however she refused and came in for an appointment today. She is still having a fair amount of foot pain especially on the left she takes half and I'm in oxycodone 54 times a day. She tells me she is not eating well. She received 2 units of blood and has a GI follow-up for presumably GI-related anemia. She tells me she saw Dr. Allyson Sabal 2 weeks ago and he is going to follow-up with arterial Dopplers in May 10/12/17; I saw her last week concerned about infection in the plantar left great toe. Marked tenderness. I gave her Augmentin empirically on seeing her back in follow-up of this. She has an appointment with Dr. Allyson Sabal on May 1 presumably for follow-up Dopplers. This patient tends to minimize the amount of discomfort she is in however she tells me for most of this week the pain was not particularly bearable in the left foot. She states yesterday was better. We have been using silver alginate. She has no open area on the right foot although she is still having pain predominantly in the right fourth toe. 10/26/17; patient is here for review of the ischemic wounds on her bilateral feet. She is been previously revascularized by Dr. Allyson Sabal. She does not have an appointment with him until June. She has managed to wean herself off narcotics in fact she states she went into withdrawal. Objective  Lamarque, Francille GMarland Kitchen ([id number]) Constitutional Sitting or standing Blood Pressure is within target range for patient.. Pulse regular and within target range for patient.Marland Kitchen Respirations regular,  non-labored and within target range.. Temperature is normal and within the target range for the patient.Marland Kitchen appears in no distress. Vitals Time Taken: 1:40 PM, Height: 63 in, Weight: 148.7 lbs, BMI: 26.3, Temperature: 98.0 F, Pulse: 74 bpm, Respiratory Rate: 18 breaths/min, Blood Pressure: 117/68 mmHg. Respiratory Respiratory effort is easy and symmetric bilaterally. Rate is normal at rest and on room air.. Cardiovascular Pedal pulses absent bilaterally.. there is no edema. Lymphatic none palpable in the popliteal area bilaterally. Psychiatric No evidence of depression, anxiety, or agitation. Calm, cooperative, and communicative. Appropriate interactions and affect.. General Notes: wound exam; The patient continues to have dry black eschar over the left first and left third toes. Also the left medial first toe. There does not appear to be any additional wounds After careful inspection of the right foot I was able to identify a new open area on the lateral aspect of the third toe. This is small and fairly innocuous looking. All of her toes look ischemic. Integumentary (Hair, Skin) no rash or other systemic skin issues. Wound #1 status is Open. Original cause of wound was Gradually Appeared. The wound is located on the Right,Distal Toe - Web between 2nd and 3rd. The wound measures 0.8cm length x 0.5cm width x 0.1cm depth; 0.314cm^2 area and 0.031cm^3 volume. There is no tunneling noted. There is a large amount of serous drainage noted. The wound margin is flat and intact. There is no granulation within the wound bed. There is a large (67-100%) amount of necrotic tissue within the wound bed including Adherent Slough. The periwound skin appearance did not exhibit: Callus, Crepitus, Excoriation, Induration, Rash, Scarring, Dry/Scaly, Maceration, Atrophie Blanche, Cyanosis, Ecchymosis, Hemosiderin Staining, Mottled, Pallor, Rubor, Erythema. Periwound temperature was noted as No Abnormality. The  periwound has tenderness on palpation. Wound #2 status is Open. Original cause of wound was Pressure Injury. The wound is located on the Right Toe - Web between 3rd and 4th. The wound measures 0.5cm length x 0.5cm width x 0.1cm depth; 0.196cm^2 area and 0.02cm^3 volume. There is no tunneling or undermining noted. There is a large amount of serous drainage noted. The wound margin is flat and intact. There is no granulation within the wound bed. There is a large (67-100%) amount of necrotic tissue within the wound bed including Eschar and Adherent Slough. The periwound skin appearance exhibited: Cyanosis. The periwound skin appearance did not exhibit: Callus, Crepitus, Excoriation, Induration, Rash, Scarring, Dry/Scaly, Maceration, Atrophie Blanche, Ecchymosis, Hemosiderin Staining, Mottled, Pallor, Rubor, Erythema. Periwound temperature was noted as No Abnormality. The periwound has tenderness on palpation. Wound #5 status is Open. Original cause of wound was Gradually Appeared. The wound is located on the [employer]. The wound measures 2.5cm length x 2.5cm width x 0.1cm depth; 4.909cm^2 area and 0.491cm^3 volume. There is no tunneling or undermining noted. There is a none present amount of drainage noted. The wound margin is flat and intact. There is no granulation within the wound bed. There is a large (67-100%) amount of necrotic tissue within the wound bed including Eschar. The periwound skin appearance exhibited: Cyanosis. The periwound skin appearance did not exhibit: Callus, Crepitus, Excoriation, Induration, Rash, Scarring, Dry/Scaly, Maceration, Atrophie Blanche, Ecchymosis, Hemosiderin Staining, Mottled, Pallor, Rubor, Erythema. Periwound temperature was noted as No Abnormality. The periwound has tenderness on palpation. Wound #6 status is Open. Original cause of  wound was Gradually Appeared. The wound is located on the [employer]. The wound measures 1.3cm length x  0.5cm width x 0.1cm depth; 0.511cm^2 area and 0.051cm^3 volume. There is no tunneling or undermining noted. There is a large amount of serous drainage noted. The wound margin is flat and intact. There Sangiovanni, Haely G. ([id number]) is no granulation within the wound bed. There is a large (67-100%) amount of necrotic tissue within the wound bed including Eschar. The periwound skin appearance exhibited: Cyanosis. The periwound skin appearance did not exhibit: Callus, Crepitus, Excoriation, Induration, Rash, Scarring, Dry/Scaly, Maceration, Atrophie Blanche, Ecchymosis, Hemosiderin Staining, Mottled, Pallor, Rubor, Erythema. Periwound temperature was noted as No Abnormality. The periwound has tenderness on palpation. Wound #7 status is Open. Original cause of wound was Gradually Appeared. The wound is located on the Left Toe Third. The wound measures 1.3cm length x 1.7cm width x 0.3cm depth; 1.736cm^2 area and 0.521cm^3 volume. There is no tunneling or undermining noted. There is a large amount of purulent drainage noted. The wound margin is flat and intact. There is no granulation within the wound bed. There is a large (67-100%) amount of necrotic tissue within the wound bed including Eschar and Adherent Slough. The periwound skin appearance exhibited: Cyanosis. The periwound skin appearance did not exhibit: Callus, Crepitus, Excoriation, Induration, Rash, Scarring, Dry/Scaly, Maceration, Atrophie Blanche, Ecchymosis, Hemosiderin Staining, Mottled, Pallor, Rubor, Erythema. Periwound temperature was noted as No Abnormality. The periwound has tenderness on palpation. 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 May Shower, gently pat wound dry prior to applying new dressing. Wound #2 Right Toe - Web between 3rd and 4th: Clean wound with Normal Saline. May Shower, gently pat wound dry prior to applying new dressing. Wound #5 Left,Distal Toe Great: Clean wound with Normal Saline. May Shower, gently pat wound dry prior to applying new dressing. Wound #6 Left,Lateral Toe Great: Clean wound with Normal Saline. May Shower, gently pat wound dry prior to applying new dressing. Wound #7 Left Toe Third: Clean wound with Normal Saline. May Shower, gently pat wound dry prior to applying new dressing. Anesthetic (add to Medication List): Wound #1 Right,Distal Toe - Web between 2nd and 3rd: Topical Lidocaine 4% cream applied to wound bed prior to debridement (In Clinic Only). Wound #2 Right Toe - Web between 3rd and 4th: Topical Lidocaine 4% cream applied to wound bed prior to debridement (In Clinic Only). Wound #5 Left,Distal Toe Great: Topical Lidocaine 4% cream applied to wound bed prior to debridement (In Clinic Only). Wound #6 Left,Lateral Toe Great: [place], EMMYLOU BIEKER. ([id number]) Topical Lidocaine 4% cream applied to wound bed prior to debridement (In Clinic Only). Wound #7 Left Toe Third: Topical Lidocaine 4% cream applied to wound bed prior to debridement (In Clinic Only). Skin Barriers/Peri-Wound Care: Wound #5 Left,Distal Toe Great: Other: - Betadine Wound #6 Left,Lateral Toe Great: Other: - Betadine Wound #7 Left Toe Third: Other: - Betadine Primary Wound Dressing: Wound #1 Right,Distal Toe - Web between 2nd and 3rd: Other: - betadine, silvercell toes separated Wound #2 Right Toe - Web between 3rd and 4th: Other: - betadine, silvercell toes separated Wound #5 Left,Distal Toe  Great: Other: - betadine, silvercell toes separated Wound #6 Left,Lateral Toe Great: Other: - betadine, silvercell  toes separated Wound #7 Left Toe Third: Other: - betadine, silvercell toes separated Secondary Dressing: Wound #1 Right,Distal Toe - Web between 2nd and 3rd: ABD and Kerlix/Conform Other - Foam between all toes that do not have silver alginate between them Wound #2 Right Toe - Web between 3rd and 4th: ABD and Kerlix/Conform Other - Foam between all toes that do not have silver alginate between them Wound #5 Left,Distal Toe Great: ABD and Kerlix/Conform Other - Foam between all toes that do not have silver alginate between them Wound #6 Left,Lateral Toe Great: ABD and Kerlix/Conform Other - Foam between all toes that do not have silver alginate between them Wound #7 Left Toe Third: ABD and Kerlix/Conform Other - Foam between all toes that do not have silver alginate between them Dressing Change Frequency: Wound #1 Right,Distal Toe - Web between 2nd and 3rd: Change dressing every other day. Wound #2 Right Toe - Web between 3rd and 4th: Change dressing every other day. Wound #5 Left,Distal Toe Great: Change dressing every other day. Wound #6 Left,Lateral Toe Great: Change dressing every other day. Wound #7 Left Toe Third: Change dressing every other day. Follow-up Appointments: Wound #1 Right,Distal Toe - Web between 2nd and 3rd: Return Appointment in 3 weeks. Wound #2 Right Toe - Web between 3rd and 4th: Return Appointment in 3 weeks. Wound #5 Left,Distal Toe Great: Return Appointment in 3 weeks. Wound #6 Left,Lateral Toe Great: Return Appointment in 3 weeks. Wound #7 Left Toe Third: Return Appointment in 3 weeks. Edema Control: DENEENE, TARVER ([id number]) Wound #1 Right,Distal Toe - Web between 2nd and 3rd: Elevate legs to the level of the heart and pump ankles as often as possible Wound #2 Right Toe - Web between 3rd and 4th: Elevate legs to the level of the heart and pump ankles as often as possible Wound #5 Left,Distal Toe Great: Elevate legs to the level of the  heart and pump ankles as often as possible Wound #6 Left,Lateral Toe Great: Elevate legs to the level of the heart and pump ankles as often as possible Wound #7 Left Toe Third: Elevate legs to the level of the heart and pump ankles as often as possible Off-Loading: Wound #1 Right,Distal Toe - Web between 2nd and 3rd: Open toe surgical shoe to: Wound #2 Right Toe - Web between 3rd and 4th: Open toe surgical shoe to: Wound #5 Left,Distal Toe Great: Open toe surgical shoe to: Wound #6 Left,Lateral Toe Great: Open toe surgical shoe to: Wound #7 Left Toe Third: Open toe surgical shoe to: Additional Orders / Instructions: Wound #1 Right,Distal Toe - Web between 2nd and 3rd: Increase protein intake. Wound #2 Right Toe - Web between 3rd and 4th: Increase protein intake. Wound #5 Left,Distal Toe Great: Increase protein intake. Wound #6 Left,Lateral Toe Great: Increase protein intake. Wound #7 Left Toe Third: Increase protein intake. Home Health: Wound #1 Right,Distal Toe - Web between 2nd and 3rd: Continue Home Health Visits - [facility] Nurse may visit [facility]N to address patient s wound care needs. FACE TO FACE ENCOUNTER: MEDICARE and MEDICAID PATIENTS: I certify that this patient is under my care and that I had a face-to-face encounter that meets the physician face-to-face encounter requirements with this patient on this date. The encounter with the patient was in whole or in part for the following MEDICAL CONDITION: (primary reason for Home Healthcare) MEDICAL NECESSITY:  I certify, that based on my findings, NURSING services are a medically necessary home health service. HOME BOUND STATUS: I certify that my clinical findings support that this patient is homebound (i.e., Due to illness or injury, pt requires aid of supportive devices such as crutches, cane, wheelchairs, walkers, the use of special transportation or the assistance of another person to leave their place of  residence. There is a normal inability to leave the home and doing so requires considerable and taxing effort. Other absences are for medical reasons / religious services and are infrequent or of short duration when for other reasons). If current dressing causes regression in wound condition, may D/C ordered dressing product/s and apply Normal Saline Moist Dressing daily until next Wound Healing Center / Other MD appointment. Notify Wound Healing Center of regression in wound condition at [phone]. Please direct any NON-WOUND related issues/requests for orders to patient's Primary Care Physician Wound #2 Right Toe - Web between 3rd and 4th: Continue Home Health Visits - [facility] may visit PRN to address patient s wound care needs. FACE TO FACE ENCOUNTER: MEDICARE and MEDICAID PATIENTS: I certify that this patient is under my care and that I had a face-to-face encounter that meets the physician face-to-face encounter requirements with this patient on this date. The encounter with the patient was in whole or in part for the following MEDICAL CONDITION: (primary reason for Home Healthcare) MEDICAL NECESSITY: I certify, that based on my findings, NURSING services are a medically necessary home health service. HOME BOUND STATUS: I certify that my clinical findings support that this patient is homebound (i.e., Due to illness or injury, pt requires aid of supportive devices such as crutches, cane, wheelchairs, walkers, the use of special transportation or the assistance of another person to leave their place of residence. There is a normal inability to leave the home and doing so requires considerable and taxing effort. Other absences are for medical reasons / religious services and [facility], LOUNELL SCHUMACHER. ([id number]) are infrequent or of short duration when for other reasons). If current dressing causes regression in wound condition, may D/C ordered dressing product/s and apply Normal  Saline Moist Dressing daily until next Wound Healing Center / Other MD appointment. Notify Wound Healing Center of regression in wound condition at [phone]. Please direct any NON-WOUND related issues/requests for orders to patient's Primary Care Physician Wound #5 Left,Distal Toe Great: Continue Home Health Visits - [facility] may visit PRN to address patient s wound care needs. FACE TO FACE ENCOUNTER: MEDICARE and MEDICAID PATIENTS: I certify that this patient is under my care and that I had a face-to-face encounter that meets the physician face-to-face encounter requirements with this patient on this date. The encounter with the patient was in whole or in part for the following MEDICAL CONDITION: (primary reason for Home Healthcare) MEDICAL NECESSITY: I certify, that based on my findings, NURSING services are a medically necessary home health service. HOME BOUND STATUS: I certify that my clinical findings support that this patient is homebound (i.e., Due to illness or injury, pt requires aid of supportive devices such as crutches, cane, wheelchairs, walkers, the use of special transportation or the assistance of another person to leave their place of residence. There is a normal inability to leave the home and doing so requires considerable and taxing effort. Other absences are for medical reasons / religious services and are infrequent or of short duration when for other reasons). If current dressing causes regression in  wound condition, may D/C ordered dressing product/s and apply Normal Saline Moist Dressing daily until next Wound Healing Center / Other MD appointment. Notify Wound Healing Center of regression in wound condition at [phone]. Please direct any NON-WOUND related issues/requests for orders to patient's Primary Care Physician Wound #6 Left,Lateral Toe Great: Continue Home Health Visits - [facility] may visit PRN to address patient s  wound care needs. FACE TO FACE ENCOUNTER: MEDICARE and MEDICAID PATIENTS: I certify that this patient is under my care and that I had a face-to-face encounter that meets the physician face-to-face encounter requirements with this patient on this date. The encounter with the patient was in whole or in part for the following MEDICAL CONDITION: (primary reason for Home Healthcare) MEDICAL NECESSITY: I certify, that based on my findings, NURSING services are a medically necessary home health service. HOME BOUND STATUS: I certify that my clinical findings support that this patient is homebound (i.e., Due to illness or injury, pt requires aid of supportive devices such as crutches, cane, wheelchairs, walkers, the use of special transportation or the assistance of another person to leave their place of residence. There is a normal inability to leave the home and doing so requires considerable and taxing effort. Other absences are for medical reasons / religious services and are infrequent or of short duration when for other reasons). If current dressing causes regression in wound condition, may D/C ordered dressing product/s and apply Normal Saline Moist Dressing daily until next Wound Healing Center / Other MD appointment. Notify Wound Healing Center of regression in wound condition at [phone]. Please direct any NON-WOUND related issues/requests for orders to patient's Primary Care Physician Wound #7 Left Toe Third: Continue Home Health Visits - [facility] may visit PRN to address patient s wound care needs. FACE TO FACE ENCOUNTER: MEDICARE and MEDICAID PATIENTS: I certify that this patient is under my care and that I had a face-to-face encounter that meets the physician face-to-face encounter requirements with this patient on this date. The encounter with the patient was in whole or in part for the following MEDICAL CONDITION: (primary reason for Home Healthcare) MEDICAL  NECESSITY: I certify, that based on my findings, NURSING services are a medically necessary home health service. HOME BOUND STATUS: I certify that my clinical findings support that this patient is homebound (i.e., Due to illness or injury, pt requires aid of supportive devices such as crutches, cane, wheelchairs, walkers, the use of special transportation or the assistance of another person to leave their place of residence. There is a normal inability to leave the home and doing so requires considerable and taxing effort. Other absences are for medical reasons / religious services and are infrequent or of short duration when for other reasons). If current dressing causes regression in wound condition, may D/C ordered dressing product/s and apply Normal Saline Moist Dressing daily until next Wound Healing Center / Other MD appointment. Notify Wound Healing Center of regression in wound condition at [phone]. Please direct any NON-WOUND related issues/requests for orders to patient's Primary Care Physician #1 we continued with silver alginate between the new open areas on the right second and third toe #2Betadine to the areas on the left third and first toes. She has had some tissue separation here but still a ways to go. VIRLEE, STROSCHEIN ([id number]) Electronic Signature(s) Signed: 10/26/2017 4:28:35 PM By: Baltazar Najjar MD Entered By: Baltazar Najjar on 10/26/2017 14:55:43 Janis, [name] ([id number]) -------------------------------------------------------------------------------- SuperBill Details Patient  Name: Kuna, [name] Date of Service: 10/26/2017 Medical Record Number: [id number] Patient Account Number: [id number] Date of Birth/Sex: 29-Jan-1946 (72 y.o. 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 Atherosclerosis of nonbiological bypass graft(s) of the extremities with gangrene, right leg Facility Procedures CPT4 Code: [id number] Description: 47829 - WOUND CARE VISIT-LEV 5 EST PT Modifier: Quantity: 1 Physician Procedures CPT4 Code Description: [id number] 65784 - WC PHYS LEVEL 3 - EST PT ICD-10 Diagnosis Description 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

## 2017-10-30 NOTE — Progress Notes (Addendum)
[name], [name] ([id number]) Visit Report for 10/26/2017 Arrival Information Details Patient Name: [name], [name] Date of Service: 10/26/2017 1:15 PM Medical Record Number: [id number] Patient Account Number: [id number] Date of Birth/Sex: 10-28-1945 (72 y.o. F) Treating RN: [name] Primary Care [name]: [name] Other Clinician: Referring [name]: [name] Treating [name]/Extender: [name] in Treatment: 19 Visit Information History Since Last Visit Added or deleted any medications: Yes Patient Arrived: Walker Any new allergies or adverse reactions: No Arrival Time: 13:36 Had a fall or experienced change in No Accompanied By: niece activities of daily living that may affect Transfer Assistance: None risk of falls: Patient Identification Verified: Yes Signs or symptoms of abuse/neglect since last visito No Secondary Verification Process Yes Hospitalized since last visit: No Completed: Implantable device outside of the clinic excluding No Patient Requires Transmission-Based No cellular tissue based products placed in the center Precautions: since last visit: Patient Has Alerts: Yes Has Dressing in Place as Prescribed: Yes Patient Alerts: Patient on Blood Pain Present Now: No Thinner Prediabetic Eliquis Electronic Signature(s) Signed: 10/27/2017 10:48:08 AM By: [name] RCP, RRT, CHT Entered By: [name] on 10/26/2017 13:39:40 [name], [name] [name] ([id number]) -------------------------------------------------------------------------------- Clinic Level of Care Assessment Details Patient Name: [name] Date of Service: 10/26/2017 1:15 PM Medical Record Number: [id number] Patient Account Number: [id number] Date of Birth/Sex: Oct 28, 1945 (71 y.o. F) Treating RN: [name] Primary Care Modell Fendrick: [name] Other Clinician: Referring [name]: [name] Treating [name]/Extender:  [name]  in Treatment: 19 Clinic Level of Care Assessment Items TOOL 4 Quantity Score  - Use when only an EandM is performed on FOLLOW-UP visit 0 ASSESSMENTS - Nursing Assessment / Reassessment X - Reassessment of Co-morbidities (includes updates in patient status) 1 10 X- 1 5 Reassessment of Adherence to Treatment Plan ASSESSMENTS - Wound and Skin Assessment / Reassessment  - Simple Wound Assessment / Reassessment - one wound 0 X- 5 5 Complex Wound Assessment / Reassessment - multiple wounds  - 0 Dermatologic / Skin Assessment (not related to wound area) ASSESSMENTS - Focused Assessment  - Circumferential Edema Measurements - multi extremities 0  - 0 Nutritional Assessment / Counseling / Intervention X- 1 5 Lower Extremity Assessment (monofilament, tuning fork, pulses)  - 0 Peripheral Arterial Disease Assessment (using hand held doppler) ASSESSMENTS - Ostomy and/or Continence Assessment and Care  - Incontinence Assessment and Management 0  - 0 Ostomy Care Assessment and Management (repouching, etc.) PROCESS - Coordination of Care X - Simple Patient / Family Education for ongoing care 1 15  - 0 Complex (extensive) Patient / Family Education for ongoing care  - 0 Staff obtains Chiropractor, Records, Test Results / Process Orders  - 0 Staff telephones HHA, Nursing Homes / Clarify orders / etc  - 0 Routine Transfer to another Facility (non-emergent condition)  - 0 Routine Hospital Admission (non-emergent condition)  - 0 New Admissions / Manufacturing engineer / Ordering NPWT, Apligraf, etc.  - 0 Emergency Hospital Admission (emergent condition) X- 1 10 Simple Discharge Coordination [name], [name] ([id number])  - 0 Complex (extensive) Discharge Coordination PROCESS - Special Needs  - Pediatric / Minor Patient Management 0  - 0 Isolation Patient Management  - 0 Hearing / Language / Visual special needs  -  0 Assessment of Community assistance (transportation, D/C planning, etc.)  - 0 Additional assistance / Altered mentation  - 0 Support Surface(s) Assessment (bed, cushion, seat, etc.) INTERVENTIONS - Wound Cleansing / Measurement  - Simple  Wound Cleansing - one wound 0 X- 5 5 Complex Wound Cleansing - multiple wounds X- 1 5 Wound Imaging (photographs - any number of wounds) []  - 0 Wound Tracing (instead of photographs) []  - 0 Simple Wound Measurement - one wound X- 5 5 Complex Wound Measurement - multiple wounds INTERVENTIONS - Wound Dressings X - Small Wound Dressing one or multiple wounds 5 10 []  - 0 Medium Wound Dressing one or multiple wounds []  - 0 Large Wound Dressing one or multiple wounds []  - 0 Application of Medications - topical []  - 0 Application of Medications - injection INTERVENTIONS - Miscellaneous []  - External ear exam 0 []  - 0 Specimen Collection (cultures, biopsies, blood, body fluids, etc.) []  - 0 Specimen(s) / Culture(s) sent or taken to Lab for analysis []  - 0 Patient Transfer (multiple staff / Nurse, adult / Similar devices) []  - 0 Simple Staple / Suture removal (25 or less) []  - 0 Complex Staple / Suture removal (26 or more) []  - 0 Hypo / Hyperglycemic Management (close monitor of Blood Glucose) []  - 0 Ankle / Brachial Index (ABI) - do not check if billed separately X- 1 5 Vital Signs [name], [name] ([id number]) Has the patient been seen at the hospital within the last three years: Yes Total Score: 180 Level Of Care: New/Established - Level 5 Electronic Signature(s) Signed: 10/26/2017 4:48:19 PM By: [name] Entered By: [name] on 10/26/2017 14:27:05 [name], [name] [name] ([id number]) -------------------------------------------------------------------------------- Complex / Palliative Patient Assessment Details Patient Name: [name] Date of Service: 10/26/2017 1:15 PM Medical Record Number: [id number] Patient Account  Number: [id number] Date of Birth/Sex: 04-Jan-1946 (71 y.o. F) Treating RN: Huel Coventry Primary Care Glessie Eustice: [name] Other Clinician: Referring Anquinette Pierro: [name] Treating Saavi Mceachron/Extender: [name] [place] in Treatment: 19 Palliative Management Criteria Complex Wound Management Criteria Evaluation by a vascular surgeon has determined that the patient is not a revascularization candidate due to: All procedures have been completed. No additional at this time. Care Approach Wound Care Plan: Complex Wound Management Electronic Signature(s) Signed: 12/11/2017 12:30:37 PM By: Elliot Gurney, BSN, RN, CWS, Kim RN, BSN Signed: 12/11/17 5:10:30 PM By: Baltazar Najjar MD Entered By: Elliot Gurney, BSN, RN, CWS, Kim on 12-11-17 12:30:37 Paddack, [name] [name] ([id number]) -------------------------------------------------------------------------------- Encounter Discharge Information Details Patient Name: [name] Date of Service: 10/26/2017 1:15 PM Medical Record Number: [id number] Patient Account Number: [id number] Date of Birth/Sex: [birth year] (72 y.o. F) Treating RN: [name] Primary Care Caston Coopersmith: [name] Other Clinician: Referring Emiley Digiacomo: [name] Treating Sihaam Chrobak/Extender: [name] [place] in Treatment: 52 Encounter Discharge Information Items Discharge Pain Level: 0 Discharge Condition: Stable Ambulatory Status: Walker Discharge Destination: Home Transportation: Private Auto Schedule Follow-up Appointment: Yes Medication Reconciliation completed and No provided to Patient/Care Evelia Waskey: Provided on Clinical Summary of Care: 10/26/2017 Form Type Recipient Paper Patient MD Electronic Signature(s) Signed: 10/26/2017 4:21:17 PM By: Renne Crigler Entered By: Renne Crigler on 10/26/2017 14:43:50 [name], [name] [name] ([id number]) -------------------------------------------------------------------------------- Lower Extremity Assessment  Details Patient Name: [name] Date of Service: 10/26/2017 1:15 PM Medical Record Number: [id number] Patient Account Number: [id number] Date of Birth/Sex: 10/31/1945 (71 y.o. F) Treating RN: Phillis Haggis Primary Care Kenyan Karnes: [name] Other Clinician: Referring Rooney Swails: [name] Treating Keithan Dileonardo/Extender: [name] [place] in Treatment: 19 Vascular Assessment Pulses: Dorsalis Pedis Palpable: [Left:No] [Right:Yes] Doppler Audible: [Left:Yes] Posterior Tibial Extremity colors, hair growth, and conditions: Extremity Color: [Left:Normal] [Right:Normal] Temperature of Extremity: [Left:Warm] [Right:Warm] Capillary Refill: [Left:< 3 seconds] [Right:< 3 seconds]  Electronic Signature(s) Signed: 10/26/2017 1:59:13 PM By: Alejandro Mulling Entered By: Alejandro Mulling on 10/26/2017 13:59:12 [name], [name] [name] ([id number]) -------------------------------------------------------------------------------- Multi Wound Chart Details Patient Name: [name] Date of Service: 10/26/2017 1:15 PM Medical Record Number: [id number] Patient Account Number: [id number] Date of Birth/Sex: March 03, 1946 (72 y.o. F) Treating RN: [name] Primary Care Jakyra Kenealy: [name] Other Clinician: Referring Ocean Kearley: [name] Treating Qunisha Bryk/Extender: [name] [place] in Treatment: 19 Vital Signs Height(in): 63 Pulse(bpm): 74 Weight(lbs): 148.7 Blood Pressure(mmHg): 117/68 Body Mass Index(BMI): 26 Temperature(F): 98.0 Respiratory Rate 18 (breaths/min): Photos: [1:No Photos] [2:No Photos] [5:No Photos] Wound Location: [1:Right Toe - Web between 2nd and 3rd - Distal] [2:Right Toe - Web between 3rd and 4th] [5:Left Toe Great - Distal] Wounding Event: [1:Gradually Appeared] [2:Pressure Injury] [5:Gradually Appeared] Primary Etiology: [1:Arterial Insufficiency Ulcer] [2:Arterial Insufficiency Ulcer] [5:Arterial Insufficiency Ulcer] Comorbid History:  [1:Anemia, Arrhythmia, Congestive Heart Failure, Hypertension, Myocardial Infarction, Peripheral Arterial Disease, Neuropathy] [2:Anemia, Arrhythmia, Congestive Heart Failure, Hypertension, Myocardial Infarction, Peripheral Arterial  Disease, Neuropathy] [5:Anemia, Arrhythmia, Congestive Heart Failure, Hypertension, Myocardial Infarction, Peripheral Arterial Disease, Neuropathy] Date Acquired: [1:05/30/2017] [2:02/21/2017] [5:07/17/2017] Weeks of Treatment: [1:19] [2:18] [5:14] Wound Status: [1:Open] [2:Open] [5:Open] Clustered Wound: [1:Yes] [2:No] [5:No] Pending Amputation on [1:Yes] [2:No] [5:No] Presentation: Measurements L x W x D [1:0.8x0.5x0.1] [2:0.5x0.5x0.1] [5:2.5x2.5x0.1] (cm) Area (cm) : [1:0.314] [2:0.196] [5:4.909] Volume (cm) : [1:0.031] [2:0.02] [5:0.491] % Reduction in Area: [1:98.80%] [2:87.00%] [5:-212.50%] % Reduction in Volume: [1:98.80%] [2:86.80%] [5:-2968.70%] Classification: [1:Full Thickness Without Exposed Support Structures] [2:Partial Thickness] [5:Partial Thickness] Exudate Amount: [1:Large] [2:Large] [5:None Present] Exudate Type: [1:Serous] [2:Serous] [5:N/A] Exudate Color: [1:amber] [2:amber] [5:N/A] Wound Margin: [1:Flat and Intact] [2:Flat and Intact] [5:Flat and Intact] Granulation Amount: [1:None Present (0%)] [2:None Present (0%)] [5:None Present (0%)] Necrotic Amount: [1:Large (67-100%)] [2:Large (67-100%)] [5:Large (67-100%)] Necrotic Tissue: [1:Adherent Slough] [2:Eschar, Adherent Slough] [5:Eschar] Exposed Structures: [1:Fascia: No Fat Layer (Subcutaneous Tissue) Exposed: No Tendon: No Muscle: No] [2:Fascia: No Fat Layer (Subcutaneous Tissue) Exposed: No Tendon: No Muscle: No] [5:Fascia: No Fat Layer (Subcutaneous Tissue) Exposed: No Tendon: No Muscle: No] Joint: No Joint: No Joint: No Bone: No Bone: No Bone: No Epithelialization: None None None Periwound Skin Texture: Excoriation: No Excoriation: No Excoriation: No Induration:  No Induration: No Induration: No Callus: No Callus: No Callus: No Crepitus: No Crepitus: No Crepitus: No Rash: No Rash: No Rash: No Scarring: No Scarring: No Scarring: No Periwound Skin Moisture: Maceration: No Maceration: No Maceration: No Dry/Scaly: No Dry/Scaly: No Dry/Scaly: No Periwound Skin Color: Atrophie Blanche: No Cyanosis: Yes Cyanosis: Yes Cyanosis: No Atrophie Blanche: No Atrophie Blanche: No Ecchymosis: No Ecchymosis: No Ecchymosis: No Erythema: No Erythema: No Erythema: No Hemosiderin Staining: No Hemosiderin Staining: No Hemosiderin Staining: No Mottled: No Mottled: No Mottled: No Pallor: No Pallor: No Pallor: No Rubor: No Rubor: No Rubor: No Temperature: No Abnormality No Abnormality No Abnormality Tenderness on Palpation: Yes Yes Yes Wound Preparation: Ulcer Cleansing: Ulcer Cleansing: Ulcer Cleansing: Rinsed/Irrigated with Saline Rinsed/Irrigated with Saline Rinsed/Irrigated with Saline Topical Anesthetic Applied: Topical Anesthetic Applied: Topical Anesthetic Applied: Other: lidocaine 4% Other: lidocaine 4% Other: lidocaine 4% Wound Number: 6 7 N/A Photos: No Photos No Photos N/A Wound Location: Left Toe Great - Lateral Left Toe Third N/A Wounding Event: Gradually Appeared Gradually Appeared N/A 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 N/A Comorbid History: Anemia, Arrhythmia, Anemia, Arrhythmia, N/A Congestive Heart Failure, Congestive Heart Failure, Hypertension, Myocardial Hypertension, Myocardial Infarction, Peripheral Arterial Infarction, Peripheral Arterial Disease, Neuropathy Disease, Neuropathy Date Acquired: 07/17/2017 07/16/2017 N/A  Weeks of Treatment: 14 14 N/A Wound Status: Open Open N/A Clustered Wound: No No N/A Pending Amputation on No No N/A Presentation: Measurements L x W x D 1.3x0.5x0.1 1.3x1.7x0.3 N/A (cm) Area (cm) : 0.511 1.736 N/A Volume (cm) : 0.051 0.521  N/A % Reduction in Area: 83.60% -130.20% N/A % Reduction in Volume: 83.60% -594.70% N/A Classification: Partial Thickness Partial Thickness N/A Exudate Amount: Large Large N/A Exudate Type: Serous Purulent N/A Exudate Color: amber yellow, brown, green N/A Wound Margin: Flat and Intact Flat and Intact N/A Granulation Amount: None Present (0%) None Present (0%) N/A Necrotic Amount: Large (67-100%) Large (67-100%) N/A Necrotic Tissue: Eschar Eschar, Adherent Slough N/A Heiny, Kathlyne [name] Agar ([id number]) Exposed Structures: Fascia: No Fascia: No N/A Fat Layer (Subcutaneous Fat Layer (Subcutaneous Tissue) Exposed: No Tissue) Exposed: No Tendon: No Tendon: No Muscle: No Muscle: No Joint: No Joint: No Bone: No Bone: No Epithelialization: None None N/A Periwound Skin Texture: Excoriation: No Excoriation: No N/A Induration: No Induration: No Callus: No Callus: No Crepitus: No Crepitus: No Rash: No Rash: No Scarring: No Scarring: No Periwound Skin Moisture: Maceration: No Maceration: No N/A Dry/Scaly: No Dry/Scaly: No Periwound Skin Color: Cyanosis: Yes Cyanosis: Yes N/A Atrophie Blanche: No Atrophie Blanche: No Ecchymosis: No Ecchymosis: No Erythema: No Erythema: No Hemosiderin Staining: No Hemosiderin Staining: No Mottled: No Mottled: No Pallor: No Pallor: No Rubor: No Rubor: No Temperature: No Abnormality No Abnormality N/A Tenderness on Palpation: Yes Yes N/A Wound Preparation: Ulcer Cleansing: Ulcer Cleansing: N/A Rinsed/Irrigated with Saline Rinsed/Irrigated with Saline Topical Anesthetic Applied: Topical Anesthetic Applied: Other: lidocaine 4% Other: lidocaine 4% Treatment Notes Wound #1 (Right, Distal Toe - Web between 2nd and 3rd) 1. Cleansed with: Clean wound with Normal Saline 2. Anesthetic Topical Lidocaine 4% cream to wound bed prior to debridement 4. Dressing Applied: Other dressing (specify in notes) Notes silvercell on open wounds,  betadine on eschar areas and foam between toes Wound #2 (Right Toe - Web between 3rd and 4th) 1. Cleansed with: Clean wound with Normal Saline 2. Anesthetic Topical Lidocaine 4% cream to wound bed prior to debridement 4. Dressing Applied: Other dressing (specify in notes) Notes silvercell on open wounds, betadine on eschar areas and foam between toes Wound #5 (Left, Distal Toe Great) Tyree, [name] ([id number]) 1. Cleansed with: Clean wound with Normal Saline 2. Anesthetic Topical Lidocaine 4% cream to wound bed prior to debridement 4. Dressing Applied: Other dressing (specify in notes) Notes silvercell on open wounds, betadine on eschar areas and foam between toes Wound #6 (Left, Lateral Toe Great) 1. Cleansed with: Clean wound with Normal Saline 2. Anesthetic Topical Lidocaine 4% cream to wound bed prior to debridement 4. Dressing Applied: Other dressing (specify in notes) Notes silvercell on open wounds, betadine on eschar areas and foam between toes Wound #7 (Left Toe Third) 1. Cleansed with: Clean wound with Normal Saline 2. Anesthetic Topical Lidocaine 4% cream to wound bed prior to debridement 4. Dressing Applied: Other dressing (specify in notes) Notes silvercell on open wounds, betadine on eschar areas and foam between toes Electronic Signature(s) Signed: 10/26/2017 4:28:35 PM By: Baltazar Najjar MD Entered By: Baltazar Najjar on 10/26/2017 14:43:29 [name], [name] [name] ([id number]) -------------------------------------------------------------------------------- Multi-Disciplinary Care Plan Details Patient Name: [name] Date of Service: 10/26/2017 1:15 PM Medical Record Number: [id number] Patient Account Number: [id number] Date of Birth/Sex: Oct 30, 1945 (71 y.o. F) Treating RN: [name] Primary Care Galvin Aversa: [name] Other Clinician: Referring Janny Crute: [name] Treating Lynnann Knudsen/Extender: [name] [place] in Treatment:  19 Active Inactive Electronic Signature(s) Signed: 11/22/2017 10:05:20 AM By: Elliot Gurney, BSN, RN, CWS, Kim RN, BSN Signed: 12/01/2017 4:05:15 PM By: [name] Previous Signature: 10/26/2017 4:48:19 PM Version By: [name] Entered By: Elliot Gurney BSN, RN, CWS, Kim on 11/22/2017 10:05:18 Bebout, [name] [name] ([id number]) -------------------------------------------------------------------------------- Pain Assessment Details Patient Name: [name] Date of Service: 10/26/2017 1:15 PM Medical Record Number: [id number] Patient Account Number: [id number] Date of Birth/Sex: 09/09/1945 (72 y.o. F) Treating RN: [name] Primary Care Illias Pantano: [name] Other Clinician: Referring Marlyce Mcdougald: [name] Treating Emilene Roma/Extender: [name] [place] in Treatment: 19 Active Problems Location of Pain Severity and Description of Pain Patient Has Paino No Site Locations Pain Management and Medication Current Pain Management: Electronic Signature(s) Signed: 10/26/2017 4:48:19 PM By: [name] Signed: 10/27/2017 10:48:08 AM By: [name] RCP, RRT, CHT Entered By: [name] on 10/26/2017 13:39:54 Mahnke, [name] [name] ([id number]) -------------------------------------------------------------------------------- Patient/Caregiver Education Details Patient Name: [name] Date of Service: 10/26/2017 1:15 PM Medical Record Number: [id number] Patient Account Number: [id number] Date of Birth/Gender: [birth year] (71 y.o. F) Treating RN: Renne Crigler Primary Care Physician: [name] Other Clinician: Referring Physician: Quintin [name] Treating Physician/Extender: [name] [place] in Treatment: 64 Education Assessment Education Provided To: Patient Education Topics Provided Wound/Skin Impairment: Handouts: Caring for Your Ulcer Methods: Explain/Verbal Responses: State content correctly Electronic Signature(s) Signed:  10/26/2017 4:21:17 PM By: Renne Crigler Entered By: Renne Crigler on 10/26/2017 14:44:06 Asbill, [name] [name] ([id number]) -------------------------------------------------------------------------------- Wound Assessment Details Patient Name: [name] Date of Service: 10/26/2017 1:15 PM Medical Record Number: [id number] Patient Account Number: [id number] Date of Birth/Sex: [birth year] (72 y.o. F) Treating RN: Phillis Haggis Primary Care Faye Strohman: [name] Other Clinician: Referring Martin Belling: [name] Treating Terriona Horlacher/Extender: [name] [place] in Treatment: 19 Wound Status Wound Number: 1 Primary Arterial Insufficiency Ulcer Etiology: Wound Location: Right Toe - Web between 2nd and 3rd - Distal Wound Open Status: Wounding Event: Gradually Appeared Comorbid Anemia, Arrhythmia, Congestive Heart Failure, Date Acquired: 05/30/2017 History: Hypertension, Myocardial Infarction, Peripheral Weeks Of Treatment: 19 Arterial Disease, Neuropathy Clustered Wound: Yes Pending Amputation On Presentation Photos Photo Uploaded By: Alejandro Mulling on 10/26/2017 16:15:13 Wound Measurements Length: (cm) 0.8 Width: (cm) 0.5 Depth: (cm) 0.1 Area: (cm) 0.314 Volume: (cm) 0.031 % Reduction in Area: 98.8% % Reduction in Volume: 98.8% Epithelialization: None Tunneling: No Wound Description Full Thickness Without Exposed Support Classification: Structures Wound Margin: Flat and Intact Exudate Large Amount: Exudate Type: Serous Exudate Color: amber Foul Odor After Cleansing: No Slough/Fibrino Yes Wound Bed Granulation Amount: None Present (0%) Exposed Structure Necrotic Amount: Large (67-100%) Fascia Exposed: No Necrotic Quality: Adherent Slough Fat Layer (Subcutaneous Tissue) Exposed: No Tendon Exposed: No Muscle Exposed: No Joint Exposed: No Bone Exposed: No Sawatzky, Kalli G. ([id number]) Periwound Skin Texture Texture Color No Abnormalities Noted:  No No Abnormalities Noted: No Callus: No Atrophie Blanche: No Crepitus: No Cyanosis: No Excoriation: No Ecchymosis: No Induration: No Erythema: No Rash: No Hemosiderin Staining: No Scarring: No Mottled: No Pallor: No Moisture Rubor: No No Abnormalities Noted: No Dry / Scaly: No Temperature / Pain Maceration: No Temperature: No Abnormality Tenderness on Palpation: Yes Wound Preparation Ulcer Cleansing: Rinsed/Irrigated with Saline Topical Anesthetic Applied: Other: lidocaine 4%, Electronic Signature(s) Signed: 10/26/2017 4:27:21 PM By: Alejandro Mulling Entered By: Alejandro Mulling on 10/26/2017 13:55:33 [name], [name] [name] ([id number]) -------------------------------------------------------------------------------- Wound Assessment Details Patient Name: [name] Date of Service: 10/26/2017 1:15 PM Medical Record Number: [id number] Patient Account Number: [id number] Date of Birth/Sex:  1945-10-10 (71 y.o. F) Treating RN: Phillis Haggis Primary Care Blandina Renaldo: [name] Other Clinician: Referring Darlyn Repsher: [name] Treating Shantal Roan/Extender: [name] [place] in Treatment: 19 Wound Status Wound Number: 2 Primary Arterial Insufficiency Ulcer Etiology: Wound Location: Right Toe - Web between 3rd and 4th Wound Open Wounding Event: Pressure Injury Status: Date Acquired: 02/21/2017 Comorbid Anemia, Arrhythmia, Congestive Heart Failure, Weeks Of Treatment: 18 History: Hypertension, Myocardial Infarction, Peripheral Clustered Wound: No Arterial Disease, Neuropathy Photos Photo Uploaded By: Alejandro Mulling on 10/26/2017 16:15:14 Wound Measurements Length: (cm) 0.5 Width: (cm) 0.5 Depth: (cm) 0.1 Area: (cm) 0.196 Volume: (cm) 0.02 % Reduction in Area: 87% % Reduction in Volume: 86.8% Epithelialization: None Tunneling: No Undermining: No Wound Description Classification: Partial Thickness Wound Margin: Flat and Intact Exudate Amount:  Large Exudate Type: Serous Exudate Color: amber Foul Odor After Cleansing: No Slough/Fibrino Yes Wound Bed Granulation Amount: None Present (0%) Exposed Structure Necrotic Amount: Large (67-100%) Fascia Exposed: No Necrotic Quality: Eschar, Adherent Slough Fat Layer (Subcutaneous Tissue) Exposed: No Tendon Exposed: No Muscle Exposed: No Joint Exposed: No Bone Exposed: No Periwound Skin Texture Texture Color No Abnormalities Noted: No No Abnormalities Noted: No Emond, Berniece G. ([id number]) Callus: No Atrophie Blanche: No Crepitus: No Cyanosis: Yes Excoriation: No Ecchymosis: No Induration: No Erythema: No Rash: No Hemosiderin Staining: No Scarring: No Mottled: No Pallor: No Moisture Rubor: No No Abnormalities Noted: No Dry / Scaly: No Temperature / Pain Maceration: No Temperature: No Abnormality Tenderness on Palpation: Yes Wound Preparation Ulcer Cleansing: Rinsed/Irrigated with Saline Topical Anesthetic Applied: Other: lidocaine 4%, Electronic Signature(s) Signed: 10/26/2017 4:27:21 PM By: Alejandro Mulling Entered By: Alejandro Mulling on 10/26/2017 13:54:28 Karwowski, [name] [name] ([id number]) -------------------------------------------------------------------------------- Wound Assessment Details Patient Name: [name] Date of Service: 10/26/2017 1:15 PM Medical Record Number: [id number] Patient Account Number: [id number] Date of Birth/Sex: 01-Apr-1946 (72 y.o. F) Treating RN: Phillis Haggis Primary Care Yanin Muhlestein: [name] Other Clinician: Referring Refujio Haymer: [name] Treating Tiawanna Luchsinger/Extender: [name] [place] in Treatment: 19 Wound Status Wound Number: 5 Primary Arterial Insufficiency Ulcer Etiology: Wound Location: Left Toe Great - Distal Wound Open Wounding Event: Gradually Appeared Status: Date Acquired: 07/17/2017 Comorbid Anemia, Arrhythmia, Congestive Heart Failure, Weeks Of Treatment: 14 History: Hypertension, Myocardial  Infarction, Peripheral Clustered Wound: No Arterial Disease, Neuropathy Photos Photo Uploaded By: Alejandro Mulling on 10/26/2017 16:15:52 Wound Measurements Length: (cm) 2.5 Width: (cm) 2.5 Depth: (cm) 0.1 Area: (cm) 4.909 Volume: (cm) 0.491 % Reduction in Area: -212.5% % Reduction in Volume: -2968.7% Epithelialization: None Tunneling: No Undermining: No Wound Description Classification: Partial Thickness Wound Margin: Flat and Intact Exudate Amount: None Present Foul Odor After Cleansing: No Slough/Fibrino Yes Wound Bed Granulation Amount: None Present (0%) Exposed Structure Necrotic Amount: Large (67-100%) Fascia Exposed: No Necrotic Quality: Eschar Fat Layer (Subcutaneous Tissue) Exposed: No Tendon Exposed: No Muscle Exposed: No Joint Exposed: No Bone Exposed: No Periwound Skin Texture Texture Color No Abnormalities Noted: No No Abnormalities Noted: No Callus: No Atrophie Blanche: No Crepitus: No Cyanosis: Yes Mcphearson, Rhesa G. ([id number]) Excoriation: No Ecchymosis: No Induration: No Erythema: No Rash: No Hemosiderin Staining: No Scarring: No Mottled: No Pallor: No Moisture Rubor: No No Abnormalities Noted: No Dry / Scaly: No Temperature / Pain Maceration: No Temperature: No Abnormality Tenderness on Palpation: Yes Wound Preparation Ulcer Cleansing: Rinsed/Irrigated with Saline Topical Anesthetic Applied: Other: lidocaine 4%, Electronic Signature(s) Signed: 10/26/2017 4:27:21 PM By: Alejandro Mulling Entered By: Alejandro Mulling on 10/26/2017 13:53:52 [name], [name] [name] ([id number]) -------------------------------------------------------------------------------- Wound Assessment Details Patient Name: Tia Masker  G. Date of Service: 10/26/2017 1:15 PM Medical Record Number: [id number] Patient Account Number: [id number] Date of Birth/Sex: 04-12-1946 (72 y.o. F) Treating RN: Phillis Haggis Primary Care Brok Stocking: [name] Other  Clinician: Referring Courtnie Brenes: [name] Treating Hollin Crewe/Extender: [name] [place] in Treatment: 19 Wound Status Wound Number: 6 Primary Arterial Insufficiency Ulcer Etiology: Wound Location: Left Toe Great - Lateral Wound Open Wounding Event: Gradually Appeared Status: Date Acquired: 07/17/2017 Comorbid Anemia, Arrhythmia, Congestive Heart Failure, Weeks Of Treatment: 14 History: Hypertension, Myocardial Infarction, Peripheral Clustered Wound: No Arterial Disease, Neuropathy Photos Photo Uploaded By: Alejandro Mulling on 10/26/2017 16:15:52 Wound Measurements Length: (cm) 1.3 Width: (cm) 0.5 Depth: (cm) 0.1 Area: (cm) 0.511 Volume: (cm) 0.051 % Reduction in Area: 83.6% % Reduction in Volume: 83.6% Epithelialization: None Tunneling: No Undermining: No Wound Description Classification: Partial Thickness Wound Margin: Flat and Intact Exudate Amount: Large Exudate Type: Serous Exudate Color: amber Foul Odor After Cleansing: No Slough/Fibrino Yes Wound Bed Granulation Amount: None Present (0%) Exposed Structure Necrotic Amount: Large (67-100%) Fascia Exposed: No Necrotic Quality: Eschar Fat Layer (Subcutaneous Tissue) Exposed: No Tendon Exposed: No Muscle Exposed: No Joint Exposed: No Bone Exposed: No Periwound Skin Texture Texture Color No Abnormalities Noted: No No Abnormalities Noted: No [name], [name] ([id number]) Callus: No Atrophie Blanche: No Crepitus: No Cyanosis: Yes Excoriation: No Ecchymosis: No Induration: No Erythema: No Rash: No Hemosiderin Staining: No Scarring: No Mottled: No Pallor: No Moisture Rubor: No No Abnormalities Noted: No Dry / Scaly: No Temperature / Pain Maceration: No Temperature: No Abnormality Tenderness on Palpation: Yes Wound Preparation Ulcer Cleansing: Rinsed/Irrigated with Saline Topical Anesthetic Applied: Other: lidocaine 4%, Electronic Signature(s) Signed: 10/26/2017 4:27:21 PM By:  Alejandro Mulling Entered By: Alejandro Mulling on 10/26/2017 13:52:58 [name], [name] [name] ([id number]) -------------------------------------------------------------------------------- Wound Assessment Details Patient Name: [name] Date of Service: 10/26/2017 1:15 PM Medical Record Number: [id number] Patient Account Number: [id number] Date of Birth/Sex: 13-May-1946 (72 y.o. F) Treating RN: Phillis Haggis Primary Care Yomayra Tate: [name] Other Clinician: Referring Meilin Brosh: [name] Treating Fuad Forget/Extender: [name]  in Treatment: 19 Wound Status Wound Number: 7 Primary Arterial Insufficiency Ulcer Etiology: Wound Location: Left Toe Third Wound Open Wounding Event: Gradually Appeared Status: Date Acquired: 07/16/2017 Comorbid Anemia, Arrhythmia, Congestive Heart Failure, Weeks Of Treatment: 14 History: Hypertension, Myocardial Infarction, Peripheral Clustered Wound: No Arterial Disease, Neuropathy Photos Photo Uploaded By: Alejandro Mulling on 10/26/2017 16:16:30 Wound Measurements Length: (cm) 1.3 Width: (cm) 1.7 Depth: (cm) 0.3 Area: (cm) 1.736 Volume: (cm) 0.521 % Reduction in Area: -130.2% % Reduction in Volume: -594.7% Epithelialization: None Tunneling: No Undermining: No Wound Description Classification: Partial Thickness Wound Margin: Flat and Intact Exudate Amount: Large Exudate Type: Purulent Exudate Color: yellow, brown, green Foul Odor After Cleansing: No Slough/Fibrino Yes Wound Bed Granulation Amount: None Present (0%) Exposed Structure Necrotic Amount: Large (67-100%) Fascia Exposed: No Necrotic Quality: Eschar, Adherent Slough Fat Layer (Subcutaneous Tissue) Exposed: No Tendon Exposed: No Muscle Exposed: No Joint Exposed: No Bone Exposed: No Periwound Skin Texture Texture Color No Abnormalities Noted: No No Abnormalities Noted: No [name], [name] ([id number]) Callus: No Atrophie Blanche: No Crepitus:  No Cyanosis: Yes Excoriation: No Ecchymosis: No Induration: No Erythema: No Rash: No Hemosiderin Staining: No Scarring: No Mottled: No Pallor: No Moisture Rubor: No No Abnormalities Noted: No Dry / Scaly: No Temperature / Pain Maceration: No Temperature: No Abnormality Tenderness on Palpation: Yes Wound Preparation Ulcer Cleansing: Rinsed/Irrigated with Saline Topical Anesthetic Applied: Other: lidocaine 4%, Electronic Signature(s) Signed: 10/26/2017 4:27:21 PM By:  Alejandro Mulling Entered By: Alejandro Mulling on 10/26/2017 13:51:43 Foell, [name] [name] ([id number]) -------------------------------------------------------------------------------- Vitals Details Patient Name: [name] Date of Service: 10/26/2017 1:15 PM Medical Record Number: [id number] Patient Account Number: [id number] Date of Birth/Sex: 1946-03-16 (72 y.o. F) Treating RN: [name] Primary Care Shunda Rabadi: [name] Other Clinician: Referring Tresha Muzio: [name] Treating Saed Hudlow/Extender: [name] [place] in Treatment: 19 Vital Signs Time Taken: 13:40 Temperature (F): 98.0 Height (in): 63 Pulse (bpm): 74 Weight (lbs): 148.7 Respiratory Rate (breaths/min): 18 Body Mass Index (BMI): 26.3 Blood Pressure (mmHg): 117/68 Reference Range: 80 - 120 mg / dl Electronic Signature(s) Signed: 10/27/2017 10:48:08 AM By: [name] RCP, RRT, CHT Entered By: [name] on 10/26/2017 13:42:45

## 2017-11-14 ENCOUNTER — Ambulatory Visit (INDEPENDENT_AMBULATORY_CARE_PROVIDER_SITE_OTHER): Payer: Self-pay | Admitting: Orthopedic Surgery

## 2017-11-15 ENCOUNTER — Telehealth: Payer: Self-pay | Admitting: Cardiovascular Disease

## 2017-11-15 NOTE — Telephone Encounter (Signed)
Patient states she has hard bumps on her legs and is wondering if this could be the stents she has.  Please call.

## 2017-11-15 NOTE — Telephone Encounter (Signed)
Spoke with patient and advised the knots are not her stents Confirmed follow up appointment date

## 2017-11-16 ENCOUNTER — Ambulatory Visit: Payer: Medicare Other | Admitting: Internal Medicine

## 2017-11-16 ENCOUNTER — Emergency Department (HOSPITAL_COMMUNITY)
Admission: EM | Admit: 2017-11-16 | Discharge: 2017-11-26 | Disposition: E | Payer: Medicare Other | Attending: Emergency Medicine | Admitting: Emergency Medicine

## 2017-11-16 ENCOUNTER — Encounter (HOSPITAL_COMMUNITY): Payer: Self-pay

## 2017-11-16 DIAGNOSIS — Z7902 Long term (current) use of antithrombotics/antiplatelets: Secondary | ICD-10-CM | POA: Insufficient documentation

## 2017-11-16 DIAGNOSIS — E118 Type 2 diabetes mellitus with unspecified complications: Secondary | ICD-10-CM | POA: Diagnosis not present

## 2017-11-16 DIAGNOSIS — I2510 Atherosclerotic heart disease of native coronary artery without angina pectoris: Secondary | ICD-10-CM | POA: Insufficient documentation

## 2017-11-16 DIAGNOSIS — E039 Hypothyroidism, unspecified: Secondary | ICD-10-CM | POA: Diagnosis not present

## 2017-11-16 DIAGNOSIS — R404 Transient alteration of awareness: Secondary | ICD-10-CM | POA: Diagnosis present

## 2017-11-16 DIAGNOSIS — Z955 Presence of coronary angioplasty implant and graft: Secondary | ICD-10-CM | POA: Insufficient documentation

## 2017-11-16 DIAGNOSIS — Z79899 Other long term (current) drug therapy: Secondary | ICD-10-CM | POA: Diagnosis not present

## 2017-11-16 DIAGNOSIS — I469 Cardiac arrest, cause unspecified: Secondary | ICD-10-CM | POA: Diagnosis not present

## 2017-11-16 DIAGNOSIS — Z87891 Personal history of nicotine dependence: Secondary | ICD-10-CM | POA: Diagnosis not present

## 2017-11-16 DIAGNOSIS — Z951 Presence of aortocoronary bypass graft: Secondary | ICD-10-CM | POA: Diagnosis not present

## 2017-11-16 MED ORDER — EPINEPHRINE PF 1 MG/10ML IJ SOSY
PREFILLED_SYRINGE | INTRAMUSCULAR | Status: AC | PRN
  Administered 2017-11-16 (×2): 1 mg via INTRAVENOUS

## 2017-11-16 MED ORDER — ATROPINE SULFATE 1 MG/ML IJ SOLN
INTRAMUSCULAR | Status: AC | PRN
  Administered 2017-11-16: 1 mg via INTRAVENOUS

## 2017-11-16 MED ORDER — EPINEPHRINE PF 1 MG/10ML IJ SOSY
PREFILLED_SYRINGE | INTRAMUSCULAR | Status: AC | PRN
Start: 2017-11-16 — End: 2017-11-16
  Administered 2017-11-16: 1 mg via INTRAVENOUS

## 2017-11-18 MED FILL — Medication: Qty: 1 | Status: AC

## 2017-11-23 ENCOUNTER — Ambulatory Visit: Payer: Medicare Other | Admitting: Internal Medicine

## 2017-11-26 NOTE — ED Notes (Signed)
cpr started at 1522.  Atropine given at 1521, epi given at 1522.

## 2017-11-26 NOTE — ED Notes (Signed)
Respiratory placing pt on ventilator.

## 2017-11-26 NOTE — Code Documentation (Signed)
Pulse palpated in r groin.  Cardiac activity seen on ultra sound.

## 2017-11-26 NOTE — ED Notes (Signed)
Pt has no heart rate at this time.  Family at bedside.

## 2017-11-26 NOTE — Progress Notes (Signed)
Patient was placed on ventilator PRVC RR 18, VT 450, FIO2 100% and PEEP 5. RN at bedside, family called in to be at bedside. Patient HR decreased and no pulse palpitated. CPR was resumed. After a pulse was noted patient was placed back on ventilator, the previous settings. Again HR was decreased and no pulse palpitated. Per family wishes no CPR was performed. Family at bedside, RT discontinued ventilation via SERVO I

## 2017-11-26 NOTE — ED Notes (Signed)
1 amp calcium given at 1456, 1 amp sodium bicarb given 1456.  No pulse at pulse check.  Chest compressions resumed.

## 2017-11-26 NOTE — ED Notes (Signed)
Heart rate in the 30's with a palpable femoral pulse.   Dr. Hyacinth Meeker called to bedside.  Family called to bedside.

## 2017-11-26 NOTE — ED Notes (Signed)
Waiting for family to come from [place].

## 2017-11-26 NOTE — ED Notes (Signed)
Dr Hyacinth Meeker removed king airway and intubated with size 7.5ett, secured at 24cm at the lip.  Positive color change on co2 detector.

## 2017-11-26 NOTE — ED Triage Notes (Addendum)
Pt arrived via ems in [place], cpr in progress.  EMS says pt c/o chest pain at home and husband gave nitro and pt went unresponsive.  EMS says pt was asystole on their arrival then PEA.  EMS says they had a pulse for approx 5-7 min then back in PEA.    EMS has given 8 epi via IO.  Pt has IO  In r leg. EMS placed king airway

## 2017-11-26 NOTE — ED Notes (Signed)
No pulse or cardiac activity at this time.  Time of death 1537.

## 2017-11-26 NOTE — Code Documentation (Signed)
Pt being bagged by respiratory.  EDP talking with family.  Pulse still palpable r groin.

## 2017-11-26 NOTE — ED Notes (Signed)
edp assessing cardiac activity with ultrasound, small amount of cardiac activity seen.  Assessing pulse.  Pulse palpable in r groin.

## 2017-11-26 NOTE — ED Provider Notes (Signed)
[facility] EMERGENCY DEPARTMENT Provider Note   CSN: [id number] Arrival date & time: 11-24-17  1451     History   Chief Complaint Chief Complaint  Patient presents with  . Cardiac Arrest    HPI [name] is a 72 y.o. female.  HPI  The patient is a 72 year old female, she has a history of multivessel coronary disease status post bypass grafting in 2003 as well as a history of fairly severe congestive heart failure which is combined systolic and diastolic with ejection fraction of approximately 20 to 25% as measured in October 2018.  She is followed by cardiology for her heart failure as well as her atrial fibrillation.  Additional history was obtained from family members when they arrived stating that the patient was not her usual self when she woke up this morning she was having "a bad day" however when the husband came home from being away from the house she said give me the nitroglycerin and call 911.  She then very quickly went unresponsive.  The patient arrives unresponsive getting CPR, intubated with a King airway, the patient is unable to participate in her history, level 5 caveat applies secondary to the severe acute nature of the patient's illness.  Resuscitative efforts started approximately 45 to 50 minutes prior to arrival.  She received an intraosseous line of the right proximal tibia and infused proximally 8 mg of epinephrine prehospital with no return of spontaneous circulation, she did have what appeared to be pulseless electrical activity on arrival.  Past Medical History:  Diagnosis Date  . Anemia 10/20/2017  . Asthma   . CAD (coronary artery disease)    Multivessel status post CABG in 2003  . Chronic combined systolic (congestive) and diastolic (congestive) heart failure (HCC) 12/25/2016  . Chronic systolic heart failure (HCC)   . Essential hypertension   . GERD (gastroesophageal reflux disease)   . Gum disease   . History of atrial flutter    Cardioversion  2016 in [place]  . History of colonic polyps   . History of goiter   . Hyperlipidemia   . Hypothyroidism   . Ischemic cardiomyopathy 12/25/2016  . Myocardial infarction (HCC)    2003  . Peripheral arterial disease (HCC)    Stent revascularization 2015 - details not clear  . Renal insufficiency   . Type 2 diabetes mellitus [facility])     Patient Active Problem List   Diagnosis Date Noted  . Anemia 10/20/2017  . Atherosclerosis of native arteries of the extremities with ulceration (HCC) 06/30/2017  . Critical lower limb ischemia 05/24/2017  . PAF (paroxysmal atrial fibrillation) (HCC)   . Abnormal stress test   . Chest pain 04/07/2017  . Elevated troponin 12/25/2016  . Ischemic cardiomyopathy 12/25/2016  . Chronic combined systolic (congestive) and diastolic (congestive) heart failure (HCC) 12/25/2016  . Atrial flutter by electrocardiogram (HCC)   . Acute on chronic systolic heart failure (HCC) 12/24/2016  . Paroxysmal atrial flutter (HCC) 12/24/2016  . Coronary artery disease 12/24/2016  . Hypothyroidism 12/24/2016  . GERD (gastroesophageal reflux disease) 12/24/2016  . Type 2 diabetes mellitus with other specified complication (HCC) 12/24/2016    Past Surgical History:  Procedure Laterality Date  . ABDOMINAL AORTOGRAM W/LOWER EXTREMITY Right 05/30/2017   Procedure: ABDOMINAL AORTOGRAM W/LOWER EXTREMITY;  Surgeon: Runell Gess, MD;  Location: [facility] INVASIVE CV LAB;  Service: Cardiovascular;  Laterality: Right;  . ABDOMINAL AORTOGRAM W/LOWER EXTREMITY N/A 07/14/2017   Procedure: ABDOMINAL AORTOGRAM W/LOWER EXTREMITY;  Surgeon: Imogene Burn,  Ottie Glazier, MD;  Location: [facility] INVASIVE CV LAB;  Service: Cardiovascular;  Laterality: N/A;  . CARDIAC CATHETERIZATION    . CARDIOVERSION N/A 04/12/2017   Procedure: CARDIOVERSION;  Surgeon: Dolores Patty, MD;  Location: [facility] ENDOSCOPY;  Service: Cardiovascular;  Laterality: N/A;  . CARDIOVERSION N/A 05/02/2017   Procedure: CARDIOVERSION;  Surgeon:  Dolores Patty, MD;  Location: [facility] ENDOSCOPY;  Service: Cardiovascular;  Laterality: N/A;  . CHOLECYSTECTOMY     2012  . CORONARY ARTERY BYPASS GRAFT     2003  . LOWER EXTREMITY ANGIOGRAPHY Left 08/01/2017   Procedure: LOWER EXTREMITY ANGIOGRAPHY;  Surgeon: Runell Gess, MD;  Location: [facility] INVASIVE CV LAB;  Service: Cardiovascular;  Laterality: Left;  . PARATHYROIDECTOMY     2015  . PERIPHERAL VASCULAR BALLOON ANGIOPLASTY Left 08/01/2017   Procedure: PERIPHERAL VASCULAR BALLOON ANGIOPLASTY;  Surgeon: Runell Gess, MD;  Location: [facility] INVASIVE CV LAB;  Service: Cardiovascular;  Laterality: Left;  attempted  . PERIPHERAL VASCULAR INTERVENTION Right 05/30/2017  . PERIPHERAL VASCULAR INTERVENTION Right 05/30/2017   Procedure: PERIPHERAL VASCULAR INTERVENTION;  Surgeon: Runell Gess, MD;  Location: [facility] INVASIVE CV LAB;  Service: Cardiovascular;  Laterality: Right;  . PERIPHERAL VASCULAR INTERVENTION Left 08/01/2017   Procedure: PERIPHERAL VASCULAR INTERVENTION;  Surgeon: Runell Gess, MD;  Location: [facility] INVASIVE CV LAB;  Service: Cardiovascular;  Laterality: Left;  Peroneal stent left  . RIGHT/LEFT HEART CATH AND CORONARY/GRAFT ANGIOGRAPHY N/A 04/08/2017   Procedure: RIGHT/LEFT HEART CATH AND CORONARY/GRAFT ANGIOGRAPHY;  Surgeon: Corky Crafts, MD;  Location: [facility] INVASIVE CV LAB;  Service: Cardiovascular;  Laterality: N/A;  . TEE WITHOUT CARDIOVERSION N/A 04/12/2017   Procedure: TRANSESOPHAGEAL ECHOCARDIOGRAM (TEE);  Surgeon: Dolores Patty, MD;  Location: [facility] ENDOSCOPY;  Service: Cardiovascular;  Laterality: N/A;  . THYROIDECTOMY    . TONSILLECTOMY     1967  . ULTRASOUND GUIDANCE FOR VASCULAR ACCESS  04/08/2017   Procedure: Ultrasound Guidance For Vascular Access;  Surgeon: Corky Crafts, MD;  Location: [facility] INVASIVE CV LAB;  Service: Cardiovascular;;  . ULTRASOUND GUIDANCE FOR VASCULAR ACCESS  07/14/2017   Procedure: Ultrasound Guidance For Vascular Access;  Surgeon:  Fransisco Hertz, MD;  Location: [facility] INVASIVE CV LAB;  Service: Cardiovascular;;     OB History   None      Home Medications    Prior to Admission medications   Medication Sig Start Date End Date Taking? Authorizing Provider  acetaminophen (TYLENOL) 500 MG tablet Take 1,000 mg by mouth daily as needed for moderate pain.     [provider]  albuterol (PROVENTIL HFA;VENTOLIN HFA) 108 (90 Base) MCG/ACT inhaler Inhale 2 puffs into the lungs every 6 (six) hours as needed for wheezing or shortness of breath. 09/09/17   Bensimhon, Bevelyn Buckles, MD  apixaban (ELIQUIS) 5 MG TABS tablet Take 1 tablet (5 mg total) by mouth 2 (two) times daily. 10/08/16 07/08/18  Jonelle Sidle, MD  carvedilol (COREG) 6.25 MG tablet Take 1 tablet (6.25 mg total) by mouth 2 (two) times daily. Patient taking differently: Take 3.25 mg by mouth 2 (two) times daily.  06/15/17   Clegg, Amy D, NP  clopidogrel (PLAVIX) 75 MG tablet Take 1 tablet (75 mg total) by mouth daily. 05/31/17   Georgie Chard D, NP  levothyroxine (SYNTHROID, LEVOTHROID) 175 MCG tablet Take 175 mcg by mouth daily before breakfast.    [provider]  losartan (COZAAR) 25 MG tablet Take 0.5 tablets (12.5 mg total) by mouth daily. 04/13/17  Clegg, Amy D, NP  Magnesium 250 MG TABS Take 250 mg by mouth daily.     [provider]  magnesium hydroxide (MILK OF MAGNESIA) 400 MG/5ML suspension Take 30 mLs by mouth daily as needed for mild constipation.    [provider]  nitroGLYCERIN (NITRODUR - DOSED IN MG/24 HR) 0.2 mg/hr patch PLACE 1 PATCH ONTO THE SKIN DAILY AS NEEDED FOR CHEST PAINS 12/23/16   Jonelle Sidle, MD  pantoprazole (PROTONIX) 40 MG tablet TAKE 1 TABLET(40 MG) BY MOUTH DAILY 10/05/17   Runell Gess, MD  potassium chloride (K-DUR,KLOR-CON) 10 MEQ tablet Take 2 tablets (20 mEq total) by mouth daily. 10/24/17   Jonelle Sidle, MD  torsemide (DEMADEX) 20 MG tablet Take 3 tablets (60 mg total) by mouth 2  (two) times daily. Patient taking differently: Take 40 mg by mouth daily.  09/08/17   Bensimhon, Bevelyn Buckles, MD    Family History Family History  Problem Relation Age of Onset  . Stroke Mother   . Heart attack Father   . Breast cancer Maternal Aunt   . Breast cancer Paternal Aunt     Social History Social History   Tobacco Use  . Smoking status: Former Smoker    Packs/day: 1.00    Years: 21.00    Pack years: 21.00    Types: Cigarettes    Last attempt to quit: 1988    Years since quitting: 31.4  . Smokeless tobacco: Never Used  Substance Use Topics  . Alcohol use: No  . Drug use: No     Allergies   Oxycodone; Pollen extract; Ranexa [ranolazine]; and Statins   Review of Systems Review of Systems  Unable to perform ROS: Acuity of condition     Physical Exam Updated Vital Signs Pulse (!) 58   Resp 20 Comment: bagged by respiratory  Physical Exam  Constitutional:  Ill-appearing, unresponsive  HENT:  Blood in the oropharynx, thick sputum in the oropharynx  Eyes:  Pupils dilated and unresponsive at approximately 4 to 5 mm  Cardiovascular:  No auscultated heart sounds, CPR being given, no pulses without CPR  Pulmonary/Chest:  No spontaneous respirations, requiring mechanical ventilation  Abdominal:  Soft abdomen, mildly distended, no masses palpated  Musculoskeletal:  Lateral symmetrical pitting edema of the lower extremities below the knees, several toes appear mummified  Neurological:  Attended, GCS of 3  Skin:  No obvious rashes, skin is pale and cool to touch     ED Treatments / Results  Labs (all labs ordered are listed, but only abnormal results are displayed) Labs Reviewed - No data to display  EKG None  Radiology No results found.  Procedures .Critical Care Performed by: Eber Hong, MD Authorized by: Eber Hong, MD   Critical care provider statement:    Critical care time (minutes):  35   Critical care time was exclusive of:   Separately billable procedures and treating other patients and teaching time   Critical care was necessary to treat or prevent imminent or life-threatening deterioration of the following conditions:  Cardiac failure   Critical care was time spent personally by me on the following activities:  Blood draw for specimens, development of treatment plan with patient or surrogate, discussions with consultants, evaluation of patient's response to treatment, examination of patient, obtaining history from patient or surrogate, ordering and performing treatments and interventions, ordering and review of laboratory studies, ordering and review of radiographic studies, pulse oximetry, re-evaluation of patient's condition and review of  old charts Procedure Name: Intubation Date/Time: 11-30-17 3:17 PM Performed by: Eber Hong, MD Pre-anesthesia Checklist: Patient identified, Patient being monitored, Emergency Drugs available, Timeout performed and Suction available Oxygen Delivery Method: Non-rebreather mask Preoxygenation: Pre-oxygenation with 100% oxygen Induction Type: Rapid sequence Ventilation: Mask ventilation without difficulty Laryngoscope Size: 4 Grade View: Grade II Tube size: 7.5 mm Number of attempts: 1 Airway Equipment and Method: Stylet Placement Confirmation: ETT inserted through vocal cords under direct vision,  CO2 detector and Breath sounds checked- equal and bilateral Secured at: 22 cm Tube secured with: ETT holder Dental Injury: Teeth and Oropharynx as per pre-operative assessment  Difficulty Due To: Difficulty was anticipated    IO LINE INSERTION Date/Time: 11-30-2017 3:18 PM Performed by: Eber Hong, MD Authorized by: Eber Hong, MD   Consent:    Consent obtained:  Emergent situation Anesthesia (see MAR for exact dosages):    Anesthesia method:  None Procedure details:    Insertion site:  L proximal tibia   Insertion device:  18 gauge IO needle and drill device    Insertion: Needle was inserted through the bony cortex     Number of attempts:  1   Insertion confirmation:  Easy infusion of fluids and stability of the needle Post-procedure details:    Secured with:  Protective shield and tape   Patient tolerance of procedure:  Tolerated well, no immediate complications   Cardiopulmonary Resuscitation (CPR) Procedure Note Directed/Performed by: Vida Roller I personally directed ancillary staff and/or performed CPR in an effort to regain return of spontaneous circulation and to maintain cardiac, neuro and systemic perfusion.     (including critical care time)  Medications Ordered in ED Medications  EPINEPHrine (ADRENALIN) 1 MG/10ML injection (1 mg Intravenous Given 11-30-17 1459)  atropine injection (1 mg Intravenous Given 2017/11/30 1521)  EPINEPHrine (ADRENALIN) 1 MG/10ML injection (1 mg Intravenous Given November 30, 2017 1522)     Initial Impression / Assessment and Plan / ED Course  I have reviewed the triage vital signs and the nursing notes.  Pertinent labs & imaging results that were available during my care of the patient were reviewed by me and considered in my medical decision making (see chart for details).    The patient arrives with what appears to be cardiac arrest, with her underlying history of cardiac disease it is not surprising and unfortunately has been resuscitated for over an hour with no return of spontaneous circulation.  I placed an intraosseous line in the left proximal tibia, intubated the patient with a formal endotracheal tube and directed CPR, after 2 mg's epinephrine the patient did have return of spontaneous circulation.  Bedside ultrasound reveals that she has minimal cardiac activity.  I discussed the patient's care with her family members who requested everything be done as they are days away from the 50th anniversary.  I have discussed with him the severe nature of her symptoms and that this is likely not survivable.  They  will be brought to the bedside to mourn  The patient had some intermittent return of spontaneous circulation with epinephrine though it was short-lived and she became progressively more bradycardic and unresponsive to medications.  Ultimately with the family at the bedside they were able to make the decision to withdraw care at 3:37 PM.  The patient was declared dead at that time.  Family is at the bedside morning with her pastor.    Final Clinical Impressions(s) / ED Diagnoses   Final diagnoses:  Cardiac arrest (HCC)  Eber Hong, MD Nov 24, 2017 1540

## 2017-11-26 NOTE — Code Documentation (Signed)
Continuing chest compressions.

## 2017-11-26 DEATH — deceased

## 2017-11-28 ENCOUNTER — Ambulatory Visit (INDEPENDENT_AMBULATORY_CARE_PROVIDER_SITE_OTHER): Payer: Self-pay | Admitting: Orthopedic Surgery

## 2017-12-21 ENCOUNTER — Inpatient Hospital Stay (HOSPITAL_COMMUNITY): Admission: RE | Admit: 2017-12-21 | Payer: Medicare Other | Source: Ambulatory Visit

## 2017-12-21 ENCOUNTER — Encounter (HOSPITAL_COMMUNITY): Payer: Medicare Other

## 2017-12-23 ENCOUNTER — Ambulatory Visit: Payer: Medicare Other | Admitting: Cardiovascular Disease

## 2017-12-23 ENCOUNTER — Encounter (HOSPITAL_COMMUNITY): Payer: Medicare Other | Admitting: Internal Medicine

## 2018-01-26 ENCOUNTER — Ambulatory Visit (INDEPENDENT_AMBULATORY_CARE_PROVIDER_SITE_OTHER): Payer: Medicare Other | Admitting: Internal Medicine

## 2018-02-06 ENCOUNTER — Ambulatory Visit: Payer: Medicare Other | Admitting: Cardiology

## 2018-02-23 ENCOUNTER — Encounter (HOSPITAL_COMMUNITY): Payer: Medicare Other

## 2018-11-20 IMAGING — US US EXTREM LOW DUPLEX ARTERIAL BILAT
2 series · 13 of 25 positions shown · non-contrast
Comparison: 09/02/2014 by report only

CLINICAL DATA: Peripheral arterial disease. Coronary artery
disease, hypertension, diabetes, previous tobacco abuse.

EXAM:
BILATERAL LOWER EXTREMITY ARTERIAL DUPLEX SCAN
TECHNIQUE: Gray-scale sonography as well as color Doppler and duplex ultrasound
was performed to evaluate the arteries of both lower extremities
including the common, superficial and profunda femoral arteries,
popliteal artery and calf arteries.

[Series 1: us extrem low duplex arterial bilat · 0.05mm/px · 12 of 97 slices shown (1 of 2)]
[im 1/97]
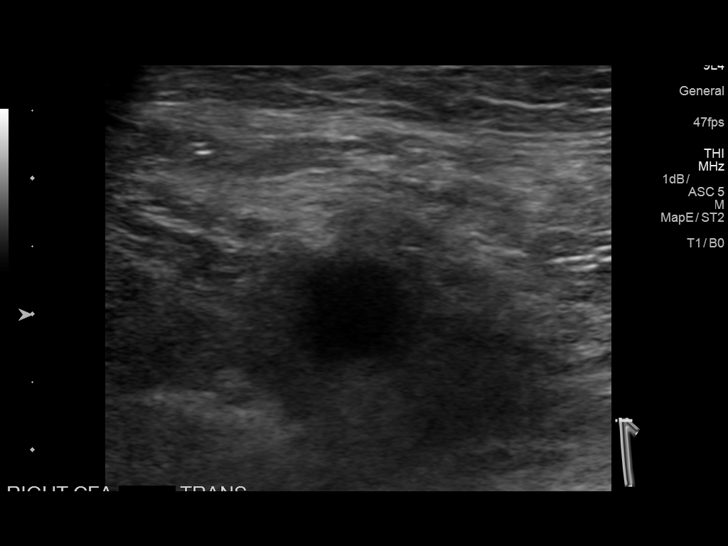
[im 9/97]
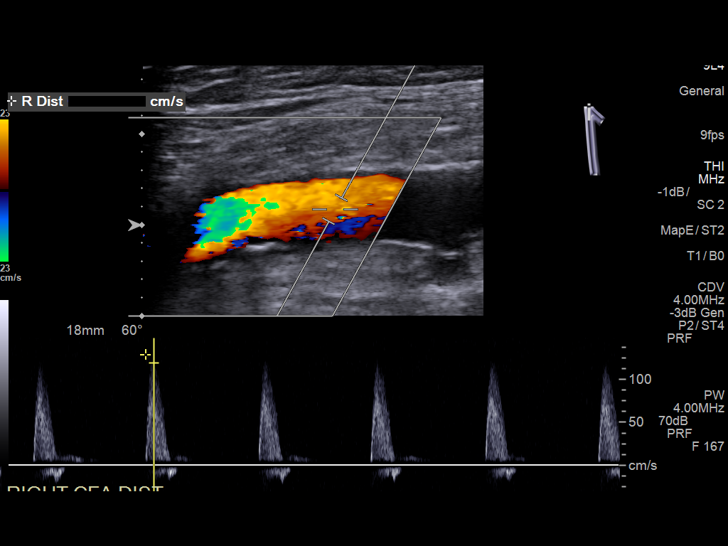
[im 17/97]
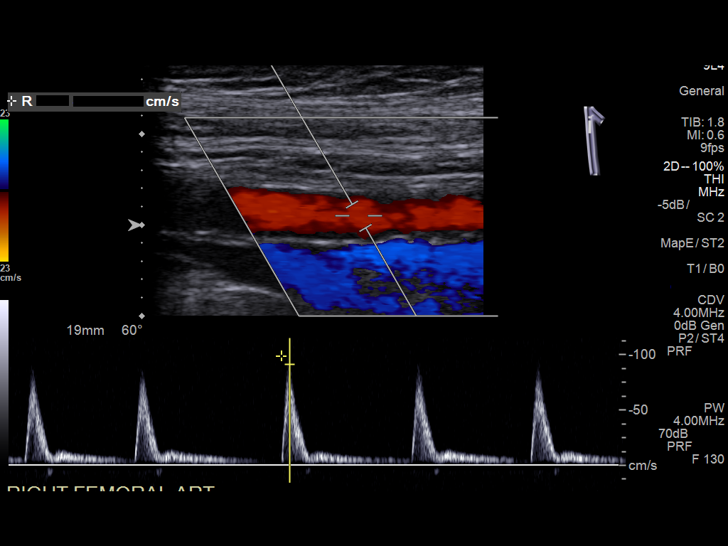
[im 26/97]
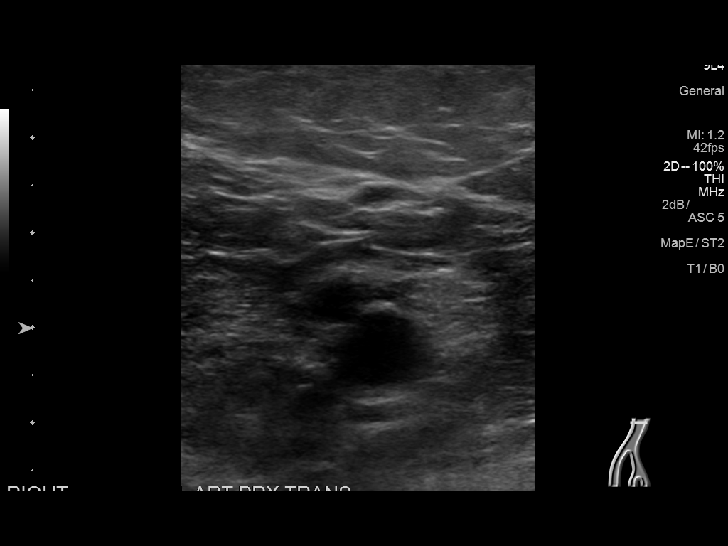
[im 34/97]
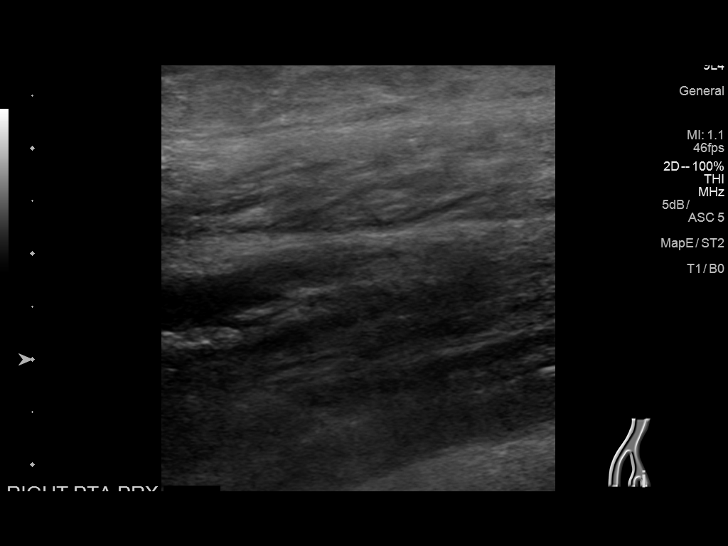
[im 42/97]
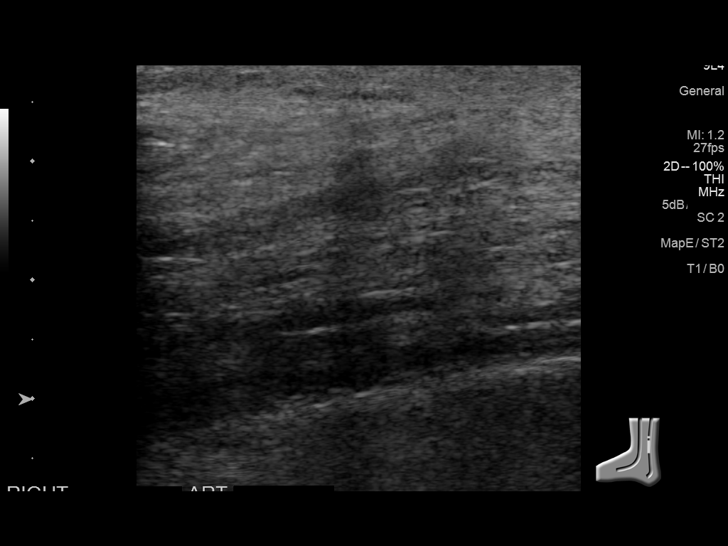
[im 51/97]
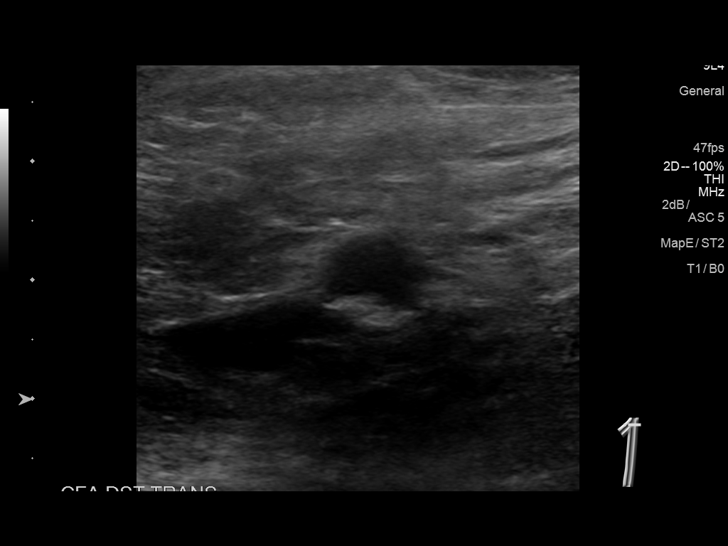
[im 59/97]
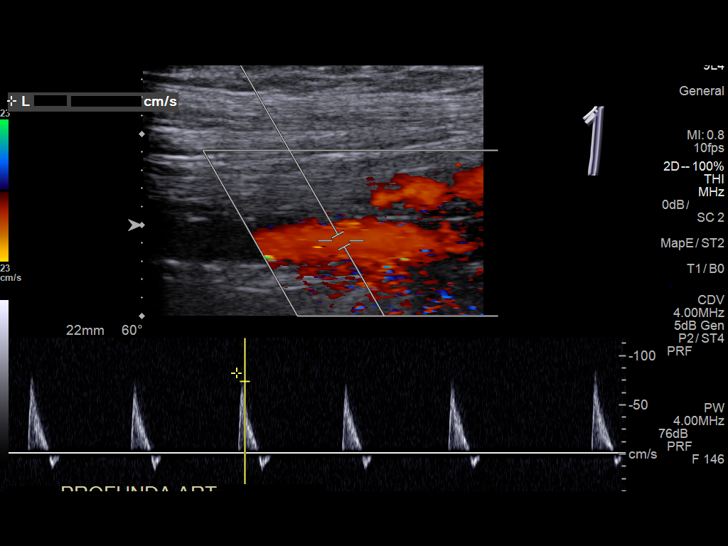
[im 67/97]
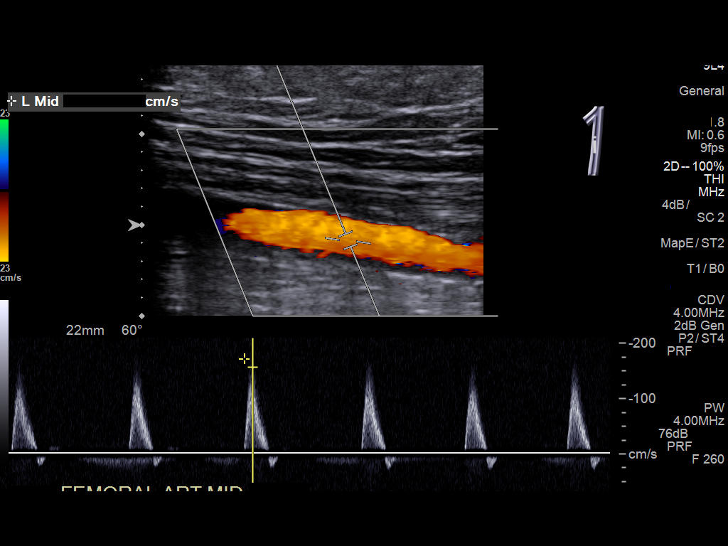
[im 76/97]
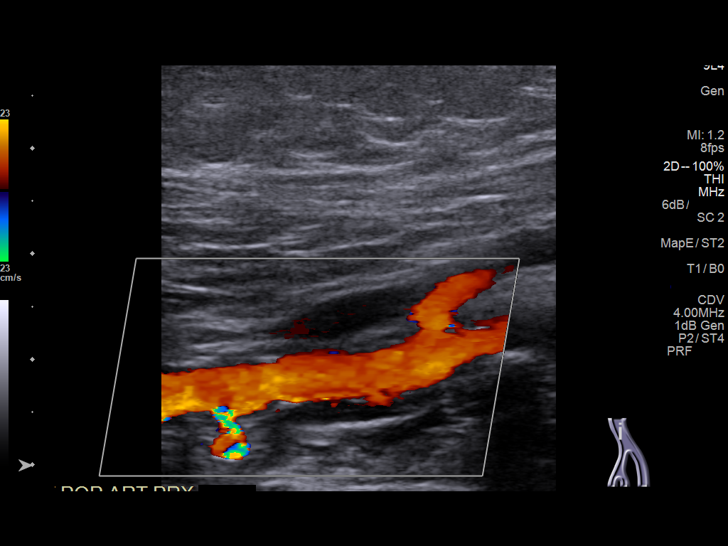
[im 84/97]
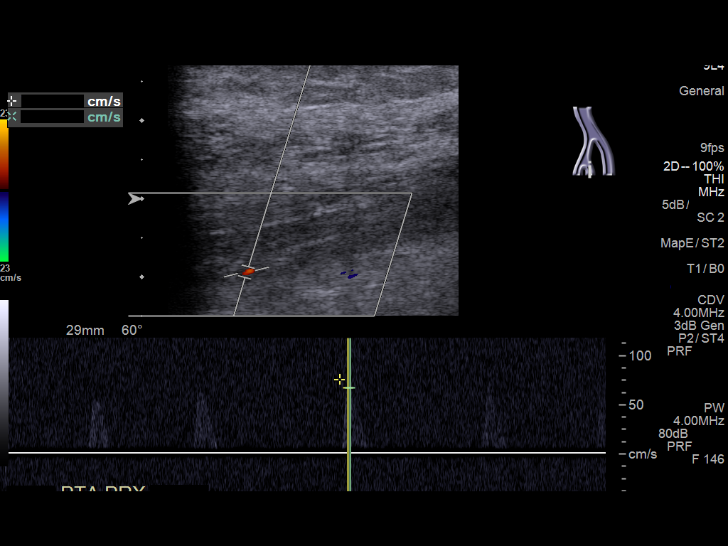
[im 92/97]
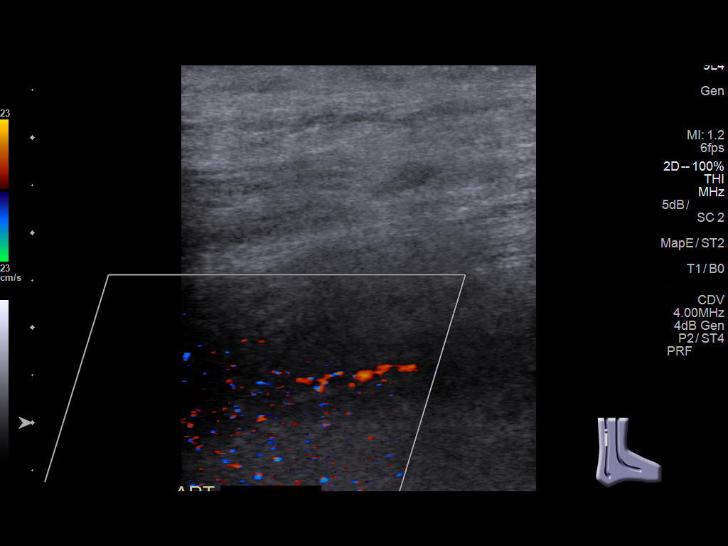

[Series 3: us extrem low duplex arterial bilat · 1 of 1 slices shown (2 of 2)]
[im 1/1]
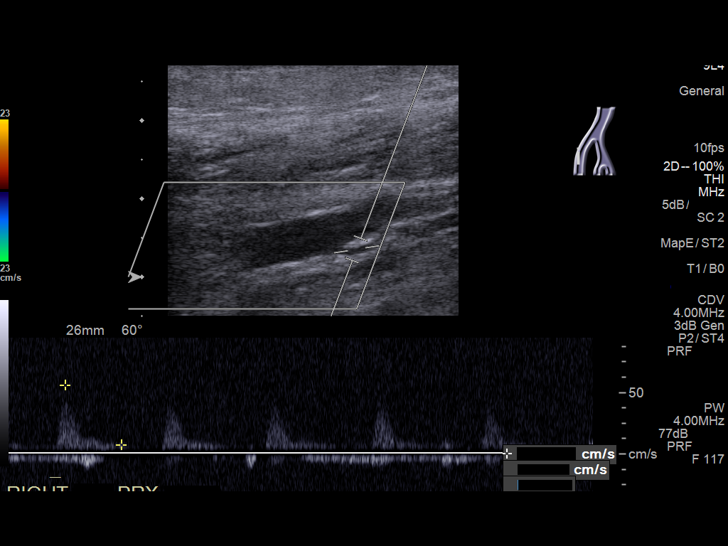

[13 of 25 positions shown; findings below may reference images not displayed]

FINDINGS: Right Lower Extremity

ABI: Not obtained

Inflow: Normal common femoral arterial waveforms and velocities. No
evidence of inflow (aortoiliac) disease.

Outflow: Eccentric calcified plaque in the common femoral artery.
Normal profunda femoral, superficial femoral arterial waveforms and
velocities. No focal elevation of the PSV to suggest stenosis.
Monophasic waveform in the popliteal artery.

Runoff: Monophasic waveforms. No definite occlusion on survey
interrogation. No focal aliasing on color Doppler.

Left Lower Extremity

ABI: Not obtained

Inflow: Normal common femoral arterial waveforms and velocities. No
evidence of inflow (aortoiliac) disease.

Outflow: Eccentric plaque through the common femoral artery.
Biphasic profunda femoral, superficial femoral and popliteal
arterial waveforms and velocities. Mild plaque throughout the
visualized popliteal artery. No focal elevation of the PSV to
suggest stenosis.

Runoff: Elevated peak systolic velocities in the proximal anterior
tibial artery. Monophasic waveforms in the tibial runoff. No
definite distal occlusion or stenosis.
IMPRESSION: 1. Abnormal monophasic waveforms in the right popliteal artery and
tibial runoff without focal obstructive lesion identified.
2. Monophasic waveforms in the left tibial runoff with high-grade
stenosis in the proximal anterior tibial artery.

## 2019-02-24 IMAGING — DX DG CHEST 2V
2 series · 2 of 2 positions shown · non-contrast
Comparison: None available

CLINICAL DATA: Chest pain and shortness of breath

EXAM:
CHEST  2 VIEW

[chest pa]
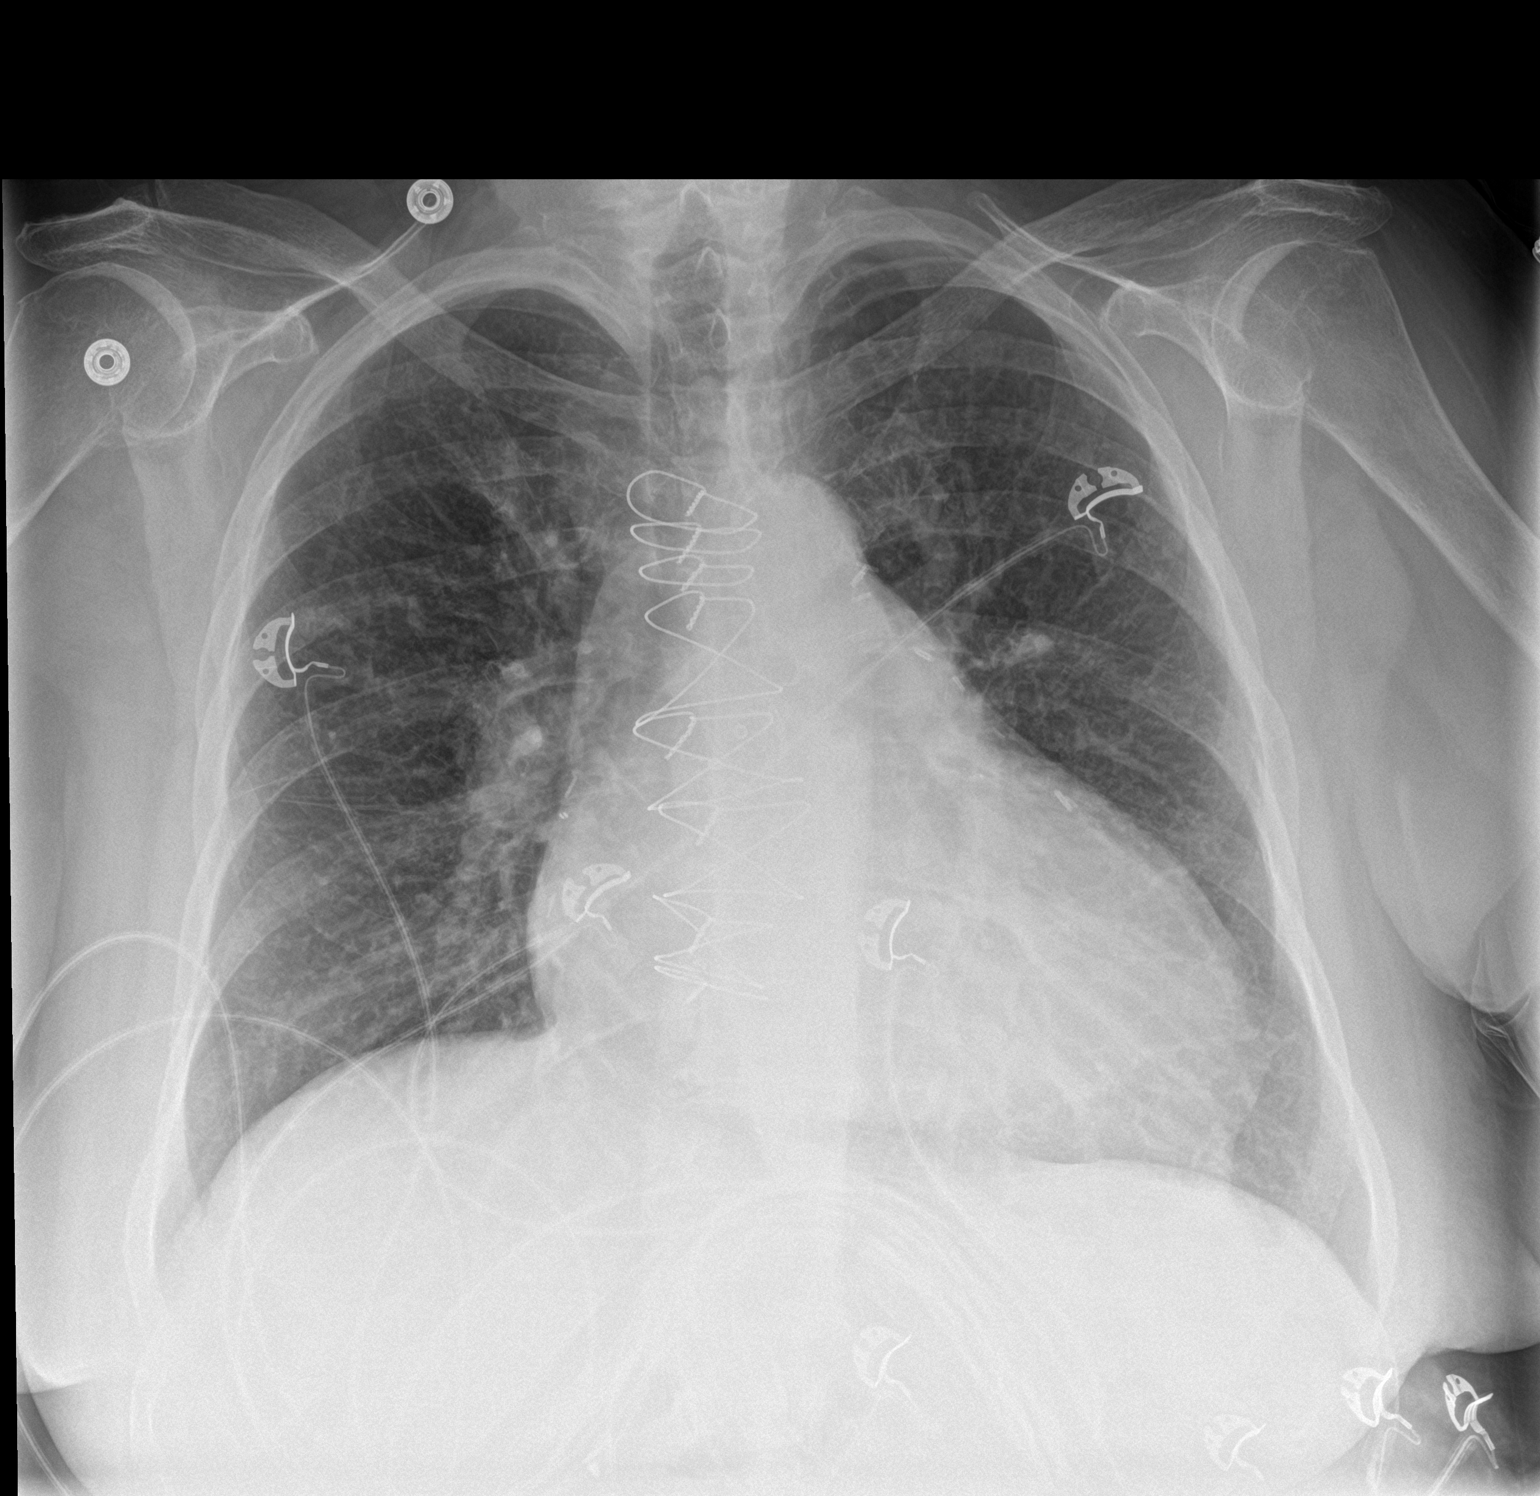

[chest lat]
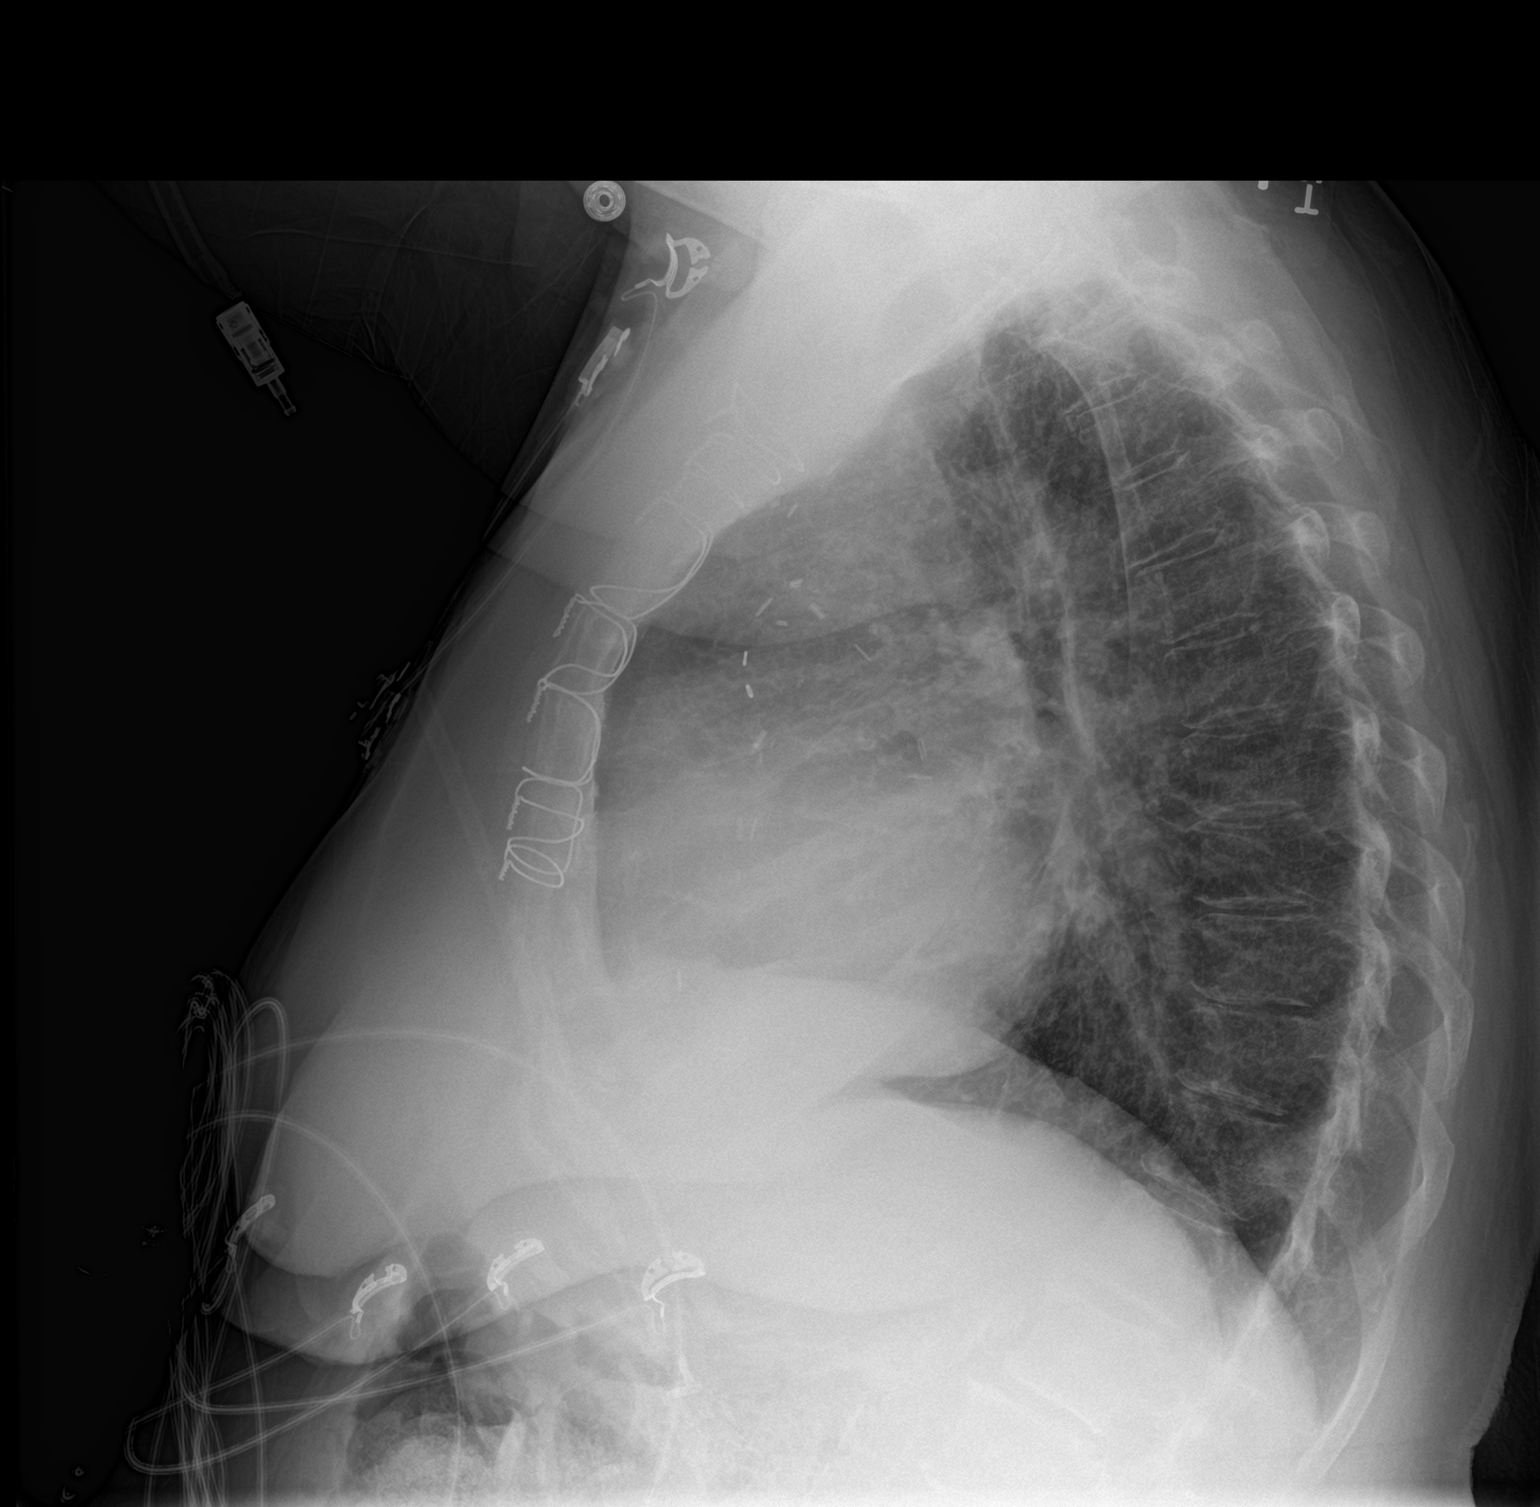

[2 of 2 positions shown; findings below may reference images not displayed]

FINDINGS: Remote median sternotomy. Moderate cardiomegaly with central
vascular congestion and chronic bronchitic change. No focal
pneumonia, collapse or consolidation. Negative for edema, effusion
or pneumothorax. Trachea is midline. Atherosclerosis of the aorta.
Degenerative changes of the spine. Bones are osteopenic.
IMPRESSION: Cardiomegaly with vascular congestion and chronic bronchitic change
centrally.

No superimposed CHF or pneumonia.

Thoracic aortic atherosclerosis

## 2019-06-08 IMAGING — CR DG CHEST 1V PORT
1 series · 1 of 1 positions shown · non-contrast
Comparison: Chest radiograph performed 12/24/2016

CLINICAL DATA: Acute onset of shortness of breath and cough. Left
lower posterior chest pain. Initial encounter.

EXAM:
PORTABLE CHEST 1 VIEW

[ap]
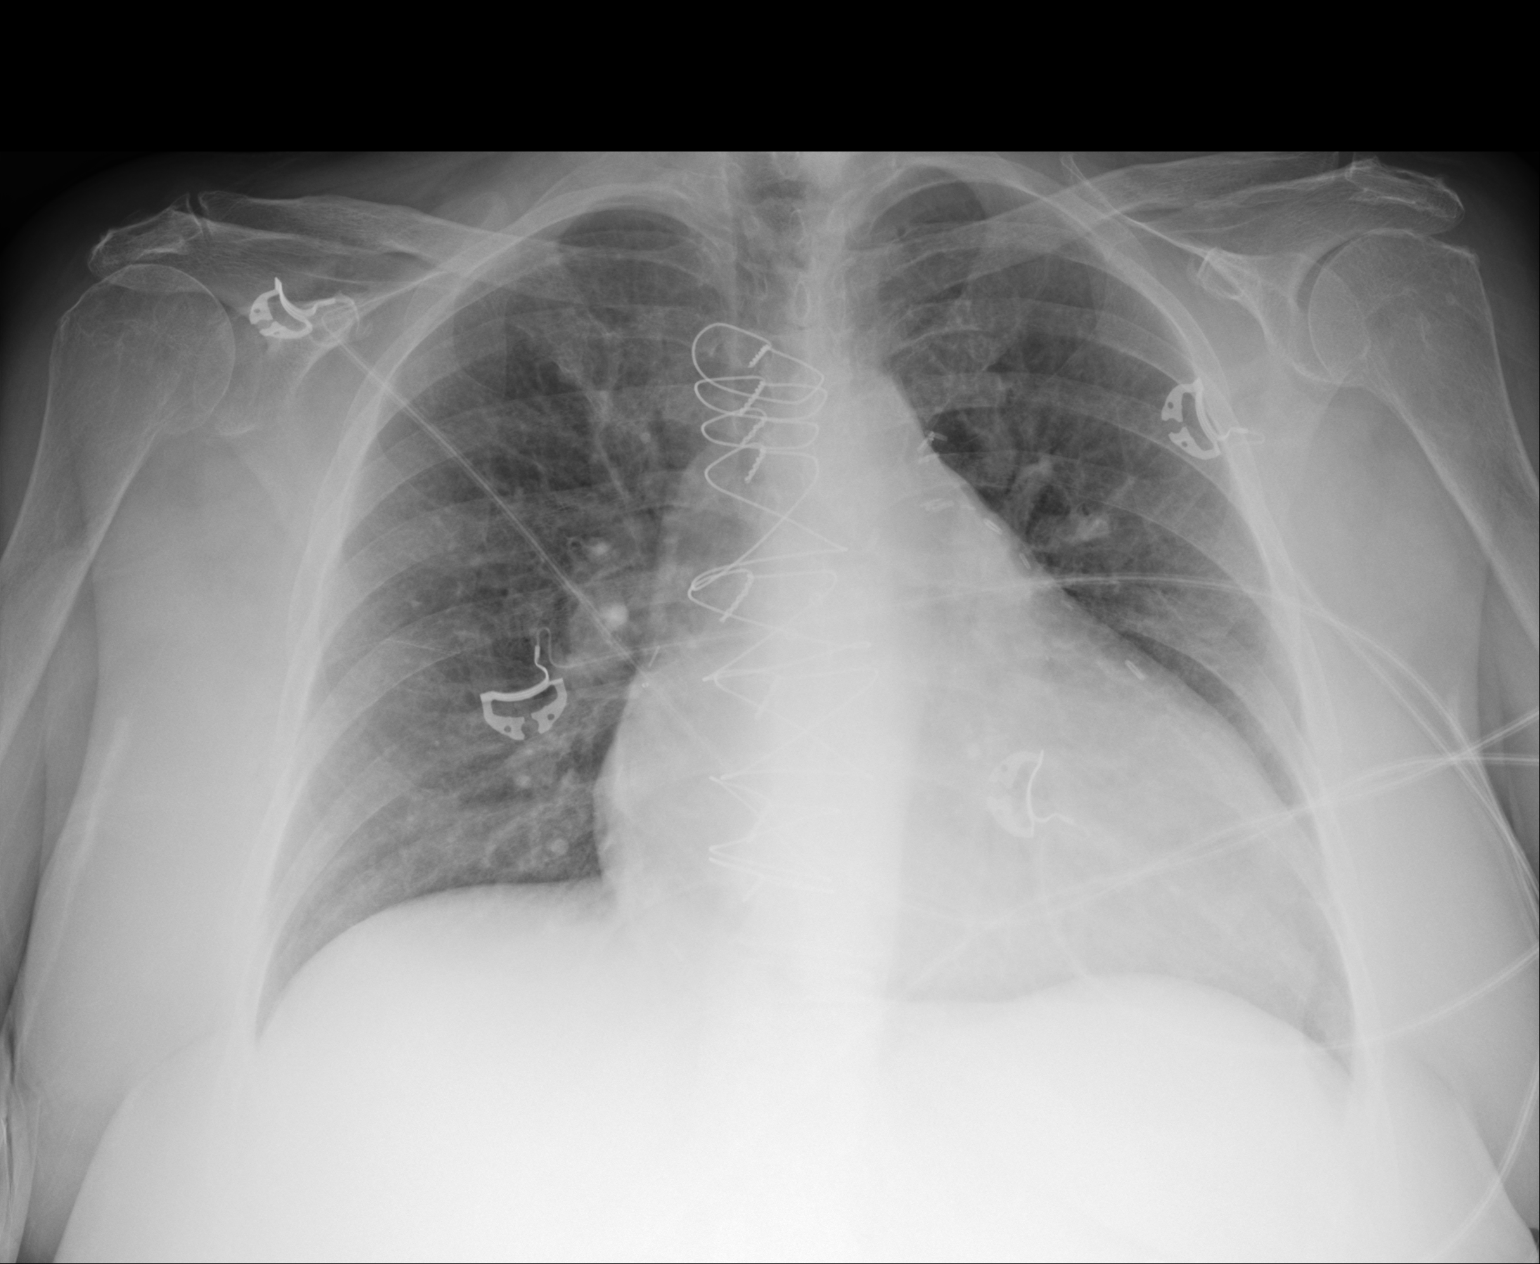

[1 of 1 positions shown; findings below may reference images not displayed]

FINDINGS: The lungs are well-aerated. Mild vascular congestion is noted. There
is no evidence of focal opacification, pleural effusion or
pneumothorax.

The cardiomediastinal silhouette is mildly enlarged. The patient is
status post median sternotomy. No acute osseous abnormalities are
seen.
IMPRESSION: Mild vascular congestion and mild cardiomegaly. Lungs remain grossly
clear.
